# Patient Record
Sex: Female | Born: 1965 | Race: White | Hispanic: No | Smoking: Never smoker
Health system: Southern US, Community
[De-identification: ages and names within clinical notes are randomized; demographics above are authoritative.]

## PROBLEM LIST (undated history)

## (undated) DIAGNOSIS — K760 Fatty (change of) liver, not elsewhere classified: Secondary | ICD-10-CM

## (undated) DIAGNOSIS — R112 Nausea with vomiting, unspecified: Secondary | ICD-10-CM

## (undated) DIAGNOSIS — M545 Low back pain, unspecified: Secondary | ICD-10-CM

## (undated) DIAGNOSIS — R259 Unspecified abnormal involuntary movements: Secondary | ICD-10-CM

## (undated) DIAGNOSIS — G609 Hereditary and idiopathic neuropathy, unspecified: Secondary | ICD-10-CM

## (undated) DIAGNOSIS — Z9889 Other specified postprocedural states: Secondary | ICD-10-CM

## (undated) DIAGNOSIS — M62838 Other muscle spasm: Secondary | ICD-10-CM

## (undated) DIAGNOSIS — I959 Hypotension, unspecified: Secondary | ICD-10-CM

## (undated) DIAGNOSIS — IMO0001 Reserved for inherently not codable concepts without codable children: Secondary | ICD-10-CM

## (undated) DIAGNOSIS — M810 Age-related osteoporosis without current pathological fracture: Secondary | ICD-10-CM

## (undated) DIAGNOSIS — K589 Irritable bowel syndrome without diarrhea: Secondary | ICD-10-CM

## (undated) DIAGNOSIS — I209 Angina pectoris, unspecified: Secondary | ICD-10-CM

## (undated) DIAGNOSIS — J309 Allergic rhinitis, unspecified: Secondary | ICD-10-CM

## (undated) DIAGNOSIS — K219 Gastro-esophageal reflux disease without esophagitis: Secondary | ICD-10-CM

## (undated) DIAGNOSIS — Z8619 Personal history of other infectious and parasitic diseases: Secondary | ICD-10-CM

## (undated) DIAGNOSIS — G459 Transient cerebral ischemic attack, unspecified: Secondary | ICD-10-CM

## (undated) DIAGNOSIS — I739 Peripheral vascular disease, unspecified: Secondary | ICD-10-CM

## (undated) DIAGNOSIS — R209 Unspecified disturbances of skin sensation: Secondary | ICD-10-CM

## (undated) DIAGNOSIS — R0602 Shortness of breath: Secondary | ICD-10-CM

## (undated) DIAGNOSIS — M349 Systemic sclerosis, unspecified: Secondary | ICD-10-CM

## (undated) DIAGNOSIS — M47812 Spondylosis without myelopathy or radiculopathy, cervical region: Secondary | ICD-10-CM

## (undated) DIAGNOSIS — G8929 Other chronic pain: Secondary | ICD-10-CM

## (undated) DIAGNOSIS — I498 Other specified cardiac arrhythmias: Secondary | ICD-10-CM

## (undated) DIAGNOSIS — F450 Somatization disorder: Secondary | ICD-10-CM

## (undated) DIAGNOSIS — R519 Headache, unspecified: Secondary | ICD-10-CM

## (undated) DIAGNOSIS — I201 Angina pectoris with documented spasm: Secondary | ICD-10-CM

## (undated) DIAGNOSIS — R5381 Other malaise: Secondary | ICD-10-CM

## (undated) DIAGNOSIS — R5383 Other fatigue: Secondary | ICD-10-CM

## (undated) DIAGNOSIS — F411 Generalized anxiety disorder: Secondary | ICD-10-CM

## (undated) HISTORY — DX: Low back pain, unspecified: M54.50

## (undated) HISTORY — DX: Somatization disorder: F45.0

## (undated) HISTORY — PX: CHOLECYSTECTOMY: SHX55

## (undated) HISTORY — DX: Gastro-esophageal reflux disease without esophagitis: K21.9

## (undated) HISTORY — DX: Allergic rhinitis, unspecified: J30.9

## (undated) HISTORY — PX: CATARACT EXTRACTION: SUR2

## (undated) HISTORY — PX: CARPAL TUNNEL RELEASE: SHX101

## (undated) HISTORY — DX: Unspecified disturbances of skin sensation: R20.9

## (undated) HISTORY — PX: OOPHORECTOMY: SHX86

## (undated) HISTORY — DX: Unspecified abnormal involuntary movements: R25.9

## (undated) HISTORY — DX: Other muscle spasm: M62.838

## (undated) HISTORY — DX: Low back pain: M54.5

## (undated) HISTORY — DX: Reserved for inherently not codable concepts without codable children: IMO0001

## (undated) HISTORY — PX: TUBAL LIGATION: SHX77

## (undated) HISTORY — PX: COLONOSCOPY: SHX174

## (undated) HISTORY — DX: Angina pectoris with documented spasm: I20.1

## (undated) HISTORY — PX: BACK SURGERY: SHX140

## (undated) HISTORY — DX: Other fatigue: R53.83

## (undated) HISTORY — DX: Generalized anxiety disorder: F41.1

## (undated) HISTORY — DX: Age-related osteoporosis without current pathological fracture: M81.0

## (undated) HISTORY — DX: Irritable bowel syndrome, unspecified: K58.9

## (undated) HISTORY — DX: Other specified cardiac arrhythmias: I49.8

## (undated) HISTORY — DX: Other malaise: R53.81

## (undated) HISTORY — DX: Spondylosis without myelopathy or radiculopathy, cervical region: M47.812

## (undated) HISTORY — DX: Personal history of other infectious and parasitic diseases: Z86.19

## (undated) HISTORY — DX: Hereditary and idiopathic neuropathy, unspecified: G60.9

## (undated) HISTORY — PX: CARDIAC CATHETERIZATION: SHX172

## (undated) HISTORY — DX: Systemic sclerosis, unspecified: M34.9

## (undated) HISTORY — DX: Angina pectoris, unspecified: I20.9

---

## 1995-11-06 HISTORY — PX: ABDOMINAL HYSTERECTOMY: SHX81

## 1998-11-11 ENCOUNTER — Other Ambulatory Visit: Admission: RE | Admit: 1998-11-11 | Discharge: 1998-11-11 | Payer: Self-pay | Admitting: Obstetrics and Gynecology

## 1999-04-18 ENCOUNTER — Encounter: Payer: Self-pay | Admitting: Obstetrics and Gynecology

## 1999-04-18 ENCOUNTER — Ambulatory Visit (HOSPITAL_COMMUNITY): Admission: RE | Admit: 1999-04-18 | Discharge: 1999-04-18 | Payer: Self-pay | Admitting: Obstetrics and Gynecology

## 1999-10-13 ENCOUNTER — Inpatient Hospital Stay (HOSPITAL_COMMUNITY): Admission: RE | Admit: 1999-10-13 | Discharge: 1999-10-15 | Payer: Self-pay | Admitting: Obstetrics and Gynecology

## 1999-10-13 ENCOUNTER — Encounter (INDEPENDENT_AMBULATORY_CARE_PROVIDER_SITE_OTHER): Payer: Self-pay | Admitting: Specialist

## 1999-10-24 ENCOUNTER — Ambulatory Visit (HOSPITAL_COMMUNITY): Admission: RE | Admit: 1999-10-24 | Discharge: 1999-10-24 | Payer: Self-pay | Admitting: Obstetrics and Gynecology

## 1999-10-24 ENCOUNTER — Encounter: Payer: Self-pay | Admitting: Obstetrics and Gynecology

## 1999-10-25 ENCOUNTER — Encounter: Payer: Self-pay | Admitting: Family Medicine

## 1999-10-25 ENCOUNTER — Encounter: Admission: RE | Admit: 1999-10-25 | Discharge: 1999-10-25 | Payer: Self-pay | Admitting: Family Medicine

## 1999-11-28 ENCOUNTER — Ambulatory Visit (HOSPITAL_COMMUNITY): Admission: RE | Admit: 1999-11-28 | Discharge: 1999-11-28 | Payer: Self-pay | Admitting: Obstetrics and Gynecology

## 1999-11-28 ENCOUNTER — Encounter: Payer: Self-pay | Admitting: Obstetrics and Gynecology

## 1999-12-06 ENCOUNTER — Inpatient Hospital Stay (HOSPITAL_COMMUNITY): Admission: RE | Admit: 1999-12-06 | Discharge: 1999-12-08 | Payer: Self-pay | Admitting: Obstetrics and Gynecology

## 1999-12-06 ENCOUNTER — Encounter (INDEPENDENT_AMBULATORY_CARE_PROVIDER_SITE_OTHER): Payer: Self-pay | Admitting: Specialist

## 2000-04-16 ENCOUNTER — Encounter: Admission: RE | Admit: 2000-04-16 | Discharge: 2000-04-16 | Payer: Self-pay | Admitting: Family Medicine

## 2000-04-16 ENCOUNTER — Encounter: Payer: Self-pay | Admitting: Family Medicine

## 2000-05-20 ENCOUNTER — Encounter: Admission: RE | Admit: 2000-05-20 | Discharge: 2000-05-20 | Payer: Self-pay | Admitting: Family Medicine

## 2000-05-20 ENCOUNTER — Encounter: Payer: Self-pay | Admitting: Family Medicine

## 2000-06-07 ENCOUNTER — Emergency Department (HOSPITAL_COMMUNITY): Admission: EM | Admit: 2000-06-07 | Discharge: 2000-06-07 | Payer: Self-pay | Admitting: Emergency Medicine

## 2000-06-14 ENCOUNTER — Encounter: Payer: Self-pay | Admitting: *Deleted

## 2000-06-14 ENCOUNTER — Encounter: Admission: RE | Admit: 2000-06-14 | Discharge: 2000-06-14 | Payer: Self-pay | Admitting: *Deleted

## 2000-06-17 ENCOUNTER — Emergency Department (HOSPITAL_COMMUNITY): Admission: EM | Admit: 2000-06-17 | Discharge: 2000-06-17 | Payer: Self-pay | Admitting: Emergency Medicine

## 2000-07-10 ENCOUNTER — Ambulatory Visit (HOSPITAL_COMMUNITY): Admission: RE | Admit: 2000-07-10 | Discharge: 2000-07-11 | Payer: Self-pay | Admitting: Neurosurgery

## 2000-07-10 ENCOUNTER — Encounter: Payer: Self-pay | Admitting: Neurosurgery

## 2000-11-05 HISTORY — PX: RECTOCELE REPAIR: SHX761

## 2000-11-05 HISTORY — PX: COLONOSCOPY: SHX174

## 2000-11-05 HISTORY — PX: CYSTOCELE REPAIR: SHX163

## 2001-07-03 ENCOUNTER — Encounter (INDEPENDENT_AMBULATORY_CARE_PROVIDER_SITE_OTHER): Payer: Self-pay | Admitting: *Deleted

## 2001-07-03 ENCOUNTER — Ambulatory Visit (HOSPITAL_COMMUNITY): Admission: RE | Admit: 2001-07-03 | Discharge: 2001-07-03 | Payer: Self-pay | Admitting: *Deleted

## 2001-08-01 ENCOUNTER — Ambulatory Visit: Admission: RE | Admit: 2001-08-01 | Discharge: 2001-08-01 | Payer: Self-pay | Admitting: Obstetrics and Gynecology

## 2001-08-08 ENCOUNTER — Ambulatory Visit (HOSPITAL_COMMUNITY): Admission: RE | Admit: 2001-08-08 | Discharge: 2001-08-08 | Payer: Self-pay | Admitting: Obstetrics and Gynecology

## 2001-08-08 ENCOUNTER — Encounter: Payer: Self-pay | Admitting: Obstetrics and Gynecology

## 2001-09-09 ENCOUNTER — Other Ambulatory Visit: Admission: RE | Admit: 2001-09-09 | Discharge: 2001-09-09 | Payer: Self-pay | Admitting: Obstetrics and Gynecology

## 2001-09-25 ENCOUNTER — Ambulatory Visit (HOSPITAL_COMMUNITY): Admission: RE | Admit: 2001-09-25 | Discharge: 2001-09-25 | Payer: Self-pay | Admitting: Neurosurgery

## 2001-09-25 ENCOUNTER — Encounter: Payer: Self-pay | Admitting: Neurosurgery

## 2001-10-16 ENCOUNTER — Inpatient Hospital Stay (HOSPITAL_COMMUNITY): Admission: RE | Admit: 2001-10-16 | Discharge: 2001-10-18 | Payer: Self-pay | Admitting: Obstetrics and Gynecology

## 2001-10-19 ENCOUNTER — Inpatient Hospital Stay (HOSPITAL_COMMUNITY): Admission: AD | Admit: 2001-10-19 | Discharge: 2001-10-19 | Payer: Self-pay | Admitting: Obstetrics and Gynecology

## 2001-10-21 ENCOUNTER — Inpatient Hospital Stay (HOSPITAL_COMMUNITY): Admission: AD | Admit: 2001-10-21 | Discharge: 2001-10-21 | Payer: Self-pay | Admitting: Obstetrics and Gynecology

## 2001-10-25 ENCOUNTER — Inpatient Hospital Stay (HOSPITAL_COMMUNITY): Admission: AD | Admit: 2001-10-25 | Discharge: 2001-10-25 | Payer: Self-pay | Admitting: Obstetrics and Gynecology

## 2002-11-19 ENCOUNTER — Other Ambulatory Visit: Admission: RE | Admit: 2002-11-19 | Discharge: 2002-11-19 | Payer: Self-pay | Admitting: Obstetrics and Gynecology

## 2003-11-06 HISTORY — PX: OTHER SURGICAL HISTORY: SHX169

## 2003-12-16 ENCOUNTER — Other Ambulatory Visit: Admission: RE | Admit: 2003-12-16 | Discharge: 2003-12-16 | Payer: Self-pay | Admitting: Obstetrics and Gynecology

## 2004-12-26 ENCOUNTER — Other Ambulatory Visit: Admission: RE | Admit: 2004-12-26 | Discharge: 2004-12-26 | Payer: Self-pay | Admitting: Obstetrics and Gynecology

## 2005-07-27 ENCOUNTER — Ambulatory Visit (HOSPITAL_COMMUNITY): Admission: RE | Admit: 2005-07-27 | Discharge: 2005-07-27 | Payer: Self-pay | Admitting: Obstetrics and Gynecology

## 2007-05-26 ENCOUNTER — Emergency Department (HOSPITAL_COMMUNITY): Admission: EM | Admit: 2007-05-26 | Discharge: 2007-05-26 | Payer: Self-pay | Admitting: Emergency Medicine

## 2007-06-04 ENCOUNTER — Emergency Department (HOSPITAL_COMMUNITY): Admission: EM | Admit: 2007-06-04 | Discharge: 2007-06-05 | Payer: Self-pay | Admitting: Emergency Medicine

## 2007-09-03 ENCOUNTER — Encounter: Admission: RE | Admit: 2007-09-03 | Discharge: 2007-09-03 | Payer: Self-pay | Admitting: Obstetrics and Gynecology

## 2007-10-25 ENCOUNTER — Emergency Department (HOSPITAL_COMMUNITY): Admission: EM | Admit: 2007-10-25 | Discharge: 2007-10-25 | Payer: Self-pay | Admitting: Emergency Medicine

## 2007-11-06 HISTORY — PX: COLONOSCOPY: SHX174

## 2008-07-26 ENCOUNTER — Encounter: Admission: RE | Admit: 2008-07-26 | Discharge: 2008-07-26 | Payer: Self-pay | Admitting: Otolaryngology

## 2008-08-12 ENCOUNTER — Ambulatory Visit (HOSPITAL_COMMUNITY): Admission: RE | Admit: 2008-08-12 | Discharge: 2008-08-12 | Payer: Self-pay | Admitting: Obstetrics and Gynecology

## 2008-08-23 ENCOUNTER — Encounter: Payer: Self-pay | Admitting: Gastroenterology

## 2008-08-26 ENCOUNTER — Ambulatory Visit: Payer: Self-pay | Admitting: Gastroenterology

## 2008-08-26 DIAGNOSIS — K625 Hemorrhage of anus and rectum: Secondary | ICD-10-CM

## 2008-08-26 DIAGNOSIS — D352 Benign neoplasm of pituitary gland: Secondary | ICD-10-CM

## 2008-08-26 DIAGNOSIS — N390 Urinary tract infection, site not specified: Secondary | ICD-10-CM

## 2008-08-26 DIAGNOSIS — D353 Benign neoplasm of craniopharyngeal duct: Secondary | ICD-10-CM

## 2008-08-26 LAB — CONVERTED CEMR LAB
Albumin: 4.3 g/dL (ref 3.5–5.2)
Basophils Absolute: 0 10*3/uL (ref 0.0–0.1)
Basophils Relative: 0.3 % (ref 0.0–3.0)
Bilirubin, Direct: 0.1 mg/dL (ref 0.0–0.3)
Calcium: 9.5 mg/dL (ref 8.4–10.5)
Creatinine, Ser: 0.8 mg/dL (ref 0.4–1.2)
Eosinophils Relative: 4.9 % (ref 0.0–5.0)
Ferritin: 88.9 ng/mL (ref 10.0–291.0)
Folate: >20 ng/mL
HCT: 41.9 % (ref 36.0–46.0)
Iron: 99 ug/dL (ref 42–145)
MCHC: 34.3 g/dL (ref 30.0–36.0)
MCV: 90.5 fL (ref 78.0–100.0)
Monocytes Relative: 9.1 % (ref 3.0–12.0)
Potassium: 3.9 meq/L (ref 3.5–5.1)
RDW: 12 % (ref 11.5–14.6)
Saturation Ratios: 30.3 % (ref 20.0–50.0)
Sed Rate: 10 mm/hr (ref 0–22)
Sodium: 142 meq/L (ref 135–145)
Transferrin: 233.1 mg/dL (ref >=212.0)
Vitamin B-12: 344 pg/mL (ref 211–911)

## 2008-08-27 ENCOUNTER — Ambulatory Visit: Payer: Self-pay | Admitting: Gastroenterology

## 2008-09-01 ENCOUNTER — Telehealth: Payer: Self-pay | Admitting: Gastroenterology

## 2008-11-05 HISTORY — PX: PITUITARY EXCISION: SHX745

## 2008-11-12 ENCOUNTER — Inpatient Hospital Stay (HOSPITAL_COMMUNITY): Admission: RE | Admit: 2008-11-12 | Discharge: 2008-11-15 | Payer: Self-pay | Admitting: Neurosurgery

## 2009-11-05 HISTORY — PX: BLADDER SURGERY: SHX569

## 2009-11-23 ENCOUNTER — Ambulatory Visit (HOSPITAL_COMMUNITY): Admission: RE | Admit: 2009-11-23 | Discharge: 2009-11-24 | Payer: Self-pay | Admitting: Specialist

## 2010-01-18 ENCOUNTER — Encounter: Admission: RE | Admit: 2010-01-18 | Discharge: 2010-01-18 | Payer: Self-pay | Admitting: Endocrinology

## 2010-03-29 ENCOUNTER — Encounter: Admission: RE | Admit: 2010-03-29 | Discharge: 2010-03-29 | Payer: Self-pay | Admitting: Neurosurgery

## 2010-08-02 ENCOUNTER — Encounter: Payer: Self-pay | Admitting: Cardiology

## 2010-08-14 ENCOUNTER — Encounter: Payer: Self-pay | Admitting: Cardiology

## 2010-08-16 ENCOUNTER — Encounter: Payer: Self-pay | Admitting: Cardiology

## 2010-09-04 ENCOUNTER — Encounter: Payer: Self-pay | Admitting: Cardiology

## 2010-09-06 ENCOUNTER — Emergency Department (HOSPITAL_COMMUNITY): Admission: EM | Admit: 2010-09-06 | Discharge: 2010-09-06 | Payer: Self-pay | Admitting: Emergency Medicine

## 2010-09-10 ENCOUNTER — Encounter: Admission: RE | Admit: 2010-09-10 | Discharge: 2010-09-10 | Payer: Self-pay | Admitting: Endocrinology

## 2010-10-11 ENCOUNTER — Encounter
Admission: RE | Admit: 2010-10-11 | Discharge: 2010-10-11 | Payer: Self-pay | Source: Home / Self Care | Admitting: Physical Therapy

## 2010-11-05 HISTORY — PX: CRANIECTOMY SUBOCCIPITAL W/ CERVICAL LAMINECTOMY / CHIARI: SUR327

## 2010-11-05 LAB — HM PAP SMEAR

## 2010-11-26 ENCOUNTER — Encounter: Payer: Self-pay | Admitting: Obstetrics and Gynecology

## 2010-11-26 ENCOUNTER — Encounter: Payer: Self-pay | Admitting: Endocrinology

## 2011-01-16 LAB — POCT CARDIAC MARKERS
CKMB, poc: <1 ng/mL — ABNORMAL LOW (ref 1.0–8.0)
Troponin i, poc: <0.05 ng/mL (ref 0.00–0.09)

## 2011-01-22 LAB — COMPREHENSIVE METABOLIC PANEL
ALT: 29 U/L (ref 0–35)
AST: 32 U/L (ref 0–37)
Albumin: 4.2 g/dL (ref 3.5–5.2)
Calcium: 9.8 mg/dL (ref 8.4–10.5)
Creatinine, Ser: 0.82 mg/dL (ref 0.4–1.2)
GFR calc non Af Amer: >60 mL/min (ref >60)
Total Bilirubin: 0.4 mg/dL (ref 0.3–1.2)

## 2011-01-22 LAB — POCT I-STAT 4, (NA,K, GLUC, HGB,HCT)
Hemoglobin: 13.6 g/dL (ref 12.0–15.0)
Potassium: 4.6 mEq/L (ref 3.5–5.1)
Sodium: 141 mEq/L (ref 135–145)

## 2011-02-19 LAB — DIFFERENTIAL
Lymphocytes Relative: 32 % (ref 12–46)
Monocytes Relative: 8 % (ref 3–12)
Neutrophils Relative %: 55 % (ref 43–77)

## 2011-02-19 LAB — CBC
Hemoglobin: 15 g/dL (ref 12.0–15.0)
Platelets: 321 10*3/uL (ref 150–400)
RBC: 4.99 MIL/uL (ref 3.87–5.11)
RDW: 12.6 % (ref 11.5–15.5)

## 2011-02-19 LAB — TYPE AND SCREEN
ABO/RH(D): A POS
Antibody Screen: NEGATIVE

## 2011-03-05 ENCOUNTER — Encounter: Payer: Self-pay | Admitting: Cardiology

## 2011-03-20 NOTE — Op Note (Signed)
NAMERUHEE, ENCK               ACCOUNT NO.:  [id number]   MEDICAL RECORD NO.:  [id number]          PATIENT TYPE:  INP   LOCATION:  3112                         FACILITY:  [facility]   PHYSICIAN:  Kathaleen Maser. Pool, M.D.    DATE OF BIRTH:  [birth year]   DATE OF PROCEDURE:  11/12/2008  DATE OF DISCHARGE:                               OPERATIVE REPORT   SERVICE:  Neurosurgery.   PREOPERATIVE DIAGNOSIS:  Chiari type 1 malformation.   POSTOPERATIVE DIAGNOSIS:  Chiari type 1 malformation.   PROCEDURE NAME:  Chiari decompression with suboccipital craniectomy and  C1 laminectomy with dural patch grafting.  Microdissection.   SURGEON:  Kathaleen Maser. Pool, MD   ASSISTANT:  Tia Alert, MD   ANESTHESIA:  General endotracheal.   INDICATION:  Ms. Ducey is a 45 year old female with history of headaches  and facial pain exacerbated by coughing, sneezing, or straining.  The  patient has some intermittent cranial nerve symptoms.  Workup has  demonstrated evidence of a radiologically significant Chiari I  malformation.  There is no evidence of syringobulbia or syringomyelia,  however.  The patient has no evidence of hydrocephalus.  The patient's  symptoms have been longstanding and debilitating.  She wishes for  something to be done.  We had a long discussion.  She decided to proceed  with the Chiari decompression, hopes improving her symptoms.   OPERATIVE NOTE:  The patient was placed in an operative room table in a  supine position.  After adequate level of anesthesia achieved, the  patient was then prone onto bolsters with her head fixed in a flexed  position in a Mayfield pin headrest.  The patient's posterior scalp and  upper cervical regions were prepped and draped sterilely.  A #10-blade  used to make a linear skin incision in her suboccipital region and upper  cervical region in the midline.  This was carried down sharply to the  suboccipital bone and the C1 lamina was also dissected free.   Deep self-  retainer was placed.  Suboccipital craniectomy was then performed using  the high-speed drill, Kerrison rongeurs, and Leksell rongeurs to remove  a section of the suboccipital bone extending down into the foramen  magnum.  A C1 laminectomy was also performed.  The dura was then opened  first in arachnoid sparing manner.  The dura was opened in a Y-shape  over both cerebellar hemispheres.  The arachnoid was then opened.  The  dura was held apart with tenting sutures.  Microdissection was then  performed exploring the inner cerebellar hemispheric space.  The floor  of the fourth ventricle and obex were identified.  There is no evidence  of any compression or abnormality.  Attention was then placed to sewing  in a dural patch graft.  A piece of bovine pericardium was cut and  fashioned to appropriate size.  This was then sutured in place using 4-0  Nurolon sutures in a running fashion.  Watertight closure was achieved.  Tisseel fibrin glue seal was then placed over the suture repair.  Gelfoam was placed over the  craniectomy  and laminectomy defect.  Wound was then closed in layers  with Vicryl sutures.  Skin was reapproximated with 3-0 nylon suture in a  running interlocking vertical mattress fashion.  There were no apparent  complications, the patient is well and she returns to recovery room.           ______________________________  Kathaleen Maser Pool, M.D.     HAP/MEDQ  D:  11/12/2008  T:  11/13/2008  Job:  [id number]

## 2011-03-23 NOTE — Op Note (Signed)
[facility]  Patient:    Virginia Shaw, Virginia Shaw I Visit Number: [id number] MRN: [id number]          Service Type: GYN Location: 9300 [id number] Attending Physician:  Soledad Gerlach Dictated by:   Guy Sandifer Arleta Creek, M.D. Proc. Date: 10/16/01 Admit Date:  10/16/2001                             Operative Report  PREOPERATIVE DIAGNOSIS:       Cystocele and rectocele.  POSTOPERATIVE DIAGNOSIS:      Cystocele and rectocele.  OPERATION:                    Anterior and posterior vaginal repair.  SURGEON:                      Guy Sandifer. Arleta Creek, M.D.  ASSISTANT:                    Marcelle Overlie, M.D.  ANESTHESIA:                   Spinal.  ESTIMATED BLOOD LOSS:         200 cc.  INDICATIONS AND CONSENT:      The patient is a 45 year old married white female status post TAH and subsequent BSO with symptomatic cystocele and rectocele.  Details have been dictated in the History and Physical.  Anterior and posterior vaginal repair has been discussed with the patient.  Potential risks and complications have been discussed including, but not limited to infection, bowel, bladder, or ureteral damage, bleeding requiring transfusion of blood products, and possible transfusion reaction of HIV and hepatitis acquisition, DVT, PE, pneumonia, fistula formation, postoperative dyspareunia, vaginal narrowing.  Questions have been answered and consent signed on the chart.  DESCRIPTION OF PROCEDURE:     The patient was taken to the operating room where a spinal anesthetic was placed.  She was then placed in the dorsolithotomy position where she was prepped, the bladder was straight catheterized, and she was draped in a sterile fashion.  The vaginal cuff was then grasped at each corner with Allis clamps and an inverted T-shaped incision was made to begin the anterior repair.  This was then carried up in the midline to a point 1-2 cm below the urethral meatus, and  the vaginal mucosa is then dissected bilaterally, sharply, and bluntly.  The urethovesical angle was then supported with Kelly plication sutures using 0 Monocryl.  Then, using a 0 Monocryl, a pursestring suture was used to reduce the cystocele, and then interrupted sutures are used to plicate the vesicovaginal fascia.  Excess mucosa is trimmed.  Figure-of-eight Vicryls are then used to reapproximate the superior aspect of the mucosa.  Then, a 2-0 running locking suture is used to close the remainder of the mucosa.  Next, a diamond-shaped wedge of tissue was removed from the posterior peritoneal body and the superficial fascia.  The posterior vaginal mucosa was then dissected from the underlying rectum in the midline and then carried out bilaterally sharply and bluntly.  The rectovaginal fascia is then reapproximated in the midline with interrupted sutures.  Excess mucosa was trimmed and the mucosa is reapproximated with a running locking 2-0 suture down to the point of the introitus.  The peritoneal body was then dissected and reapproximated with interrupted 0 sutures.  The 2-0 running locking stitch  was then used to continue closure of the mucosa, and then the skin of the peritoneum.  The vagina admits two examining fingers at the end of the case. A Foley catheter was then placed in the bladder and approximately 400 cc of clear urine is obtained.  The bladder is then filled retrograde with approximately 350 cc of saline.  Then, a suprapubic Bonnano catheter was placed using a syringe and a positive withdraw of saline both with and without the stylet.  This was then sutured in place with a permanent suture while the bladder was drained.  The Foley catheter was then removed.  One inch Iodoform gauze was then placed as a vaginal pack.  All counts were correct.  The patient was awakened and taken to the recovery room in stable condition. Dictated by:   Guy Sandifer Arleta Creek, M.D. Attending  Physician:  Soledad Gerlach DD:  10/16/01 TD:  10/16/01 Job: [id number]

## 2011-03-23 NOTE — H&P (Signed)
[facility]  Patient:    Virginia Shaw, Virginia Shaw Visit Number: [id number] MRN: [id number]          Service Type: Attending:  Guy Sandifer. Arleta Creek, M.D. Dictated by:   Guy Sandifer Arleta Creek, M.D. Adm. Date:  08/07/01                           History and Physical  CHIEF COMPLAINT:              Symptomatic rectocele and cystocele.  HISTORY OF PRESENT ILLNESS:   This patient is a 45 year old, white married female, status post TAH and subsequent BSO who complains of increasing difficulty with bowel movements. She has significant vaginal pressure making it difficult to empty her bowels. On examination, she has a small cystocele and a second degree rectocele. After attempts at conservative management, the patient requests surgical correction. She is being admitted for an anterior-posterior vaginal repair. Potential risks of the surgery have been reviewed with the patient and her husband, and all questions have been answered.  PAST MEDICAL HISTORY:         1. 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 Apparently, this is a                                  nonsecreting tumor with a normal prolactin                                  level of 7.                               2. Headaches and complaints of blurry vision                                  with normal ophthalmologic examination. The                                  patient is also seeing Zola Button T. Lazarus Salines, M.D.                                  for ENT evaluation who believes she has                                  allergic rhinitis controlled with Allegra.                               3. Chronic sinusitis. Gloris Manchester. [facility], M.D. as                                  above.  4. Constipation prone, irritable bowel syndrome                                  with Roosvelt Harps, M.D. She has chronic  constipation problems and chronic complaints                                  of abdominal bloating. Apparently, a negative                                  workup to date.                               5. Microscopic hematuria with normal IVP and                                  cystoscopy with Bertram Millard. Dahlstedt, M.D.  PAST SURGICAL HISTORY:        1. Status post TAH in 1997.                               2. 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                               3. Subsequent laparotomy with LSO in January of                                  2000.                               4. Diskectomy at L5 with Hewitt Shorts,                                  M.D. last year.  OBSTETRIC HISTORY:            Vaginal delivery x 2.  FAMILY HISTORY:               Multiple gestations in maternal grandmother. Chronic hypertension in father. Pulmonary disease paternal grandfather. Migraine headaches in sister.  CURRENT MEDICATIONS:          Dulcolax tablets.  ALLERGIES:                    CODEINE leading to rash.  REVIEW OF SYSTEMS:            The patient complains of pain in her extremities. She particularly complains of pain in her left calf. Doppler flow studies are pending at the time of dictation.  PHYSICAL EXAMINATION:  VITAL SIGNS:  Height 5 feet 1 inch, weight 121 pounds, blood pressure 96/64.  HEENT/NECK:                   Without thyromegaly.  LUNGS:                        Clear to auscultation.  HEART:                        Regular rate and rhythm.  BACK:                         Without CVA tenderness.  BREASTS:                      Without mass, tracks, or discharge.  ABDOMEN:                      Soft, nontender, without masses.  PELVIC:                       Vulva and vagina without lesion. First degree cystocele, second degree rectocele noted. No rectal masses.  Good sphincter tone.  EXTREMITIES:                  Mildly tender over the left calf. Right lower extremity nontender. No cords bilaterally. Negative Homans sign bilaterally. Normal dorsalis pedis and posterior tibial pulse in the left foot.  NEUROLOGICAL:                 Grossly within normal limits.  ASSESSMENT:                   Symptomatic cystocele, rectocele.  PLAN:                         Anterior-posterior vaginal repair with Guy Sandifer. Arleta Creek, M.D. Dictated by:   Guy Sandifer Arleta Creek, M.D. Attending:  Guy Sandifer Arleta Creek, M.D. DD:  07/31/01 TD:  07/31/01 Job: [id number]

## 2011-03-23 NOTE — Discharge Summary (Signed)
[facility]  Patient:    Virginia Shaw, Virginia Shaw I Visit Number: [id number] MRN: [id number]          Service Type: GYN Location: 9300 [id number] Attending Physician:  Soledad Gerlach Dictated by:   Guy Sandifer Arleta Creek, M.D. Admit Date:  10/16/2001 Disc. Date: 10/18/01                             Discharge Summary  ADMISSION DIAGNOSES:          1. Cystocele.                               2. Rectocele.  DISCHARGE DIAGNOSES:          1. Cystocele.                               2. Rectocele.  PROCEDURE:                    On October 16, 2001, anterior and posterior vaginal repair.  REASON FOR ADMISSION:         This patient is a 45 year old married white female, status post TAH and subsequent BSO with a symptomatic cystocele and rectocele.  Details are dictated in history and physical.  She is admitted for surgical management.  HOSPITAL COURSE:              She is admitted to the hospital and undergoes the above procedure without complications.  Estimated blood loss is 200 cc. On the evening of surgery, she has good pain control.  She had had some nausea and vomiting three times during the day, but it was resolved that evening. She was hungry.  Vital signs were stable and she was afebrile.  Urine output was clear.  Her examination was within normal limits.  The following morning, she noted she had a bowel movement three times the previous day.  However, she complained of vaginal fullness and requested a laxative.  No further nausea and vomiting.  She continued to have good pain relief and was up in a chair. She had not yet had a regular diet at that point.  Vital signs remained stable and she was afebrile.  Hemoglobin is 12.6.  Her vaginal packing was removed and her examination was within normal limits.  Possible discharge was discussed and instructions were given.  By the evening of October 17, 2001, she was noted to be ambulating and tolerating  regular diet.  She had complained of a lot of rectal pressure and had requested a laxative.  She was given magnesium citrate which produced good results. That did decrease the rectal pressure somewhat.  She complained of a lot of vulvar burning with the diarrhea.  Vital signs remained stable and she was afebrile.  Examination revealed the vulva to be healing well.  On the day of discharge, she is feeling much better with Dermaplast spray.  She is voiding only small amounts. She is tolerating a regular diet and ambulating well.  She has only scant vaginal bleeding.  Vital signs remain stable and she is afebrile.  CONDITION ON DISCHARGE:       Good.  DIET:  Regular as tolerated.  ACTIVITY:                     No lifting, no operation of automobiles, no vaginal entry.  She is to call the office for problems including, but not limited to, temperature of 101 or greater, increasingly heavier vaginal bleeding, increasing pain, persistent nausea and vomiting.  DISCHARGE MEDICATIONS:        1. Macrobid #10 one refill one p.o. b.i.d. while                                  catheter is in place.                               2. Phenergan 25 mg tablets #20 one p.o. q.6h.                                  p.r.n. with no refills.                               3. Percocet 5/325 mg #30 one to two p.o. q.6h.                                  p.r.n.                               4. Ibuprofen 600 mg p.o. q.6h. p.r.n.  LABORATORY DATA:              On October 15, 2001, WBC 6.2, hemoglobin 14.4, platelets 306,000.  On October 17, 2001, white count 7.1, hemoglobin 12.6, platelets 281,000.  A urine culture was sent in the early a.m. of October 18, 2001, for some complaints of burning.  The patient will be on Macrobid and the urine culture will be checked.  FOLLOW-UP:                    In the office in one week.  Careful instructions have been given on care of the suprapubic  Bonanno catheter. Dictated by:   Guy Sandifer Arleta Creek, M.D. Attending Physician:  Soledad Gerlach DD:  10/18/01 TD:  10/18/01 Job: [id number]

## 2011-03-23 NOTE — H&P (Signed)
[facility]  Patient:    Virginia Shaw, Virginia Shaw Visit Number: [id number] MRN: [id number]          Service Type: Attending:  Guy Sandifer. Arleta Creek, M.D. Dictated by:   Guy Sandifer Arleta Creek, M.D. Adm. Date:  10/16/01                           History and Physical  CHIEF COMPLAINT:              Symptomatic rectocele and cystocele.  HISTORY OF PRESENT ILLNESS:   This patient is a 45 year old married white female, status post TAH and subsequent BSO, with symptomatic cystocele and rectocele.  She was originally scheduled for surgery on August 07, 2001.  At that time, she complained of a Fleets enema escaping through the vagina while in the preoperative holding area.  Therefore, her surgery was canceled to investigate the possibility of a rectovaginal fistula.  Evaluation has included a pelvic CT which was negative for evidence of fistula.  The patient also underwent colposcopy in the office, which was also unable to identify a rectovaginal fistula.  Finally, rectovaginal examination does not reveal evidence of a rectovaginal fistula.  However, the patient continues to complain of increasing difficulty with her bowel movements.  She has significant vaginal pressure when trying to empty her bowels.  She uses Dulcolax tablets on a daily basis.  After attempts at conservative management, the patient requests surgical correction.  She is being admitted for anterior posterior vaginal repair.  PAST MEDICAL HISTORY:         1.  Pituitary microadenoma, evaluated by                                   Dr. Shirlean Kelly.  Apparently, this is a                                   nonsecreting tumor with a normal prolactin                                   level of 7.                               2.  Headaches and complaints of blurry vision,                                   with normal ophthalmologic examination.  The                                   patient sees Dr. Zola Button  T. [facility] for ENT                                   evaluation, who is treating it as allergic  rhinitis.                               3.  Chronic sinusitis, treated by Dr. Lazarus Salines.                               4.  Constipation prone with irritable bowel                                   syndrome, treated by Dr. Roosvelt Harps.                                   She has numerous complaints of abdominal                                   bloating with an apparent negative GI workup                                   to date.                               5.  Microscopic hematuria with normal IVP and                                   cystoscopy with Dr. Marcine Matar.  PAST SURGICAL HISTORY:        1.  Total abdominal hysterectomy in 1997.                               2.  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                               3.  Subsequent laparotomy with LSO in                                   January 2000.                               4.  Diskectomy at L5 with Dr. Shirlean Kelly                                   last year.  OBSTETRIC HISTORY:            Vaginal delivery x 2.  FAMILY HISTORY:               Multiple gestations in maternal grandmother, chronic hypertension in father,  pulmonary disease in paternal grandfather, migraine headaches in sister.  MEDICATIONS:                  Dulcolax suppository daily.  ALLERGIES:                    1.  CODEINE leading to tachycardia and feeling                                   bad.                               2.  CIPRO leading to headache and tachycardia.  REVIEW OF SYSTEMS:            Prior to her last scheduled admission of August 07, 2001, the patient was complaining of pain in the left calf. Doppler flow study on August 01, 2001, of the left leg was within normal limits.  Patient subsequently has not  complained of this to me.  PHYSICAL EXAMINATION:  VITAL SIGNS:                  Height 5 feet 1 inch, weight 121 pounds.  Blood pressure 106/62.  HEENT:                        Without thyromegaly.  LUNGS:                        Clear to auscultation.  HEART:                        Regular rate and rhythm.  BACK:                         Without CVA tenderness.  BREASTS:                      Without mass, retractions, or discharge.  ABDOMEN:                      Soft, nontender, without masses.  PELVIC EXAM:                  Vulva and vagina without lesions.  There was a first degree cystocele and second degree rectocele.  RECTAL EXAM:                  No rectal masses.  Good sphincter tone of the rectum.  EXTREMITIES:                  Nontender, without cords and negative Homans sign bilaterally.  NEUROLOGICAL:                 Grossly within normal limits.  ASSESSMENT:                   Symptomatic cystocele and rectocele.  PLAN:                         Anterior-posterior vaginal repair. Dictated by:   Guy Sandifer Arleta Creek, M.D. Attending:  Guy Sandifer Arleta Creek, M.D. DD:  10/15/01 TD:  10/15/01 Job: [id number]

## 2011-03-23 NOTE — Op Note (Signed)
[place]  Patient:    Virginia Shaw, Virginia Shaw                      MRN: [id number] Proc. Date: 07/10/00 Adm. Date:  [id number] Attending:  Barton Fanny Dictator:   Hewitt Shorts, M.D.                           Operative Report  PREOPERATIVE DIAGNOSIS: Right alpha S1 lumbar disc herniation.  POSTOPERATIVE DIAGNOSIS: Same.  PROCEDURE:  Right alpha S1 lumbar laminotomy and right discectomy with microdissection.  SURGEON:  Hewitt Shorts, M.D.  ASSISTANT:  None.  ANESTHESIA: General endotracheal.  INDICATIONS:  Patient is a 45 year old woman who presented with an acute right lumbar radiculopathy and was found by MRI to have a large right L5-S1 lumbar disc herniation.  The decision was made to proceed with elective laminotomy and microdiskectomy.  PROCEDURE:  Patient was brought to the operating room, ______.  Patient was turned to the prone position and the lumbar region was prepped with ______ solution and draped in a sterile fashion.  Localization was taken and the L5-S1 level identified and midline ______ with epinephrine and midline incisions were made, carried down through the subcutaneous tissue.  Bipolar electrocautery were used to maintain hemostasis.  Dissection was carried down to the lumbar fascia, which was ______ of the midline ______ subperiosteal fashion.  The L5-S1 intralaminar space was identified and x-ray was taken to confirm the localization.  Then the laminotomy was performed using ______ and the ligamentum flavum was removed and we were able to identify the thecal sac and right S1 nerve roots.  These were gently retracted medially exposing a large disc herniation.  Overlying veins were coagulated and incised, then the free fragment was mobilized and removed and good decompression of the thecal sac and nerve root was achieved.  We then examined the disc, which was degenerated, but there was no further bulging  or compression.  There was no obvious rent in the annulus and therefore, it was felt that no further discectomy would benefit the patient.  The fragment had been removed and good decompression achieved.  We then established hemostasis with the use of bipolar cautery and then proceeded with closure.  Prior to closure, 2cc of Fentanyl and 80 mg of ______ were infused into the epidural space.  We then closed the deep fascia with interrupted ______ sutures.  The subcutaneous tissue were closed with interrupted inverted ______.  Patient tolerated the procedure well.  The estimated blood loss was less than 25cc.  ______ were correct.  Following surgery, the patient was to turned back to supine position ______ and transferred to the recovery room for further care. DD:  07/10/00 TD:  07/10/00 Job: [id number]

## 2011-03-23 NOTE — Discharge Summary (Signed)
Virginia Shaw, Virginia Shaw               ACCOUNT NO.:  [id number]   MEDICAL RECORD NO.:  [id number]          PATIENT TYPE:  INP   LOCATION:  3001                         FACILITY:  [facility]   PHYSICIAN:  Kathaleen Maser. Pool, M.D.    DATE OF BIRTH:  Mar 28, 1966   DATE OF ADMISSION:  11/12/2008  DATE OF DISCHARGE:  11/15/2008                               DISCHARGE SUMMARY   FINAL DIAGNOSIS:  Type 1 Chiari malformation.   HISTORY OF PRESENT ILLNESS:  [name] is a 45 year old female with a  symptomatic type 1 Chiari malformation.  She presents now for  decompression.   HOSPITAL COURSE:  The patient taken to operating room where an  uncomplicated Chiari decompression was performed.  Postoperatively, the  patient did well.  Headaches and facial symptoms had resolved.  Wound  was healing well.  The patient was rapidly mobilized.   CONDITION AT DISCHARGE:  Improved.   DISCHARGE FOLLOWUP:  The patient will follow up in my office in 1 week.           ______________________________  Kathaleen Maser. Pool, M.D.     HAP/MEDQ  D:  01/04/2009  T:  01/05/2009  Job:  [id number]

## 2011-03-23 NOTE — H&P (Signed)
[place]. [facility]  Patient:    Virginia Shaw, Virginia Shaw                      MRN: [id number] Adm. Date:  [id number] Attending:  Barton Fanny                         History and Physical  HISTORY OF PRESENT ILLNESS:  The patient is a 45 year old, right-handed white female who was evaluated for right lumbar radiculopathy secondary to a central right L5-S1 disk herniation.  The patient began to have difficulty with low back pain in October 2000.  She underwent a number of pelvic surgeries, first undergoing a right oophorectomy for a cyst and benign tumor, and then a left oophorectomy for a cyst.  It was suspected that her low back pain might be arising from these difficulties, but subsequent to her surgeries, she continued to have low back pain, and the pain worsened over the past couple of months, with pain beginning to radiate down to the right buttock, posterior thigh, and into the groin.  She does not describe specific numbness or tingling in a radicular pattern, but instead describes numbness and tingling in her arms, legs, and around her mouth, including her lips.  She also describes some chronic discomfort in her lower abdomen and pelvic region.  She describes discomfort and pressure, and is currently undergoing an evaluation with a couple of gynecologists.  She does not describe any history of weakness, although she finds it hard to use her right lower extremity due to pain and discomfort, such as even with standing.  An MRI scan was done which revealed a moderately-large central to right L5-S1 disk herniation.  PAST MEDICAL HISTORY:  She does not describe any history of hypertension, myocardial infarction, cancer, stroke, diabetes mellitus, peptic ulcer disease, or lung disease.  PAST SURGICAL HISTORY: 1. Hysterectomy in 1997. 2. Right oophorectomy in December 2000. 3. Left oophorectomy in January 2001.  ALLERGIES:  CODEINE, which causes her  to break out.  CURRENT MEDICATIONS:  Pain pills.  FAMILY HISTORY:  Parents are both in good health.  Her father is age 61, with hypertension.  Mother is age 33, without any significant medical difficulties.  SOCIAL HISTORY:  The patient is married.  She is a Futures trader.  She has children ages 42 and 90.  She does not smoke, drink alcoholic beverages, or have a history of substance abuse.  REVIEW OF SYSTEMS:  Notable for those difficulties as described in the history of present illness and past medical history.  She also notes that in addition to the chronic back pain, she has had some difficulties with depression.  She was once told that she has borderline abnormal thyroid function, but does not describe any abnormalities in the review of systems.  PHYSICAL EXAMINATION:  GENERAL:  The patient is a well-developed, well-nourished white female, in obvious discomfort.  Her mobility is limited when she is changing position.  VITAL SIGNS:  Temperature 97.1 degrees, pulse 82, blood pressure 119/71, respirations 18.  Height 5 feet 1 inches, weight 122 pounds.  LUNGS:  Clear to auscultation.  She has symmetrical respiratory excursion.  HEART:  Regular rate and rhythm.  Normal S1, S2.  No murmur.  ABDOMEN:  Nondistended.  Bowel sounds present.  EXTREMITIES:  No cyanosis, clubbing, or edema.  MUSCULOSKELETAL:  Examination shows mild tenderness to palpation in the lower lumbar region as well as  in the right paralumbar region.  None in the left paralumbar region.  Mobility is significantly limited on flexion and extension.  She is able to flex to at most 20 degrees and is limited to extension to 5-10 degrees.  She has a positive straight leg raising on the right at about 20 degrees.  NEUROLOGIC:  Shows 5/5 strength in the lower extremities, although she has difficulty with ______ for the left, with the right lower extremity due to pain.  Sensory examination has decreased sensation to  pinprick in the lateral aspect of the distal right leg and the lateral aspect of the right ankle. Reflexes are 2 at the quadriceps, 1 at the gastrocnemii, symmetrical bilaterally.  Toes are downgoing bilaterally.  DIAGNOSTIC STUDIES:  MRI of the lumbar spine show desiccation of the disks at both L4-5 and L5-S1, with minimal central disk bulging at L4-5, but at L5-S1 there is a significant central to right L5-S1 lumbar disk herniation, with nerve root and thecal sac compression.  IMPRESSION:  A patient with right lumbar radiculopathy, secondary to a central to right L5-S1 lumbar disk herniation, superimposed on degenerative disk disease, both at that level as well as at L4-5.  She also has chronic lower abdominal pelvic discomfort which is undergoing evaluation and management by her gynecologists.  PLAN:  The patient will be admitted for a right L5-S1 lumbar laminotomy and microdiskectomy.  We discussed the nature of the surgery, the alternatives to surgery, typical length of surgery, hospital stay, and overall recuperation. The limitations of surgery, the postoperative period, and the risks of surgery, including the risks of infection, bleeding, possible need for transfusion, risks of nerve root dysfunction with pain, weakness, numbness, or paresthesias.  The risks of recurrent disk herniation, possible need for further surgery, and the anesthetic risks of myocardial infarction, stroke, pneumonia, and death.  Understanding all of this, the patient does wish to proceed with surgery, and is admitted for such. DD:  07/10/00 TD:  07/10/00 Job: [id number]

## 2011-04-23 ENCOUNTER — Encounter: Payer: Self-pay | Admitting: Cardiology

## 2011-08-10 LAB — URINALYSIS, ROUTINE W REFLEX MICROSCOPIC
Bilirubin Urine: NEGATIVE
Ketones, ur: NEGATIVE
Specific Gravity, Urine: 1.013
pH: 6.5

## 2011-08-10 LAB — URINE MICROSCOPIC-ADD ON

## 2011-08-20 LAB — DIFFERENTIAL
Basophils Absolute: 0
Basophils Relative: 0
Eosinophils Absolute: 0.3
Eosinophils Relative: 3
Monocytes Absolute: 0.9 — ABNORMAL HIGH
Neutro Abs: 7

## 2011-08-20 LAB — I-STAT 8, (EC8 V) (CONVERTED LAB)
Acid-Base Excess: 4 — ABNORMAL HIGH
BUN: 15
Bicarbonate: 28.8 — ABNORMAL HIGH
Chloride: 103
HCT: 47 — ABNORMAL HIGH
Hemoglobin: 16 — ABNORMAL HIGH
Operator id: 146091
Sodium: 135

## 2011-08-20 LAB — CBC
HCT: 43.3
Hemoglobin: 15.1 — ABNORMAL HIGH
MCHC: 34.8
MCV: 87.4
RDW: 12.7

## 2011-08-20 LAB — POCT I-STAT CREATININE: Creatinine, Ser: 0.8

## 2011-11-06 HISTORY — PX: CERVICAL SPINE SURGERY: SHX589

## 2011-11-06 HISTORY — PX: OTHER SURGICAL HISTORY: SHX169

## 2011-12-05 ENCOUNTER — Encounter: Payer: Self-pay | Admitting: Cardiology

## 2012-03-25 ENCOUNTER — Encounter: Payer: Self-pay | Admitting: Cardiology

## 2012-03-25 ENCOUNTER — Ambulatory Visit (INDEPENDENT_AMBULATORY_CARE_PROVIDER_SITE_OTHER): Payer: Self-pay | Admitting: Cardiology

## 2012-03-25 DIAGNOSIS — R079 Chest pain, unspecified: Secondary | ICD-10-CM

## 2012-03-25 DIAGNOSIS — R0789 Other chest pain: Secondary | ICD-10-CM | POA: Insufficient documentation

## 2012-03-25 NOTE — Assessment & Plan Note (Addendum)
At this point I would like to receive the records from [facility] to confirm previous workup. She seems to be having a stable chest pain pattern and on appropriate therapy. I would not think further invasive workup was needed. We could adjust her medications further if she has increasing symptoms. I did review available records from her primary provider.  She has signed a release of information.

## 2012-03-25 NOTE — Progress Notes (Signed)
HPI The patient presents for evaluation of chest pain. She has been diagnosed apparently with coronary vasospasm. She reports having 2 catheterizations around early 2012 at [facility].  She was told she had coronary vasospasm. It was suggested that she have another procedure at [facility] but she did not want to do this rather wanted a second opinion.  She says she will have discomfort a couple of times per week. She will take nitroglycerin and his discomfort will go away. He typically responds to one nitroglycerin sometimes requiring 2. It is a discomfort under her left breast and epigastric area. She does not describe associated symptoms. He comes on with cold weather or drinking cold or eating cold foods. She cannot bring it on with activity. She's been somewhat limited in her activities by back pain and she had recent cervical surgery. She denies any shortness of breath, PND or orthopnea. She has no palpitations, presyncope or syncope.  Allergies  Allergen Reactions  . Ceftin (Cefuroxime Axetil)   . Cefuroxime Axetil   . Cephalexin   . Codeine   . Diltiazem   . Ibuprofen   . Maxalt (Rizatriptan)   . Nsaids   . Omeprazole   . Penicillins   . Phenazopyridine Hcl   . Protonix (Pantoprazole Sodium)     Current Outpatient Prescriptions  Medication Sig Dispense Refill  . amLODipine (NORVASC) 5 MG tablet Take 5 mg by mouth daily.      Marland Kitchen aspirin 81 MG chewable tablet Chew 81 mg by mouth daily.      . isosorbide mononitrate (IMDUR) 30 MG 24 hr tablet Take 30 mg by mouth daily. Take 1/2 tablet every morning.      . nitroGLYCERIN (NITROSTAT) 0.4 MG SL tablet Place 0.4 mg under the tongue every 5 (five) minutes as needed.      Marland Kitchen estradiol (ESTRACE) 0.1 MG/GM vaginal cream Place 2 g vaginally daily.      Marland Kitchen glycopyrrolate (ROBINUL) 2 MG tablet Take 2 mg by mouth 2 (two) times daily.      Marland Kitchen lidocaine-hydrocortisone (ANAMANTLE HC) 3-0.5 % CREA Place 1 Applicatorful rectally 2 (two) times daily.       . Meth-Hyo-M Bl-Na Phos-Ph Sal (UTIRA-C) 81.6 MG TABS Take by mouth daily.        Past Medical History  Diagnosis Date  . Abnormal involuntary movements   . Allergic rhinitis, cause unspecified   . Anxiety state, unspecified   . Cervical spondylosis without myelopathy   . Disturbance of skin sensation   . Irritable bowel syndrome   . Lumbago   . Other and unspecified angina pectoris   . Other malaise and fatigue   . Disturbance of skin sensation   . Somatization disorder   . Spasm of muscle   . Systemic sclerosis   . Unspecified hereditary and idiopathic peripheral neuropathy   . Myalgia and myositis, unspecified     Past Surgical History  Procedure Date  . Back surgery     Lumbar x 2  . Abdominal hysterectomy   . Cholecystectomy   . Cervical spine surgery   . Oophorectomy   . Bladder surgery   . Pituitary excision     Family History  Problem Relation Age of Onset  . Hypertension Father   . Hyperlipidemia Father     History   Social History  . Marital Status: Married    Spouse Name: N/A    Number of Children: 2  . Years of Education: N/A  Occupational History  .     Social History Main Topics  . Smoking status: Never Smoker   . Smokeless tobacco: Not on file  . Alcohol Use: No  . Drug Use: No  . Sexually Active:    Other Topics Concern  . Not on file   Social History Narrative   Lives at home with children.    ROS:  Positive for nausea, diarrhea, leg cramping, leg and hand swelling, back pain. Otherwise as stated in the HPI and negative for all other systems.   PHYSICAL EXAM BP 105/73  Pulse 77  Resp 18  Ht 5\' 1"  (1.549 m)  Wt 132 lb 12.8 oz (60.238 kg)  BMI 25.09 kg/m2 GENERAL:  Well appearing HEENT:  Pupils equal round and reactive, fundi not visualized, oral mucosa unremarkable NECK:  No jugular venous distention, waveform within normal limits, carotid upstroke brisk and symmetric, no bruits, no thyromegaly LYMPHATICS:  No  cervical, inguinal adenopathy LUNGS:  Clear to auscultation bilaterally BACK:  No CVA tenderness CHEST:  Unremarkable HEART:  PMI not displaced or sustained,S1 and S2 within normal limits, no S3, no S4, no clicks, no rubs, no murmurs ABD:  Flat, positive bowel sounds normal in frequency in pitch, no bruits, no rebound, no guarding, no midline pulsatile mass, no hepatomegaly, no splenomegaly EXT:  2 plus pulses throughout, no edema, no cyanosis no clubbing SKIN:  No rashes no nodules NEURO:  Cranial nerves II through XII grossly intact, motor grossly intact throughout PSYCH:  Cognitively intact, oriented to person place and time  EKG:  Sinus rhythm, rate 62, axis within normal limits, intervals within normal limits, no acute ST-T wave changes.  03/25/2012   ASSESSMENT AND PLAN

## 2012-03-25 NOTE — Patient Instructions (Signed)
The current medical regimen is effective;  continue present plan and medications.  

## 2012-04-04 ENCOUNTER — Institutional Professional Consult (permissible substitution): Payer: Self-pay | Admitting: Cardiology

## 2012-04-15 ENCOUNTER — Telehealth: Payer: Self-pay | Admitting: Cardiology

## 2012-04-15 NOTE — Telephone Encounter (Signed)
We need to try again to get her [facility] records.

## 2012-04-15 NOTE — Telephone Encounter (Signed)
Patient called stating that she started having pain in her vaginal area last week and went to see her GYN and since vaginal exam her spasms in her chest have been worse.  Per patient her GYN MD felt that the problem was related to her cardiac spasms and she needed to follow up with her cardiologist.  She is also having discomfort in her abdominal area, left arm pain, and chest tightness. She continues to use an ASA and NTG.  She mentioned also having the feeling that something is crawling up her left leg.  Patient did see Dr Antoine Poche as a second opinion and is willing to go back to her cardiologist in [facility] if that's what Dr Antoine Poche would like for her to do.  Also she was unsure if Dr Antoine Poche had received records from [facility].  Will forward to Orlie Dakin RN with Dr Antoine Poche since they are both in the office

## 2012-04-15 NOTE — Telephone Encounter (Signed)
New Problem:    Patient called in because her muscle spasms have gotten worse, has pain in her upper right rib cage, left arm and abdomin.  Her left leg is really bothering her, has the sensation that something is crawling up her leg followed by a sharp pain.  Patient had to take a lot of nitros last Thursday to get the pain to subside.  Please call back.  Patient is not experiencing chest pain at the moment, just a little tightness.

## 2012-04-16 ENCOUNTER — Telehealth: Payer: Self-pay | Admitting: Cardiology

## 2012-04-16 ENCOUNTER — Encounter: Payer: Self-pay | Admitting: Cardiology

## 2012-04-16 NOTE — Telephone Encounter (Signed)
Spoke With Dora/MR @ [facility] she Will Refax Records to [place] to the  Scheduling Dept Fax #  04/16/12/KM

## 2012-04-16 NOTE — Telephone Encounter (Signed)
Records are available and will be given to Dr Antoine Poche on his return

## 2012-04-16 NOTE — Telephone Encounter (Signed)
Records Rec From [facility], Gave to [facility] 04/16/12/KM

## 2012-04-16 NOTE — Telephone Encounter (Signed)
Release re-faxed to [facility] to Obtain Records 04/16/12/KM

## 2012-04-16 NOTE — Telephone Encounter (Signed)
According to MR - the records were received on 5/23.  Will look for them here in the office - they have not been scanned into her chart.  Pt is aware to follow up with her Cardiologist in [facility] for now.

## 2012-05-07 ENCOUNTER — Telehealth: Payer: Self-pay | Admitting: Cardiology

## 2012-05-07 NOTE — Telephone Encounter (Signed)
At the time the pt was evaluated it was to have been a 2nd opinion.  She was instructed to a this point no further workup was deemed necessary by Dr Antoine Poche because pts s/s were stable.  She is now complaining of recurrent cp.  Instructed pt to follow up with [facility] or [facility] as needed.

## 2012-05-07 NOTE — Telephone Encounter (Signed)
Please return call to patient at [phone] regarding advice on which doctor she should f/u with [facility]  or [facility]

## 2012-06-19 ENCOUNTER — Other Ambulatory Visit: Payer: Self-pay | Admitting: Internal Medicine

## 2012-07-28 ENCOUNTER — Other Ambulatory Visit (HOSPITAL_COMMUNITY): Payer: Self-pay | Admitting: Neurosurgery

## 2012-07-28 DIAGNOSIS — M5412 Radiculopathy, cervical region: Secondary | ICD-10-CM

## 2012-07-31 ENCOUNTER — Ambulatory Visit (HOSPITAL_COMMUNITY)
Admission: RE | Admit: 2012-07-31 | Discharge: 2012-07-31 | Disposition: A | Discharge status: Home / Self Care | Payer: BC Managed Care – PPO | Source: Ambulatory Visit | Attending: Neurosurgery | Admitting: Neurosurgery

## 2012-07-31 DIAGNOSIS — R51 Headache: Secondary | ICD-10-CM | POA: Insufficient documentation

## 2012-07-31 DIAGNOSIS — M5090 Cervical disc disorder, unspecified, unspecified cervical region: Secondary | ICD-10-CM | POA: Insufficient documentation

## 2012-07-31 DIAGNOSIS — R209 Unspecified disturbances of skin sensation: Secondary | ICD-10-CM | POA: Insufficient documentation

## 2012-07-31 DIAGNOSIS — M5412 Radiculopathy, cervical region: Secondary | ICD-10-CM

## 2012-08-05 LAB — HM MAMMOGRAPHY

## 2012-08-21 ENCOUNTER — Other Ambulatory Visit: Payer: Self-pay | Admitting: Neurosurgery

## 2012-08-21 DIAGNOSIS — M48061 Spinal stenosis, lumbar region without neurogenic claudication: Secondary | ICD-10-CM

## 2012-08-25 ENCOUNTER — Inpatient Hospital Stay
Admission: RE | Admit: 2012-08-25 | Discharge: 2012-08-25 | Payer: BC Managed Care – PPO | Source: Ambulatory Visit | Attending: Neurosurgery | Admitting: Neurosurgery

## 2012-08-25 ENCOUNTER — Inpatient Hospital Stay: Admission: RE | Admit: 2012-08-25 | Payer: BC Managed Care – PPO | Source: Ambulatory Visit

## 2012-09-17 ENCOUNTER — Other Ambulatory Visit (INDEPENDENT_AMBULATORY_CARE_PROVIDER_SITE_OTHER): Payer: BC Managed Care – PPO

## 2012-09-17 ENCOUNTER — Encounter: Payer: Self-pay | Admitting: Internal Medicine

## 2012-09-17 ENCOUNTER — Ambulatory Visit (INDEPENDENT_AMBULATORY_CARE_PROVIDER_SITE_OTHER): Payer: BC Managed Care – PPO | Admitting: Internal Medicine

## 2012-09-17 VITALS — BP 126/62 | HR 77 | Temp 98.8°F | Ht 61.0 in | Wt 132.0 lb

## 2012-09-17 DIAGNOSIS — R300 Dysuria: Secondary | ICD-10-CM

## 2012-09-17 DIAGNOSIS — Z1329 Encounter for screening for other suspected endocrine disorder: Secondary | ICD-10-CM

## 2012-09-17 DIAGNOSIS — Z130 Encounter for screening for diseases of the blood and blood-forming organs and certain disorders involving the immune mechanism: Secondary | ICD-10-CM

## 2012-09-17 DIAGNOSIS — Z13220 Encounter for screening for lipoid disorders: Secondary | ICD-10-CM

## 2012-09-17 DIAGNOSIS — K760 Fatty (change of) liver, not elsewhere classified: Secondary | ICD-10-CM

## 2012-09-17 DIAGNOSIS — N390 Urinary tract infection, site not specified: Secondary | ICD-10-CM

## 2012-09-17 DIAGNOSIS — Z0000 Encounter for general adult medical examination without abnormal findings: Secondary | ICD-10-CM

## 2012-09-17 DIAGNOSIS — I201 Angina pectoris with documented spasm: Secondary | ICD-10-CM

## 2012-09-17 DIAGNOSIS — Z131 Encounter for screening for diabetes mellitus: Secondary | ICD-10-CM

## 2012-09-17 DIAGNOSIS — Z1321 Encounter for screening for nutritional disorder: Secondary | ICD-10-CM

## 2012-09-17 DIAGNOSIS — K7689 Other specified diseases of liver: Secondary | ICD-10-CM

## 2012-09-17 LAB — BASIC METABOLIC PANEL
BUN: 17 mg/dL (ref 6–23)
CO2: 30 mEq/L (ref 19–32)
Chloride: 100 mEq/L (ref 96–112)
Creatinine, Ser: 1 mg/dL (ref 0.4–1.2)

## 2012-09-17 LAB — POCT URINALYSIS DIPSTICK
Bilirubin, UA: NEGATIVE
Nitrite, UA: NEGATIVE
Spec Grav, UA: 1.02
pH, UA: 7

## 2012-09-17 LAB — LIPID PANEL
LDL Cholesterol: 94 mg/dL (ref 0–99)
Total CHOL/HDL Ratio: 3
Triglycerides: 114 mg/dL (ref 0.0–149.0)

## 2012-09-17 LAB — CBC
Hemoglobin: 14.3 g/dL (ref 12.0–15.0)
Platelets: 351 10*3/uL (ref 150.0–400.0)
RBC: 4.77 Mil/uL (ref 3.87–5.11)
RDW: 13.1 % (ref 11.5–14.6)
WBC: 6.9 10*3/uL (ref 4.5–10.5)

## 2012-09-17 LAB — HEPATIC FUNCTION PANEL
ALT: 19 U/L (ref 0–35)
Total Bilirubin: 0.7 mg/dL (ref 0.3–1.2)

## 2012-09-17 MED ORDER — NITROFURANTOIN MONOHYD MACRO 100 MG PO CAPS: 100.0000 mg | ORAL_CAPSULE | Freq: Two times a day (BID) | ORAL | Status: DC

## 2012-09-17 NOTE — Patient Instructions (Signed)
Health Maintenance, Females A healthy lifestyle and preventative care can promote health and wellness.  Maintain regular health, dental, and eye exams.  Eat a healthy diet. Foods like vegetables, fruits, whole grains, low-fat dairy products, and lean protein foods contain the nutrients you need without too many calories. Decrease your intake of foods high in solid fats, added sugars, and salt. Get information about a proper diet from your caregiver, if necessary.  Regular physical exercise is one of the most important things you can do for your health. Most adults should get at least 150 minutes of moderate-intensity exercise (any activity that increases your heart rate and causes you to sweat) each week. In addition, most adults need muscle-strengthening exercises on 2 or more days a week.   Maintain a healthy weight. The body mass index (BMI) is a screening tool to identify possible weight problems. It provides an estimate of body fat based on height and weight. Your caregiver can help determine your BMI, and can help you achieve or maintain a healthy weight. For adults 20 years and older:  A BMI below 18.5 is considered underweight.  A BMI of 18.5 to 24.9 is normal.  A BMI of 25 to 29.9 is considered overweight.  A BMI of 30 and above is considered obese.  Maintain normal blood lipids and cholesterol by exercising and minimizing your intake of saturated fat. Eat a balanced diet with plenty of fruits and vegetables. Blood tests for lipids and cholesterol should begin at age 20 and be repeated every 5 years. If your lipid or cholesterol levels are high, you are over 50, or you are a high risk for heart disease, you may need your cholesterol levels checked more frequently.Ongoing high lipid and cholesterol levels should be treated with medicines if diet and exercise are not effective.  If you smoke, find out from your caregiver how to quit. If you do not use tobacco, do not start.  If you  are pregnant, do not drink alcohol. If you are breastfeeding, be very cautious about drinking alcohol. If you are not pregnant and choose to drink alcohol, do not exceed 1 drink per day. One drink is considered to be 12 ounces (355 mL) of beer, 5 ounces (148 mL) of wine, or 1.5 ounces (44 mL) of liquor.  Avoid use of street drugs. Do not share needles with anyone. Ask for help if you need support or instructions about stopping the use of drugs.  High blood pressure causes heart disease and increases the risk of stroke. Blood pressure should be checked at least every 1 to 2 years. Ongoing high blood pressure should be treated with medicines, if weight loss and exercise are not effective.  If you are 55 to 45 years old, ask your caregiver if you should take aspirin to prevent strokes.  Diabetes screening involves taking a blood sample to check your fasting blood sugar level. This should be done once every 3 years, after age 45, if you are within normal weight and without risk factors for diabetes. Testing should be considered at a younger age or be carried out more frequently if you are overweight and have at least 1 risk factor for diabetes.  Breast cancer screening is essential preventative care for women. You should practice "breast self-awareness." This means understanding the normal appearance and feel of your breasts and may include breast self-examination. Any changes detected, no matter how small, should be reported to a caregiver. Women in their 20s and 30s should have   a clinical breast exam (CBE) by a caregiver as part of a regular health exam every 1 to 3 years. After age 40, women should have a CBE every year. Starting at age 40, women should consider having a mammogram (breast X-ray) every year. Women who have a family history of breast cancer should talk to their caregiver about genetic screening. Women at a high risk of breast cancer should talk to their caregiver about having an MRI and a  mammogram every year.  The Pap test is a screening test for cervical cancer. Women should have a Pap test starting at age 21. Between ages 21 and 29, Pap tests should be repeated every 2 years. Beginning at age 30, you should have a Pap test every 3 years as long as the past 3 Pap tests have been normal. If you had a hysterectomy for a problem that was not cancer or a condition that could lead to cancer, then you no longer need Pap tests. If you are between ages 65 and 70, and you have had normal Pap tests going back 10 years, you no longer need Pap tests. If you have had past treatment for cervical cancer or a condition that could lead to cancer, you need Pap tests and screening for cancer for at least 20 years after your treatment. If Pap tests have been discontinued, risk factors (such as a new sexual partner) need to be reassessed to determine if screening should be resumed. Some women have medical problems that increase the chance of getting cervical cancer. In these cases, your caregiver may recommend more frequent screening and Pap tests.  The human papillomavirus (HPV) test is an additional test that may be used for cervical cancer screening. The HPV test looks for the virus that can cause the cell changes on the cervix. The cells collected during the Pap test can be tested for HPV. The HPV test could be used to screen women aged 30 years and older, and should be used in women of any age who have unclear Pap test results. After the age of 30, women should have HPV testing at the same frequency as a Pap test.  Colorectal cancer can be detected and often prevented. Most routine colorectal cancer screening begins at the age of 50 and continues through age 75. However, your caregiver may recommend screening at an earlier age if you have risk factors for colon cancer. On a yearly basis, your caregiver may provide home test kits to check for hidden blood in the stool. Use of a small camera at the end of a  tube, to directly examine the colon (sigmoidoscopy or colonoscopy), can detect the earliest forms of colorectal cancer. Talk to your caregiver about this at age 50, when routine screening begins. Direct examination of the colon should be repeated every 5 to 10 years through age 75, unless early forms of pre-cancerous polyps or small growths are found.  Hepatitis C blood testing is recommended for all people born from 1945 through 1965 and any individual with known risks for hepatitis C.  Practice safe sex. Use condoms and avoid high-risk sexual practices to reduce the spread of sexually transmitted infections (STIs). Sexually active women aged 25 and younger should be checked for Chlamydia, which is a common sexually transmitted infection. Older women with new or multiple partners should also be tested for Chlamydia. Testing for other STIs is recommended if you are sexually active and at increased risk.  Osteoporosis is a disease in which the   bones lose minerals and strength with aging. This can result in serious bone fractures. The risk of osteoporosis can be identified using a bone density scan. Women ages 65 and over and women at risk for fractures or osteoporosis should discuss screening with their caregivers. Ask your caregiver whether you should be taking a calcium supplement or vitamin D to reduce the rate of osteoporosis.  Menopause can be associated with physical symptoms and risks. Hormone replacement therapy is available to decrease symptoms and risks. You should talk to your caregiver about whether hormone replacement therapy is right for you.  Use sunscreen with a sun protection factor (SPF) of 30 or greater. Apply sunscreen liberally and repeatedly throughout the day. You should seek shade when your shadow is shorter than you. Protect yourself by wearing long sleeves, pants, a wide-brimmed hat, and sunglasses year round, whenever you are outdoors.  Notify your caregiver of new moles or  changes in moles, especially if there is a change in shape or color. Also notify your caregiver if a mole is larger than the size of a pencil eraser.  Stay current with your immunizations. Document Released: 05/07/2011 Document Revised: 01/14/2012 Document Reviewed: 05/07/2011 [employer] Patient Information 2013 [employer]. Breast Self-Exam A self breast exam may help you find changes or problems while they are still small. Do a breast self-exam:  Every month.  One week after your period (menstrual period).  On the first day of each month if you do not have periods anymore. Look for any:  Change in breast color, size, or shape.  Dimples in your breast.  Changes in your nipples or skin.  Dry skin on your breasts or nipples.  Watery or bloody discharge from your nipples.  Feel for:  Lumps.  Thick, hard places.  Any other changes. HOME CARE There are 3 ways to do the breast self-exam: In front of a mirror.  Lift your arms over your head and turn side to side.  Put your hands on your hips and lean down, then turn from side to side.  Bend forward and turn from side to side. In the shower.  With soapy hands, check both breasts. Then check above and below your collarbone and your armpits.  Feel above and below your collarbone down to under your breast, and from the center of your chest to the outer edge of the armpit. Check for any lumps or hard spots.  Using the tips of your middle three fingers check your whole breast by pressing your hand over your breast in a circle or in an up and down motion. Lying down.  Lie flat on your bed.  Put a small pillow under the breast you are going to check. On that same side, put your hand behind your head.  With your other hand, use the 3 middle fingers to feel the breast.  Move your fingers in a circle around the breast. Press firmly over all parts of the breast to feel for any lumps. GET HELP RIGHT AWAY IF: You find any  changes in your breasts so they can be checked. Document Released: 04/09/2008 Document Revised: 01/14/2012 Document Reviewed: 02/09/2009 [employer] Patient Information 2013 [employer].  

## 2012-09-17 NOTE — Progress Notes (Signed)
Subjective:    Patient ID: Virginia Shaw, female    DOB: 1966-04-21, 46 y.o.   MRN: [id number]  HPI  Pt presents to the clinic to establish care. She has c/o of abd pain that has been going on for more than 2 years. She has had multiple abdominal surgeries, fatty liver as well as renal cysts evident on ct scan She has been evaluated by GI who provided her with an extensive workup including a colonoscopy, all of which were normal. Her pain is a constant 5/10. She is unable to take pain medicine due to the side effects. Certain positions make it worse, but lying on her side makes the pain better. She also complains of "spasms in her chest". 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 She feels like they are doing nothing about it. She has these spasms every day requiring the use of aspirin and nirtoglycerin SL. She states that "cold air and BM'S" percipitate these spasms. Nothing really makes them better, they just eventually go away with time. She would like referral for a second opinion. Additionally patient reports burning with urination that started 3 days ago. She does have a history of recurrent UTI's. She has not taken anything to relieve the pain, she is just trying to increase her fluid intake. The pain is intermittent and only when she uses the restroom. She denies nausea, vomiting, fever, chills or back pain.  Flu shot: declines Tdap: 2007 Pap Smear: hysterectomy Mammogram: 08/2012 LMP: hysterectomy  Review of Systems     Past Medical History  Diagnosis Date  . Abnormal involuntary movements   . Allergic rhinitis, cause unspecified   . Anxiety state, unspecified   . Cervical spondylosis without myelopathy   . Disturbance of skin sensation   . Irritable bowel syndrome   . Lumbago   . Other and unspecified angina pectoris   . Other malaise and fatigue   . Disturbance of skin sensation   . Somatization disorder   . Spasm of muscle   . Systemic sclerosis   .  Unspecified hereditary and idiopathic peripheral neuropathy   . Myalgia and myositis, unspecified   . History of chicken pox   . GERD (gastroesophageal reflux disease)     Current Outpatient Prescriptions  Medication Sig Dispense Refill  . amLODipine (NORVASC) 5 MG tablet Take 5 mg by mouth daily.      Marland Kitchen aspirin 81 MG chewable tablet Chew 81 mg by mouth daily.      . isosorbide mononitrate (IMDUR) 30 MG 24 hr tablet Take 1/2 tablet every morning.      . nitroGLYCERIN (NITROSTAT) 0.4 MG SL tablet Place 0.4 mg under the tongue every 5 (five) minutes as needed.        Allergies  Allergen Reactions  . Ceftin (Cefuroxime Axetil)   . Cefuroxime Axetil   . Cephalexin   . Codeine   . Diltiazem   . Ibuprofen   . Maxalt (Rizatriptan)   . Nsaids   . Omeprazole   . Penicillins   . Phenazopyridine Hcl   . Protonix (Pantoprazole Sodium)     Family History  Problem Relation Age of Onset  . Hypertension Father   . Hyperlipidemia Father   . Diabetes Father   . Stroke Father   . Heart attack Paternal Grandmother   . Heart attack Paternal Grandfather   . Hypertension Paternal Grandfather     History   Social History  . Marital Status: Married  Spouse Name: N/A    Number of Children: 2  . Years of Education: GED   Occupational History  .     Social History Main Topics  . Smoking status: Never Smoker   . Smokeless tobacco: Never Used  . Alcohol Use: No  . Drug Use: No  . Sexually Active: No   Other Topics Concern  . Not on file   Social History Narrative   Lives at home with children.Regular exercise-yesCaffeine Use-no     Constitutional: Denies fever, malaise, fatigue, headache or abrupt weight changes.  HEENT: Denies eye pain, eye redness, ear pain, ringing in the ears, wax buildup, runny nose, nasal congestion, bloody nose, or sore throat. Respiratory: Denies difficulty breathing, shortness of breath, cough or sputum production.   Cardiovascular: Pt reports spasm  in her left arm that go into her chest. Denies chest pain, chest tightness, palpitations or swelling in the hands or feet.  Gastrointestinal: Pt reports abdominal pain. Denies bloating, constipation, diarrhea or blood in the stool.  GU: Pt reports a burning sensation during urination. Denies urgency, frequency, pain with urination, blood in urine, odor or discharge. Musculoskeletal: Denies decrease in range of motion, difficulty with gait, muscle pain or joint pain and swelling.  Skin: Denies redness, rashes, lesions or ulcercations.  Neurological: Denies dizziness, difficulty with memory, difficulty with speech or problems with balance and coordination.   No other specific complaints in a complete review of systems (except as listed in HPI above).  Objective:   Physical Exam  BP 126/62  Pulse 77  Temp 98.8 F (37.1 C) (Oral)  Ht 5\' 1"  (1.549 m)  Wt 132 lb (59.875 kg)  BMI 24.94 kg/m2  SpO2 96% Wt Readings from Last 3 Encounters:  09/17/12 132 lb (59.875 kg)  03/25/12 132 lb 12.8 oz (60.238 kg)  08/26/08 128 lb 8 oz (58.287 kg)    General: Appears her stated age, well developed, well nourished in NAD. Skin: Warm, dry and intact. No rashes, lesions or ulcerations noted. HEENT: Head: normal shape and size; Eyes: sclera white, no icterus, conjunctiva pink, PERRLA and EOMs intact; Ears: Tm's gray and intact, normal light reflex; Nose: mucosa pink and moist, septum midline; Throat/Mouth: Teeth present, mucosa pink and moist, no exudate, lesions or ulcerations noted.  Neck: Normal range of motion. Neck supple, trachea midline. No massses, lumps or thyromegaly present.  Cardiovascular: Normal rate and rhythm. S1,S2 noted.  No murmur, rubs or gallops noted. No JVD or BLE edema. No carotid bruits noted. Pulmonary/Chest: Normal effort and positive vesicular breath sounds. No respiratory distress. No wheezes, rales or ronchi noted.  Abdomen: Soft and tender bilaterally. Normal bowel sounds, no  bruits noted. No distention or masses noted. Liver, spleen and kidneys non palpable. Musculoskeletal: Normal range of motion. No signs of joint swelling. No difficulty with gait.  Neurological: Alert and oriented. Cranial nerves II-XII intact. Coordination normal. +DTRs bilaterally. Psychiatric: Mood and affect normal. Behavior is normal. Judgment and thought content normal.     BMET    Component Value Date/Time   NA 141 11/23/2009 0836   K 4.6 11/23/2009 0836   CL 106 11/23/2009 0715   CO2 30 11/23/2009 0715   GLUCOSE 95 11/23/2009 0836   BUN 12 11/23/2009 0715   CREATININE 0.82 11/23/2009 0715   CALCIUM 9.8 11/23/2009 0715   GFRNONAA >60 11/23/2009 0715   GFRAA  Value: >60        The eGFR has been calculated using the MDRD equation. This  calculation has not been validated in all clinical situations. eGFR's persistently <60 mL/min signify possible Chronic Kidney Disease. 11/23/2009 0715    Lipid Panel  No results found for this basename: chol, trig, hdl, cholhdl, vldl, ldlcalc    CBC    Component Value Date/Time   WBC 5.4 11/10/2008 1142   RBC 4.99 11/10/2008 1142   HGB 13.6 11/23/2009 0836   HCT 40.0 11/23/2009 0836   PLT 321 11/10/2008 1142   MCV 88.9 11/10/2008 1142   MCHC 33.8 11/10/2008 1142   RDW 12.6 11/10/2008 1142   LYMPHSABS 1.8 11/10/2008 1142   MONOABS 0.4 11/10/2008 1142   EOSABS 0.2 11/10/2008 1142   BASOSABS 0.1 11/10/2008 1142    Hgb A1C No results found for this basename: HGBA1C         Assessment & Plan:  Establish Care Preventative Health Maintenance visit  Will obtain basi labs: CBC, BMET, TSH, lipid profile, vit D  Prinzmetal's Angina  Referral to cardiology for second opinion Continue taking ASA and Nitro SL as needed until further evaluation  Fatty Liver  Will check LFT's today  Dysuria UTI, new problem with additional workup required:  Urinalysis Macrobid 100 mg BID x 5 days  RTC in 1 year for annual wellness visit

## 2012-09-18 ENCOUNTER — Encounter: Payer: Self-pay | Admitting: Internal Medicine

## 2012-09-22 ENCOUNTER — Other Ambulatory Visit: Payer: Self-pay | Admitting: Internal Medicine

## 2012-09-22 ENCOUNTER — Telehealth: Payer: Self-pay | Admitting: *Deleted

## 2012-09-22 DIAGNOSIS — N390 Urinary tract infection, site not specified: Secondary | ICD-10-CM

## 2012-09-22 MED ORDER — SULFAMETHOXAZOLE-TMP DS 800-160 MG PO TABS: 1.0000 | ORAL_TABLET | Freq: Two times a day (BID) | ORAL | Status: DC

## 2012-09-22 NOTE — Telephone Encounter (Signed)
Ash, I switched her abx to septra. I have already placed the order. Please call her and tell her to stop taking the macrobid and start taking septra 1 tab BID x 3 days. Thx! Rene Kocher

## 2012-09-22 NOTE — Telephone Encounter (Signed)
Pt states that ABX given last week (Macrobid) is causing her mouth to "go numb" and that she cannot take it.

## 2012-09-22 NOTE — Progress Notes (Signed)
Pt with UTI, unable to take Macrobid because it is causing her mouth to go numb. ABX switched to Septra.

## 2012-09-22 NOTE — Telephone Encounter (Signed)
Pt informed of new ABX and of RB's advisement.

## 2012-09-24 ENCOUNTER — Encounter: Payer: Self-pay | Admitting: Internal Medicine

## 2012-09-24 ENCOUNTER — Ambulatory Visit (INDEPENDENT_AMBULATORY_CARE_PROVIDER_SITE_OTHER)
Admission: RE | Admit: 2012-09-24 | Discharge: 2012-09-24 | Disposition: A | Discharge status: Home / Self Care | Payer: BC Managed Care – PPO | Source: Ambulatory Visit | Attending: Internal Medicine | Admitting: Internal Medicine

## 2012-09-24 ENCOUNTER — Telehealth: Payer: Self-pay | Admitting: Internal Medicine

## 2012-09-24 ENCOUNTER — Ambulatory Visit (INDEPENDENT_AMBULATORY_CARE_PROVIDER_SITE_OTHER): Payer: BC Managed Care – PPO | Admitting: Internal Medicine

## 2012-09-24 VITALS — BP 126/82 | HR 82 | Temp 97.8°F | Ht 61.0 in | Wt 131.0 lb

## 2012-09-24 DIAGNOSIS — R079 Chest pain, unspecified: Secondary | ICD-10-CM

## 2012-09-24 DIAGNOSIS — R0781 Pleurodynia: Secondary | ICD-10-CM

## 2012-09-24 DIAGNOSIS — M5430 Sciatica, unspecified side: Secondary | ICD-10-CM

## 2012-09-24 MED ORDER — PREDNISONE 10 MG PO TABS: 10.0000 mg | ORAL_TABLET | Freq: Every day | ORAL | Status: DC

## 2012-09-24 NOTE — Progress Notes (Signed)
Subjective:    Patient ID: Virginia Shaw, female    DOB: January 07, 1966, 46 y.o.   MRN: [id number]  HPI  Pt presents to the clinic today with c/o ongoing right sided chest pain. This pain has been persistent for more than 2 years. She was recently seen and evaluated for a UTI. She was placed on Macrobid. After a few days of taking it, she called back to the office saying she felt like her mouth was going numb. I discontinued the Macrobid and started her on Septra. She now feels like her right sided chest pain is worse and she is attributing this to the antibiotic. The pain is located on her right rib, also along both sides of her breastbone. The pain is 6/10. Due to her allergies, she is not able to take anything for the pain. She also c/o intermittent abdominal spasms. She is unsure what this is related to. The pain is really sharp, last for just a few seconds, then eases up. She has had some nausea with these abdominal spasms but no vomiting. Additionally, the patient c/o of lefy lower back pain. This is also a problem for her but she is now experiencing sharp, shooting pains into her left buttocks with numbness and tingling down into her left thigh. This started 3 days ago, but she feels like it is worse today. The sharp shooting pains are worse when she bends over or tries to sit down. Standing up relieves the pain. She has not taking any medication to help relieve the pain.  Review of Systems  Past Medical History  Diagnosis Date  . Abnormal involuntary movements   . Allergic rhinitis, cause unspecified   . Anxiety state, unspecified   . Cervical spondylosis without myelopathy   . Disturbance of skin sensation   . Irritable bowel syndrome   . Lumbago   . Other and unspecified angina pectoris   . Other malaise and fatigue   . Disturbance of skin sensation   . Somatization disorder   . Spasm of muscle   . Systemic sclerosis   . Unspecified hereditary and idiopathic peripheral neuropathy     . Myalgia and myositis, unspecified   . History of chicken pox   . GERD (gastroesophageal reflux disease)     Current Outpatient Prescriptions  Medication Sig Dispense Refill  . amLODipine (NORVASC) 5 MG tablet Take 5 mg by mouth daily.      Marland Kitchen aspirin 81 MG chewable tablet Chew 81 mg by mouth daily.      . isosorbide mononitrate (IMDUR) 30 MG 24 hr tablet Take 1/2 tablet every morning.      . nitroGLYCERIN (NITROSTAT) 0.4 MG SL tablet Place 0.4 mg under the tongue every 5 (five) minutes as needed.      . sulfamethoxazole-trimethoprim (BACTRIM DS) 800-160 MG per tablet Take 1 tablet by mouth 2 (two) times daily.  6 tablet  0    Allergies  Allergen Reactions  . Ceftin (Cefuroxime Axetil)   . Cefuroxime Axetil   . Cephalexin   . Codeine   . Diltiazem   . Ibuprofen   . Macrobid (Nitrofurantoin Monohyd Macro) Other (See Comments)    Numbness in mouth  . Maxalt (Rizatriptan)   . Nsaids   . Omeprazole   . Penicillins   . Phenazopyridine Hcl   . Protonix (Pantoprazole Sodium)     Family History  Problem Relation Age of Onset  . Hypertension Father   . Hyperlipidemia Father   .  Diabetes Father   . Stroke Father   . Heart attack Paternal Grandmother   . Heart attack Paternal Grandfather   . Hypertension Paternal Grandfather     History   Social History  . Marital Status: Married    Spouse Name: N/A    Number of Children: 2  . Years of Education: GED   Occupational History  .     Social History Main Topics  . Smoking status: Never Smoker   . Smokeless tobacco: Never Used  . Alcohol Use: No  . Drug Use: No  . Sexually Active: No   Other Topics Concern  . Not on file   Social History Narrative   Lives at home with children.Regular exercise-yesCaffeine Use-no     Constitutional: Denies fever, malaise, fatigue, headache or abrupt weight changes. Marland Kitchen Respiratory: Denies difficulty breathing, shortness of breath, cough or sputum production.   Cardiovascular:  Denies chest pain, chest tightness, palpitations or swelling in the hands or feet.  Gastrointestinal: Pt reports abdominal spasms and nausea. Denies abdominal pain, bloating, constipation, vomiting, diarrhea or blood in the stool.  Musculoskeletal: Pt reports right sided rib pain and left lower back pain with pain in the left buttocks and thigh.. Denies decrease in range of motion, difficulty with gait, muscle pain or joint pain and swelling.  Neurological: Denies dizziness, difficulty with memory, difficulty with speech or problems with balance and coordination.   No other specific complaints in a complete review of systems (except as listed in HPI above).     Objective:   Physical Exam  BP 126/82  Pulse 82  Temp 97.8 F (36.6 C) (Oral)  Ht 5\' 1"  (1.549 m)  Wt 131 lb (59.421 kg)  BMI 24.75 kg/m2  SpO2 96% Wt Readings from Last 3 Encounters:  09/24/12 131 lb (59.421 kg)  09/17/12 132 lb (59.875 kg)  03/25/12 132 lb 12.8 oz (60.238 kg)    General: Appears her stated age, well developed, well nourished in NAD. Cardiovascular: Normal rate and rhythm. S1,S2 noted.  No murmur, rubs or gallops noted. No JVD or BLE edema. No carotid bruits noted. Pulmonary/Chest: Pinpoint tenderness along the sternal border at the junction of the ribs bilaterally. Pinpoint tenderness along the bottom right rib. Normal effort and positive vesicular breath sounds. No respiratory distress. No wheezes, rales or ronchi noted.  Abdomen: Soft and nontender. Normal bowel sounds, no bruits noted. No distention or masses noted. Liver, spleen and kidneys non palpable. Musculoskeletal: Normal range of motion. No signs of joint swelling. No difficulty with gait.  Neurological: Alert and oriented. Cranial nerves II-XII intact. Coordination normal. +DTRs bilaterally. Psychiatric: Mood and affect normal. Behavior is normal. Judgment and thought content normal.   EKG: NSR      Assessment & Plan:   Rib pain, most  likely related to inflammation vs intercostal nerve irritation, new onset with additional workup required:  EKG to rule out cardiac etiology. CXRAY to rule out bony abnormality of the ribs. Can apply a heating pad to the affected area.  Sciatica, new onset with additional workup required:  Stretching exercises given in educational handout. Prednisone 10 mg x 7  Days.  Keep your appointment with Cardiology for the 1st week in December 2013.  If chest symptoms worse, get evaluated immediately wether in the clinic or ER

## 2012-09-24 NOTE — Patient Instructions (Addendum)
Chest Pain (Nonspecific)  It is often hard to give a specific diagnosis for the cause of chest pain. There is always a chance that your pain could be related to something serious, such as a heart attack or a blood clot in the lungs. You need to follow up with your caregiver for further evaluation.  CAUSES    Heartburn.   Pneumonia or bronchitis.   Anxiety or stress.   Inflammation around your heart (pericarditis) or lung (pleuritis or pleurisy).   A blood clot in the lung.   A collapsed lung (pneumothorax). It can develop suddenly on its own (spontaneous pneumothorax) or from injury (trauma) to the chest.   Shingles infection (herpes zoster virus).  The chest wall is composed of bones, muscles, and cartilage. Any of these can be the source of the pain.   The bones can be bruised by injury.   The muscles or cartilage can be strained by coughing or overwork.   The cartilage can be affected by inflammation and become sore (costochondritis).  DIAGNOSIS   Lab tests or other studies, such as X-rays, electrocardiography, stress testing, or cardiac imaging, may be needed to find the cause of your pain.   TREATMENT    Treatment depends on what may be causing your chest pain. Treatment may include:   Acid blockers for heartburn.   Anti-inflammatory medicine.   Pain medicine for inflammatory conditions.   Antibiotics if an infection is present.   You may be advised to change lifestyle habits. This includes stopping smoking and avoiding alcohol, caffeine, and chocolate.   You may be advised to keep your head raised (elevated) when sleeping. This reduces the chance of acid going backward from your stomach into your esophagus.   Most of the time, nonspecific chest pain will improve within 2 to 3 days with rest and mild pain medicine.  HOME CARE INSTRUCTIONS    If antibiotics were prescribed, take your antibiotics as directed. Finish them even if you start to feel better.   For the next few days, avoid physical  activities that bring on chest pain. Continue physical activities as directed.   Do not smoke.   Avoid drinking alcohol.   Only take over-the-counter or prescription medicine for pain, discomfort, or fever as directed by your caregiver.   Follow your caregiver's suggestions for further testing if your chest pain does not go away.   Keep any follow-up appointments you made. If you do not go to an appointment, you could develop lasting (chronic) problems with pain. If there is any problem keeping an appointment, you must call to reschedule.  SEEK MEDICAL CARE IF:    You think you are having problems from the medicine you are taking. Read your medicine instructions carefully.   Your chest pain does not go away, even after treatment.   You develop a rash with blisters on your chest.  SEEK IMMEDIATE MEDICAL CARE IF:    You have increased chest pain or pain that spreads to your arm, neck, jaw, back, or abdomen.   You develop shortness of breath, an increasing cough, or you are coughing up blood.   You have severe back or abdominal pain, feel nauseous, or vomit.   You develop severe weakness, fainting, or chills.   You have a fever.  THIS IS AN EMERGENCY. Do not wait to see if the pain will go away. Get medical help at once. Call your local emergency services (911 in [place] Do not drive yourself to the   YOU:    Understand these instructions.   Will watch your condition.   Will get help right away if you are not doing well or get worse.  Document Released: 08/01/2005 Document Revised: 01/14/2012 Document Reviewed: 05/27/2008 [facility] Patient Information 2013 [place], [place].   Sciatica with Rehab The sciatic nerve runs from the back down the leg and is responsible for sensation and control of the muscles in the back (posterior) side of the thigh, lower leg, and foot. Sciatica is a condition that is characterized by inflammation of this  nerve.   SYMPTOMS    Signs of nerve damage, including numbness and/or weakness along the posterior side of the lower extremity.   Pain in the back of the thigh that may also travel down the leg.   Pain that worsens when sitting for long periods of time.   Occasionally, pain in the back or buttock.  CAUSES   Inflammation of the sciatic nerve is the cause of sciatica. The inflammation is due to something irritating the nerve. Common sources of irritation include:  Sitting for long periods of time.   Direct trauma to the nerve.   Arthritis of the spine.   Herniated or ruptured disk.   Slipping of the vertebrae (spondylolithesis)   Pressure from soft tissues, such as muscles or ligament-like tissue (fascia).  RISK INCREASES WITH:  Sports that place pressure or stress on the spine (football or weightlifting).   Poor strength and flexibility.   Failure to warm-up properly before activity.   Family history of low back pain or disk disorders.   Previous back injury or surgery.   Poor body mechanics, especially when lifting, or poor posture.  PREVENTION    Warm up and stretch properly before activity.   Maintain physical fitness:   Strength, flexibility, and endurance.   Cardiovascular fitness.   Learn and use proper technique, especially with posture and lifting. When possible, have coach correct improper technique.   Avoid activities that place stress on the spine.  PROGNOSIS If treated properly, then sciatica usually resolves within 6 weeks. However, occasionally surgery is necessary.   RELATED COMPLICATIONS    Permanent nerve damage, including pain, numbness, tingle, or weakness.   Chronic back pain.   Risks of surgery: infection, bleeding, nerve damage, or damage to surrounding tissues.  TREATMENT Treatment initially involves resting from any activities that aggravate your symptoms. The use of ice and medication may help reduce pain and inflammation. The use of  strengthening and stretching exercises may help reduce pain with activity. These exercises may be performed at home or with referral to a therapist. A therapist may recommend further treatments, such as transcutaneous electronic nerve stimulation (TENS) or ultrasound. Your caregiver may recommend corticosteroid injections to help reduce inflammation of the sciatic nerve. If symptoms persist despite non-surgical (conservative) treatment, then surgery may be recommended. MEDICATION  If pain medication is necessary, then nonsteroidal anti-inflammatory medications, such as aspirin and ibuprofen, or other minor pain relievers, such as acetaminophen, are often recommended.   Do not take pain medication for 7 days before surgery.   Prescription pain relievers may be given if deemed necessary by your caregiver. Use only as directed and only as much as you need.   Ointments applied to the skin may be helpful.   Corticosteroid injections may be given by your caregiver. These injections should be reserved for the most serious cases, because they may only be given a certain number of times.  HEAT AND COLD  Cold  treatment (icing) relieves pain and reduces inflammation. Cold treatment should be applied for 10 to 15 minutes every 2 to 3 hours for inflammation and pain and immediately after any activity that aggravates your symptoms. Use ice packs or massage the area with a piece of ice (ice massage).   Heat treatment may be used prior to performing the stretching and strengthening activities prescribed by your caregiver, physical therapist, or athletic trainer. Use a heat pack or soak the injury in warm water.  SEEK MEDICAL CARE IF:  Treatment seems to offer no benefit, or the condition worsens.   Any medications produce adverse side effects.  EXERCISES   RANGE OF MOTION (ROM) AND STRETCHING EXERCISES - Sciatica Most people with sciatic will find that their symptoms worsen with either excessive bending  forward (flexion) or arching at the low back (extension). The exercises which will help resolve your symptoms will focus on the opposite motion. Your physician, physical therapist or athletic trainer will help you determine which exercises will be most helpful to resolve your low back pain. Do not complete any exercises without first consulting with your clinician. Discontinue any exercises which worsen your symptoms until you speak to your clinician. If you have pain, numbness or tingling which travels down into your buttocks, leg or foot, the goal of the therapy is for these symptoms to move closer to your back and eventually resolve. Occasionally, these leg symptoms will get better, but your low back pain may worsen; this is typically an indication of progress in your rehabilitation. Be certain to be very alert to any changes in your symptoms and the activities in which you participated in the 24 hours prior to the change. Sharing this information with your clinician will allow him/her to most efficiently treat your condition. These exercises may help you when beginning to rehabilitate your injury. Your symptoms may resolve with or without further involvement from your physician, physical therapist or athletic trainer. While completing these exercises, remember:    Restoring tissue flexibility helps normal motion to return to the joints. This allows healthier, less painful movement and activity.   An effective stretch should be held for at least 30 seconds.   A stretch should never be painful. You should only feel a gentle lengthening or release in the stretched tissue.  FLEXION RANGE OF MOTION AND STRETCHING EXERCISES: STRETCH  Flexion, Single Knee to Chest   Lie on a firm bed or floor with both legs extended in front of you.   Keeping one leg in contact with the floor, bring your opposite knee to your chest. Hold your leg in place by either grabbing behind your thigh or at your knee.   Pull until  you feel a gentle stretch in your low back. Hold __________ seconds.   Slowly release your grasp and repeat the exercise with the opposite side.  Repeat __________ times. Complete this exercise __________ times per day.   STRETCH  Flexion, Double Knee to Chest  Lie on a firm bed or floor with both legs extended in front of you.   Keeping one leg in contact with the floor, bring your opposite knee to your chest.   Tense your stomach muscles to support your back and then lift your other knee to your chest. Hold your legs in place by either grabbing behind your thighs or at your knees.   Pull both knees toward your chest until you feel a gentle stretch in your low back. Hold __________ seconds.  Tense your stomach muscles and slowly return one leg at a time to the floor.  Repeat __________ times. Complete this exercise __________ times per day.   STRETCH  Low Trunk Rotation   Lie on a firm bed or floor. Keeping your legs in front of you, bend your knees so they are both pointed toward the ceiling and your feet are flat on the floor.   Extend your arms out to the side. This will stabilize your upper body by keeping your shoulders in contact with the floor.   Gently and slowly drop both knees together to one side until you feel a gentle stretch in your low back. Hold for __________ seconds.   Tense your stomach muscles to support your low back as you bring your knees back to the starting position. Repeat the exercise to the other side.  Repeat __________ times. Complete this exercise __________ times per day   EXTENSION RANGE OF MOTION AND FLEXIBILITY EXERCISES: STRETCH  Extension, Prone on Elbows  Lie on your stomach on the floor, a bed will be too soft. Place your palms about shoulder width apart and at the height of your head.   Place your elbows under your shoulders. If this is too painful, stack pillows under your chest.   Allow your body to relax so that your hips drop lower and  make contact more completely with the floor.   Hold this position for __________ seconds.   Slowly return to lying flat on the floor.  Repeat __________ times. Complete this exercise __________ times per day.   RANGE OF MOTION  Extension, Prone Press Ups  Lie on your stomach on the floor, a bed will be too soft. Place your palms about shoulder width apart and at the height of your head.   Keeping your back as relaxed as possible, slowly straighten your elbows while keeping your hips on the floor. You may adjust the placement of your hands to maximize your comfort. As you gain motion, your hands will come more underneath your shoulders.   Hold this position __________ seconds.   Slowly return to lying flat on the floor.  Repeat __________ times. Complete this exercise __________ times per day.   STRENGTHENING EXERCISES - Sciatica  These exercises may help you when beginning to rehabilitate your injury. These exercises should be done near your "sweet spot." This is the neutral, low-back arch, somewhere between fully rounded and fully arched, that is your least painful position. When performed in this safe range of motion, these exercises can be used for people who have either a flexion or extension based injury. These exercises may resolve your symptoms with or without further involvement from your physician, physical therapist or athletic trainer. While completing these exercises, remember:    Muscles can gain both the endurance and the strength needed for everyday activities through controlled exercises.   Complete these exercises as instructed by your physician, physical therapist or athletic trainer. Progress with the resistance and repetition exercises only as your caregiver advises.   You may experience muscle soreness or fatigue, but the pain or discomfort you are trying to eliminate should never worsen during these exercises. If this pain does worsen, stop and make certain you are  following the directions exactly. If the pain is still present after adjustments, discontinue the exercise until you can discuss the trouble with your clinician.  STRENGTHENING Deep Abdominals, Pelvic Tilt   Lie on a firm bed or floor. Keeping your legs in front  of you, bend your knees so they are both pointed toward the ceiling and your feet are flat on the floor.   Tense your lower abdominal muscles to press your low back into the floor. This motion will rotate your pelvis so that your tail bone is scooping upwards rather than pointing at your feet or into the floor.   With a gentle tension and even breathing, hold this position for __________ seconds.  Repeat __________ times. Complete this exercise __________ times per day.   STRENGTHENING  Abdominals, Crunches   Lie on a firm bed or floor. Keeping your legs in front of you, bend your knees so they are both pointed toward the ceiling and your feet are flat on the floor. Cross your arms over your chest.   Slightly tip your chin down without bending your neck.   Tense your abdominals and slowly lift your trunk high enough to just clear your shoulder blades. Lifting higher can put excessive stress on the low back and does not further strengthen your abdominal muscles.   Control your return to the starting position.  Repeat __________ times. Complete this exercise __________ times per day.   STRENGTHENING  Quadruped, Opposite UE/LE Lift  Assume a hands and knees position on a firm surface. Keep your hands under your shoulders and your knees under your hips. You may place padding under your knees for comfort.   Find your neutral spine and gently tense your abdominal muscles so that you can maintain this position. Your shoulders and hips should form a rectangle that is parallel with the floor and is not twisted.   Keeping your trunk steady, lift your right hand no higher than your shoulder and then your left leg no higher than your hip. Make  sure you are not holding your breath. Hold this position __________ seconds.   Continuing to keep your abdominal muscles tense and your back steady, slowly return to your starting position. Repeat with the opposite arm and leg.  Repeat __________ times. Complete this exercise __________ times per day.   STRENGTHENING  Abdominals and Quadriceps, Straight Leg Raise   Lie on a firm bed or floor with both legs extended in front of you.   Keeping one leg in contact with the floor, bend the other knee so that your foot can rest flat on the floor.   Find your neutral spine, and tense your abdominal muscles to maintain your spinal position throughout the exercise.   Slowly lift your straight leg off the floor about 6 inches for a count of 15, making sure to not hold your breath.   Still keeping your neutral spine, slowly lower your leg all the way to the floor.  Repeat this exercise with each leg __________ times. Complete this exercise __________ times per day. POSTURE AND BODY MECHANICS CONSIDERATIONS - Sciatica Keeping correct posture when sitting, standing or completing your activities will reduce the stress put on different body tissues, allowing injured tissues a chance to heal and limiting painful experiences. The following are general guidelines for improved posture. Your physician or physical therapist will provide you with any instructions specific to your needs. While reading these guidelines, remember:  The exercises prescribed by your provider will help you have the flexibility and strength to maintain correct postures.   The correct posture provides the optimal environment for your joints to work. All of your joints have less wear and tear when properly supported by a spine with good posture. This means you  will experience a healthier, less painful body.   Correct posture must be practiced with all of your activities, especially prolonged sitting and standing. Correct posture is as  important when doing repetitive low-stress activities (typing) as it is when doing a single heavy-load activity (lifting).  RESTING POSITIONS Consider which positions are most painful for you when choosing a resting position. If you have pain with flexion-based activities (sitting, bending, stooping, squatting), choose a position that allows you to rest in a less flexed posture. You would want to avoid curling into a fetal position on your side. If your pain worsens with extension-based activities (prolonged standing, working overhead), avoid resting in an extended position such as sleeping on your stomach. Most people will find more comfort when they rest with their spine in a more neutral position, neither too rounded nor too arched. Lying on a non-sagging bed on your side with a pillow between your knees, or on your back with a pillow under your knees will often provide some relief. Keep in mind, being in any one position for a prolonged period of time, no matter how correct your posture, can still lead to stiffness. PROPER SITTING POSTURE In order to minimize stress and discomfort on your spine, you must sit with correct posture Sitting with good posture should be effortless for a healthy body. Returning to good posture is a gradual process. Many people can work toward this most comfortably by using various supports until they have the flexibility and strength to maintain this posture on their own. When sitting with proper posture, your ears will fall over your shoulders and your shoulders will fall over your hips. You should use the back of the chair to support your upper back. Your low back will be in a neutral position, just slightly arched. You may place a small pillow or folded towel at the base of your low back for support.   When working at a desk, create an environment that supports good, upright posture. Without extra support, muscles fatigue and lead to excessive strain on joints and other  tissues. Keep these recommendations in mind: CHAIR:   A chair should be able to slide under your desk when your back makes contact with the back of the chair. This allows you to work closely.   The chair's height should allow your eyes to be level with the upper part of your monitor and your hands to be slightly lower than your elbows.  BODY POSITION  Your feet should make contact with the floor. If this is not possible, use a foot rest.   Keep your ears over your shoulders. This will reduce stress on your neck and low back.  INCORRECT SITTING POSTURES   If you are feeling tired and unable to assume a healthy sitting posture, do not slouch or slump. This puts excessive strain on your back tissues, causing more damage and pain. Healthier options include:   Using more support, like a lumbar pillow.   Switching tasks to something that requires you to be upright or walking.   Talking a brief walk.   Lying down to rest in a neutral-spine position.  PROLONGED STANDING WHILE SLIGHTLY LEANING FORWARD  When completing a task that requires you to lean forward while standing in one place for a long time, place either foot up on a stationary 2-4 inch high object to help maintain the best posture. When both feet are on the ground, the low back tends to lose its slight inward curve.  If this curve flattens (or becomes too large), then the back and your other joints will experience too much stress, fatigue more quickly and can cause pain.   CORRECT STANDING POSTURES Proper standing posture should be assumed with all daily activities, even if they only take a few moments, like when brushing your teeth. As in sitting, your ears should fall over your shoulders and your shoulders should fall over your hips. You should keep a slight tension in your abdominal muscles to brace your spine. Your tailbone should point down to the ground, not behind your body, resulting in an over-extended swayback posture.     INCORRECT STANDING POSTURES  Common incorrect standing postures include a forward head, locked knees and/or an excessive swayback. WALKING Walk with an upright posture. Your ears, shoulders and hips should all line-up. PROLONGED ACTIVITY IN A FLEXED POSITION When completing a task that requires you to bend forward at your waist or lean over a low surface, try to find a way to stabilize 3 of 4 of your limbs. You can place a hand or elbow on your thigh or rest a knee on the surface you are reaching across. This will provide you more stability so that your muscles do not fatigue as quickly. By keeping your knees relaxed, or slightly bent, you will also reduce stress across your low back. CORRECT LIFTING TECHNIQUES DO :   Assume a wide stance. This will provide you more stability and the opportunity to get as close as possible to the object which you are lifting.   Tense your abdominals to brace your spine; then bend at the knees and hips. Keeping your back locked in a neutral-spine position, lift using your leg muscles. Lift with your legs, keeping your back straight.   Test the weight of unknown objects before attempting to lift them.   Try to keep your elbows locked down at your sides in order get the best strength from your shoulders when carrying an object.   Always ask for help when lifting heavy or awkward objects.  INCORRECT LIFTING TECHNIQUES DO NOT:   Lock your knees when lifting, even if it is a small object.   Bend and twist. Pivot at your feet or move your feet when needing to change directions.   Assume that you cannot safely pick up a paperclip without proper posture.  Document Released: 10/22/2005 Document Revised: 01/14/2012 Document Reviewed: 02/03/2009 [facility] Patient Information 2013 [place], [place].

## 2012-09-24 NOTE — Telephone Encounter (Signed)
Ash, This medication can cause abdominal pain but not chest pain. I do not think her chest pain is related to the medication. I will evaluate her at the office this afternoon. Rene Kocher

## 2012-09-24 NOTE — Telephone Encounter (Signed)
Patient Information:  Caller Name: Virginia Shaw  Phone: [phone]  Patient: Virginia, Shaw  Gender: Female  DOB: 01-13-1966  Age: 46 Years  PCP: Oliver Barre (Adults only)  Pregnant: No   Symptoms  Reason For Call & Symptoms: on Septra DS for UTI, having same issues with pain in right chest area  Reviewed Health History In EMR: Yes  Reviewed Medications In EMR: Yes  Reviewed Allergies In EMR: Yes  Date of Onset of Symptoms: 09/23/2012 OB:  LMP: Unknown  Guideline(s) Used:  Chest Pain  Disposition Per Guideline:   See Today in Office  Reason For Disposition Reached:   All other patients with chest pain  Advice Given:  N/A  Office Follow Up:  Does the office need to follow up with this patient?: No  Instructions For The Office: N/A  Appointment Scheduled:  09/24/2012 14:00:57  RN Note:  Has had constant " chest pain for years now", it is just worse then before and she is asking if it is related to the medication?  Has bleeding from her gums at times.

## 2012-10-08 ENCOUNTER — Encounter: Payer: Self-pay | Admitting: Cardiovascular Disease

## 2012-10-08 ENCOUNTER — Ambulatory Visit (INDEPENDENT_AMBULATORY_CARE_PROVIDER_SITE_OTHER): Payer: BC Managed Care – PPO | Admitting: Cardiovascular Disease

## 2012-10-08 VITALS — BP 102/76 | HR 83 | Ht 61.0 in | Wt 132.8 lb

## 2012-10-08 DIAGNOSIS — R0789 Other chest pain: Secondary | ICD-10-CM

## 2012-10-08 NOTE — Patient Instructions (Addendum)
Your physician recommends that you schedule a follow-up appointment in: per second opinion- return to primary cardiologist.   Your physician recommends that you continue on your current medications as directed. Please refer to the Current Medication list given to you today.

## 2012-10-08 NOTE — Progress Notes (Signed)
Virginia Shaw Date of Birth  [birth year]       [facility]    [facility] [address]  [address] [place], [place]  [address]   [place], [place]  [address] [phone]     [phone]   Fax  [phone]    Fax [phone]  Problem List: 1. Chest pain -normal cath by Dr. Karleen Hampshire (08/16/2010).  Negative Isopril challenge- Dr. Sandy Salaam  (04/23/11).   She has seen Dr. Antoine Poche in our group.  His last notes indicate that she was adequately treated from a medication standpoint. His last note also indicates that she should go to [facility] for another opinion if she has further pain.  2. chronic pain syndrome  History of Present Illness:  Jurnee is a 46 yo with hx of CP.  She has had an extensive work up including cath and Isopril challenge which were negative.   She had a negative stress test in the past.    She has continued to have left arm spasms which radiate down to her chest.  These chest pains occur when she is urinating or having a bowel movement.  They can also occur when she is walking. These pains occur daily.  These pains have not changed in character or intensity over the past several years. They occur daily. They typically are relieved with rest and/or nitroglycerin. She has seen a total of 3 different cardiologist previously so far for this identical problem.  She was told in the past that we did not have anything further to offer but we suggested that she seek further opinions at [facility] or the [facility]. She did not make an  Appointment at [facility]  but instead came to see me today.  Current Outpatient Prescriptions on File Prior to Visit  Medication Sig Dispense Refill  . amLODipine (NORVASC) 5 MG tablet Take 5 mg by mouth daily.      Marland Kitchen aspirin 81 MG chewable tablet Chew 81 mg by mouth daily.      . isosorbide mononitrate (IMDUR) 30 MG 24 hr tablet Take 1/2 tablet every  morning.      . nitroGLYCERIN (NITROSTAT) 0.4 MG SL tablet Place 0.4 mg under the tongue every 5 (five) minutes as needed.        Allergies  Allergen Reactions  . Ceftin (Cefuroxime Axetil)   . Cefuroxime Axetil   . Cephalexin   . Codeine   . Diltiazem   . Ibuprofen   . Macrobid (Nitrofurantoin Monohyd Macro) Other (See Comments)    Numbness in mouth  . Maxalt (Rizatriptan)   . Nsaids   . Omeprazole   . Penicillins   . Phenazopyridine Hcl   . Protonix (Pantoprazole Sodium)     Past Medical History  Diagnosis Date  . Abnormal involuntary movements   . Allergic rhinitis, cause unspecified   . Anxiety state, unspecified   . Cervical spondylosis without myelopathy   . Disturbance of skin sensation   . Irritable bowel syndrome   . Lumbago   . Other and unspecified angina pectoris   . Other malaise and fatigue   . Disturbance of skin sensation   . Somatization disorder   . Spasm of muscle   . Systemic sclerosis   . Unspecified hereditary and idiopathic peripheral neuropathy   . Myalgia and myositis, unspecified   . History of chicken pox   . GERD (  gastroesophageal reflux disease)     Past Surgical History  Procedure Date  . Back surgery 2001, 2011    Lumbar x 2  . Abdominal hysterectomy 1997  . Cholecystectomy   . Cervical spine surgery 2013  . Oophorectomy 1999, 2000  . Bladder surgery 2011  . Pituitary excision 2010    pituitary cyst removed    History  Smoking status  . Never Smoker   Smokeless tobacco  . Never Used    History  Alcohol Use No    Family History  Problem Relation Age of Onset  . Hypertension Father   . Hyperlipidemia Father   . Diabetes Father   . Stroke Father   . Heart attack Paternal Grandmother   . Heart attack Paternal Grandfather   . Hypertension Paternal Grandfather     Reviw of Systems:  Reviewed in the HPI.  All other systems are negative.  Physical Exam: Blood pressure 102/76, pulse 83, height 5\' 1"  (1.549 m),  weight 132 lb 12.8 oz (60.238 kg), SpO2 99.00%. General: Well developed, well nourished, in no acute distress.  Head: Normocephalic, atraumatic, sclera non-icteric, mucus membranes are moist,   Neck: Supple. Carotids are 2 + without bruits. No JVD   Lungs: Clear   Heart: RR, S1, S2  Abdomen: Soft, non-tender, non-distended with normal bowel sounds.  Msk:  Strength and tone are normal   Extremities: No clubbing or cyanosis. No edema.  Distal pedal pulses are 2+ and equal    Neuro: CN II - XII intact.  Alert and oriented X 3.   Psych:  Normal   ECG: 09/24/2012-normal sinus rhythm at 82 beats a minute. EKG is normal. She has had normal EKGs on 03/25/2012, 03/05/2011, and 08/14/2010  I reviewed the report from a cardiac catheterization Dr. Karleen Hampshire in 2011. There is a mention of coronary spasm when he placed the coronary catheters in the ostia of the right coronary artery and left main coronary artery. These resolved very quickly with IC nitroglycerin.  Have also reviewed the report of the Isopril challenge by Dr. Rudolpho Sevin which was normal.  Assessment / Plan:

## 2012-10-08 NOTE — Assessment & Plan Note (Signed)
Virginia Shaw presents with persistent episodes of chest tightness. She has seen 4 cardiologist for the same issue. She's had a heart catheterization which was normal and an Isopril challenge that was normal.  Her blood pressure and heart rate are normal and she is on adequate medical therapy-amlodipine and isosorbide.  Her blood pressure is on the low end of normal and I do not think it we could increase the isosorbide.  I strongly urged her to return to follow up with her cardiologist in [facility].  She has already been advised to make an appointment at [facility] or [facility] for further opinions but she decided not to go.   I strongly suggested that she take their advice and go to [facility] or perhaps the [facility] for further opinions if she continues to have episodes of chest pain. At this point I think that her medical therapy is on target.  I do not think that any additional testing is warranted.  I have not made her a return appointment.

## 2013-04-06 ENCOUNTER — Ambulatory Visit
Admission: RE | Admit: 2013-04-06 | Discharge: 2013-04-06 | Disposition: A | Discharge status: Home / Self Care | Payer: BC Managed Care – PPO | Source: Ambulatory Visit | Attending: *Deleted | Admitting: *Deleted

## 2013-04-06 ENCOUNTER — Other Ambulatory Visit: Payer: Self-pay | Admitting: *Deleted

## 2013-04-06 DIAGNOSIS — G510 Bell's palsy: Secondary | ICD-10-CM

## 2013-04-06 DIAGNOSIS — G501 Atypical facial pain: Secondary | ICD-10-CM

## 2013-04-06 MED ORDER — IOHEXOL 300 MG/ML  SOLN
75.0000 mL | Freq: Once | INTRAMUSCULAR | Status: AC | PRN
  Administered 2013-04-06: 75 mL via INTRAVENOUS

## 2013-05-01 ENCOUNTER — Ambulatory Visit: Payer: BC Managed Care – PPO | Admitting: Neurology

## 2013-07-13 ENCOUNTER — Other Ambulatory Visit: Payer: Self-pay | Admitting: Family

## 2013-07-13 ENCOUNTER — Ambulatory Visit
Admission: RE | Admit: 2013-07-13 | Discharge: 2013-07-13 | Disposition: A | Discharge status: Home / Self Care | Payer: BC Managed Care – PPO | Source: Ambulatory Visit | Attending: Family | Admitting: Family

## 2013-07-13 DIAGNOSIS — R079 Chest pain, unspecified: Secondary | ICD-10-CM

## 2013-07-21 ENCOUNTER — Other Ambulatory Visit (HOSPITAL_COMMUNITY): Payer: Self-pay | Admitting: Endocrinology

## 2013-07-21 DIAGNOSIS — E0590 Thyrotoxicosis, unspecified without thyrotoxic crisis or storm: Secondary | ICD-10-CM

## 2013-08-10 ENCOUNTER — Ambulatory Visit (HOSPITAL_COMMUNITY)
Admission: RE | Admit: 2013-08-10 | Discharge: 2013-08-10 | Disposition: A | Discharge status: Home / Self Care | Payer: BC Managed Care – PPO | Source: Ambulatory Visit | Attending: Endocrinology | Admitting: Endocrinology

## 2013-08-10 DIAGNOSIS — E0590 Thyrotoxicosis, unspecified without thyrotoxic crisis or storm: Secondary | ICD-10-CM

## 2013-08-10 DIAGNOSIS — E0500 Thyrotoxicosis with diffuse goiter without thyrotoxic crisis or storm: Secondary | ICD-10-CM | POA: Insufficient documentation

## 2013-08-11 ENCOUNTER — Ambulatory Visit (HOSPITAL_COMMUNITY)
Admission: RE | Admit: 2013-08-11 | Discharge: 2013-08-11 | Disposition: A | Discharge status: Home / Self Care | Payer: BC Managed Care – PPO | Source: Ambulatory Visit | Attending: Endocrinology | Admitting: Endocrinology

## 2013-08-11 MED ORDER — SODIUM PERTECHNETATE TC 99M INJECTION
9.9000 | Freq: Once | INTRAVENOUS | Status: AC | PRN
  Administered 2013-08-11: 10 via INTRAVENOUS

## 2013-08-11 MED ORDER — SODIUM IODIDE I 131 CAPSULE
9.5000 | Freq: Once | INTRAVENOUS | Status: AC | PRN
  Administered 2013-08-11: 9.5 via ORAL

## 2013-09-23 ENCOUNTER — Other Ambulatory Visit: Payer: Self-pay | Admitting: Vascular Surgery

## 2013-09-23 DIAGNOSIS — I83893 Varicose veins of bilateral lower extremities with other complications: Secondary | ICD-10-CM

## 2013-09-28 ENCOUNTER — Encounter: Payer: Self-pay | Admitting: Vascular Surgery

## 2013-10-05 ENCOUNTER — Ambulatory Visit (INDEPENDENT_AMBULATORY_CARE_PROVIDER_SITE_OTHER): Payer: BC Managed Care – PPO | Admitting: Cardiology

## 2013-10-05 ENCOUNTER — Encounter: Payer: Self-pay | Admitting: Cardiology

## 2013-10-05 VITALS — BP 94/60 | HR 69 | Ht 60.0 in | Wt 131.0 lb

## 2013-10-05 DIAGNOSIS — G8929 Other chronic pain: Secondary | ICD-10-CM

## 2013-10-05 DIAGNOSIS — R0789 Other chest pain: Secondary | ICD-10-CM

## 2013-10-05 DIAGNOSIS — I201 Angina pectoris with documented spasm: Secondary | ICD-10-CM

## 2013-10-05 NOTE — Progress Notes (Signed)
[address] [place], [place]  [address] Phone: [phone] Fax:  [phone]  Date:  10/05/2013   ID:  Virginia Shaw, DOB 1966/07/06, MRN [id number]  PCP:  Allean Found, MD   History of Present Illness: Virginia Shaw is a 47 y.o. 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 Rudolpho Sevin at [facility] and Dr. Okey Dupre at [facility]. It seems like on 09/15/13 she established care with Dr. Merri Brunette because of ongoing health issues.  She had heart catheterization in 2011. She was diagnosed with coronary vasospasm. She was placed on daily isosorbide as well as amlodipine. She was told that she also had venous spasms in her legs as well. Had sharp pain in upper right rib for a long time, low right rib, very sharp at times. Gall bladder removed, worked to help with pain for a few months. If stretch when hurts it takes a good 10-64min before goes away.   In winter, symptoms are worse. Different meds have effected her bladder. Tries to walk but will get leg pains. ASA helps somewhat. When sit down. BP drops when standing at long time. Leg pain when sitting.   Left neck pain. With BM will have chest spasm. More so in the middle of chest.    Wt Readings from Last 3 Encounters:  10/05/13 131 lb (59.421 kg)  10/08/12 132 lb 12.8 oz (60.238 kg)  09/24/12 131 lb (59.421 kg)     Past Medical History  Diagnosis Date  . Abnormal involuntary movements(781.0)   . Allergic rhinitis, cause unspecified   . Anxiety state, unspecified   . Cervical spondylosis without myelopathy   . Disturbance of skin sensation   . Irritable bowel syndrome   . Lumbago   . Other and unspecified angina pectoris   . Other malaise and fatigue   . Disturbance of skin sensation   . Somatization disorder   . Spasm of muscle   . Systemic sclerosis   . Unspecified hereditary and idiopathic  peripheral neuropathy   . Myalgia and myositis, unspecified   . History of chicken pox   . GERD (gastroesophageal reflux disease)     Past Surgical History  Procedure Laterality Date  . Back surgery  2001, 2011    Lumbar x 2  . Abdominal hysterectomy  1997  . Cholecystectomy    . Cervical spine surgery  2013  . Oophorectomy  1999, 2000  . Bladder surgery  2011  . Pituitary excision  2010    pituitary cyst removed    Current Outpatient Prescriptions  Medication Sig Dispense Refill  . amLODipine (NORVASC) 5 MG tablet Take 5 mg by mouth 2 (two) times daily.       Marland Kitchen aspirin 81 MG chewable tablet Chew 81 mg by mouth as needed.       . isosorbide mononitrate (IMDUR) 30 MG 24 hr tablet Take 1/2 tablet every morning.      . nitroGLYCERIN (NITROSTAT) 0.4 MG SL tablet Place 0.4 mg under the tongue every 5 (five) minutes as needed.       No current facility-administered medications for this visit.    Allergies:    Allergies  Allergen Reactions  . Ceftin [Cefuroxime Axetil]   . Cefuroxime Axetil   . Cephalexin   . Codeine   . Diltiazem   . Ibuprofen   . Macrobid [employer] Macro] Other (  See Comments)    Numbness in mouth  . Maxalt [Rizatriptan]   . Nsaids   . Omeprazole   . Penicillins   . Phenazopyridine Hcl   . Protonix [Pantoprazole Sodium]     Social History:  The patient  reports that she has never smoked. She has never used smokeless tobacco. She reports that she does not drink alcohol or use illicit drugs.   Family History  Problem Relation Age of Onset  . Hypertension Father   . Hyperlipidemia Father   . Diabetes Father   . Stroke Father   . Heart attack Paternal Grandmother   . Heart attack Paternal Grandfather   . Hypertension Paternal Grandfather     ROS:  Please see the history of present illness.   Positive for chronic abdominal pain, left lower leg pain, history of vasospasm, lightheadedness, denies any bleeding, syncope, orthopnea.   All  other systems reviewed and negative.   PHYSICAL EXAM: VS:  BP 94/60  Pulse 69  Ht 5' (1.524 m)  Wt 131 lb (59.421 kg)  BMI 25.58 kg/m2  SpO2 97% Well nourished, well developed, in no acute distress HEENT: normal, [place]/AT, EOMI Neck: no JVD, normal carotid upstroke, no bruit Cardiac:  normal S1, S2; RRR; no murmur Lungs:  clear to auscultation bilaterally, no wheezing, rhonchi or rales Abd: soft, nontender, no hepatomegaly, no bruits Ext: no edema, 2+ distal pulses Skin: warm and dry GU: deferred Neuro: no focal abnormalities noted, AAO x 3  EKG:  Sinus rhythm heart rate 69 with no other abnormalities.  Prior medical records reviewed.   ASSESSMENT AND PLAN:  1. Previously diagnosed coronary vasospasm-prior cardiac catheterization reassuring. Her right-sided chest discomfort could certainly be musculoskeletal in character. Possibly neuropathic. We have also discussed the possibility of esophageal spasm versus coronary vasospasm. Nevertheless, she is being treated for the possibility of both with amlodipine as well as isosorbide. There is no room to increase the medication because of relative hypotension. Her association with bowel movement may be related to straining/muscular spasm. Could there be a vagal precipitant? Left neck pain could also be radiation of chest wall/musculoskeletal discomfort. Given her recent cardiac catheterization, I do not feel that there is any further testing warranted at this time. I discussed that sometimes verapamil is used in this setting as an alternative to amlodipine. I also suggested 10-15 minutes a day of exercise/walking. Her EKG today is unremarkable. Excellent. I would not advocate any further cardiac testing at this time. She will continue to follow with her primary cardiologist Dr. Rudolpho Sevin.   Signed, Donato Schultz, MD [facility]  10/05/2013 2:23 PM

## 2013-10-05 NOTE — Patient Instructions (Signed)
Your physician recommends that you schedule a follow-up appointment in: AS NEEDED  Your physician recommends that you continue on your current medications as directed. Please refer to the Current Medication list given to you today.  

## 2013-10-27 ENCOUNTER — Telehealth: Payer: Self-pay | Admitting: Vascular Surgery

## 2013-10-27 NOTE — Telephone Encounter (Addendum)
Message copied by Fredrich Birks on Tue Oct 27, 2013 10:57 AM ------      Message from: Melene Plan      Created: Thu Oct 22, 2013 10:25 AM                   ----- Message -----         From: Fransisco Hertz, MD         Sent: 10/22/2013   7:52 AM           To: Melene Plan, RN            Virginia Shaw      [id number]      1966-02-01            Pt is on my scheduled for my 10/30/13 office but is listed as TFE only.  Reschedule to Dr. Bosie Helper schedule ------  10/27/13: Pt was originally scheduled to see TFE, but Dr Merri Brunette requested that she be worked in earlier. We scheduled with next available MD, ok per Dr Katrinka Blazing. I have left an additional message with Dr Lonn Georgia office to confirm that Ms Rane will be seeing Dr Imogene Burn. dpm

## 2013-10-28 ENCOUNTER — Encounter: Payer: Self-pay | Admitting: Vascular Surgery

## 2013-10-30 ENCOUNTER — Encounter: Payer: Self-pay | Admitting: Vascular Surgery

## 2013-10-30 ENCOUNTER — Ambulatory Visit (HOSPITAL_COMMUNITY)
Admission: RE | Admit: 2013-10-30 | Discharge: 2013-10-30 | Disposition: A | Discharge status: Home / Self Care | Payer: BC Managed Care – PPO | Source: Ambulatory Visit | Attending: Vascular Surgery | Admitting: Vascular Surgery

## 2013-10-30 ENCOUNTER — Ambulatory Visit (INDEPENDENT_AMBULATORY_CARE_PROVIDER_SITE_OTHER): Payer: BC Managed Care – PPO | Admitting: Vascular Surgery

## 2013-10-30 VITALS — BP 101/66 | HR 78 | Resp 14 | Ht 60.0 in | Wt 128.0 lb

## 2013-10-30 DIAGNOSIS — I8390 Asymptomatic varicose veins of unspecified lower extremity: Secondary | ICD-10-CM | POA: Insufficient documentation

## 2013-10-30 DIAGNOSIS — I83893 Varicose veins of bilateral lower extremities with other complications: Secondary | ICD-10-CM

## 2013-10-30 DIAGNOSIS — I872 Venous insufficiency (chronic) (peripheral): Secondary | ICD-10-CM | POA: Insufficient documentation

## 2013-10-30 DIAGNOSIS — M79609 Pain in unspecified limb: Secondary | ICD-10-CM

## 2013-10-30 NOTE — Progress Notes (Signed)
Referred by:  Allean Found, MD [address] [place], [place] [address]  Reason for referral: Painful L leg veins  History of Present Illness  Virginia Shaw is a 47 y.o. ([birth year]) female s/p multiple spinal procedures who presents with chief complaint: Painful left leg veins.  Patient notes, onset of months ago, associated with no obvious trigger.  The patient's symptoms include: radicular sx in L thigh with numbness and tingling and spasms in "veins".  The patient has had no history of DVT, known history of pregnancy, known history of varicose vein, no history of venous stasis ulcers, no history of  Lymphedema and no history of skin changes in lower legs.  There is known family history of venous disorders.  The patient has used compression stockings in the past: by report it increased her coronary spasms  Past Medical History  Diagnosis Date  . Abnormal involuntary movements(781.0)   . Allergic rhinitis, cause unspecified   . Anxiety state, unspecified   . Cervical spondylosis without myelopathy   . Disturbance of skin sensation   . Irritable bowel syndrome   . Lumbago   . Other and unspecified angina pectoris   . Other malaise and fatigue   . Disturbance of skin sensation   . Somatization disorder   . Spasm of muscle   . Systemic sclerosis   . Unspecified hereditary and idiopathic peripheral neuropathy   . Myalgia and myositis, unspecified   . History of chicken pox   . GERD (gastroesophageal reflux disease)     Past Surgical History  Procedure Laterality Date  . Back surgery  2001, 2011    Lumbar x 2  . Abdominal hysterectomy  1997  . Cholecystectomy    . Cervical spine surgery  2013  . Oophorectomy  1999, 2000  . Bladder surgery  2011  . Pituitary excision  2010    pituitary cyst removed  . Craniectomy suboccipital w/ cervical laminectomy / chiari  2012    By Dr. Dutch Quint    History   Social History  . Marital Status: Married    Spouse  Name: N/A    Number of Children: 2  . Years of Education: GED   Occupational History  .     Social History Main Topics  . Smoking status: Never Smoker   . Smokeless tobacco: Never Used  . Alcohol Use: No  . Drug Use: No  . Sexual Activity: No   Other Topics Concern  . Not on file   Social History Narrative   Lives at home with children.   Regular exercise-yes   Caffeine Use-no    Family History  Problem Relation Age of Onset  . Hypertension Father   . Hyperlipidemia Father   . Diabetes Father   . Stroke Father   . Heart attack Paternal Grandmother   . Heart attack Paternal Grandfather   . Hypertension Paternal Grandfather      Current Outpatient Prescriptions on File Prior to Visit  Medication Sig Dispense Refill  . amLODipine (NORVASC) 5 MG tablet Take 5 mg by mouth 2 (two) times daily.       Marland Kitchen aspirin 81 MG chewable tablet Chew 81 mg by mouth as needed.       . isosorbide mononitrate (IMDUR) 30 MG 24 hr tablet Take 1/2 tablet every morning.      . nitroGLYCERIN (NITROSTAT) 0.4 MG SL tablet Place 0.4 mg under the tongue every 5 (five) minutes as needed.  No current facility-administered medications on file prior to visit.    Allergies  Allergen Reactions  . Ceftin [Cefuroxime Axetil]   . Cefuroxime Axetil   . Cephalexin   . Codeine   . Diltiazem   . Ibuprofen   . Macrobid [employer] Macro] Other (See Comments)    Numbness in mouth  . Maxalt [Rizatriptan]   . Nsaids   . Omeprazole   . Penicillins   . Phenazopyridine Hcl   . Protonix [Pantoprazole Sodium]     REVIEW OF SYSTEMS:  (Positives checked otherwise negative)  CARDIOVASCULAR:  [x]  chest pain, [x]  chest pressure, []  palpitations, []  shortness of breath when laying flat, [x]  shortness of breath with exertion,  [x]  pain in feet when walking, [x]  pain in feet when laying flat, []  history of blood clot in veins (DVT), []  history of phlebitis, [x]  swelling in legs, []  varicose  veins  PULMONARY:  []  productive cough, []  asthma, []  wheezing  NEUROLOGIC:  [x]  weakness in arms or legs, [x]  numbness in arms or legs, []  difficulty speaking or slurred speech, [x]  temporary loss of vision in one eye, []  dizziness  HEMATOLOGIC:  []  bleeding problems, []  problems with blood clotting too easily  MUSCULOSKEL:  []  joint pain, []  joint swelling  GASTROINTEST:  []  vomiting blood, []  blood in stool     GENITOURINARY:  [x]  burning with urination, [x]  blood in urine  PSYCHIATRIC:  []  history of major depression  INTEGUMENTARY:  []  rashes, []  ulcers  CONSTITUTIONAL:  []  fever, []  chills  Physical Examination Filed Vitals:   10/30/13 1232  BP: 101/66  Pulse: 78  Resp: 14  Height: 5' (1.524 m)  Weight: 128 lb (58.06 kg)  SpO2: 100%   Body mass index is 25 kg/(m^2).  General: A&O x 3, WDWN  Head: [place]/AT  Ear/Nose/Throat: Hearing grossly intact, nares w/o erythema or drainage, oropharynx w/o Erythema/Exudate  Eyes: PERRLA, EOMI  Neck: Supple, no nuchal rigidity, no palpable LAD  Pulmonary: Sym exp, good air movt, CTAB, no rales, rhonchi, & wheezing  Cardiac: RRR, Nl S1, S2, no Murmurs, rubs or gallops  Vascular: Vessel Right Left  Radial Palpable Palpable  Brachial Palpable Palpable  Carotid Palpable, without bruit Palpable, without bruit  Aorta Not palpable N/A  Femoral Palpable Palpable  Popliteal Not palpable Not palpable  PT Palpable Palpable  DP Palpable Palpable   Gastrointestinal: soft, diffuse TTP, ND, -G/R, - HSM, - masses, - CVAT B  Musculoskeletal: M/S 5/5 throughout , Extremities without ischemic changes , B faint varicosities and spider veins  Neurologic: CN 2-12 intact , Pain and light touch intact in extremities , Motor exam as listed above  Psychiatric: Judgment intact, Mood & affect appropriate for pt's clinical situation  Dermatologic: See M/S exam for extremity exam, no rashes otherwise noted  Lymph : No Cervical, Axillary, or  Inguinal lymphadenopathy   Non-Invasive Vascular Imaging  BLE Venous Insufficiency Duplex (Date: 10/30/2013):   RLE: no DVT and SVT, no GSV reflux, + deep venous reflux: CFV  LLE:  no DVT and SVT, no GSV reflux, + deep venous reflux: CFV  Outside Studies/Documentation 7 pages of outside documents were reviewed including: outpatient PCP.  Medical Decision Making  Virginia Shaw is a 47 y.o. female who presents with: BLE chronic venous insufficiency (C2), s/p multiple spinal procedures   The patient only has limited CFV reflux in both legs, I doubt there is significant benefit to compression in this patient, as her swelling appears limited  anyway.  She refuses to wear the stockings anyway due to cardiac "vasospasm" associated with wearing them.  There is NO advantage to EVLA GSV in this patient as both GSV are competent.  I doubt this patient's sx are consistent with her CVI, rather they seem to be more consistent with her prior spinal patchology.  Thank you for allowing [place] to participate in this patient's care.  Leonides Sake, MD [facility] Office: [phone] Pager: [phone]  10/30/2013, 1:17 PM

## 2013-11-09 ENCOUNTER — Encounter: Payer: Self-pay | Admitting: Family Medicine

## 2013-11-17 ENCOUNTER — Encounter (HOSPITAL_COMMUNITY): Payer: BC Managed Care – PPO

## 2013-11-17 ENCOUNTER — Encounter: Payer: BC Managed Care – PPO | Admitting: Vascular Surgery

## 2013-11-19 ENCOUNTER — Ambulatory Visit: Payer: BC Managed Care – PPO | Admitting: Cardiovascular Disease

## 2013-12-28 ENCOUNTER — Ambulatory Visit (INDEPENDENT_AMBULATORY_CARE_PROVIDER_SITE_OTHER): Payer: BC Managed Care – PPO | Admitting: Surgery

## 2013-12-31 ENCOUNTER — Ambulatory Visit (INDEPENDENT_AMBULATORY_CARE_PROVIDER_SITE_OTHER): Payer: BC Managed Care – PPO | Admitting: Surgery

## 2014-01-04 ENCOUNTER — Other Ambulatory Visit: Payer: Self-pay | Admitting: Neurosurgery

## 2014-01-04 DIAGNOSIS — M4712 Other spondylosis with myelopathy, cervical region: Secondary | ICD-10-CM

## 2014-01-06 ENCOUNTER — Ambulatory Visit
Admission: RE | Admit: 2014-01-06 | Discharge: 2014-01-06 | Disposition: A | Discharge status: Home / Self Care | Payer: BC Managed Care – PPO | Source: Ambulatory Visit | Attending: Neurosurgery | Admitting: Neurosurgery

## 2014-01-06 DIAGNOSIS — M4712 Other spondylosis with myelopathy, cervical region: Secondary | ICD-10-CM

## 2014-01-11 ENCOUNTER — Encounter (INDEPENDENT_AMBULATORY_CARE_PROVIDER_SITE_OTHER): Payer: Self-pay | Admitting: Surgery

## 2014-01-11 ENCOUNTER — Ambulatory Visit (INDEPENDENT_AMBULATORY_CARE_PROVIDER_SITE_OTHER): Payer: BC Managed Care – PPO | Admitting: Surgery

## 2014-01-11 VITALS — BP 94/70 | HR 78 | Temp 98.8°F | Resp 12 | Ht 60.0 in | Wt 131.2 lb

## 2014-01-11 DIAGNOSIS — G8929 Other chronic pain: Secondary | ICD-10-CM

## 2014-01-11 DIAGNOSIS — I201 Angina pectoris with documented spasm: Secondary | ICD-10-CM

## 2014-01-11 DIAGNOSIS — K589 Irritable bowel syndrome without diarrhea: Secondary | ICD-10-CM

## 2014-01-11 DIAGNOSIS — R109 Unspecified abdominal pain: Secondary | ICD-10-CM

## 2014-01-11 DIAGNOSIS — R1013 Epigastric pain: Secondary | ICD-10-CM

## 2014-01-11 HISTORY — DX: Angina pectoris with documented spasm: I20.1

## 2014-01-11 NOTE — Progress Notes (Signed)
Subjective:     Patient ID: Virginia Shaw, female   DOB: 04/13/1966, 48 y.o.   MRN: [id number]  HPI  Note: This dictation was prepared with Dragon/digital dictation along with [employer]. Any transcriptional errors that result from this process are unintentional.       [name]  07/08/1966 [id number]  Patient Care Team: Reginia Naas, MD as PCP - General (Family Medicine) Allena Katz, MD as Attending Physician (Obstetrics and Gynecology) Mahala Menghini as Referring Physician (Cardiology) Jacelyn Pi, MD as Consulting Physician (Endocrinology) Marlou Starks as Referring Physician (Urology) Coral Spikes as Consulting Physician (Gastroenterology) Jackquline Denmark, MD as Consulting Physician (Gastroenterology) Lear Ng, MD as Consulting Physician (Gastroenterology) Sable Feil, MD as Consulting Physician (Gastroenterology) Charlie Pitter, MD as Consulting Physician (Neurosurgery) Juanda Bond. Lininger as Set designer (Surgery) Candee Furbish, MD as Consulting Physician (Cardiology) Carlisle Cater as Consulting Physician (Cardiology)  This patient is a 48 y.o.female who presents today for surgical evaluation at the request of Dr. Tamala Julian.   Reason for visit: Abdominal pain with irregular bowel habits and prior surgeries.  Concern for bowel obstruction.  Patient with numerous prior back and pelvic surgeries to struggled with intermittent chest abdominal pain for a long time.  She has also had intermittent episodes of sharp chest pain.  He describes right chest wall pain.  She also describes substernal type chest pain.  She has had aggressive cardiac workup and seen for cardiologists and pounded one at [facility].  No evidence of any obstruction or coronary disease.  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  On chronic nitrates.  That has helped a little bit but she still struggles.  She had a workup for possible biliary dyskinesia for the  RUQ/rib pains.  Normal ultrasound.  Normal HIDA.  Underwent cholecystectomy by Dr. Noberto Retort.  She felt like that helped her for the first 3 months but then the pains all came back.  Some days she feels like it is worse.  Sometimes the same.  She describes episodes of severe chest pain that radiate down to her abdomen.  Some episodes of diarrhea as well.  Sometimes a bowel movement triggers her chest pain.  She prefers to be constipated.  She noted she has chronic abdominal pain.  It is diffuse.  No region is spared.  The abdomen feels tender to touch and achey.  She can get low back pain with bowel movements as well.  No major rectal bleeding.  She has seen every GI group in [place] ([place], [place], [place], [facility], etc) as noted above.  Lately she seems to be using Dr. Virgel Bouquet in [facility].  She has had numerous upper and lower endoscopies.  At one point she was diagnosed with Helicobacter pyloric gastritis.  She has had treatments in the past.  Most recent evaluation 2010 showed biopsies were negative for H pylori.  She has had normal colonoscopies in the past 13 years.  There was an attempted repeat.  She claims she had severe chest pain with the bowel prep and happen.  It is hard to pin down the stairs specific foods such as dairy, gluten, spicy, or other foods that trigger were exacerbated vents.  In general does not seem to be triggered by eating.  She struggled with bloating and irritation with fiber supplements in the past.  She claims she used to have moderate heartburn and reflux.  EGD done a few years ago.  Has no problems since.  She does not know if she had dilations or anything else done.  I see 3 CAT scans in the past 15 years through [facility].  All normal.  She has tried intermittent narcotics in the past without much help.  She sternum and muscle relaxants with some mixed help.  Patient Active Problem List   Diagnosis Date Noted  . Irritable bowel syndrome (IBS)  01/12/2014  . 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 Chronic abdominal pain 01/11/2014  . Chronic venous insufficiency 10/30/2013  . Varicose veins 10/30/2013  . Chest tightness 03/25/2012  . 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 RECTAL BLEEDING 08/26/2008  . UTI'S, RECURRENT 08/26/2008    Past Medical History  Diagnosis Date  . Abnormal involuntary movements(781.0)   . Allergic rhinitis, cause unspecified   . Anxiety state, unspecified   . Cervical spondylosis without myelopathy   . Disturbance of skin sensation   . Irritable bowel syndrome   . Lumbago   . Other and unspecified angina pectoris   . Other malaise and fatigue   . Disturbance of skin sensation   . Somatization disorder   . Spasm of muscle   . Systemic sclerosis   . Unspecified hereditary and idiopathic peripheral neuropathy   . Myalgia and myositis, unspecified   . History of chicken pox   . GERD (gastroesophageal reflux disease)   . Osteoporosis   . 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 Back surgery  2001, 2011    Lumbar x 2  . Abdominal hysterectomy  1997  . Cholecystectomy    . Cervical spine surgery  2013  . Oophorectomy  1999, 2000  . Bladder surgery  2011  . Pituitary excision  2010    pituitary cyst removed  . Craniectomy suboccipital w/ cervical laminectomy / chiari  2012    Dr Annette Stable, Chiari decompression with suboccipital craniectomy and  . Tubal ligation    . Cystocele repair  2002    Dr Gaetano Net  . Rectocele repair  2002    Dr. Gaetano Net  . Colonoscopy  2009    "Normal" Dr Sharlett Iles  . Colonoscopy  2002    "Normal" Dr Freddrick March    History   Social History  . Marital Status: Married    Spouse Name: N/A    Number of Children: 2  . Years of Education: GED   Occupational History  .     Social History Main Topics  . Smoking status: Never Smoker   . Smokeless tobacco: Never Used  . Alcohol Use: No  . Drug Use: No  . Sexual Activity: No    Other Topics Concern  . Not on file   Social History Narrative   Lives at home with children.   Regular exercise-yes   Caffeine Use-no    Family History  Problem Relation Age of Onset  . Hypertension Father   . Hyperlipidemia Father   . Diabetes Father   . Stroke Father   . Heart attack Paternal Grandmother   . Heart attack Paternal Grandfather   . Hypertension Paternal Grandfather   . Cancer Maternal Uncle     lung  . Cancer Paternal Aunt     lung    Current Outpatient Prescriptions  Medication Sig Dispense Refill  . amLODipine (NORVASC) 5 MG tablet Take 5 mg by mouth 2 (two) times daily.       Marland Kitchen aspirin 81 MG chewable tablet Chew 81 mg  by mouth as needed.       . nitroGLYCERIN (NITROSTAT) 0.4 MG SL tablet Place 0.4 mg under the tongue every 5 (five) minutes as needed.       No current facility-administered medications for this visit.     Allergies  Allergen Reactions  . Ceftin [Cefuroxime Axetil]   . Cefuroxime Axetil   . Cephalexin   . Codeine   . Diltiazem   . Ibuprofen   . Macrobid [employer] Macro] Other (See Comments)    Numbness in mouth  . Maxalt [Rizatriptan]   . Nsaids   . Omeprazole   . Penicillins   . Phenazopyridine Hcl   . Protonix [Pantoprazole Sodium]     BP 94/70  Pulse 78  Temp(Src) 98.8 F (37.1 C)  Resp 12  Ht 5' (1.524 m)  Wt 131 lb 3.2 oz (59.512 kg)  BMI 25.62 kg/m2  Mr Cervical Spine Wo Contrast  01/06/2014   CLINICAL DATA:  Increasing neck pain. Right hand weakness and tingling.  EXAM: MRI CERVICAL SPINE WITHOUT CONTRAST  TECHNIQUE: Multiplanar, multisequence MR imaging was performed. No intravenous contrast was administered.  COMPARISON:  CT HEAD WO/W CM dated 04/06/2013; MR C SPINE W/O CM dated 07/31/2012; DG CERVICAL SPINE 1V dated 02/25/2012; MR HEAD WO/W CM dated 02/25/2012; MR CERVICAL SPINE W/O CM dated 12/31/2011  FINDINGS: Suboccipital craniectomy appears uncomplicated without tonsillar herniation. Previous  transsphenoidal approach to the pituitary with sphenoidal mucosal thickening and fat graft placement. Anatomic alignment cervical spine. Previous C5-7 fusion. No neck masses. Flow voids are maintained in the bilateral vertebral arteries.  Amputation posterior arch of C1.  At C2-3 and C3-4, no abnormalities are detected.  At C4-5, there is mild annular bulging and mild facet arthropathy without impingement.  C5-6 and C6-7 levels appear unremarkable.  At C7-T1, there is no disc disease or significant facet arthropathy. Normal-appearing upper thoracic levels.  Compared with priors, a similar appearance is noted.  IMPRESSION: No right-sided compressive lesion is evident. Stable appearance status post C5-C6-C7 fusion.   Electronically Signed   By: Rolla Flatten M.D.   On: 01/06/2014 13:05     Review of Systems  Constitutional: Negative for fever, chills, diaphoresis, appetite change and fatigue.  HENT: Positive for congestion and hearing loss. Negative for ear discharge, ear pain, sore throat and trouble swallowing.   Eyes: Positive for visual disturbance. Negative for photophobia and discharge.  Respiratory: Positive for chest tightness. Negative for cough, choking, shortness of breath, wheezing and stridor.   Cardiovascular: Positive for chest pain and leg swelling. Negative for palpitations.  Gastrointestinal: Positive for abdominal pain, constipation, abdominal distention and rectal pain. Negative for nausea, vomiting, diarrhea and anal bleeding.  Endocrine: Negative for cold intolerance and heat intolerance.  Genitourinary: Positive for flank pain and difficulty urinating. Negative for dysuria and frequency.  Musculoskeletal: Positive for arthralgias, back pain, myalgias and neck pain. Negative for gait problem.       Right hand pain/numbness/weakness/tingling  Skin: Negative for color change, pallor and rash.  Allergic/Immunologic: Negative for environmental allergies, food allergies and  immunocompromised state.  Neurological: Positive for dizziness, weakness and headaches. Negative for speech difficulty and numbness.  Hematological: Negative for adenopathy.  Psychiatric/Behavioral: Negative for suicidal ideas, hallucinations, confusion, dysphoric mood and agitation. The patient is not nervous/anxious.        Objective:   Physical Exam  Constitutional: She is oriented to person, place, and time. She appears well-developed and well-nourished. No distress.  HENT:  Head: Normocephalic.  Mouth/Throat: Oropharynx is clear and moist. No oropharyngeal exudate.  Eyes: Conjunctivae and EOM are normal. Pupils are equal, round, and reactive to light. No scleral icterus.  Neck: Normal range of motion. Neck supple. No tracheal deviation present.  Cardiovascular: Normal rate, regular rhythm and intact distal pulses.   Pulmonary/Chest: Effort normal and breath sounds normal. No stridor. No respiratory distress. She exhibits no tenderness.  Abdominal: Soft. Bowel sounds are normal. She exhibits no distension, no abdominal bruit and no mass. There is tenderness in the right upper quadrant, right lower quadrant, epigastric area, periumbilical area, left upper quadrant and left lower quadrant. There is no rigidity, no rebound, no guarding, no CVA tenderness, no tenderness at McBurney's point and negative Murphy's sign. No hernia. Hernia confirmed negative in the ventral area, confirmed negative in the right inguinal area and confirmed negative in the left inguinal area.    Genitourinary: Pelvic exam was performed with patient supine. No vaginal discharge found.  Musculoskeletal: Normal range of motion. She exhibits no tenderness.       Right elbow: She exhibits normal range of motion.       Left elbow: She exhibits normal range of motion.       Right wrist: She exhibits normal range of motion.       Left wrist: She exhibits normal range of motion.       Right hand: Normal strength noted.        Left hand: Normal strength noted.  Lymphadenopathy:       Head (right side): No posterior auricular adenopathy present.       Head (left side): No posterior auricular adenopathy present.    She has no cervical adenopathy.    She has no axillary adenopathy.       Right: No inguinal adenopathy present.       Left: No inguinal adenopathy present.  Neurological: She is alert and oriented to person, place, and time. No cranial nerve deficit. She exhibits normal muscle tone. Coordination normal.  Skin: Skin is warm and dry. No rash noted. She is not diaphoretic. No erythema.  Psychiatric: She has a normal mood and affect. Judgment normal. Her mood appears not anxious. Her affect is not angry. Her speech is not slurred. She is not slowed, not withdrawn, not actively hallucinating and not combative. Thought content is not paranoid and not delusional. Cognition and memory are normal. She expresses no homicidal and no suicidal ideation. She expresses no suicidal plans and no homicidal plans. She is communicative.  Very pleasant.  Often interrupts to add more history  She is attentive.       Assessment:     Diffuse chronic abdominal and chest pain of uncertain etiology.  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  I spent ~90 minutes reviewing the old charts, EMR, history, discussing with the patient, examination, etc.  She tends to shop around on doctors getting 4-5 opinions.  This makes it challenge to know what has been done and what has been recommended.   In a patient with prior abdominal surgeries and back surgeries, digestive tract dysmotility as a common issue.  I further recommend that she obtain records from all the doctor she is seen and keep them with her.  We will try and get records of what has been done in the past 10 years.  Gastric emptying study to make sure she is  not have gastric emptying  issues.  I recommended bowel regimen.  Consider fiber alternatives such as flaxseed or MiraLAX.  Probiotics.  The goal is more regular bowel movements.  Be strictly adherent to that for at least 2 months to allow her bowels to adapt & improve.  She tells me that her bowels normal now (BMs Q2D-BID) then they have been in the past & still struggles.  Consider IBS bland diet.  I strongly recommend she pick a medical system to help take care of her & [place] for a few years to help sort her out.  It is a challenge to know what has been done, worked/not worked, Social research officer, government.  Technical brewer with a GI doc.  She tells me she prefers Dr. Ferdinand Lango at this time.  See if GI has any recommendations for anti-spasmodics, IBS like therapies.  She could benefit from pain [facility] gastrology evaluation at [facility] due per week for.  Discussed her case with Dr. Gae Gallop with [facility].  He is not aware of any Prinzmetal type spasmodic analog in the mesenteric vessels in the abdomen.  Intestinal migraines are rare & mainly seen in children.  Discussed with other surgerons in the group (Dr Dalbert Batman, Excell Seltzer, [place], etc) as well.  I am skeptical that anything like ergotamine would work.  I would want her to discuss with a majoracedemic center [facility], [facility], [facility]) first.  She is already on nitrates and calcium channel blockers which seemed to be supportive for any arterial spasm issues.  If her bowels are more regular, she is compliant on an IBS diet, and she has no improvement with GI antispasmodics; then, consider CT enterrhography to rule out any mechanical source of obstruction.  Could do this if she has an episode of bloating and nausea and vomiting.  I see no documented evidence of SBO in the past, therefore am skeptical that another operation is in her best interest.  She has had plenty already.  I think that there is a psychosocial stress factor to this.  I had a frank discussion with her about this.  She seems  occasionally close to tearful & depressed to me.  She seemed surprised when I brought up the concerns of anxiety and depression and stress.  She tells me she "tries laugh it off."  There is some evidence that low-dose neuropathic pain medications with an antidepressant such as amitriptyline or an SSRI can be of use in IBS/chronic abdominal pain.  I stressed to her that I was not saying that she was crazy.  With all the issues & VERY ++++ROS she has, she deserves to explore better psychological and social support.  I did not push the issue.

## 2014-01-11 NOTE — Patient Instructions (Signed)
Please consider the recommendations that we have given you today:  Consider changing bowel regimen to flaxseed which produces less gas.  Container and a active yeast culture acidophilus probiotic.  Discussed with her gastroenterologist about a better bowel regimen.  We will order a gastric emptying study to make sure your stomach empties normally.  Probably will order a CT enterography to evaluate any evidence of blockage in your intestines  Please obtain records from her gastroenterologist and surgeons, especially Dr. Ferdinand Lango and Dr. Noberto Retort.  See the Handout(s) we have given you.  Please call our office at [phone] if you wish to schedule surgery or if you have further questions / concerns.   GETTING TO GOOD BOWEL HEALTH. Irregular bowel habits such as constipation and diarrhea can lead to many problems over time.  Having one soft bowel movement a day is the most important way to prevent further problems.  The anorectal canal is designed to handle stretching and feces to safely manage our ability to get rid of solid waste (feces, poop, stool) out of our body.  BUT, hard constipated stools can act like ripping concrete bricks and diarrhea can be a burning fire to this very sensitive area of our body, causing inflamed hemorrhoids, anal fissures, increasing risk is perirectal abscesses, abdominal pain/bloating, an making irritable bowel worse.     The goal: ONE SOFT BOWEL MOVEMENT A DAY!  To have soft, regular bowel movements:    Drink at least 8 tall glasses of water a day.     Take plenty of fiber.  Fiber is the undigested part of plant food that passes into the colon, acting s "natures broom" to encourage bowel motility and movement.  Fiber can absorb and hold large amounts of water. This results in a larger, bulkier stool, which is soft and easier to pass. Work gradually over several weeks up to 6 servings a day of fiber (25g a day even more if needed) in the form of: o Vegetables -- Root  (potatoes, carrots, turnips), leafy green (lettuce, salad greens, celery, spinach), or cooked high residue (cabbage, broccoli, etc) o Fruit -- Fresh (unpeeled skin & pulp), Dried (prunes, apricots, cherries, etc ),  or stewed ( applesauce)  o Whole grain breads, pasta, etc (whole wheat)  o Bran cereals    Bulking Agents -- This type of water-retaining fiber generally is easily obtained each day by one of the following:  o Psyllium bran -- The psyllium plant is remarkable because its ground seeds can retain so much water. This product is available as Metamucil, Konsyl, Effersyllium, Per Diem Fiber, or the less expensive generic preparation in drug and health food stores. Although labeled a laxative, it really is not a laxative.  o Methylcellulose -- This is another fiber derived from wood which also retains water. It is available as Citrucel. o Polyethylene Glycol - and "artificial" fiber commonly called Miralax or Glycolax.  It is helpful for people with gassy or bloated feelings with regular fiber o Flax Seed - a less gassy fiber than psyllium   No reading or other relaxing activity while on the toilet. If bowel movements take longer than 5 minutes, you are too constipated   AVOID CONSTIPATION.  High fiber and water intake usually takes care of this.  Sometimes a laxative is needed to stimulate more frequent bowel movements, but    Laxatives are not a good long-term solution as it can wear the colon out. o Osmotics (Milk of Magnesia, Fleets phosphosoda, Magnesium  citrate, MiraLax, GoLytely) are safer than  o Stimulants (Senokot, Castor Oil, Dulcolax, Ex Lax)    o Do not take laxatives for more than 7days in a row.    IF SEVERELY CONSTIPATED, try a Bowel Retraining Program: o Do not use laxatives.  o Eat a diet high in roughage, such as bran cereals and leafy vegetables.  o Drink six (6) ounces of prune or apricot juice each morning.  o Eat two (2) large servings of stewed fruit each day.  o Take  one (1) heaping tablespoon of a psyllium-based bulking agent twice a day. Use sugar-free sweetener when possible to avoid excessive calories.  o Eat a normal breakfast.  o Set aside 15 minutes after breakfast to sit on the toilet, but do not strain to have a bowel movement.  o If you do not have a bowel movement by the third day, use an enema and repeat the above steps.    Controlling diarrhea o Switch to liquids and simpler foods for a few days to avoid stressing your intestines further. o Avoid dairy products (especially milk & ice cream) for a short time.  The intestines often can lose the ability to digest lactose when stressed. o Avoid foods that cause gassiness or bloating.  Typical foods include beans and other legumes, cabbage, broccoli, and dairy foods.  Every person has some sensitivity to other foods, so listen to our body and avoid those foods that trigger problems for you. o Adding fiber (Citrucel, Metamucil, psyllium, Miralax) gradually can help thicken stools by absorbing excess fluid and retrain the intestines to act more normally.  Slowly increase the dose over a few weeks.  Too much fiber too soon can backfire and cause cramping & bloating. o Probiotics (such as active yogurt, Align, etc) may help repopulate the intestines and colon with normal bacteria and calm down a sensitive digestive tract.  Most studies show it to be of mild help, though, and such products can be costly. o Medicines:   Bismuth subsalicylate (ex. Kayopectate, Pepto Bismol) every 30 minutes for up to 6 doses can help control diarrhea.  Avoid if pregnant.   Loperamide (Immodium) can slow down diarrhea.  Start with two tablets (4mg  total) first and then try one tablet every 6 hours.  Avoid if you are having fevers or severe pain.  If you are not better or start feeling worse, stop all medicines and call your doctor for advice o Call your doctor if you are getting worse or not better.  Sometimes further testing  (cultures, endoscopy, X-ray studies, bloodwork, etc) may be needed to help diagnose and treat the cause of the diarrhea.  Irritable Bowel Syndrome Irritable Bowel Syndrome (IBS) is caused by a disturbance of normal bowel function. Other terms used are spastic colon, mucous colitis, and irritable colon. It does not require surgery, nor does it lead to cancer. There is no cure for IBS. But with proper diet, stress reduction, and medication, you will find that your problems (symptoms) will gradually disappear or improve. IBS is a common digestive disorder. It usually appears in late adolescence or early adulthood. Women develop it twice as often as men. CAUSES  After food has been digested and absorbed in the small intestine, waste material is moved into the colon (large intestine). In the colon, water and salts are absorbed from the undigested products coming from the small intestine. The remaining residue, or fecal material, is held for elimination. Under normal circumstances, gentle, rhythmic contractions on  the bowel walls push the fecal material along the colon towards the rectum. In IBS, however, these contractions are irregular and poorly coordinated. The fecal material is either retained too long, resulting in constipation, or expelled too soon, producing diarrhea. SYMPTOMS  The most common symptom of IBS is pain. It is typically in the lower left side of the belly (abdomen). But it may occur anywhere in the abdomen. It can be felt as heartburn, backache, or even as a dull pain in the arms or shoulders. The pain comes from excessive bowel-muscle spasms and from the buildup of gas and fecal material in the colon. This pain:  Can range from sharp belly (abdominal) cramps to a dull, continuous ache.  Usually worsens soon after eating.  Is typically relieved by having a bowel movement or passing gas. Abdominal pain is usually accompanied by constipation. But it may also produce diarrhea. The diarrhea  typically occurs right after a meal or upon arising in the morning. The stools are typically soft and watery. They are often flecked with secretions (mucus). Other symptoms of IBS include:  Bloating.  Loss of appetite.  Heartburn.  Feeling sick to your stomach (nausea).  Belching  Vomiting  Gas. IBS may also cause a number of symptoms that are unrelated to the digestive system:  Fatigue.  Headaches.  Anxiety  Shortness of breath  Difficulty in concentrating.  Dizziness. These symptoms tend to come and go. DIAGNOSIS  The symptoms of IBS closely mimic the symptoms of other, more serious digestive disorders. So your caregiver may wish to perform a variety of additional tests to exclude these disorders. He/she wants to be certain of learning what is wrong (diagnosis). The nature and purpose of each test will be explained to you. TREATMENT A number of medications are available to help correct bowel function and/or relieve bowel spasms and abdominal pain. Among the drugs available are:  Mild, non-irritating laxatives for severe constipation and to help restore normal bowel habits.  Specific anti-diarrheal medications to treat severe or prolonged diarrhea.  Anti-spasmodic agents to relieve intestinal cramps.  Your caregiver may also decide to treat you with a mild tranquilizer or sedative during unusually stressful periods in your life. The important thing to remember is that if any drug is prescribed for you, make sure that you take it exactly as directed. Make sure that your caregiver knows how well it worked for you. HOME CARE INSTRUCTIONS   Avoid foods that are high in fat or oils. Some examples [id number] cream, butter, frankfurters, sausage, and other fatty meats.  Avoid foods that have a laxative effect, such as fruit, fruit juice, and dairy products.  Cut out carbonated drinks, chewing gum, and "gassy" foods, such as beans and cabbage. This may help relieve bloating  and belching.  Bran taken with plenty of liquids may help relieve constipation.  Keep track of what foods seem to trigger your symptoms.  Avoid emotionally charged situations or circumstances that produce anxiety.  Start or continue exercising.  Get plenty of rest and sleep. MAKE SURE YOU:   Understand these instructions.  Will watch your condition.  Will get help right away if you are not doing well or get worse. Document Released: 10/22/2005 Document Revised: 01/14/2012 Document Reviewed: 06/11/2008 [facility] Patient Information 2014 [place], [place].   Angina Pectoris Angina pectoris, often just called angina, is extreme discomfort in your chest, neck, or arm caused by a lack of blood in the middle and thickest layer of your heart wall (myocardium).  It may feel like tightness or heavy pressure. It may feel like a crushing or squeezing pain. Some people say it feels like gas or indigestion. It may go down your shoulders, back, and arms. Some people may have symptoms other than pain. These symptoms include fatigue, shortness of breath, cold sweats, or nausea. There are four different types of angina:  Stable angina Stable angina usually occurs in episodes of predictable frequency and duration. It usually is brought on by physical activity, emotional stress, or excitement. These are all times when the myocardium needs more oxygen. Stable angina usually lasts a few minutes and often is relieved by taking a medicine that can be taken under your tongue (sublingually). The medicine is called nitroglycerin. Stable angina is caused by a buildup of plaque inside the arteries, which restricts blood flow to the heart muscle (atherosclerosis).  Unstable angina Unstable angina can occur even when your body experiences little or no physical exertion. It can occur during sleep. It can also occur at rest. It can suddenly increase in severity or frequency. It might not be relieved by sublingual  nitroglycerin. It can last up to 30 minutes. The most common cause of unstable angina is a blood clot that has developed on the top of plaque buildup inside a coronary artery. It can lead to a heart attack if the blood clot completely blocks the artery.  Microvascular angina This type of angina is caused by a disorder of tiny blood vessels called arterioles. Microvascular angina is more common in women. The pain may be more severe and last longer than other types of angina pectoris.  Prinzmetal or variant angina This type of angina pectoris usually occurs when your body experiences little or no physical exertion. It especially occurs in the early morning hours. It is caused by a spasm of your coronary artery. HOME CARE INSTRUCTIONS   Only take over-the-counter and prescription medicines as directed by your caregiver.  Stay active or increase your exercise as directed by your caregiver.  Limit strenuous activity as directed by your caregiver.  Limit heavy lifting as directed by your caregiver.  Maintain a healthy weight.  Learn about and eat heart-healthy foods.  Do not smoke. SEEK IMMEDIATE MEDICAL CARE IF:  You experience the following symptoms:  Chest, neck, deep shoulder, or arm pain or discomfort that lasts more than a few minutes.  Chest, neck, deep shoulder, or arm pain or discomfort that goes away and comes back, repeatedly.  Heavy sweating with discomfort, without a noticeable cause.  Shortness of breath or difficulty breathing.  Angina that does not get better after a few minutes of rest or after taking sublingual nitroglycerin. These can all be symptoms of a heart attack, which is a medical emergency! Get medical help at once. Call your local emergency service (911 in [place] immediately. Do not  drive yourself to the hospital and do not  wait to for your symptoms to go away. MAKE SURE YOU:  Understand these instructions.  Will watch your condition.  Will get help right  away if you are not doing well or get worse. Document Released: 10/22/2005 Document Revised: 10/08/2012 Document Reviewed: 07/31/2012 [facility] Patient Information 2014 [place], [place].

## 2014-01-12 ENCOUNTER — Telehealth (INDEPENDENT_AMBULATORY_CARE_PROVIDER_SITE_OTHER): Payer: Self-pay | Admitting: *Deleted

## 2014-01-12 ENCOUNTER — Encounter (INDEPENDENT_AMBULATORY_CARE_PROVIDER_SITE_OTHER): Payer: Self-pay | Admitting: Surgery

## 2014-01-12 DIAGNOSIS — K589 Irritable bowel syndrome without diarrhea: Secondary | ICD-10-CM | POA: Insufficient documentation

## 2014-01-12 NOTE — Telephone Encounter (Signed)
I spoke with pt to inform her of the appt for her gastric emptying study scheduled at [facility] on 01/22/14 with an arrival time of 7:15am.  I instructed her to be NPO after midnight.  She is agreeable with appt information given.

## 2014-01-21 ENCOUNTER — Encounter (INDEPENDENT_AMBULATORY_CARE_PROVIDER_SITE_OTHER): Payer: Self-pay

## 2014-01-22 ENCOUNTER — Telehealth (INDEPENDENT_AMBULATORY_CARE_PROVIDER_SITE_OTHER): Payer: Self-pay | Admitting: *Deleted

## 2014-01-22 ENCOUNTER — Encounter (HOSPITAL_COMMUNITY)
Admission: RE | Admit: 2014-01-22 | Discharge: 2014-01-22 | Disposition: A | Discharge status: Home / Self Care | Payer: BC Managed Care – PPO | Source: Ambulatory Visit | Attending: Surgery | Admitting: Surgery

## 2014-01-22 DIAGNOSIS — R1013 Epigastric pain: Secondary | ICD-10-CM

## 2014-01-22 DIAGNOSIS — K3189 Other diseases of stomach and duodenum: Secondary | ICD-10-CM | POA: Insufficient documentation

## 2014-01-22 DIAGNOSIS — E119 Type 2 diabetes mellitus without complications: Secondary | ICD-10-CM | POA: Insufficient documentation

## 2014-01-22 DIAGNOSIS — I201 Angina pectoris with documented spasm: Secondary | ICD-10-CM

## 2014-01-22 DIAGNOSIS — K3184 Gastroparesis: Secondary | ICD-10-CM

## 2014-01-22 MED ORDER — TECHNETIUM TC 99M SULFUR COLLOID
2.0000 | Freq: Once | INTRAVENOUS | Status: AC | PRN
  Administered 2014-01-22: 2 via ORAL

## 2014-01-22 NOTE — Telephone Encounter (Signed)
Called pt to notify her that Dr Johney Maine did review pt's gastric emptying study which shows decreased gastric emptying consistent with gastroparesis. Dr Johney Maine advises pt to see GI for them to consider promotility medications. I asked the pt if she had a GI physician already which she does Dr Coral Spikes with [facility]. I advised pt that we would make an appt for her to get back into see them. I also advised pt that the nuclear medicine study should have no effect upon the kidneys or nerves per Dr Johney Maine. The pt understands.

## 2014-01-22 NOTE — Telephone Encounter (Signed)
Patient called in and stated that since her gastric emptying test this morning her symptoms have worsened and she is having an increase in abdominal cramping, burning and tingling in her throat.  He left leg is also tingling and she is having pain radiating down from her hip to her calf.  She normally has pain in her left leg but it radiates up instead of down.  She thinks the radioactive material used may be affecting her kidney causing her leg to hurt.  I informed her that these symptoms do not sound as if they are coming from the gastric emptying study.  I informed her that I would make Dr. Johney Maine aware and get his opinion on this.  She is agreeable at this time.

## 2014-01-22 NOTE — Addendum Note (Signed)
Addended by: Illene Regulus on: 01/22/2014 02:30 PM   Modules accepted: Orders

## 2014-01-22 NOTE — Telephone Encounter (Signed)
A gastric emptying study shows decreased gastric emptying.  Consistent with gastroparesis.  She would benefit from seeing a gastroenterologist to consider promotility medications.  This nuclear medicine study should have no effect upon kidneys or nerves.    She has a long history of having numerous complaints and atypical symptoms.  Some evidence of some somatoform disorder as well.  I definitely think that is a major contributor to some of her issues.  I would offer reassurance.  I have discussed with her concern discussing the psychiatry [facility] Psychology for support.  She was not receptive to that at this time.  I will defer to her primary care physician on that.

## 2014-01-28 ENCOUNTER — Encounter (INDEPENDENT_AMBULATORY_CARE_PROVIDER_SITE_OTHER): Payer: Self-pay

## 2014-01-29 ENCOUNTER — Encounter (INDEPENDENT_AMBULATORY_CARE_PROVIDER_SITE_OTHER): Payer: Self-pay

## 2014-02-02 ENCOUNTER — Encounter (INDEPENDENT_AMBULATORY_CARE_PROVIDER_SITE_OTHER): Payer: Self-pay

## 2014-02-16 ENCOUNTER — Encounter (INDEPENDENT_AMBULATORY_CARE_PROVIDER_SITE_OTHER): Payer: Self-pay

## 2014-02-23 NOTE — Telephone Encounter (Signed)
Appt was made with Dr Coral Spikes with [facility] for 02/12/14 @10 :30 and pt made aware of the appt. By Gaylyn Lambert.

## 2014-02-24 ENCOUNTER — Encounter (INDEPENDENT_AMBULATORY_CARE_PROVIDER_SITE_OTHER): Payer: Self-pay

## 2014-03-03 ENCOUNTER — Other Ambulatory Visit: Payer: Self-pay | Admitting: Unknown Physician Specialty

## 2014-03-03 DIAGNOSIS — R102 Pelvic and perineal pain: Secondary | ICD-10-CM

## 2014-03-03 DIAGNOSIS — R109 Unspecified abdominal pain: Secondary | ICD-10-CM

## 2014-03-04 ENCOUNTER — Other Ambulatory Visit: Payer: Self-pay | Admitting: Family Medicine

## 2014-03-04 DIAGNOSIS — R42 Dizziness and giddiness: Secondary | ICD-10-CM

## 2014-03-04 DIAGNOSIS — R29898 Other symptoms and signs involving the musculoskeletal system: Secondary | ICD-10-CM

## 2014-03-05 ENCOUNTER — Ambulatory Visit
Admission: RE | Admit: 2014-03-05 | Discharge: 2014-03-05 | Disposition: A | Discharge status: Home / Self Care | Payer: BC Managed Care – PPO | Source: Ambulatory Visit | Attending: Family Medicine | Admitting: Family Medicine

## 2014-03-05 DIAGNOSIS — R42 Dizziness and giddiness: Secondary | ICD-10-CM

## 2014-03-05 DIAGNOSIS — R29898 Other symptoms and signs involving the musculoskeletal system: Secondary | ICD-10-CM

## 2014-03-08 ENCOUNTER — Other Ambulatory Visit: Payer: BC Managed Care – PPO

## 2014-03-08 ENCOUNTER — Ambulatory Visit
Admission: RE | Admit: 2014-03-08 | Discharge: 2014-03-08 | Disposition: A | Discharge status: Home / Self Care | Payer: BC Managed Care – PPO | Source: Ambulatory Visit | Attending: Unknown Physician Specialty | Admitting: Unknown Physician Specialty

## 2014-03-08 DIAGNOSIS — R109 Unspecified abdominal pain: Secondary | ICD-10-CM

## 2014-03-08 DIAGNOSIS — R102 Pelvic and perineal pain: Secondary | ICD-10-CM

## 2014-03-08 MED ORDER — IOHEXOL 300 MG/ML  SOLN
100.0000 mL | Freq: Once | INTRAMUSCULAR | Status: AC | PRN
  Administered 2014-03-08: 100 mL via INTRAVENOUS

## 2014-08-25 ENCOUNTER — Encounter (INDEPENDENT_AMBULATORY_CARE_PROVIDER_SITE_OTHER): Payer: Self-pay

## 2014-08-25 ENCOUNTER — Encounter: Payer: Self-pay | Admitting: Neurology

## 2014-08-25 ENCOUNTER — Ambulatory Visit (INDEPENDENT_AMBULATORY_CARE_PROVIDER_SITE_OTHER): Payer: BC Managed Care – PPO | Admitting: Neurology

## 2014-08-25 VITALS — BP 110/66 | HR 74 | Temp 98.5°F | Resp 12 | Ht 61.5 in | Wt 135.0 lb

## 2014-08-25 DIAGNOSIS — G894 Chronic pain syndrome: Secondary | ICD-10-CM

## 2014-08-25 LAB — COMPREHENSIVE METABOLIC PANEL
ALBUMIN: 4.8 g/dL (ref 3.5–5.5)
ALT: 25 IU/L (ref 0–32)
AST: 28 IU/L (ref 0–40)
Albumin/Globulin Ratio: 1.7 (ref 1.1–2.5)
Alkaline Phosphatase: 77 IU/L (ref 39–117)
BUN/Creatinine Ratio: 18 (ref 9–23)
BUN: 13 mg/dL (ref 6–24)
CO2: 29 mmol/L (ref 18–29)
Calcium: 10.3 mg/dL — ABNORMAL HIGH (ref 8.7–10.2)
Chloride: 99 mmol/L (ref 96–108)
Creatinine, Ser: 0.71 mg/dL (ref 0.57–1.00)
GFR calc Af Amer: 118 mL/min/{1.73_m2} (ref >59)
GFR calc non Af Amer: 102 mL/min/{1.73_m2} (ref >59)
GLOBULIN, TOTAL: 2.8 g/dL (ref 1.5–4.5)
Glucose: 96 mg/dL (ref 65–99)
Potassium: 4.5 mmol/L (ref 3.5–5.2)
SODIUM: 137 mmol/L (ref 134–144)
Total Bilirubin: 0.5 mg/dL (ref 0.0–1.2)
Total Protein: 7.6 g/dL (ref 6.0–8.5)

## 2014-08-25 NOTE — Progress Notes (Signed)
SLEEP MEDICINE CLINIC   Provider:  Larey Seat, M D  Referring Provider: Reginia Naas, * Primary Care Physician:  Reginia Naas, MD  Chief Complaint  Patient presents with  . NP Left sided pain, numbness, tingling weakness    Rm 10, Alone    HPI:  Virginia Shaw is a 48 y.o. female , who is seen in my sleep clinic here as a referral  from Dr. Tamala Julian for an evaluation of  Severe, multifocal, multimodal Pain.  I have seen Mrs. Arista  in the past that have nor recent records available during today's visit.   Her chief complaint today of pain and numbness in the left side of her body and neurologic pain in the face and left scalp, vision changes left eye he came pain in the right hand and right-sided rib cage pain. She also has pelvic pain and abdominal pain especially with defecation. The ROS is too long to list.  Mr. Verret is an extensive surgical history and has seen multiple specialists including gastrointestinal workups and cardiology workups. 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 Cholecystectomy 2011,  lower back pain and surgeries in  2001, 2010, 2013, cataract surgery 2005 Dr. Katy Fitch,  Pituitary "cyst" removal 2009 ,Chiari malformation 2009 and neck surgery . She speaks with a low volume voice , she has "no energy", takes breaks in between small tasks.    She reports a pain and pressure behind the left eye involving the cheek bone and radiating behind the ear. Closing the Left eye helps the eye pain, she described a sharp  Pain as if stabbed through the eye.  The eye will become puffy. She does not experience this while sleeping. It does not wake her up.  She believes the pain is related to eye strain, after reading .    Review of Systems: Out of a complete 14 system review, the patient complains of only the following symptoms, and all other reviewed systems are negative.  Too  extensive to document, please a see EMR scanned document.    Epworth score 4 , Fatigue severity score 60  ,   I used a geriatric depression score in this young patient: 1 point.    History   Social History  . Marital Status: Married    Spouse Name: N/A    Number of Children: 2  . Years of Education: GED   Occupational History  .     Social History Main Topics  . Smoking status: Never Smoker   . Smokeless tobacco: Never Used  . Alcohol Use: No  . Drug Use: No  . Sexual Activity: No   Other Topics Concern  . Not on file   Social History Narrative   Lives at home with children. ( 2 daughters)   Regular exercise-yes   Caffeine Use-no   GED   Not Working outside home    Family History  Problem Relation Age of Onset  . Hypertension Father   . Hyperlipidemia Father   . Diabetes Father   . Stroke Father   . Heart attack Paternal Grandmother   . Heart attack Paternal Grandfather   . Hypertension Paternal Grandfather   . Cancer Maternal Uncle     lung  . Cancer Paternal Aunt     lung  . Migraines Mother   . Migraines Sister     Past Medical History  Diagnosis Date  . Abnormal involuntary movements(781.0)   . Allergic rhinitis, cause unspecified   . Anxiety state, unspecified   . Cervical spondylosis without myelopathy   . Disturbance of skin sensation   . Irritable bowel syndrome   . Lumbago   . Other and unspecified angina pectoris   . Other malaise and fatigue   . Disturbance of skin sensation   . Somatization disorder   . Spasm of muscle   . Systemic sclerosis   . Unspecified hereditary and idiopathic peripheral neuropathy   . Myalgia and myositis, unspecified   . History of chicken pox   . GERD (gastroesophageal reflux disease)   . Osteoporosis   . 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 Back surgery  2001, 2011,2013    Lumbar x 2  . Abdominal hysterectomy  1997  . Cholecystectomy    . Cervical  spine surgery  2013  . Oophorectomy  1999, 2000  . Bladder surgery  2011  . Pituitary excision  2010    pituitary cyst removed  . Craniectomy suboccipital w/ cervical laminectomy / chiari  2012    Dr Annette Stable, Chiari decompression with suboccipital craniectomy and  . Tubal ligation    . Cystocele repair  2002    Dr Gaetano Net  . Rectocele repair  2002    Dr. Gaetano Net  . Colonoscopy  2009    "Normal" Dr Sharlett Iles  . Colonoscopy  2002    "Normal" Dr Freddrick March  . Colonoscopy  ~2013    Dr Ferdinand Lango.  "Chest pain with the prep" = aborted  . Arnold chiari malformation  2013  . Tear duct tubes  2005    Current Outpatient Prescriptions  Medication Sig Dispense Refill  . amLODipine (NORVASC) 5 MG tablet Take 5 mg by mouth 2 (two) times daily.       Marland Kitchen aspirin 81 MG chewable tablet Chew 81 mg by mouth as needed.       . nitroGLYCERIN (NITROSTAT) 0.4 MG SL tablet Place 0.4 mg under the tongue every 5 (five) minutes as needed.      . promethazine (PHENERGAN) 12.5 MG tablet Take by mouth.       No current facility-administered medications for this visit.    Allergies as of 08/25/2014 - Review Complete 08/25/2014  Allergen Reaction Noted  . Acetaminophen  08/25/2014  . Benzonatate  08/25/2014  . Budesonide Other (See Comments) 08/25/2014  . Ceftin [cefuroxime axetil]  03/25/2012  . Cefuroxime axetil    . Cephalexin  03/25/2012  . Citric acid Hives and Swelling 08/25/2014  . Codeine    . Diazepam  08/25/2014  . Diltiazem  03/25/2012  . Esomeprazole  08/25/2014  . Hydrocodone  08/25/2014  . Hydrocodone-acetaminophen  08/25/2014  . Ibuprofen    . Macrobid [nitrofurantoin monohyd macro] Other (See Comments) 09/22/2012  . Maxalt [rizatriptan]  03/25/2012  . Morphine and related Other (See Comments) 08/25/2014  . Naproxen  08/25/2014  . Nsaids  03/25/2012  . Omeprazole  03/25/2012  . Oxycodone  08/25/2014  . Oxycodone-acetaminophen  08/25/2014  . Penicillins    . Phenazopyridine hcl   03/25/2012  . Prednisone Other (See Comments) 08/25/2014  . Protonix [pantoprazole sodium]  03/25/2012    Vitals: BP 110/66  Pulse 74  Temp(Src) 98.5 F (36.9 C) (Oral)  Resp 12  Ht 5' 1.5" (1.562 m)  Wt 135 lb (61.236 kg)  BMI 25.10 kg/m2 Last Weight:  Wt Readings from  Last 1 Encounters:  08/25/14 135 lb (61.236 kg)       Last Height:   Ht Readings from Last 1 Encounters:  08/25/14 5' 1.5" (1.562 m)    Physical exam:  General: The patient is awake, alert and appears not in acute distress, but depressed and meek. The patient is well groomed. Head: Normocephalic, atraumatic. Neck is supple. Mallampati 1   neck circumference: 14 . Nasal airflow unrestricted , TMJ is not evident . Retrognathia is not seen.  Cardiovascular:  Regular rate and rhythm , without  murmurs or carotid bruit, and without distended neck veins. Respiratory: Lungs are clear to auscultation. Skin:  Without evidence of edema, or rash Trunk: BMI is normal , as is posture.  Neurologic exam : The patient is awake and alert, oriented to place and time.   Memory subjective described as intact. There is a subjectively reduced loss of focus, due to fatigue , this may restrict attention span & concentration ability.  Speech is fluent without dysarthria, dysphonia or aphasia. Very low volume, regressive.  Mood and affect : Meek.   Cranial nerves: Pupils are equal and briskly reactive to light. Funduscopic exam without  evidence of pallor or edema. Dry eye . Bilateral ptosis.   Extraocular movements  in vertical and horizontal planes intact and without nystagmus. Patient complains of pain. Visual fields by finger perimetry are intact. Hearing to finger rub intact.  Facial sensation intact to fine touch. Facial motor strength is asymmetric , the left mouth is lower,  tongue and uvula move midline.  Motor exam:  Normal tone ,muscle bulk and symmetric, strength in all extremities. She has subjective stiffness and joint  pain restricting her range of motion.  Her left patella is attenuated, AND SHE HAS A SLIGHTY WEAKER FOOT LIFT.   Sensory:  Fine touch, pinprick and vibration were tested in all extremities.  Proprioception is tested in the upper extremities only. This was  normal.  Coordination: Rapid alternating movements in the fingers/hands is normal.  Finger-to-nose maneuver  normal without evidence of ataxia, dysmetria or tremor.  Gait and station: Patient walks without assistive device and is able unassisted to climb up to the exam table.  Strength within normal limits. Stance is stable and normal.  Tandem gait is unfragmented. Romberg testing is negative.  Deep tendon reflexes: in the  upper and lower extremities are symmetric and intact. Babinski maneuver response is   downgoing.   Assessment:  After physical and neurologic examination, review of laboratory studies, imaging, neurophysiology testing and pre-existing records, assessment is :  Multifocal pains, some sharp some throbbing, left leg radiculopathy followed by Dr Trenton Gammon-  I try to address the non orthopedic and non spinal issues.  Her facial pain, sharp and stabbing is reminiscent of a cluster headache.  Not SUNCT,  Nose may run but she has severe dry eye.  No hemiv crania continua.  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   MRI brain will be ordered, and cervical spine. Former arnold chiari patient. Check for myelomalacia, demyelination,  Dear Dr Tamala Julian : I have seen Mrs Magallanes many years ago and at the time explained that I found nothing treatment wise to offer.  I hope we can rule out a demyelination, myelopathy . If that is done, I will not be able to address this multifocal pain disorder further. A pain management [facility]  [place], referred there by Dr Gertie Fey )  and psychological support should be established.    Rheumatology referral suggested, but patient has a specialist in [facility].  I will defer her care if MRI is negative. Follow up with my NP in 4-6  weeks.      The patient was advised of the work up,  For a non specified  condition.  This patient has seen multiple specialist, many only once or twice and has to establish a medical home. I have concerns about a non organic nature of the symptoms.  Visit duration was 45 minutes.        Asencion Partridge Ahriyah Vannest MD  08/25/2014

## 2014-08-25 NOTE — Patient Instructions (Signed)
Pain of Unknown Etiology (Pain Without a Known Cause) °You have come to your caregiver because of pain. Pain can occur in any part of the body. Often there is not a definite cause. If your laboratory (blood or urine) work was normal and X-rays or other studies were normal, your caregiver may treat you without knowing the cause of the pain. An example of this is the headache. Most headaches are diagnosed by taking a history. This means your caregiver asks you questions about your headaches. Your caregiver determines a treatment based on your answers. Usually testing done for headaches is normal. Often testing is not done unless there is no response to medications. Regardless of where your pain is located today, you can be given medications to make you comfortable. If no physical cause of pain can be found, most cases of pain will gradually leave as suddenly as they came.  °If you have a painful condition and no reason can be found for the pain, it is important that you follow up with your caregiver. If the pain becomes worse or does not go away, it may be necessary to repeat tests and look further for a possible cause. °· Only take over-the-counter or prescription medicines for pain, discomfort, or fever as directed by your caregiver. °· For the protection of your privacy, test results cannot be given over the phone. Make sure you receive the results of your test. Ask how these results are to be obtained if you have not been informed. It is your responsibility to obtain your test results. °· You may continue all activities unless the activities cause more pain. When the pain lessens, it is important to gradually resume normal activities. Resume activities by beginning slowly and gradually increasing the intensity and duration of the activities or exercise. During periods of severe pain, bed rest may be helpful. Lie or sit in any position that is comfortable. °· Ice used for acute (sudden) conditions may be effective.  Use a large plastic bag filled with ice and wrapped in a towel. This may provide pain relief. °· See your caregiver for continued problems. Your caregiver can help or refer you for exercises or physical therapy if necessary. °If you were given medications for your condition, do not drive, operate machinery or power tools, or sign legal documents for 24 hours. Do not drink alcohol, take sleeping pills, or take other medications that may interfere with treatment. °See your caregiver immediately if you have pain that is becoming worse and not relieved by medications. °Document Released: 07/17/2001 Document Revised: 08/12/2013 Document Reviewed: 10/22/2005 °ExitCare® Patient Information ©2015 [employer]. This information is not intended to replace advice given to you by your health care provider. Make sure you discuss any questions you have with your health care provider. ° °

## 2014-09-05 ENCOUNTER — Ambulatory Visit
Admission: RE | Admit: 2014-09-05 | Discharge: 2014-09-05 | Disposition: A | Discharge status: Home / Self Care | Payer: BC Managed Care – PPO | Source: Ambulatory Visit | Attending: Neurology | Admitting: Neurology

## 2014-09-05 DIAGNOSIS — G894 Chronic pain syndrome: Secondary | ICD-10-CM

## 2014-09-05 MED ORDER — GADOBENATE DIMEGLUMINE 529 MG/ML IV SOLN
12.0000 mL | Freq: Once | INTRAVENOUS | Status: AC | PRN
  Administered 2014-09-05: 12 mL via INTRAVENOUS

## 2014-09-09 ENCOUNTER — Telehealth: Payer: Self-pay | Admitting: Neurology

## 2014-09-09 NOTE — Telephone Encounter (Signed)
LMVM for pt that I returned call re: MIR results and lower back pain.

## 2014-09-09 NOTE — Telephone Encounter (Signed)
Patient is calling for MRI results and also to let us know that her lower back is hurting really bad. Please call.

## 2014-09-10 NOTE — Progress Notes (Signed)
Quick Note:  I spoke to pt and relayed the results of C spine and Brain. (stable), no changes. She is to follow up with Dr. [name] early next week to see where to go from there. Pt verbalized understanding. ______

## 2014-09-10 NOTE — Telephone Encounter (Signed)
Patient returning call to [facility], please return call and advise. She can be reached at [phone].

## 2014-09-10 NOTE — Progress Notes (Signed)
Quick Note:  Spoke to pt and relayed results as per other result note on Brain MRI. ______

## 2014-09-10 NOTE — Telephone Encounter (Signed)
I called pt and relayed the results of MRI c spine and brain.  She asked a lot of questions about her lower back, thoracic spine and then chronic sinusitis.   I told her to touch base with her pcp. I will forward to Dr/ Dohmeier.  I will fax note and MRI results to Dr. Tamala Julian (pcp).  I relayed that it is noted that CT myelogram and L spine per Dr. Annette Stable ordered in [facility].  Pt will f/u on this.

## 2014-09-23 ENCOUNTER — Ambulatory Visit: Payer: BC Managed Care – PPO | Admitting: Adult Health

## 2014-12-06 ENCOUNTER — Encounter: Payer: Self-pay | Admitting: Neurology

## 2015-08-24 ENCOUNTER — Encounter: Payer: Self-pay | Admitting: *Deleted

## 2015-08-24 ENCOUNTER — Emergency Department
Admission: EM | Admit: 2015-08-24 | Discharge: 2015-08-24 | Disposition: A | Discharge status: Home / Self Care | Payer: Self-pay | Attending: Emergency Medicine | Admitting: Emergency Medicine

## 2015-08-24 ENCOUNTER — Emergency Department: Payer: Self-pay

## 2015-08-24 DIAGNOSIS — R1310 Dysphagia, unspecified: Secondary | ICD-10-CM | POA: Insufficient documentation

## 2015-08-24 DIAGNOSIS — Z880 Allergy status to penicillin: Secondary | ICD-10-CM | POA: Insufficient documentation

## 2015-08-24 DIAGNOSIS — G8929 Other chronic pain: Secondary | ICD-10-CM | POA: Insufficient documentation

## 2015-08-24 DIAGNOSIS — R0789 Other chest pain: Secondary | ICD-10-CM | POA: Insufficient documentation

## 2015-08-24 DIAGNOSIS — R51 Headache: Secondary | ICD-10-CM | POA: Insufficient documentation

## 2015-08-24 DIAGNOSIS — R05 Cough: Secondary | ICD-10-CM | POA: Insufficient documentation

## 2015-08-24 DIAGNOSIS — Z79899 Other long term (current) drug therapy: Secondary | ICD-10-CM | POA: Insufficient documentation

## 2015-08-24 DIAGNOSIS — R058 Other specified cough: Secondary | ICD-10-CM

## 2015-08-24 DIAGNOSIS — R0602 Shortness of breath: Secondary | ICD-10-CM | POA: Insufficient documentation

## 2015-08-24 DIAGNOSIS — R5381 Other malaise: Secondary | ICD-10-CM | POA: Insufficient documentation

## 2015-08-24 LAB — BASIC METABOLIC PANEL
Anion gap: 10 (ref 5–15)
BUN: 8 mg/dL (ref 6–20)
CALCIUM: 9.3 mg/dL (ref 8.9–10.3)
CO2: 28 mmol/L (ref 22–32)
CREATININE: 0.77 mg/dL (ref 0.44–1.00)
Chloride: 102 mmol/L (ref 101–111)
GFR calc Af Amer: >60 mL/min (ref >60)
GFR calc non Af Amer: >60 mL/min (ref >60)
Glucose, Bld: 104 mg/dL — ABNORMAL HIGH (ref 65–99)
Potassium: 4.2 mmol/L (ref 3.5–5.1)
SODIUM: 140 mmol/L (ref 135–145)

## 2015-08-24 LAB — CBC
HEMATOCRIT: 40.6 % (ref 35.0–47.0)
Hemoglobin: 13.6 g/dL (ref 12.0–16.0)
MCH: 29.8 pg (ref 26.0–34.0)
MCHC: 33.5 g/dL (ref 32.0–36.0)
MCV: 88.9 fL (ref 80.0–100.0)
Platelets: 379 10*3/uL (ref 150–440)
RBC: 4.57 MIL/uL (ref 3.80–5.20)
RDW: 12.8 % (ref 11.5–14.5)
WBC: 11.3 10*3/uL — ABNORMAL HIGH (ref 3.6–11.0)

## 2015-08-24 LAB — TROPONIN I: Troponin I: <0.03 ng/mL (ref <0.031)

## 2015-08-24 NOTE — ED Provider Notes (Signed)
[facility] Emergency Department Provider Note  ____________________________________________  Time seen: Approximately 5:57 PM  I have reviewed the triage vital signs and the nursing notes.   HISTORY  Chief Complaint Chest Pain    HPI Virginia Shaw is a 49 y.o. female with a complicated history of multiple chronic conditions including "abnormal involuntary movements", irritable bowel syndrome, somatization disorder, Prinzmetal's angina, unspecified idiopathic peripheral neuropathy, etc. (see below) who presents with multiple days (nearly a week) of general malaise, mild to moderate intermittent chest pain and tightness similar to what she has had in the past, and difficulty swallowing.  She has multiple doctors at [facility] which she has seen regarding this problem and has had recent imaging including a recent MRI but no specific diagnosis has been provided.  She went to the [facility] emergency department several days ago and was discharged without any specific intervention.  Today she said her blood pressure got "all the way up to 130 and she was told over the phone line by the nurse in clinic that she should go reevaluated.  She went to the urgent care which then sent her over to the emergency department after telling them she was having chest pain.  The symptoms appear chronic and stablebased on her own description and she is currently under the care of doctors in the [facility] system.  Nothing makes the symptoms worse and nothing makes them better.  Been gradual in onset and persistent.  She denies nausea/vomiting, abdominal pain, dysuria.  She has not intermittent dry cough and intermittent headache.   Past Medical History  Diagnosis Date  . Abnormal involuntary movements(781.0)   . Allergic rhinitis, cause unspecified   . Anxiety state, unspecified   . Cervical spondylosis without myelopathy   . Disturbance of skin sensation   . Irritable bowel syndrome   . Lumbago    . Other and unspecified angina pectoris   . Other malaise and fatigue   . Disturbance of skin sensation   . Somatization disorder   . Spasm of muscle   . Systemic sclerosis ([place])   . Unspecified hereditary and idiopathic peripheral neuropathy   . Myalgia and myositis, unspecified   . History of chicken pox   . GERD (gastroesophageal reflux disease)   . Osteoporosis   . 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[place]) 01/11/2014    Patient Active Problem List   Diagnosis Date Noted  . Chronic pain syndrome 08/25/2014  . Irritable bowel syndrome (IBS) 01/12/2014  . 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[place]) 01/11/2014  . Chronic abdominal pain 01/11/2014  . Chronic venous insufficiency 10/30/2013  . Varicose veins 10/30/2013  . Chest tightness 03/25/2012  . 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 RECTAL BLEEDING 08/26/2008  . UTI'S, RECURRENT 08/26/2008    Past Surgical History  Procedure Laterality Date  . Back surgery  2001, 2011,2013    Lumbar x 2  . Abdominal hysterectomy  1997  . Cholecystectomy    . Cervical spine surgery  2013  . Oophorectomy  1999, 2000  . Bladder surgery  2011  . Pituitary excision  2010    pituitary cyst removed  . Craniectomy suboccipital w/ cervical laminectomy / chiari  2012    Dr Annette Stable, Chiari decompression with suboccipital craniectomy and  . Tubal ligation    . Cystocele repair  2002    Dr Gaetano Net  . Rectocele repair  2002    Dr. Gaetano Net  . Colonoscopy  2009    "Normal" Dr Sharlett Iles  . Colonoscopy  2002    "  Normal" Dr Freddrick March  . Colonoscopy  ~2013    Dr Ferdinand Lango.  "Chest pain with the prep" = aborted  . Arnold chiari malformation  2013  . Tear duct tubes  2005    Current Outpatient Rx  Name  Route  Sig  Dispense  Refill  . amLODipine (NORVASC) 5 MG tablet   Oral   Take 5 mg by mouth 2 (two) times daily.          Marland Kitchen aspirin 81 MG chewable tablet   Oral   Chew 81 mg by mouth as needed.          . nitroGLYCERIN (NITROSTAT) 0.4 MG SL tablet   Sublingual    Place 0.4 mg under the tongue every 5 (five) minutes as needed.         . promethazine (PHENERGAN) 12.5 MG tablet   Oral   Take by mouth.           Allergies Acetaminophen; Benzonatate; Budesonide; Ceftin; Cefuroxime axetil; Cephalexin; Citric acid; Codeine; Diazepam; Diltiazem; Esomeprazole; Hydrocodone; Hydrocodone-acetaminophen; Ibuprofen; Macrobid; Maxalt; Morphine and related; Naproxen; Nsaids; Omeprazole; Oxycodone; Oxycodone-acetaminophen; Penicillins; Phenazopyridine hcl; Prednisone; and Protonix  Family History  Problem Relation Age of Onset  . Hypertension Father   . Hyperlipidemia Father   . Diabetes Father   . Stroke Father   . Heart attack Paternal Grandmother   . Heart attack Paternal Grandfather   . Hypertension Paternal Grandfather   . Cancer Maternal Uncle     lung  . Cancer Paternal Aunt     lung  . Migraines Mother   . Migraines Sister     Social History Social History  Substance Use Topics  . Smoking status: Never Smoker   . Smokeless tobacco: Never Used  . Alcohol Use: No    Review of Systems Constitutional: No fever/chills.  General malaise. Eyes: No visual changes. ENT: No sore throat. Cardiovascular: Chronic central chest pain and tightness Respiratory: Occasional shortness of breath and dry cough Gastrointestinal: No abdominal pain.  No nausea, no vomiting.  No diarrhea.  No constipation. Genitourinary: Negative for dysuria. Musculoskeletal: Negative for back pain. Skin: Negative for rash. Neurological: Intermittent headache.  10-point ROS otherwise negative.  ____________________________________________   PHYSICAL EXAM:  VITAL SIGNS: ED Triage Vitals  Enc Vitals Group     BP 08/24/15 1553 105/75 mmHg     Pulse Rate 08/24/15 1553 97     Resp 08/24/15 1553 16     Temp 08/24/15 1553 98.3 F (36.8 C)     Temp Source 08/24/15 1553 Oral     SpO2 08/24/15 1553 99 %     Weight 08/24/15 1553 130 lb (58.968 kg)     Height 08/24/15  1553 5\' 1"  (1.549 m)     Head Cir --      Peak Flow --      Pain Score 08/24/15 1548 7     Pain Loc --      Pain Edu? --      Excl. in [place]? --     Constitutional: Alert and oriented. Well appearing and in no acute distress. Eyes: Conjunctivae are normal. PERRL. EOMI. Head: Atraumatic. Nose: No congestion/rhinnorhea.  Voice is hoarse Mouth/Throat: Mucous membranes are moist.  Oropharynx non-erythematous. Neck: No stridor.   Cardiovascular: Normal rate, regular rhythm. Grossly normal heart sounds.  Good peripheral circulation. Respiratory: Normal respiratory effort.  No retractions. Lungs CTAB. Gastrointestinal: Soft and nontender. No distention. No abdominal bruits. No CVA tenderness. Musculoskeletal: No lower  extremity tenderness nor edema.  No joint effusions. Neurologic:  Normal speech and language. No gross focal neurologic deficits are appreciated.  Skin:  Skin is warm, dry and intact. No rash noted. Psychiatric: Mood and affect are depressed but appropriate  ____________________________________________   LABS (all labs ordered are listed, but only abnormal results are displayed)  Labs Reviewed  BASIC METABOLIC PANEL - Abnormal; Notable for the following:    Glucose, Bld 104 (*)    All other components within normal limits  CBC - Abnormal; Notable for the following:    WBC 11.3 (*)    All other components within normal limits  TROPONIN I   ____________________________________________  EKG  ED ECG REPORT I, Woodrow Dulski, the attending physician, personally viewed and interpreted this ECG.  Date: 08/24/2015 EKG Time: 15:49 Rate: 93 Rhythm: normal sinus rhythm QRS Axis: normal Intervals: normal ST/T Wave abnormalities: normal Conduction Disutrbances: none Narrative Interpretation: unremarkable  ____________________________________________  RADIOLOGY   Dg Chest 2 View  08/24/2015  CLINICAL DATA:  Chest pain. EXAM: CHEST  2 VIEW COMPARISON:  None. FINDINGS:  The heart size and mediastinal contours are within normal limits. Both lungs are clear. No pneumothorax or pleural effusion is noted. The visualized skeletal structures are unremarkable. IMPRESSION: No active cardiopulmonary disease. Electronically Signed   By: Marijo Conception, M.D.   On: 08/24/2015 16:36    ____________________________________________   PROCEDURES  Procedure(s) performed: None  Critical Care performed: No ____________________________________________   INITIAL IMPRESSION / ASSESSMENT AND PLAN / ED COURSE  Pertinent labs & imaging results that were available during my care of the patient were reviewed by me and considered in my medical decision making (see chart for details).  Vital signs are stable, patient is afebrile and normotensive.  Workup is unremarkable.  She has been seen multiple times by multiple providers recently and is under the care of both a primary care doctor and a specialist for her issues.  I believe that her medical screening exam is reassuring and she has no emergent medical condition at this time.  I had an extensive discussion with her and her daughter about this and encouraged her to follow up as an outpatient.  She understands and agrees with the plan.  I gave her my usual and customary return precautions.  ____________________________________________  FINAL CLINICAL IMPRESSION(S) / ED DIAGNOSES  Final diagnoses:  Atypical chest pain  Dry cough      NEW MEDICATIONS STARTED DURING THIS VISIT:  New Prescriptions   No medications on file     Hinda Kehr, MD 08/24/15 1816

## 2015-08-24 NOTE — Discharge Instructions (Signed)
As we discussed, your workup today was reassuring.  Though we do not know exactly what is causing your symptoms, it appears that you have no emergent medical condition at this time are safe to go home and follow up as recommended in this paperwork.  Please return immediately to the Emergency Department if you develop any new or worsening symptoms that concern you.   Nonspecific Chest Pain  Chest pain can be caused by many different conditions. There is always a chance that your pain could be related to something serious, such as a heart attack or a blood clot in your lungs. Chest pain can also be caused by conditions that are not life-threatening. If you have chest pain, it is very important to follow up with your health care provider. CAUSES  Chest pain can be caused by:  Heartburn.  Pneumonia or bronchitis.  Anxiety or stress.  Inflammation around your heart (pericarditis) or lung (pleuritis or pleurisy).  A blood clot in your lung.  A collapsed lung (pneumothorax). It can develop suddenly on its own (spontaneous pneumothorax) or from trauma to the chest.  Shingles infection (varicella-zoster virus).  Heart attack.  Damage to the bones, muscles, and cartilage that make up your chest wall. This can include:  Bruised bones due to injury.  Strained muscles or cartilage due to frequent or repeated coughing or overwork.  Fracture to one or more ribs.  Sore cartilage due to inflammation (costochondritis). RISK FACTORS  Risk factors for chest pain may include:  Activities that increase your risk for trauma or injury to your chest.  Respiratory infections or conditions that cause frequent coughing.  Medical conditions or overeating that can cause heartburn.  Heart disease or family history of heart disease.  Conditions or health behaviors that increase your risk of developing a blood clot.  Having had chicken pox (varicella zoster). SIGNS AND SYMPTOMS Chest pain can feel  like:  Burning or tingling on the surface of your chest or deep in your chest.  Crushing, pressure, aching, or squeezing pain.  Dull or sharp pain that is worse when you move, cough, or take a deep breath.  Pain that is also felt in your back, neck, shoulder, or arm, or pain that spreads to any of these areas. Your chest pain may come and go, or it may stay constant. DIAGNOSIS Lab tests or other studies may be needed to find the cause of your pain. Your health care provider may have you take a test called an ambulatory ECG (electrocardiogram). An ECG records your heartbeat patterns at the time the test is performed. You may also have other tests, such as:  Transthoracic echocardiogram (TTE). During echocardiography, sound waves are used to create a picture of all of the heart structures and to look at how blood flows through your heart.  Transesophageal echocardiogram (TEE).This is a more advanced imaging test that obtains images from inside your body. It allows your health care provider to see your heart in finer detail.  Cardiac monitoring. This allows your health care provider to monitor your heart rate and rhythm in real time.  Holter monitor. This is a portable device that records your heartbeat and can help to diagnose abnormal heartbeats. It allows your health care provider to track your heart activity for several days, if needed.  Stress tests. These can be done through exercise or by taking medicine that makes your heart beat more quickly.  Blood tests.  Imaging tests. TREATMENT  Your treatment depends on what  is causing your chest pain. Treatment may include:  Medicines. These may include:  Acid blockers for heartburn.  Anti-inflammatory medicine.  Pain medicine for inflammatory conditions.  Antibiotic medicine, if an infection is present.  Medicines to dissolve blood clots.  Medicines to treat coronary artery disease.  Supportive care for conditions that do not  require medicines. This may include:  Resting.  Applying heat or cold packs to injured areas.  Limiting activities until pain decreases. HOME CARE INSTRUCTIONS  If you were prescribed an antibiotic medicine, finish it all even if you start to feel better.  Avoid any activities that bring on chest pain.  Do not use any tobacco products, including cigarettes, chewing tobacco, or electronic cigarettes. If you need help quitting, ask your health care provider.  Do not drink alcohol.  Take medicines only as directed by your health care provider.  Keep all follow-up visits as directed by your health care provider. This is important. This includes any further testing if your chest pain does not go away.  If heartburn is the cause for your chest pain, you may be told to keep your head raised (elevated) while sleeping. This reduces the chance that acid will go from your stomach into your esophagus.  Make lifestyle changes as directed by your health care provider. These may include:  Getting regular exercise. Ask your health care provider to suggest some activities that are safe for you.  Eating a heart-healthy diet. A registered dietitian can help you to learn healthy eating options.  Maintaining a healthy weight.  Managing diabetes, if necessary.  Reducing stress. SEEK MEDICAL CARE IF:  Your chest pain does not go away after treatment.  You have a rash with blisters on your chest.  You have a fever. SEEK IMMEDIATE MEDICAL CARE IF:   Your chest pain is worse.  You have an increasing cough, or you cough up blood.  You have severe abdominal pain.  You have severe weakness.  You faint.  You have chills.  You have sudden, unexplained chest discomfort.  You have sudden, unexplained discomfort in your arms, back, neck, or jaw.  You have shortness of breath at any time.  You suddenly start to sweat, or your skin gets clammy.  You feel nauseous or you vomit.  You  suddenly feel light-headed or dizzy.  Your heart begins to beat quickly, or it feels like it is skipping beats. These symptoms may represent a serious problem that is an emergency. Do not wait to see if the symptoms will go away. Get medical help right away. Call your local emergency services (911 in the [place] Do not drive yourself to the hospital.   This information is not intended to replace advice given to you by your health care provider. Make sure you discuss any questions you have with your health care provider.   Document Released: 08/01/2005 Document Revised: 11/12/2014 Document Reviewed: 05/28/2014 Elsevier Interactive Patient Education [employer].

## 2015-08-24 NOTE — ED Notes (Signed)
Pt reports chest pain since last night. BP elevated last night. Nausea, lightheaded, headache. Sent from [facility].

## 2016-02-09 ENCOUNTER — Other Ambulatory Visit: Payer: Self-pay | Admitting: Neurosurgery

## 2016-02-09 DIAGNOSIS — M47812 Spondylosis without myelopathy or radiculopathy, cervical region: Secondary | ICD-10-CM

## 2016-02-09 DIAGNOSIS — H547 Unspecified visual loss: Secondary | ICD-10-CM

## 2016-02-16 ENCOUNTER — Other Ambulatory Visit: Payer: Self-pay

## 2016-09-20 ENCOUNTER — Other Ambulatory Visit: Payer: Self-pay | Admitting: Neurosurgery

## 2016-09-20 DIAGNOSIS — M47812 Spondylosis without myelopathy or radiculopathy, cervical region: Secondary | ICD-10-CM

## 2016-09-20 DIAGNOSIS — H547 Unspecified visual loss: Secondary | ICD-10-CM

## 2016-10-02 ENCOUNTER — Ambulatory Visit
Admission: RE | Admit: 2016-10-02 | Discharge: 2016-10-02 | Disposition: A | Discharge status: Home / Self Care | Payer: No Typology Code available for payment source | Source: Ambulatory Visit | Attending: Neurosurgery | Admitting: Neurosurgery

## 2016-10-02 DIAGNOSIS — M47812 Spondylosis without myelopathy or radiculopathy, cervical region: Secondary | ICD-10-CM

## 2016-10-02 DIAGNOSIS — H547 Unspecified visual loss: Secondary | ICD-10-CM

## 2016-11-30 ENCOUNTER — Other Ambulatory Visit: Payer: Self-pay | Admitting: Family Medicine

## 2016-11-30 ENCOUNTER — Ambulatory Visit
Admission: RE | Admit: 2016-11-30 | Discharge: 2016-11-30 | Disposition: A | Discharge status: Home / Self Care | Payer: Medicaid Other | Source: Ambulatory Visit | Attending: Family Medicine | Admitting: Family Medicine

## 2016-11-30 DIAGNOSIS — R1032 Left lower quadrant pain: Secondary | ICD-10-CM

## 2016-12-06 ENCOUNTER — Other Ambulatory Visit: Payer: Self-pay | Admitting: Family Medicine

## 2016-12-06 DIAGNOSIS — R1032 Left lower quadrant pain: Secondary | ICD-10-CM

## 2016-12-13 ENCOUNTER — Ambulatory Visit
Admission: RE | Admit: 2016-12-13 | Discharge: 2016-12-13 | Disposition: A | Discharge status: Home / Self Care | Payer: Medicaid Other | Source: Ambulatory Visit | Attending: Family Medicine | Admitting: Family Medicine

## 2016-12-13 DIAGNOSIS — R1032 Left lower quadrant pain: Secondary | ICD-10-CM

## 2016-12-14 ENCOUNTER — Inpatient Hospital Stay
Admission: RE | Admit: 2016-12-14 | Discharge: 2016-12-14 | Disposition: A | Discharge status: Home / Self Care | Payer: Medicaid Other | Source: Ambulatory Visit | Attending: Family Medicine | Admitting: Family Medicine

## 2017-03-14 ENCOUNTER — Ambulatory Visit (INDEPENDENT_AMBULATORY_CARE_PROVIDER_SITE_OTHER): Payer: Medicaid Other | Admitting: Neurology

## 2017-03-14 ENCOUNTER — Encounter: Payer: Self-pay | Admitting: Neurology

## 2017-03-14 ENCOUNTER — Encounter (INDEPENDENT_AMBULATORY_CARE_PROVIDER_SITE_OTHER): Payer: Self-pay

## 2017-03-14 VITALS — BP 104/69 | HR 86 | Ht 64.0 in | Wt 145.0 lb

## 2017-03-14 DIAGNOSIS — R6889 Other general symptoms and signs: Secondary | ICD-10-CM | POA: Insufficient documentation

## 2017-03-14 MED ORDER — CYCLOBENZAPRINE HCL 5 MG PO TABS: 5.0000 mg | ORAL_TABLET | Freq: Every day | ORAL | 0 refills | Status: DC

## 2017-03-14 NOTE — Progress Notes (Signed)
SLEEP MEDICINE CLINIC   Provider:  Larey Seat, M D  Referring Provider: [name], MD Primary Care Physician:  [name], MD    HPI:  Virginia Shaw is a 51 y.o. female , who is seen for the third time by me, and for the second time  in my sleep clinic as a referral  from Virginia. Tamala Shaw for an evaluation of  Severe, multifocal, multimodal Pain, this time declared to be neuralgia. I see established patients but new patient referrals are SLEEP MEDICINE related.   I have seen Virginia Shaw  in the past that have  recent records available during today's visit. These are Virginia. Tamala Shaw notes's summarizing many other specialists work up and recommendations.   Virginia Shaw medical history is unusually complicated. 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 She finally was prepped for a myelogram August 2016 she "felt the shot in the back of her left eye and head and neck"- ([place]. Levora Angel, [facility] pain management ) - the study was supposingly finished and she "never heard any results of it "; she now reports to have had almost 2 years of discomfort when sitting.  Cardiology work up - she insists she was diagnosed with CAD - but none of the tests documented this diagnosis. She is on mononitrate po for angina.  She saw orthopedist on 12/26/2016 where she reported the symptoms have been close to 10 years in duration, she had seen neurosurgeon Virginia. Annette Stable is status post multilevel cervical fusion, and "Virginia Shaw -Chiari malformation" , now has lower back and pelvic girdle pain particularly in her anterior hip and groin she opted not to proceed with further imaging at the time - but  Apparently finally decided to follow-up with MRI at [facility] orthopedic specialists. [place]. Oneita Kras imaged the hip  and lower back. The recommendations were to perform pelvic floor exercises and  core training with pilates or yoga help strengthening the core muscles.  [facility] provided Virginia Shaw care after she lost insurance, evaluated her by cystoscopy after which the patient developed severe pain and chest pain and "could not continue the evaluation". She ended up in the hospital per EMS that night. She will now see [place] [name] at [facility].  She saw her gynecologist after that ( [place] Gertie Fey, [facility] ) ,  who supposingly told her " that the kidney stone had blocked the urethra". She develop left leg and groin pain. 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 Virginia Shaw does not longer travel on vacations with her family because of the pain in legs and chest, she wakes her daughter when she is in pain, and let's her drive her to the ED.  She speaks in a very regressive childlike voice and demeanor.   Gabapentin makes her depressed, Lyrica too fatigued and dizzy, Cymbalta the same, prednisone "cause my chest to spasm and bladder spasms", only Flexaril 5 mg tolerated.   Last Visit 2015, note by CD .  Her chief complaint today of pain and numbness in the left side of her body and neurologic pain in the face and left scalp, vision changes left eye he came pain in the right hand and right-sided rib cage pain. She also has pelvic pain and abdominal pain especially with defecation. The ROS is  too long to list.  Virginia Shaw is an extensive surgical history and has seen multiple specialists including gastrointestinal workups and cardiology workups. 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 Cholecystectomy 2011,  lower back pain and surgeries in  2001, 2010, 2013, cataract surgery 2005 Virginia. Katy Fitch,  Pituitary "cyst" removal 2009 ,Chiari malformation 2009 and neck surgery . She speaks with a low volume voice , she has  "no energy", takes breaks in between small tasks.  She reports a pain and pressure behind the left eye involving the cheek bone and radiating behind the ear. Closing the Left eye helps the eye pain, she described a sharp  Pain as if stabbed through the eye.  The eye will become puffy. She does not experience this while sleeping. It does not wake her up. She believes the pain is related to eye strain, after reading.    Review of Systems: Out of a complete 14 system review, the patient complains of only the following symptoms, and all other reviewed systems are negative.  Again - Too extensive to document, please a see EMR scanned document.  Dolor saltans et migrans, left leg burning , left abdominal burning, cramping.       Social History   Social History  . Marital status: Married    Spouse name: N/A  . Number of children: 2  . Years of education: GED   Occupational History  .  Unemployed   Social History Main Topics  . Smoking status: Never Smoker  . Smokeless tobacco: Never Used  . Alcohol use No  . Drug use: No  . Sexual activity: No   Other Topics Concern  . Not on file   Social History Narrative   Lives at home with children. ( 2 daughters)   Regular exercise-yes   Caffeine Use-no   GED   Not Working outside home    Family History  Problem Relation Age of Onset  . Hypertension Father   . Hyperlipidemia Father   . Diabetes Father   . Stroke Father   . Heart attack Paternal Grandmother   . Heart attack Paternal Grandfather   . Hypertension Paternal Grandfather   . Cancer Maternal Uncle        lung  . Cancer Paternal Aunt        lung  . Migraines Mother   . Migraines Sister     Past Medical History:  Diagnosis Date  . Abnormal involuntary movements(781.0)   . Allergic rhinitis, cause unspecified   . Anxiety state, unspecified   . Cervical spondylosis without myelopathy   . Disturbance of skin sensation   . Disturbance of skin sensation   . GERD  (gastroesophageal reflux disease)   . History of chicken pox   . Irritable bowel syndrome   . Lumbago   . Myalgia and myositis, unspecified   . Osteoporosis   . Other and unspecified angina pectoris ([place])   . Other malaise and fatigue   . 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[place]) 01/11/2014  . Somatization disorder   . Spasm of muscle   . Systemic sclerosis ([place])   . Unspecified hereditary and idiopathic peripheral neuropathy     Past Surgical History:  Procedure Laterality Date  . ABDOMINAL HYSTERECTOMY  1997  . arnold chiari malformation  2013  . BACK SURGERY  2001, 2011,2013   Lumbar x 2  . BLADDER SURGERY  2011  .  CERVICAL SPINE SURGERY  2013  . CHOLECYSTECTOMY    . COLONOSCOPY  2009   "Normal" Virginia Sharlett Iles  . COLONOSCOPY  2002   "Normal" [place] Freddrick March  . COLONOSCOPY  ~2013   [place]ia Ferdinand Lango.  "Chest pain with the prep" = aborted  . CRANIECTOMY SUBOCCIPITAL W/ CERVICAL LAMINECTOMY / CHIARI  2012   [place]ia Annette Stable, Chiari decompression with suboccipital craniectomy and  . CYSTOCELE REPAIR  2002   Virginia Gaetano Net  . OOPHORECTOMY  1999, 2000  . PITUITARY EXCISION  2010   pituitary cyst removed  . RECTOCELE REPAIR  2002   [place]ia. Gaetano Net  . tear duct tubes  2005  . TUBAL LIGATION      Current Outpatient Prescriptions  Medication Sig Dispense Refill  . amLODipine (NORVASC) 5 MG tablet Take 5 mg by mouth 2 (two) times daily.     Marland Kitchen aspirin 81 MG chewable tablet Chew 81 mg by mouth as needed.     . nitroGLYCERIN (NITROSTAT) 0.4 MG SL tablet Place 0.4 mg under the tongue every 5 (five) minutes as needed.    . promethazine (PHENERGAN) 12.5 MG tablet Take by mouth.     No current facility-administered medications for this visit.     Allergies as of 03/14/2017 - Review Complete 08/24/2015  Allergen Reaction Noted  . Acetaminophen  08/25/2014  . Benzonatate  08/25/2014  . Budesonide Other (See Comments) 08/25/2014  . Ceftin [cefuroxime axetil]  03/25/2012  . Cefuroxime axetil    . Cephalexin  03/25/2012    . Citric acid Hives and Swelling 08/25/2014  . Codeine    . Diazepam  08/25/2014  . Diltiazem  03/25/2012  . Esomeprazole  08/25/2014  . Hydrocodone  08/25/2014  . Hydrocodone-acetaminophen  08/25/2014  . Ibuprofen    . Macrobid [nitrofurantoin monohyd macro] Other (See Comments) 09/22/2012  . Maxalt [rizatriptan]  03/25/2012  . Morphine and related Other (See Comments) 08/25/2014  . Naproxen  08/25/2014  . Nsaids  03/25/2012  . Omeprazole  03/25/2012  . Oxycodone  08/25/2014  . Oxycodone-acetaminophen  08/25/2014  . Penicillins    . Phenazopyridine hcl  03/25/2012  . Prednisone Other (See Comments) 08/25/2014  . Protonix [pantoprazole sodium]  03/25/2012    Vitals: There were no vitals taken for this visit. Last Weight:  Wt Readings from Last 1 Encounters:  08/24/15 130 lb (59 kg)       Last Height:   Ht Readings from Last 1 Encounters:  08/24/15 5\' 1"  (1.549 m)    Physical exam:  General: The patient is awake, alert and appears not in acute distress, but depressed and meek. The patient is well groomed. Head: Normocephalic, atraumatic. Neck is supple. Mallampati 1   neck circumference: 14 . Nasal airflow unrestricted , TMJ is not evident . Retrognathia is not seen.  Cardiovascular:  Regular rate and rhythm , without  murmurs or carotid bruit, and without distended neck veins. She has normal palpable pulses in all extremities, wrist and ankles, retro-patella Respiratory: Lungs are clear to auscultation. Skin:  Without evidence of edema, or rash. She is not going outside, is pale.  Trunk: BMI is normal , as is posture.  Neurologic exam : The patient is awake and alert, oriented to place and time.    Very low volume, regressive.  Mood and affect : Meek.   Cranial nerves: Pupils are equal and briskly reactive to light.  Dry eye . Cataract left (beginning). Bilateral functional ptosis that resolves with upward gaze.  Extraocular  movements in vertical and horizontal  planes intact and without nystagmus. Patient complains of pain behind the left eye with eye movements . Visual fields by finger perimetry are intact. Hearing to finger rub intact.  Facial sensation intact to fine touch. Facial motor strength is asymmetric , the left mouth is lower,  tongue and uvula move midline.  Motor exam:  Normal tone ,muscle bulk and symmetric, strength in all extremities. She reports aching pain in her shoulders when shrugging.  She has subjective stiffness and joint pain restricting her range of motion.  Her left patella is attenuated, but there is no weakness.  Sensory:  Fine touch, pinprick and vibration were normal in all extremities. Proprioception is tested in the upper extremities only. This was normal. Coordination: Rapid alternating movements in the fingers/hands is normal. Finger-to-nose maneuver normal without evidence of ataxia, dysmetria or tremor. Gait and station: Patient walks without assistive device and is able unassisted to climb up to the exam table. Strength within normal limits. Stance is stable and normal. Tandem gait is unfragmented. Romberg testing is negative. She reports ongoing abdominal pain in the left groin and flank-  Deep tendon reflexes: in the  upper and lower extremities are symmetric and intact. Babinski maneuver response is downgoing.   An MRI of the cervical spine was ordered by Virginia. Deri Fuelling performed on 10/02/2016 and showed postsurgical changes related to a transsphenoidal pituitary excision. Pituitary gland was morphologically normal 5.9 mm local related cystic region represents postsurgical change. Bilateral maxillary sinus mucus retention cysts with patches of opacification.  An MRI of the cervical spine from C4 and C5 mild retrolisthesis, which remained stable. Small disc bulge small osteophytes no significant canal stenosis status post discectomy C6-7 and C7-T1 with good postoperative healing. There are  stable postsurgical  changes related to an Arnold-Chiari craniectomy with a widely patent foramen magnum.  Assessment:  After physical and neurologic examination, review of laboratory studies, imaging, neurophysiology testing and pre-existing records, assessment is :  Multifocal pains, some sharp some throbbing, left leg radiculopathy followed by Virginia Trenton Gammon- I don't see any changes over the last 3 years - only a desperate patient in search for a diagnosis.  Her X rays and MRIs are normal,  Side effect report related to non opioid pain medication is as expected positive.  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   Dear Virginia Shaw : I have seen Virginia Shaw many years ago and  Again 3 years ago, at each time explained that I found nothing treatment- wise to offer. The same is true in her third visit with me .   She had seen Virginia Trenton Gammon , who ruled out demyelination, myelopathy - [place]. Levora Angel supposingly did a myelogram. If that is done, I will not be able to address this multifocal pain disorder further.  A pain management [facility], referred there by Virginia Gertie Fey )  and psychological support should be established.  Psychiatric evaluation - consider personality disorder . Rpatient has a specialist in [facility], is seen by pain specialist.    I have concerns about a non organic nature of the symptoms.   Visit duration was 45 minutes.    Asencion Partridge Paije Goodhart MD  03/14/2017

## 2017-03-20 ENCOUNTER — Ambulatory Visit: Payer: Medicaid Other | Attending: Adult Health | Admitting: Physical Therapy

## 2017-03-20 ENCOUNTER — Encounter: Payer: Self-pay | Admitting: Physical Therapy

## 2017-03-20 DIAGNOSIS — R293 Abnormal posture: Secondary | ICD-10-CM | POA: Diagnosis present

## 2017-03-20 DIAGNOSIS — R252 Cramp and spasm: Secondary | ICD-10-CM

## 2017-03-20 DIAGNOSIS — M6281 Muscle weakness (generalized): Secondary | ICD-10-CM | POA: Diagnosis present

## 2017-03-20 NOTE — Patient Instructions (Addendum)
[facility] [address] [place], [place] [address] [phone] [email or web address]   Scar Massage  Scar massage is done to improve the mobility of scar, decrease scar tissue from building up, reduce adhesions, and prevent Keloids from forming. Start scar massage after scabs have fallen off by themselves and no open areas. The first few weeks after surgery, it is normal for a scar to appear pink or red and slightly raised. Scars can itch or have areas of numbness. Some scars may be sensitive.   Direct Scar massage: after scar is healed, no opening, no scab 1.  Place pads of two fingers together directly on the scar starting at one end of the scar. Move the fingers up and down across the scar holding 5 seconds one direction.  Then go opposite direction hold 5 seconds.  2. Move over to the next section of the scar and repeat.  Work your way along the entire length of the scar.   3. Next make diagonal movements along the scar holding 5 seconds at one direction. 4. Next movement is side to side. 5. Do not rub fingers over the scar.  Instead keep firm pressure and move scar over the tissue it is on top   Scar Lift and Roll 12 weeks after surgery. 1. Pinch a small amount of the scar between your first two fingers and thumb.  2. Roll the scar between your fingers for 5 to 15 seconds. 3. Move along the scar and repeat until you have massaged the entire length of scar.   Stop the massage and call your doctor if you notice: 1. Increased redness 2. Bleeding from scar 3. Seepage coming from the scar 4. Scar is warmer and has increased pain   About Abdominal Massage  Abdominal massage, also called external colon massage, is a self-treatment circular massage technique that can reduce and eliminate gas and ease constipation. The colon naturally contracts in waves in a clockwise direction starting from inside the right hip, moving up toward the ribs, across the belly, and down inside the  left hip.  When you perform circular abdominal massage, you help stimulate your colon's normal wave pattern of movement called peristalsis.  It is most beneficial when done after eating.  Positioning You can practice abdominal massage with oil while lying down, or in the shower with soap.  Some people find that it is just as effective to do the massage through clothing while sitting or standing.  How to Massage Start by placing your finger tips or knuckles on your right side, just inside your hip bone.  . Make small circular movements while you move upward toward your rib cage.   . Once you reach the bottom right side of your rib cage, take your circular movements across to the left side of the bottom of your rib cage.  . Next, move downward until you reach the inside of your left hip bone.  This is the path your feces travel in your colon. . Continue to perform your abdominal massage in this pattern for 10 minutes each day.     You can apply as much pressure as is comfortable in your massage.  Start gently and build pressure as you continue to practice.  Notice any areas of pain as you massage; areas of slight pain may be relieved as you massage, but if you have areas of significant or intense pain, consult with your healthcare provider.  Other Considerations . General physical activity including bending and  stretching can have a beneficial massage-like effect on the colon.  Deep breathing can also stimulate the colon because breathing deeply activates the same nervous system that supplies the colon.   . Abdominal massage should always be used in combination with a bowel-conscious diet that is high in the proper type of fiber for you, fluids (primarily water), and a regular exercise program.  Hook-Lying    Lie with hips and knees bent. Allow body's muscles to relax. Place hands on belly. Inhale slowly and deeply for _3__ seconds, so hands move up. Then take _3__ seconds to exhale. Repeat 2  minutes 3__ times a day.   Copyright  VHI. All rights reserved.  Supine Knee-to-Chest, Unilateral    Lie on back, hands clasped behind one knee. Pull knee in toward chest until a comfortable stretch is felt in lower back and buttocks. Hold _14__ seconds.  Repeat _1__ times per session. Do _1__ sessions per day. Keep face relaxed  Copyright  VHI. All rights reserved.   [facility] [address] [place], [place] [address] Phone # [phone] Fax [phone]
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
## 2017-03-21 NOTE — Addendum Note (Signed)
Addended by: Earlie Counts F on: 03/21/2017 04:37 PM   Modules accepted: Orders

## 2017-07-09 ENCOUNTER — Other Ambulatory Visit: Payer: Self-pay | Admitting: Obstetrics and Gynecology

## 2017-07-09 DIAGNOSIS — N644 Mastodynia: Secondary | ICD-10-CM

## 2017-07-09 DIAGNOSIS — N63 Unspecified lump in unspecified breast: Secondary | ICD-10-CM

## 2017-07-11 ENCOUNTER — Ambulatory Visit: Payer: Self-pay

## 2017-07-11 ENCOUNTER — Ambulatory Visit
Admission: RE | Admit: 2017-07-11 | Discharge: 2017-07-11 | Disposition: A | Discharge status: Home / Self Care | Payer: Medicaid Other | Source: Ambulatory Visit | Attending: Obstetrics and Gynecology | Admitting: Obstetrics and Gynecology

## 2017-07-11 DIAGNOSIS — N644 Mastodynia: Secondary | ICD-10-CM

## 2017-07-11 DIAGNOSIS — N63 Unspecified lump in unspecified breast: Secondary | ICD-10-CM

## 2017-08-22 ENCOUNTER — Ambulatory Visit: Payer: Self-pay | Admitting: General Surgery

## 2017-08-22 NOTE — H&P (Signed)
History of Present Illness Ralene Ok MD; 08/22/2017 11:07 AM) The patient is a 50 year old female who presents with an inguinal hernia. Referred by: Dr. Gertie Fey Chief Complaint: Left inguinal hernia  Patient is a 51 year old female with a history of TIAs, angina, and a left inguinal hernia. Patient states she's had pain to her left inguinal area for several months. She states that the pain radiates the lower medial thigh. She states this sometimes bilaterally. Patient underwent ultrasound Dr. Marcello Moores office which revealed a possible hernia. Patient states that it With Valsalva, coughing, sneezing, bowel movements caused the pain to be provoked. She states avoiding the sometimes causes the pain to be released. There are no other modifying factors.    Diagnostic Studies History (Tanisha A. Owens Shark, [place]; 08/22/2017 10:20 AM) Mammogram  within last year Pap Smear  1-5 years ago  Allergies (Tanisha A. Owens Shark, RMA; 08/22/2017 10:29 AM) Iodinated Diagnostic Agents  Protonix *ULCER DRUGS*  Acetaminophen *ANALGESICS - NonNarcotic*  Benzonatate *COUGH/COLD/ALLERGY*  Budesonide *ANTIASTHMATIC AND BRONCHODILATOR AGENTS*  Buprenorphine *ANALGESICS - OPIOID*  Ceftin *CEPHALOSPORINS*  Cefuroxime Axetil *CEPHALOSPORINS*  Cephalexin *CEPHALOSPORINS*  Citric Acid *CHEMICALS*  Clindamycin HCl *ANTI-INFECTIVE AGENTS - MISC.*  Codeine Phosphate *ANALGESICS - OPIOID*  Cortisone *CORTICOSTEROIDS*  DiazePAM *ANTIANXIETY AGENTS*  Diclofenac *ANALGESICS - ANTI-INFLAMMATORY*  DilTIAZem CD *CALCIUM CHANNEL BLOCKERS*  Doxycycline *DERMATOLOGICALS*  Esomeprazole Magnesium *ULCER DRUGS*  Hydrocodone-Acetaminophen *ANALGESICS - OPIOID*  Ibuprofen *ANALGESICS - ANTI-INFLAMMATORY*  Macrobid *URINARY ANTI-INFECTIVES*  Maxalt *MIGRAINE PRODUCTS*  Morphine Sulfate (Concentrate) *ANALGESICS - OPIOID*  Naproxen *ANALGESICS - ANTI-INFLAMMATORY*  NSAIDs  Omeprazole *CHEMICALS*   OxyCODONE HCl *ANALGESICS - OPIOID*  Penicillins  Phenazopyridine HCl *GENITOURINARY AGENTS - MISCELLANEOUS*  PredniSONE *CORTICOSTEROIDS*  Tomato (Diagnostic) *DIAGNOSTIC PRODUCTS*  Allergies Reconciled   Medication History (Tanisha A. Owens Shark, RMA; 08/22/2017 10:30 AM) Amlodipine-Atorvastatin (5-10MG  Tablet, Oral) Active. Aspirin (81MG  Tablet Chewable, Oral) Active. Cyclobenzaprine HCl (5MG  Tablet, Oral) Active. Isosorbide Mononitrate (20MG  Tablet, Oral) Active. Nitroglycerin (0.4MG  Tab Sublingual, Sublingual) Active. Promethazine HCl (12.5MG  Tablet, Oral) Active. Medications Reconciled  Social History (Tanisha A. Owens Shark, [place]; 08/22/2017 10:20 AM) No alcohol use  No caffeine use  No drug use  Tobacco use  Never smoker.  Family History (Tanisha A. Owens Shark, [place]; 08/22/2017 10:20 AM) Bleeding disorder  Mother. Cerebrovascular Accident  Father. Diabetes Mellitus  Father. Heart Disease  Father, Mother. Hypertension  Father. Migraine Headache  Mother, Sister.  Pregnancy / Birth History (Tanisha A. Owens Shark, RMA; 08/22/2017 10:20 AM) Durenda Age  2 Maternal age  3-25 Para  2  Other Problems (Tanisha A. Owens Shark, [place]; 08/22/2017 10:20 AM) Arthritis  Back Pain  Bladder Problems  Cerebrovascular Accident  Chest pain  General anesthesia - complications  Kidney Stone  Lump In Breast  Thyroid Disease  Vascular Disease     Review of Systems Ralene Ok MD; 08/22/2017 11:06 AM) General Present- Weight Gain. Not Present- Appetite Loss, Chills, Fatigue, Fever, Night Sweats and Weight Loss. Skin Present- Non-Healing Wounds. Not Present- Change in Wart/Mole, Dryness, Hives, Jaundice, New Lesions, Rash and Ulcer. HEENT Present- Earache, Hoarseness, Seasonal Allergies, Sinus Pain, Sore Throat, Visual Disturbances and Wears glasses/contact lenses. Not Present- Hearing Loss, Nose Bleed, Oral Ulcers, Ringing in the Ears and Yellow Eyes. Respiratory  Present- Difficulty Breathing. Not Present- Bloody sputum, Chronic Cough, Snoring and Wheezing. Breast Present- Breast Mass, Breast Pain and Nipple Discharge. Not Present- Skin Changes. Cardiovascular Present- Chest Pain, Difficulty Breathing Lying Down, Leg Cramps, Palpitations, Shortness of Breath and Swelling of Extremities. Not Present- Rapid Heart Rate. Gastrointestinal Present- Abdominal Pain,  Bloating, Change in Bowel Habits, Nausea and Rectal Pain. Not Present- Bloody Stool, Chronic diarrhea, Constipation, Difficulty Swallowing, Excessive gas, Gets full quickly at meals, Hemorrhoids, Indigestion and Vomiting. Female Genitourinary Present- Frequency, Nocturia, Painful Urination, Pelvic Pain and Urgency. Musculoskeletal Present- Back Pain, Joint Pain, Joint Stiffness, Muscle Pain, Muscle Weakness and Swelling of Extremities. Neurological Present- Headaches, Numbness, Tingling, Trouble walking and Weakness. Not Present- Decreased Memory, Fainting, Seizures and Tremor. Psychiatric Present- Change in Sleep Pattern. Not Present- Anxiety, Bipolar, Depression, Fearful and Frequent crying. Endocrine Present- Cold Intolerance. Not Present- Excessive Hunger, Hair Changes, Heat Intolerance, Hot flashes and New Diabetes. Hematology Present- Blood Thinners, Easy Bruising and Gland problems. Not Present- Excessive bleeding, HIV and Persistent Infections. All other systems negative  Vitals (Tanisha A. Brown RMA; 08/22/2017 10:21 AM) 08/22/2017 10:21 AM Weight: 147.5 lb Height: 60in Body Surface Area: 1.64 m Body Mass Index: 28.81 kg/m  Temp.: 98.56F  Pulse: 87 (Regular)  BP: 98/62 (Sitting, Left Arm, Standard)       Physical Exam Ralene Ok MD; 08/22/2017 11:07 AM) The physical exam findings are as follows: Note:Constitutional: No acute distress, conversant, appears stated age  Eyes: Anicteric sclerae, moist conjunctiva, no lid lag  Neck: No thyromegaly, trachea midline,  no cervical lymphadenopathy  Lungs: Clear to auscultation biilaterally, normal respiratory effot  Cardiovascular: regular rate & rhythm, no murmurs, no peripheal edema, pedal pulses 2+  GI: Soft, no masses or hepatosplenomegaly, non-tender to palpation  MSK: Normal gait, no clubbing cyanosis, edema  Skin: No rashes, palpation reveals normal skin turgor  Psychiatric: Appropriate judgment and insight, oriented to person, place, and time  Abdomen Inspection Hernias - Inguinal hernia - Bilateral - Reducible(, Small with some weakness).    Assessment & Plan Ralene Ok MD; 08/22/2017 11:08 AM) BILATERAL INGUINAL HERNIA WITHOUT OBSTRUCTION OR GANGRENE, RECURRENCE NOT SPECIFIED (K40.20) Impression: 51 year old female with symptomatic left and likely right inguinal hernia. Patient has a history of TIAs as well as angina. We will get clearance by Dr. Durene Romans and Dr. Leroy Sea. These are her cardiologist and neurologist respectively.  1. The patient will like to proceed to the operating room for open bilateral inguinal hernia repair with mesh.  2. I discussed with the patient the signs and symptoms of incarceration and strangulation and the need to proceed to the ER should they occur.  3. I discussed with the patient the risks and benefits of the procedure to include but not limited to: Infection, bleeding, damage to surrounding structures, possible need for further surgery, possible nerve pain, and possible recurrence. The patient was understanding and wishes to proceed.

## 2017-10-30 ENCOUNTER — Ambulatory Visit: Admit: 2017-10-30 | Payer: Medicaid Other | Admitting: General Surgery

## 2017-10-30 SURGERY — REPAIR, HERNIA, INGUINAL, BILATERAL, ADULT
Anesthesia: General | Laterality: Bilateral

## 2017-11-08 ENCOUNTER — Ambulatory Visit: Payer: Medicaid Other | Admitting: Family Medicine

## 2018-05-20 ENCOUNTER — Other Ambulatory Visit: Payer: Self-pay | Admitting: Physician Assistant

## 2018-05-20 ENCOUNTER — Ambulatory Visit
Admission: RE | Admit: 2018-05-20 | Discharge: 2018-05-20 | Disposition: A | Discharge status: Home / Self Care | Payer: Medicaid Other | Source: Ambulatory Visit | Attending: Physician Assistant | Admitting: Physician Assistant

## 2018-05-20 DIAGNOSIS — M545 Low back pain: Secondary | ICD-10-CM

## 2018-05-20 DIAGNOSIS — M542 Cervicalgia: Secondary | ICD-10-CM

## 2018-06-16 ENCOUNTER — Encounter: Payer: Self-pay | Admitting: Physical Therapy

## 2018-06-16 ENCOUNTER — Other Ambulatory Visit: Payer: Self-pay

## 2018-06-16 ENCOUNTER — Ambulatory Visit: Payer: Medicaid Other | Attending: Family Medicine | Admitting: Physical Therapy

## 2018-06-16 DIAGNOSIS — M545 Low back pain: Secondary | ICD-10-CM | POA: Insufficient documentation

## 2018-06-16 DIAGNOSIS — G8929 Other chronic pain: Secondary | ICD-10-CM | POA: Diagnosis present

## 2018-06-16 DIAGNOSIS — R252 Cramp and spasm: Secondary | ICD-10-CM | POA: Diagnosis present

## 2018-06-16 DIAGNOSIS — M6281 Muscle weakness (generalized): Secondary | ICD-10-CM | POA: Diagnosis present

## 2018-06-16 DIAGNOSIS — R293 Abnormal posture: Secondary | ICD-10-CM | POA: Insufficient documentation

## 2018-06-16 DIAGNOSIS — M542 Cervicalgia: Secondary | ICD-10-CM | POA: Insufficient documentation

## 2018-06-16 NOTE — Therapy (Signed)
[place], [place], [address] Phone: [phone]   Fax:  [phone]  Physical Therapy Evaluation  Patient Details  Name: Virginia Shaw MRN: [id number] Date of Birth: [birth year] Referring Provider: [name], MD   Encounter Date: 06/16/2018  PT End of Session - 06/16/18 1549    Visit Number  1    Number of Visits  4    Date for PT Re-Evaluation  08/04/18    Authorization Type  MCD    PT Start Time  1545    PT Stop Time  1634    PT Time Calculation (min)  49 min    Activity Tolerance  Patient tolerated treatment well    Behavior During Therapy  [facility] for tasks assessed/performed       Past Medical History:  Diagnosis Date  . Abnormal involuntary movements(781.0)   . Allergic rhinitis, cause unspecified   . Anxiety state, unspecified   . Cervical spondylosis without myelopathy   . Disturbance of skin sensation   . Disturbance of skin sensation   . GERD (gastroesophageal reflux disease)   . History of chicken pox   . Irritable bowel syndrome   . Lumbago   . Myalgia and myositis, unspecified   . Osteoporosis   . Other and unspecified angina pectoris   . Other malaise and fatigue   . 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 Somatization disorder   . Spasm of muscle   . Systemic sclerosis ([place])   . Unspecified hereditary and idiopathic peripheral neuropathy     Past Surgical History:  Procedure Laterality Date  . ABDOMINAL HYSTERECTOMY  1997  . arnold chiari malformation  2013  . BACK SURGERY  2001, 2011,2013   Lumbar x 2  . BLADDER SURGERY  2011  . CERVICAL SPINE SURGERY  2013  . CHOLECYSTECTOMY    . COLONOSCOPY  2009   "Normal" Dr Sharlett Iles  . COLONOSCOPY  2002   "Normal" Dr Freddrick March  . COLONOSCOPY  ~2013   Dr Ferdinand Lango.  "Chest pain with the prep" = aborted  . CRANIECTOMY SUBOCCIPITAL W/ CERVICAL LAMINECTOMY / CHIARI  2012   Dr Annette Stable, Chiari decompression with suboccipital  craniectomy and  . CYSTOCELE REPAIR  2002   Dr Gaetano Net  . OOPHORECTOMY  1999, 2000  . PITUITARY EXCISION  2010   pituitary cyst removed  . RECTOCELE REPAIR  2002   Dr. Gaetano Net  . tear duct tubes  2005  . TUBAL LIGATION      There were no vitals filed for this visit.   Subjective Assessment - 06/16/18 1550    Subjective  I have had 3 back sugeries, problems with sciatic nerve. Middle back and Lt ribs and lower Lt back is the worst. Lt cervical area is more painful. Numbness in Lt side of face that becomes tense- like somebody gave me too much novacane at least 1 ro2 /day. Numbness in Lt UE & LE with dull pain but occasionally becomes severe- this can increase at night which wakes me up. Sharp/stabbing neck pain on Lt side. HA that shoots from upper shoulder to behind eye. Lightheaded and foggy feeling. Hands tend to lock up that comes with use and sleep- more on Rt than Lt but they do not usually happen together, it depends which one I am using more. When I sit on the floor (5-10 min) or in a chair (20-30 min) my saddle area goes numb, esp on the Right. I do loose  control of my bladder when this happens.     Patient Stated Goals  decrease pain, improve function    Currently in Pain?  Yes    Pain Score  --   high levels, no number provided   Pain Location  Neck   head/neck/shoulder (Lt)   Pain Orientation  Mid;Left    Pain Descriptors / Indicators  Aching;Sharp;Sore;Spasm;Tingling;Numbness    Pain Type  Chronic pain    Aggravating Factors   upright posture, reaching overhead    Pain Relieving Factors  lay down         [facility] PT Assessment - 06/16/18 0001      Assessment   Medical Diagnosis  Neck & low back pain    Referring Provider  [name], MD    Onset Date/Surgical Date  --   Feb, 1997 cervical- approx; Sept 2001 back   Hand Dominance  Right    Next MD Visit  --   neurologist Oct, 2019   Prior Therapy  not this year      Balance Screen   Has the patient fallen in  the past 6 months  Yes    How many times?  1   trying to get onto lawn mower   Has the patient had a decrease in activity level because of a fear of falling?   No    Is the patient reluctant to leave their home because of a fear of falling?   No      Home Social worker  Private residence    Living Arrangements  Children    Additional Comments  3 steps at front door      Prior Function   Level of Independence  Independent      Cognition   Overall Cognitive Status  Within Functional Limits for tasks assessed      Sensation   Additional Comments  N/T bil hands (first 3 digits); Lt LE- hip and calf      Posture/Postural Control   Posture Comments  rounded shoulders, forward head, increased kyphosis, decreasedlumbar lordosis      ROM / Strength   AROM / PROM / Strength  AROM;Strength      AROM   Overall AROM Comments  gross GHJ ROM WFL but is painful and creates Lt-sided HA pain      Strength   Overall Strength Comments  Gross UE strength 4+/5, GHJ ER bil 3+/5, pain in Lt UE with all tests                Objective measurements completed on examination: See above findings.      [place] Adult PT Treatment/Exercise - 06/16/18 0001      Exercises   Exercises  Other Exercises    Other Exercises   cervical & scap retraction, upper trap stretch, alignment in mirror             PT Education - 06/16/18 1730    Education Details  anatomy of condition, POC, HEP, exercise form/rational, MCD authorizations    Person(s) Educated  Patient    Methods  Explanation;Demonstration;Tactile cues;Verbal cues;Handout    Comprehension  Verbalized understanding;Returned demonstration;Verbal cues required;Tactile cues required;Need further instruction       PT Short Term Goals - 06/16/18 1741      PT SHORT TERM GOAL #1   Title  Pt will demo proper postural alignment with minimal cues required    Baseline  heavy cuing along biomechanical chain  at eval    Time   3   time to accommodate for MCD authorization time   Period  Weeks    Status  New    Target Date  08/04/18      PT SHORT TERM GOAL #2   Title  Pt will verbalize independence with HEP as it has been established in the short term    Baseline  began creating at eval    Time  3    Period  Weeks    Status  New    Target Date  08/04/18      PT SHORT TERM GOAL #3   Title  Pt will be able to sit comfortably for at least 10 min    Baseline  5 min at eval    Time  3    Period  Weeks    Status  New    Target Date  08/04/18        PT Long Term Goals - 06/16/18 1743      PT LONG TERM GOAL #1   Title  Pt will be able to complete household chores pain <=4/10    Baseline  severe and limiting at eval    Time  9    Period  Weeks    Status  New    Target Date  08/22/18      PT LONG TERM GOAL #2   Title  Pt will be able to sit and be supine without numbness in UE or LE    Baseline  N/t begins after 5 min at eval    Time  9    Period  Weeks    Status  New    Target Date  08/22/18      PT LONG TERM GOAL #3   Title  Pt will be able to sleep without limitation by pain    Baseline  woken multiple times at night at eval    Time  9    Period  Weeks    Status  New    Target Date  08/22/18      PT LONG TERM GOAL #4   Title  Resolution of HA/jaw pain referred from Lt shoulder    Baseline  feels the pain shooting from shoulder to head/eye    Time  9    Period  Weeks    Status  New    Target Date  08/22/18             Plan - 06/16/18 1731    Clinical Impression Statement  Pt presents to PT with complaints of cervical pain that extends into bil shoulders (Lt>Rt) with frequent HA and N/t in bil hands. Lt-sided TMJ pain noted and instructed in resting posture and self traction. Also with complaints of chronic LBP with numbness in Lt hip and calf. Evaluation focused on correction of resting posture and knowledge of body alignment to prepare for addition of exercises at next visit.  Gross strength deficits with pain as well as ROM that is near [facility]- limited by kyphotic posture- but painful due to postural alignment along entire biomechanical chain. LE strength/ROM not formally tested today but pt unable to stand upright fully through her hips. Pt will benefit from skilled PT in order to improve biomechanical chain flexibility, strength, endurance and alignment to meet long term functional goals.     History and Personal Factors relevant to plan of care:  anxiety, spondylosis, OP, systemic sclerosis  Clinical Presentation  Unstable    Clinical Presentation due to:  unstable N/T and saddle anesthesia with loss of bladder control    Clinical Decision Making  High    Rehab Potential  Good    PT Frequency  --   3 visits in first auth followed by 2/week for 6 weeks   PT Treatment/Interventions  ADLs/Self Care Home Management;Cryotherapy;Electrical Stimulation;Iontophoresis 4mg /ml Dexamethasone;Moist Heat;Therapeutic exercise;Therapeutic activities;Functional mobility training;Stair training;Gait training;Ultrasound;Neuromuscular re-education;Patient/family education;Manual techniques;Taping;Dry needling;Passive range of motion    PT Next Visit Plan  gross periscap & lumbopelvic strengthening to HEP    PT Home Exercise Plan  scapular retraction, cervical retraction, upper trap stretch, neutral alignment in mirror    Consulted and Agree with Plan of Care  Patient       Patient will benefit from skilled therapeutic intervention in order to improve the following deficits and impairments:  Decreased endurance, Impaired sensation, Decreased activity tolerance, Decreased strength, Impaired UE functional use, Pain, Increased muscle spasms, Difficulty walking, Decreased mobility, Improper body mechanics, Decreased range of motion, Impaired flexibility, Postural dysfunction  Visit Diagnosis: Cervicalgia - Plan: PT plan of care cert/re-cert  Chronic left-sided low back pain, with sciatica  presence unspecified - Plan: PT plan of care cert/re-cert  Muscle weakness (generalized) - Plan: PT plan of care cert/re-cert  Cramp and spasm - Plan: PT plan of care cert/re-cert  Abnormal posture - Plan: PT plan of care cert/re-cert     Problem List Patient Active Problem List   Diagnosis Date Noted  . Somatic complaints, multiple 03/14/2017  . Chronic pain syndrome 08/25/2014  . Irritable bowel syndrome (IBS) 01/12/2014  . 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 Chronic abdominal pain 01/11/2014  . Chronic venous insufficiency 10/30/2013  . Varicose veins 10/30/2013  . Chest tightness 03/25/2012  . 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 RECTAL BLEEDING 08/26/2008  . UTI'S, RECURRENT 08/26/2008   Arvid Marengo C. Toiya Morrish PT, DPT 06/16/18 5:51 PM   [place] [facility] [address] [place], [place], [address] Phone: [phone]   Fax:  [phone]  Name: Virginia Shaw MRN: [id number] Date of Birth: [birth year]

## 2018-06-23 ENCOUNTER — Ambulatory Visit: Payer: Medicaid Other | Admitting: Physical Therapy

## 2018-06-23 ENCOUNTER — Encounter: Payer: Self-pay | Admitting: Physical Therapy

## 2018-06-23 DIAGNOSIS — M542 Cervicalgia: Secondary | ICD-10-CM

## 2018-06-23 DIAGNOSIS — R252 Cramp and spasm: Secondary | ICD-10-CM

## 2018-06-23 DIAGNOSIS — M6281 Muscle weakness (generalized): Secondary | ICD-10-CM

## 2018-06-23 DIAGNOSIS — M545 Low back pain: Secondary | ICD-10-CM

## 2018-06-23 DIAGNOSIS — R293 Abnormal posture: Secondary | ICD-10-CM

## 2018-06-23 DIAGNOSIS — G8929 Other chronic pain: Secondary | ICD-10-CM

## 2018-06-23 NOTE — Therapy (Signed)
[place], [place], [address] Phone: [phone]   Fax:  [phone]  Physical Therapy Treatment  Patient Details  Name: Virginia Shaw MRN: [id number] Date of Birth: 07-Jun-1966 Referring Provider: [name], MD   Encounter Date: 06/23/2018  PT End of Session - 06/23/18 1525    Visit Number  2    Number of Visits  4    Date for PT Re-Evaluation  08/04/18    PT Start Time  2831    PT Stop Time  1513    PT Time Calculation (min)  56 min    Activity Tolerance  Patient tolerated treatment well    Behavior During Therapy  [facility] for tasks assessed/performed       Past Medical History:  Diagnosis Date  . Abnormal involuntary movements(781.0)   . Allergic rhinitis, cause unspecified   . Anxiety state, unspecified   . Cervical spondylosis without myelopathy   . Disturbance of skin sensation   . Disturbance of skin sensation   . GERD (gastroesophageal reflux disease)   . History of chicken pox   . Irritable bowel syndrome   . Lumbago   . Myalgia and myositis, unspecified   . Osteoporosis   . Other and unspecified angina pectoris   . Other malaise and fatigue   . 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[place]) 01/11/2014  . Somatization disorder   . Spasm of muscle   . Systemic sclerosis ([place])   . Unspecified hereditary and idiopathic peripheral neuropathy     Past Surgical History:  Procedure Laterality Date  . ABDOMINAL HYSTERECTOMY  1997  . arnold chiari malformation  2013  . BACK SURGERY  2001, 2011,2013   Lumbar x 2  . BLADDER SURGERY  2011  . CERVICAL SPINE SURGERY  2013  . CHOLECYSTECTOMY    . COLONOSCOPY  2009   "Normal" Dr Sharlett Iles  . COLONOSCOPY  2002   "Normal" Dr Freddrick March  . COLONOSCOPY  ~2013   Dr Ferdinand Lango.  "Chest pain with the prep" = aborted  . CRANIECTOMY SUBOCCIPITAL W/ CERVICAL LAMINECTOMY / CHIARI  2012   Dr Annette Stable, Chiari decompression with suboccipital craniectomy and  . CYSTOCELE REPAIR   2002   Dr Gaetano Net  . OOPHORECTOMY  1999, 2000  . PITUITARY EXCISION  2010   pituitary cyst removed  . RECTOCELE REPAIR  2002   Dr. Gaetano Net  . tear duct tubes  2005  . TUBAL LIGATION      There were no vitals filed for this visit.  Subjective Assessment - 06/23/18 1422    Subjective  Patient has been doing the exercises however she is not sure they are done correctly.     Currently in Pain?  Yes    Pain Score  6     Pain Location  Neck    Pain Orientation  Left    Pain Descriptors / Indicators  Aching;Numbness;Tingling;Sore;Spasm    Pain Radiating Towards  behind left eye    Aggravating Factors   upright posture,  reaching overhead,  sitting on hard surface    Pain Relieving Factors  laying on side  right.    Effect of Pain on Daily Activities  arms get weak,, trouble sleeping,  arms get numb with washing hair.      Multiple Pain Sites  --   low back,  left side of female area worse with sitting  15 soft surface,  10 hard surface.   Low back pain 4/10,  radiated  into anterior hip left.   into left calf ( achey pain all the time,  nerve and muscle pain.  tight,  anterior shin.                      [place] Adult PT Treatment/Exercise - 06/23/18 0001      Self-Care   Self-Care  Posture    Posture  education on neutral spine,   mirror used for self correction keeps head laterally shifted      Exercises   Other Exercises   supine decompression .  shoulder press, head press 5 X 5 seconds,  pillows under head and arms,  big bolster under legs for leg press 5 seconds each 5 X, and leg lengthener 5 X 5 seconds.     felt stretching and pain     Neck Exercises: Seated   Neck Retraction  10 reps   sits with head lateral flexed,  able to do with cues   Other Seated Exercise  scapular retraction.   mirror used to assist head posture keeps right head lateral     Shoulder Exercises: Supine   Other Supine Exercises  head press, shoulder press leg press 5 x each,  5 seconds  hold,  extra pillows needed,  leg lengthener 5 x 5 seconds each.  painful with moderate cues        Shoulder Exercises: Seated   Retraction  5 reps      Shoulder Exercises: Standing   Retraction  AROM;Both;5 reps   also sitting 5X moderate cues     Shoulder Exercises: Stretch   Other Shoulder Stretches  1st rib stretch with sheet,  moderate cued,  pain and pulling noted.     Other Shoulder Stretches  door way stretch, 3 X 20 seconds   moderate cues,  needed moderate cues     Modalities   Modalities  Cryotherapy      Cryotherapy   Number Minutes Cryotherapy  5 Minutes    Cryotherapy Location  Cervical    Type of Cryotherapy  --   cold pack,  supine with legs up on high bolster            PT Education - 06/23/18 1525    Education Details  posture,  exercise form    Person(s) Educated  Patient    Methods  Explanation;Demonstration;Tactile cues;Verbal cues    Comprehension  Verbalized understanding;Returned demonstration;Need further instruction       PT Short Term Goals - 06/16/18 1741      PT SHORT TERM GOAL #1   Title  Pt will demo proper postural alignment with minimal cues required    Baseline  heavy cuing along biomechanical chain at eval    Time  3   time to accommodate for MCD authorization time   Period  Weeks    Status  New    Target Date  08/04/18      PT SHORT TERM GOAL #2   Title  Pt will verbalize independence with HEP as it has been established in the short term    Baseline  began creating at eval    Time  3    Period  Weeks    Status  New    Target Date  08/04/18      PT SHORT TERM GOAL #3   Title  Pt will be able to sit comfortably for at least 10 min    Baseline  5 min at  eval    Time  3    Period  Weeks    Status  New    Target Date  08/04/18        PT Long Term Goals - 06/16/18 1743      PT LONG TERM GOAL #1   Title  Pt will be able to complete household chores pain <=4/10    Baseline  severe and limiting at eval    Time  9     Period  Weeks    Status  New    Target Date  08/22/18      PT LONG TERM GOAL #2   Title  Pt will be able to sit and be supine without numbness in UE or LE    Baseline  N/t begins after 5 min at eval    Time  9    Period  Weeks    Status  New    Target Date  08/22/18      PT LONG TERM GOAL #3   Title  Pt will be able to sleep without limitation by pain    Baseline  woken multiple times at night at eval    Time  9    Period  Weeks    Status  New    Target Date  08/22/18      PT LONG TERM GOAL #4   Title  Resolution of HA/jaw pain referred from Lt shoulder    Baseline  feels the pain shooting from shoulder to head/eye    Time  9    Period  Weeks    Status  New    Target Date  08/22/18            Plan - 06/23/18 1526    Clinical Impression Statement  Extra time required to assess patient's pain today.  Patient had pain with every exercise and every position.  Pain prior to cold pack was 7/10 in neck.  She was encouraged to keep exercises as comfortable as possible.  She is motivated to make changes even if they  are painful.  She requires moderate cues for current home exercise program. I did not advance current HEP due to not finding something she could tolerate.     PT Next Visit Plan  gross periscap & lumbopelvic strengthening to HEP      PT Home Exercise Plan  scapular retraction, cervical retraction, upper trap stretch, neutral alignment in mirror    Consulted and Agree with Plan of Care  Patient       Patient will benefit from skilled therapeutic intervention in order to improve the following deficits and impairments:     Visit Diagnosis: Cervicalgia  Chronic left-sided low back pain, with sciatica presence unspecified  Muscle weakness (generalized)  Cramp and spasm  Abnormal posture     Problem List Patient Active Problem List   Diagnosis Date Noted  . Somatic complaints, multiple 03/14/2017  . Chronic pain syndrome 08/25/2014  . Irritable bowel  syndrome (IBS) 01/12/2014  . 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[place]) 01/11/2014  . Chronic abdominal pain 01/11/2014  . Chronic venous insufficiency 10/30/2013  . Varicose veins 10/30/2013  . Chest tightness 03/25/2012  . 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 RECTAL BLEEDING 08/26/2008  . UTI'S, RECURRENT 08/26/2008    Shahab Polhamus PTA 06/23/2018, 3:33 PM  [facility] [address] [place], [place], [address] Phone: [phone]   Fax:  [phone]  Name: Virginia Shaw MRN: [id number] Date  of Birth: Feb 08, 1966

## 2018-06-24 ENCOUNTER — Ambulatory Visit: Payer: Medicaid Other | Admitting: Physical Therapy

## 2018-06-25 ENCOUNTER — Encounter

## 2018-06-26 ENCOUNTER — Ambulatory Visit: Payer: Medicaid Other | Admitting: Physical Therapy

## 2018-06-26 ENCOUNTER — Encounter: Payer: Self-pay | Admitting: Physical Therapy

## 2018-06-26 DIAGNOSIS — M545 Low back pain: Secondary | ICD-10-CM

## 2018-06-26 DIAGNOSIS — R293 Abnormal posture: Secondary | ICD-10-CM

## 2018-06-26 DIAGNOSIS — M542 Cervicalgia: Secondary | ICD-10-CM | POA: Diagnosis not present

## 2018-06-26 DIAGNOSIS — G8929 Other chronic pain: Secondary | ICD-10-CM

## 2018-06-26 DIAGNOSIS — M6281 Muscle weakness (generalized): Secondary | ICD-10-CM

## 2018-06-26 DIAGNOSIS — R252 Cramp and spasm: Secondary | ICD-10-CM

## 2018-06-26 NOTE — Therapy (Signed)
[place], [place], [address] Phone: [phone]   Fax:  [phone]  Physical Therapy Treatment  Patient Details  Name: Virginia Shaw MRN: [id number] Date of Birth: 08/04/1966 Referring Provider: [name], MD   Encounter Date: 06/26/2018  PT End of Session - 06/26/18 1405    Visit Number  3    Number of Visits  4    Authorization Type  approved 3 visits tx through 07/05/18    PT Start Time  1406    PT Stop Time  1510    PT Time Calculation (min)  64 min    Activity Tolerance  Patient limited by pain       Past Medical History:  Diagnosis Date  . Abnormal involuntary movements(781.0)   . Allergic rhinitis, cause unspecified   . Anxiety state, unspecified   . Cervical spondylosis without myelopathy   . Disturbance of skin sensation   . Disturbance of skin sensation   . GERD (gastroesophageal reflux disease)   . History of chicken pox   . Irritable bowel syndrome   . Lumbago   . Myalgia and myositis, unspecified   . Osteoporosis   . Other and unspecified angina pectoris   . Other malaise and fatigue   . 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 Somatization disorder   . Spasm of muscle   . Systemic sclerosis ([place])   . Unspecified hereditary and idiopathic peripheral neuropathy     Past Surgical History:  Procedure Laterality Date  . ABDOMINAL HYSTERECTOMY  1997  . arnold chiari malformation  2013  . BACK SURGERY  2001, 2011,2013   Lumbar x 2  . BLADDER SURGERY  2011  . CERVICAL SPINE SURGERY  2013  . CHOLECYSTECTOMY    . COLONOSCOPY  2009   "Normal" Dr Sharlett Iles  . COLONOSCOPY  2002   "Normal" Dr Freddrick March  . COLONOSCOPY  ~2013   Dr Ferdinand Lango.  "Chest pain with the prep" = aborted  . CRANIECTOMY SUBOCCIPITAL W/ CERVICAL LAMINECTOMY / CHIARI  2012   Dr Annette Stable, Chiari decompression with suboccipital craniectomy and  . CYSTOCELE REPAIR  2002   Dr Gaetano Net  . OOPHORECTOMY  1999, 2000   . PITUITARY EXCISION  2010   pituitary cyst removed  . RECTOCELE REPAIR  2002   Dr. Gaetano Net  . tear duct tubes  2005  . TUBAL LIGATION      There were no vitals filed for this visit.  Subjective Assessment - 06/26/18 1407    Subjective  Pt reports she is very sore after her last tx, and still lasting till today. The pain is in the Lt low back into the groin and leg and the right side started today. Her neck and Lt eye pain is really bad.     Patient Stated Goals  decrease pain, improve function    Currently in Pain?  No/denies                       [facility] Adult PT Treatment/Exercise - 06/26/18 0001      Exercises   Exercises  Lumbar;Neck      Neck Exercises: Seated   Neck Retraction  5 reps   stopped due to increased pain   Neck Retraction Limitations  performed head nods VC for form    Shoulder Rolls  Backwards;10 reps   VC to stay relaxed   Other Seated Exercise  scapular retraction then with shoulder ER  respecting pain      Lumbar Exercises: Seated   Other Seated Lumbar Exercises  attempted seated pelvic rocks - significant pain in whole body.     Other Seated Lumbar Exercises  performed seated pelvic floor contractions with breathing, VC to relax upper body when performing.       Lumbar Exercises: Supine   Other Supine Lumbar Exercises  decompession position with legs elevated, pt in a lot of discomfort through her whole body with this.       Modalities   Modalities  Electrical Stimulation   pt declined ice and heat     Electrical Stimulation   Electrical Stimulation Location  cervical and lumbar    Electrical Stimulation Action  premod    Electrical Stimulation Parameters  seated to tolerance   stopped lumbar after 4 min d/t increased Lt LE/groin pain   Electrical Stimulation Goals  Pain;Tone      Manual Therapy   Manual Therapy  Soft tissue mobilization;Manual Traction    Soft tissue mobilization  gentle STM to bilat SCM, Rt very tight  proximally and Lt distally, also very tight around her occiput.  Low tolerance to manual work d/t reports of numbness and pain into her face.     Manual Traction  very gentle cervical traction               PT Short Term Goals - 06/16/18 1741      PT SHORT TERM GOAL #1   Title  Pt will demo proper postural alignment with minimal cues required    Baseline  heavy cuing along biomechanical chain at eval    Time  3   time to accommodate for MCD authorization time   Period  Weeks    Status  New    Target Date  08/04/18      PT SHORT TERM GOAL #2   Title  Pt will verbalize independence with HEP as it has been established in the short term    Baseline  began creating at eval    Time  3    Period  Weeks    Status  New    Target Date  08/04/18      PT SHORT TERM GOAL #3   Title  Pt will be able to sit comfortably for at least 10 min    Baseline  5 min at eval    Time  3    Period  Weeks    Status  New    Target Date  08/04/18        PT Long Term Goals - 06/16/18 1743      PT LONG TERM GOAL #1   Title  Pt will be able to complete household chores pain <=4/10    Baseline  severe and limiting at eval    Time  9    Period  Weeks    Status  New    Target Date  08/22/18      PT LONG TERM GOAL #2   Title  Pt will be able to sit and be supine without numbness in UE or LE    Baseline  N/t begins after 5 min at eval    Time  9    Period  Weeks    Status  New    Target Date  08/22/18      PT LONG TERM GOAL #3   Title  Pt will be able to sleep without limitation by pain  Baseline  woken multiple times at night at eval    Time  9    Period  Weeks    Status  New    Target Date  08/22/18      PT LONG TERM GOAL #4   Title  Resolution of HA/jaw pain referred from Lt shoulder    Baseline  feels the pain shooting from shoulder to head/eye    Time  9    Period  Weeks    Status  New    Target Date  08/22/18            Plan - 06/26/18 1406    Clinical  Impression Statement  Pt has very limited tolerance to all positions, movement, exercise.  HEP was modified to try and find something was more comfortable for her to do, this was very difficult.  She only tolerated stim to the lumbar region for 4' before reporting Lt LE and groin pain increase and tolerated it on her neck for about 28' before reporting that the Lt side of her neck was going numb.  She called one hour after treatment reporting a lot of pain, recommended she use ice on her hip/low back and heat on her neck.  Pt really wants to try cerivcal traction as she said it helped when she saw a chiropracter.  She wasn't able to tolerate gentle manual traction and STM to her neck so reported she most likely would not be able to tolerate mechanical traction.  She is very tight through bilat SCM, and periocciptal muscles.     Rehab Potential  Good    PT Treatment/Interventions  ADLs/Self Care Home Management;Cryotherapy;Electrical Stimulation;Iontophoresis 4mg /ml Dexamethasone;Moist Heat;Therapeutic exercise;Therapeutic activities;Functional mobility training;Stair training;Gait training;Ultrasound;Neuromuscular re-education;Patient/family education;Manual techniques;Taping;Dry needling;Passive range of motion    PT Next Visit Plan  will need to ask for more visits from MCD if beneficial    Consulted and Agree with Plan of Care  Patient       Patient will benefit from skilled therapeutic intervention in order to improve the following deficits and impairments:  Decreased endurance, Impaired sensation, Decreased activity tolerance, Decreased strength, Impaired UE functional use, Pain, Increased muscle spasms, Difficulty walking, Decreased mobility, Improper body mechanics, Decreased range of motion, Impaired flexibility, Postural dysfunction  Visit Diagnosis: Cervicalgia  Chronic left-sided low back pain, with sciatica presence unspecified  Muscle weakness (generalized)  Cramp and spasm  Abnormal  posture     Problem List Patient Active Problem List   Diagnosis Date Noted  . Somatic complaints, multiple 03/14/2017  . Chronic pain syndrome 08/25/2014  . Irritable bowel syndrome (IBS) 01/12/2014  . 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) 01/11/2014  . Chronic abdominal pain 01/11/2014  . Chronic venous insufficiency 10/30/2013  . Varicose veins 10/30/2013  . Chest tightness 03/25/2012  . 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 RECTAL BLEEDING 08/26/2008  . UTI'S, RECURRENT 08/26/2008    Jeral Pinch PT  06/26/2018, 4:13 PM  [facility] [address] [place], [place], [address] Phone: [phone]   Fax:  [phone]  Name: Virginia Shaw MRN: [id number] Date of Birth: [birth year]

## 2018-06-26 NOTE — Patient Instructions (Addendum)
TENS UNIT: This is helpful for muscle pain and spasm.   Search and Purchase a TENS 7000 2nd edition at www.tenspros.com. Amazon prime also It should be less than $30.     TENS unit instructions: Do not shower or bathe with the unit on Turn the unit off before removing electrodes or batteries If the electrodes lose stickiness add a drop of water to the electrodes after they are disconnected from the unit and place on plastic sheet. If you continued to have difficulty, call the TENS unit company to purchase more electrodes. Do not apply lotion on the skin area prior to use. Make sure the skin is clean and dry as this will help prolong the life of the electrodes. After use, always check skin for unusual red areas, rash or other skin difficulties. If there are any skin problems, does not apply electrodes to the same area. Never remove the electrodes from the unit by pulling the wires. Do not use the TENS unit or electrodes other than as directed. Do not change electrode placement without consultating your therapist or physician. Keep 2 fingers with between each electrode. Wear time ratio is 2:1, on to off times.    For example on for 30 minutes off for 15 minutes and then on for 30 minutes off for 15 minutes

## 2018-07-01 ENCOUNTER — Ambulatory Visit: Payer: Medicaid Other | Admitting: Physical Therapy

## 2018-07-02 ENCOUNTER — Ambulatory Visit: Payer: Medicaid Other | Admitting: Physical Therapy

## 2018-07-02 ENCOUNTER — Encounter: Payer: Self-pay | Admitting: Physical Therapy

## 2018-07-02 DIAGNOSIS — M542 Cervicalgia: Secondary | ICD-10-CM | POA: Diagnosis not present

## 2018-07-02 DIAGNOSIS — M6281 Muscle weakness (generalized): Secondary | ICD-10-CM

## 2018-07-02 DIAGNOSIS — R252 Cramp and spasm: Secondary | ICD-10-CM

## 2018-07-02 DIAGNOSIS — R293 Abnormal posture: Secondary | ICD-10-CM

## 2018-07-02 DIAGNOSIS — M545 Low back pain: Secondary | ICD-10-CM

## 2018-07-02 DIAGNOSIS — G8929 Other chronic pain: Secondary | ICD-10-CM

## 2018-07-02 NOTE — Therapy (Signed)
Virginia Shaw, [place], [address] Phone: [phone]   Fax:  [phone]  Physical Therapy Treatment  Patient Details  Name: Virginia Shaw MRN: [id number] Date of Birth: 1966-05-20 Referring Provider: [name] MD   Encounter Date: 07/02/2018  PT End of Session - 07/02/18 0810    Visit Number  4    Number of Visits  10    Date for PT Re-Evaluation  08/04/18    Authorization Type  requesting 6 more visits, cont once a week to allow her body to recover between tx.  07/02/18   PT Start Time  0810    PT Stop Time  0852    PT Time Calculation (min)  42 min    Activity Tolerance  Patient limited by pain    Behavior During Therapy  Anxious       Past Medical History:  Diagnosis Date  . Abnormal involuntary movements(781.0)   . Allergic rhinitis, cause unspecified   . Anxiety state, unspecified   . Cervical spondylosis without myelopathy   . Disturbance of skin sensation   . Disturbance of skin sensation   . GERD (gastroesophageal reflux disease)   . History of chicken pox   . Irritable bowel syndrome   . Lumbago   . Myalgia and myositis, unspecified   . Osteoporosis   . Other and unspecified angina pectoris   . Other malaise and fatigue   . 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[place]) 01/11/2014  . Somatization disorder   . Spasm of muscle   . Systemic sclerosis ([place])   . Unspecified hereditary and idiopathic peripheral neuropathy     Past Surgical History:  Procedure Laterality Date  . ABDOMINAL HYSTERECTOMY  1997  . arnold chiari malformation  2013  . BACK SURGERY  2001, 2011,2013   Lumbar x 2  . BLADDER SURGERY  2011  . CERVICAL SPINE SURGERY  2013  . CHOLECYSTECTOMY    . COLONOSCOPY  2009   "Normal" Dr Sharlett Iles  . COLONOSCOPY  2002   "Normal" Dr Freddrick March  . COLONOSCOPY  ~2013   Dr Ferdinand Lango.  "Chest pain with the prep" = aborted  . CRANIECTOMY SUBOCCIPITAL W/ CERVICAL LAMINECTOMY / CHIARI  2012   Dr  Annette Stable, Chiari decompression with suboccipital craniectomy and  . CYSTOCELE REPAIR  2002   Dr Gaetano Net  . OOPHORECTOMY  1999, 2000  . PITUITARY EXCISION  2010   pituitary cyst removed  . RECTOCELE REPAIR  2002   Dr. Gaetano Net  . tear duct tubes  2005  . TUBAL LIGATION      There were no vitals filed for this visit.  Subjective Assessment - 07/02/18 0813    Subjective  Pt reports that the muscles in her neck feel some better, the has nerve pain in the Lt neck into the eye and Lt hip and leg into the calf.  It woke her up early this morning. The estim did not help her at all    Patient Stated Goals  decrease pain, improve function    Currently in Pain?  Yes    Pain Score  9     Pain Location  Back   and neck, eye and leg   Pain Orientation  Left    Pain Descriptors / Indicators  Aching;Shooting;Sharp;Numbness;Tingling    Pain Type  Chronic pain    Pain Onset  More than a month ago    Pain Frequency  Constant    Aggravating Factors  not sure, everything    Pain Relieving Factors  nothing in particular, baby aspirin helps some.          [facility] PT Assessment - 07/02/18 0001      Assessment   Medical Diagnosis  Neck & low back pain    Referring Provider  [name] MD                   [facility] Adult PT Treatment/Exercise - 07/02/18 0001      Self-Care   Posture  self trigger point release to muscles in buttocks and Lt hip flexors      Shoulder Exercises: Supine   Other Supine Exercises  gentle scapular retraction around half bolster.       Manual Therapy   Manual Therapy  Joint mobilization;Soft tissue mobilization;Myofascial release    Joint Mobilization  grade II mobs sacrum and lower lumbar     Soft tissue mobilization  STM with TPR Lt gluts, piriformis, wowrked on Bilat SCM, Rt side tigher tan Lt, base of occiput, Lt side tigher than Rt     Myofascial Release  fascial releases to sacroiliac ligament bilat                PT Short Term Goals -  07/02/18 0820      PT SHORT TERM GOAL #1   Title  Pt will demo proper postural alignment with minimal cues required    Baseline  requires min cues, it helps the neck however not low back    Period  Weeks    Status  Achieved      PT SHORT TERM GOAL #2   Title  Pt will verbalize independence with HEP as it has been established in the short term    Baseline  has started some, minimal tolerance so the program has been continually modified    Time  4    Period  Weeks    Status  Partially Met    Target Date  08/04/18      PT SHORT TERM GOAL #3   Title  Pt will be able to sit comfortably for at least 10 min    Baseline  between 5-10 min, usually on the shorter side, on a hard surface, 20-30 on a soft surface    Time  4    Period  Weeks    Status  Partially Met    Target Date  08/04/18        PT Long Term Goals - 07/02/18 0824      PT LONG TERM GOAL #1   Title  Pt will be able to complete household chores pain <=4/10    Baseline  severe and limiting at this time    Time  6    Status  On-going    Target Date  08/13/18      PT LONG TERM GOAL #2   Title  Pt will be able to sit and be supine without numbness in UE or LE    Baseline  N/t begins after 5 min at eval    Time  6    Period  Weeks    Status  On-going    Target Date  08/13/18      PT LONG TERM GOAL #3   Title  Pt will be able to sleep without limitation by pain    Baseline  woken multiple times at night at eval    Time  6    Period  Weeks    Status  On-going    Target Date  08/13/18      PT LONG TERM GOAL #4   Title  Resolution of HA/jaw pain referred from Lt shoulder    Baseline  feels the pain shooting from shoulder to head/eye, now with numbness and some dizziness    Time  6    Period  Weeks    Status  On-going    Target Date  08/13/18            Plan - 07/02/18 0855    Clinical Impression Statement  [place] has made very small improvements.  She continues with significant pain, mainly Lt side of  her body from head to calf, also in Rt forearm and wrist.  Progress is anticipated to be very slow as she had multiple medical issues and significant pain limitations.  She has a lot of muscular tightness, guarding and fear of moving.  She does not tolerate modalities for pain well. Over the last two visits she has had increased pain after the treatments and then a couple days later some relief.  Her HEP has been modified to slowly ease her into exercise while gainng her confidence that she can do it. She would beneift from continued treatment to slowly work on decreasing her pain and restoring function.      Rehab Potential  Good    PT Frequency  1x / week    PT Duration  6 weeks    PT Treatment/Interventions  ADLs/Self Care Home Management;Cryotherapy;Electrical Stimulation;Iontophoresis 26m/ml Dexamethasone;Moist Heat;Therapeutic exercise;Therapeutic activities;Functional mobility training;Stair training;Gait training;Ultrasound;Neuromuscular re-education;Patient/family education;Manual techniques;Taping;Dry needling;Passive range of motion    PT Next Visit Plan  cont with manual work, it is very painful for pt however it is helping, slowly increase exercise.  More visits have been requested from MCD    Consulted and Agree with Plan of Care  Patient       Patient will benefit from skilled therapeutic intervention in order to improve the following deficits and impairments:  Decreased endurance, Impaired sensation, Decreased activity tolerance, Decreased strength, Impaired UE functional use, Pain, Increased muscle spasms, Difficulty walking, Decreased mobility, Improper body mechanics, Decreased range of motion, Impaired flexibility, Postural dysfunction  Visit Diagnosis: Cervicalgia  Chronic left-sided low back pain, with sciatica presence unspecified  Muscle weakness (generalized)  Cramp and spasm  Abnormal posture     Problem List Patient Active Problem List   Diagnosis Date Noted  .  Somatic complaints, multiple 03/14/2017  . Chronic pain syndrome 08/25/2014  . Irritable bowel syndrome (IBS) 01/12/2014  . 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 01[place]  . Chronic abdominal pain 01/11/2014  . Chronic venous insufficiency 10/30/2013  . Varicose veins 10/30/2013  . Chest tightness 03/25/2012  . 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 RECTAL BLEEDING 08/26/2008  . UTI'S, RECURRENT 08/26/2008    Righteous [place],SUE 07/02/2018, 12:41 PM  [facility] [address]Phone: [phone]  Fax:  [phone] Name: DMIKALYN HERMIDAMRN: [id number] of Birth: 1Nov 10, 1967

## 2018-07-08 ENCOUNTER — Encounter: Payer: Self-pay | Admitting: Physical Therapy

## 2018-07-08 ENCOUNTER — Ambulatory Visit: Payer: Medicaid Other | Attending: Family Medicine | Admitting: Physical Therapy

## 2018-07-08 DIAGNOSIS — M542 Cervicalgia: Secondary | ICD-10-CM | POA: Insufficient documentation

## 2018-07-08 DIAGNOSIS — R252 Cramp and spasm: Secondary | ICD-10-CM | POA: Diagnosis present

## 2018-07-08 DIAGNOSIS — G8929 Other chronic pain: Secondary | ICD-10-CM | POA: Insufficient documentation

## 2018-07-08 DIAGNOSIS — R293 Abnormal posture: Secondary | ICD-10-CM | POA: Diagnosis present

## 2018-07-08 DIAGNOSIS — M545 Low back pain: Secondary | ICD-10-CM | POA: Insufficient documentation

## 2018-07-08 DIAGNOSIS — M6281 Muscle weakness (generalized): Secondary | ICD-10-CM | POA: Insufficient documentation

## 2018-07-08 NOTE — Therapy (Addendum)
[place], [place], [address] Phone: [phone]   Fax:  [phone]  Physical Therapy Treatment/Discharge Summary  Patient Details  Name: Virginia Shaw MRN: [id number] Date of Birth: Jan 02, 1966 Referring Provider: [name] MD   Encounter Date: 07/08/2018  PT End of Session - 07/08/18 1628    Visit Number  5    Number of Visits  10    Date for PT Re-Evaluation  08/04/18    Authorization Type  requesting 6 more visits, cont once a week to allow her body to recover between tx.     PT Start Time  1627    PT Stop Time  1710    PT Time Calculation (min)  43 min    Activity Tolerance  Patient limited by pain    Behavior During Therapy  Anxious       Past Medical History:  Diagnosis Date  . Abnormal involuntary movements(781.0)   . Allergic rhinitis, cause unspecified   . Anxiety state, unspecified   . Cervical spondylosis without myelopathy   . Disturbance of skin sensation   . Disturbance of skin sensation   . GERD (gastroesophageal reflux disease)   . History of chicken pox   . Irritable bowel syndrome   . Lumbago   . Myalgia and myositis, unspecified   . Osteoporosis   . Other and unspecified angina pectoris   . Other malaise and fatigue   . 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[place]) 01/11/2014  . Somatization disorder   . Spasm of muscle   . Systemic sclerosis ([place])   . Unspecified hereditary and idiopathic peripheral neuropathy     Past Surgical History:  Procedure Laterality Date  . ABDOMINAL HYSTERECTOMY  1997  . arnold chiari malformation  2013  . BACK SURGERY  2001, 2011,2013   Lumbar x 2  . BLADDER SURGERY  2011  . CERVICAL SPINE SURGERY  2013  . CHOLECYSTECTOMY    . COLONOSCOPY  2009   "Normal" Dr Sharlett Iles  . COLONOSCOPY  2002   "Normal" Dr Freddrick March  . COLONOSCOPY  ~2013   Dr Ferdinand Lango.  "Chest pain with the prep" = aborted  . CRANIECTOMY SUBOCCIPITAL W/ CERVICAL LAMINECTOMY / CHIARI   2012   Dr Annette Stable, Chiari decompression with suboccipital craniectomy and  . CYSTOCELE REPAIR  2002   Dr Gaetano Net  . OOPHORECTOMY  1999, 2000  . PITUITARY EXCISION  2010   pituitary cyst removed  . RECTOCELE REPAIR  2002   Dr. Gaetano Net  . tear duct tubes  2005  . TUBAL LIGATION      There were no vitals filed for this visit.  Subjective Assessment - 07/08/18 1629    Subjective  My neck doesn't feel as tight, still a lot of numbness and pain in the side of my face. I do not want to do any more on my lower back. I want to try it one more time because I am in so much pain today. I had to sit a lot today.     Patient Stated Goals  decrease pain, improve function    Currently in Pain?  Yes    Pain Score  8     Pain Location  --   back & neck                      [facility] Adult PT Treatment/Exercise - 07/08/18 0001      Exercises   Exercises  Neck  Pilates   Pilates Reformer  3      Neck Exercises: Supine   Neck Retraction Limitations  gentle in hooklying with arms by sides    Other Supine Exercise  hooklying breathing    Other Supine Exercise  hooklying scapular retraction      Manual Therapy   Soft tissue mobilization  STM bil upper traps             PT Education - 07/08/18 1713    Education Details  pain science, posture, link of cervical & lumbar spine, POC, HEP, exercise form/rationale    Person(s) Educated  Patient    Methods  Explanation;Handout    Comprehension  Verbalized understanding;Need further instruction       PT Short Term Goals - 07/02/18 0820      PT SHORT TERM GOAL #1   Title  Pt will demo proper postural alignment with minimal cues required    Baseline  requires min cues, it helps the neck however not low back    Period  Weeks    Status  Achieved      PT SHORT TERM GOAL #2   Title  Pt will verbalize independence with HEP as it has been established in the short term    Baseline  has started some, minimal tolerance so the  program has been continually modified    Time  4    Period  Weeks    Status  Partially Met    Target Date  08/04/18      PT SHORT TERM GOAL #3   Title  Pt will be able to sit comfortably for at least 10 min    Baseline  between 5-10 min, usually on the shorter side, on a hard surface, 20-30 on a soft surface    Time  4    Period  Weeks    Status  Partially Met    Target Date  08/04/18        PT Long Term Goals - 07/02/18 0824      PT LONG TERM GOAL #1   Title  Pt will be able to complete household chores pain <=4/10    Baseline  severe and limiting at this time    Time  6    Status  On-going    Target Date  08/13/18      PT LONG TERM GOAL #2   Title  Pt will be able to sit and be supine without numbness in UE or LE    Baseline  N/t begins after 5 min at eval    Time  6    Period  Weeks    Status  On-going    Target Date  08/13/18      PT LONG TERM GOAL #3   Title  Pt will be able to sleep without limitation by pain    Baseline  woken multiple times at night at eval    Time  6    Period  Weeks    Status  On-going    Target Date  08/13/18      PT LONG TERM GOAL #4   Title  Resolution of HA/jaw pain referred from Lt shoulder    Baseline  feels the pain shooting from shoulder to head/eye, now with numbness and some dizziness    Time  6    Period  Weeks    Status  On-going    Target Date  08/13/18  Plan - 07/08/18 1710    Clinical Impression Statement  Heavy focus on education today due to reprots of multiple pains. Trigger points noted in upper traps recreated tingling behind her eye. She asked if it could do with the scar tissue from surgery which I recommended she discuss this with her surgeon. Pt asked to change appts to 1.week to fit POC and will call after this appointment to cancel if she does not feel that PT is helpful.     PT Treatment/Interventions  ADLs/Self Care Home Management;Cryotherapy;Electrical Stimulation;Iontophoresis '4mg'$ /ml  Dexamethasone;Moist Heat;Therapeutic exercise;Therapeutic activities;Functional mobility training;Stair training;Gait training;Ultrasound;Neuromuscular re-education;Patient/family education;Manual techniques;Taping;Dry needling;Passive range of motion    PT Next Visit Plan  manual work as tolerated, chest stretch with periscap strength. LE post chain stretching    PT Home Exercise Plan  scapular retraction, cervical retraction, upper trap stretch, neutral alignment in mirror; decompression series 1-3    Consulted and Agree with Plan of Care  Patient       Patient will benefit from skilled therapeutic intervention in order to improve the following deficits and impairments:  Decreased endurance, Impaired sensation, Decreased activity tolerance, Decreased strength, Impaired UE functional use, Pain, Increased muscle spasms, Difficulty walking, Decreased mobility, Improper body mechanics, Decreased range of motion, Impaired flexibility, Postural dysfunction  Visit Diagnosis: Cervicalgia  Chronic left-sided low back pain, with sciatica presence unspecified  Muscle weakness (generalized)  Cramp and spasm  Abnormal posture     Problem List Patient Active Problem List   Diagnosis Date Noted  . Somatic complaints, multiple 03/14/2017  . Chronic pain syndrome 08/25/2014  . Irritable bowel syndrome (IBS) 01/12/2014  . 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[place]) 01/11/2014  . Chronic abdominal pain 01/11/2014  . Chronic venous insufficiency 10/30/2013  . Varicose veins 10/30/2013  . Chest tightness 03/25/2012  . 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 RECTAL BLEEDING 08/26/2008  . UTI'S, RECURRENT 08/26/2008    Lacorey Brusca C. Link Burgeson PT, DPT 07/08/18 5:14 PM   [facility] [address] [place], [place], [address] Phone: [phone]   Fax:  [phone]  Name: Virginia Shaw MRN: [id number] Date of Birth: Dec 29, 1965  PHYSICAL THERAPY DISCHARGE  SUMMARY  Visits from Start of Care: 5  Current functional level related to goals / functional outcomes: See above   Remaining deficits: See above   Education / Equipment: Anatomy of condition, POC, HEP, exercise form/rationale  Plan: Patient agrees to discharge.  Patient goals were not met. Patient is being discharged due to the patient's request.  ?????    Does not feel that PT is appropriate for her, requested cancel of appointments.   Nemesis Rainwater C. Jule Whitsel PT, DPT 07/11/18 9:31 AM

## 2018-07-10 ENCOUNTER — Ambulatory Visit: Payer: Medicaid Other | Admitting: Physical Therapy

## 2018-07-14 ENCOUNTER — Ambulatory Visit: Payer: Medicaid Other | Admitting: Physical Therapy

## 2018-07-21 ENCOUNTER — Ambulatory Visit: Payer: Medicaid Other | Admitting: Physical Therapy

## 2018-07-24 ENCOUNTER — Encounter: Payer: Medicaid Other | Admitting: Physical Therapy

## 2018-07-24 ENCOUNTER — Ambulatory Visit
Admission: RE | Admit: 2018-07-24 | Discharge: 2018-07-24 | Disposition: A | Discharge status: Home / Self Care | Payer: Medicaid Other | Source: Ambulatory Visit | Attending: Family Medicine | Admitting: Family Medicine

## 2018-07-24 ENCOUNTER — Encounter: Payer: Self-pay | Admitting: Rheumatology

## 2018-07-24 ENCOUNTER — Other Ambulatory Visit: Payer: Self-pay | Admitting: Family Medicine

## 2018-07-24 DIAGNOSIS — R042 Hemoptysis: Secondary | ICD-10-CM

## 2018-07-28 ENCOUNTER — Ambulatory Visit: Payer: Medicaid Other | Admitting: Physical Therapy

## 2018-07-31 NOTE — Progress Notes (Signed)
Office Visit Note  Patient: Virginia Shaw             Date of Birth: [birth year]           MRN: [id number]             PCP: [name], MD Referring: [name], MD Visit Date: 08/11/2018 Occupation: Unemployed  Subjective: Pain in multiple joints and muscles   History of Present Illness: Virginia Shaw is a 52 y.o. female seen in consultation per request of her PCP.  According to patient her symptoms are started in early 2000 with pain all over her body.  She states the pain was more prominent on the left side than her right side.  She started having pain and discomfort in her bilateral hands and wrist joints to the point that she was having difficulty gripping objects.  She underwent C-spine surgery and fusion.  She also has lower back pain and lower extremity pain.  She has had lumbar spine discectomy 3 times.  She has another appointment coming up with neurosurgeon.  She states she has been through physical therapy which is helpful to some extent.  She also experiences pulling in her rib cage.  She is unable to sit stand or walk for long time due to discomfort.  She has appointment coming up with the pain management as well.  He notices joint swelling in her hands and feet.  Patient gives positive family history of systemic lupus and dermatomyositis in her uncles and aunts.  Activities of Daily Living:  Patient reports morning stiffness for 0 none.   Patient Reports nocturnal pain.  Difficulty dressing/grooming: Reports Difficulty climbing stairs: Reports Difficulty getting out of chair: Reports Difficulty using hands for taps, buttons, cutlery, and/or writing: Reports  Review of Systems  Constitutional: Positive for fatigue. Negative for night sweats, weight gain and weight loss.  HENT: Positive for mouth dryness and nose dryness. Negative for mouth sores, trouble swallowing and trouble swallowing.   Eyes: Positive for dryness. Negative for pain, redness and visual  disturbance.  Respiratory: Negative for cough and difficulty breathing.   Cardiovascular: Positive for swelling in legs/feet. Negative for chest pain, palpitations, hypertension and irregular heartbeat.  Gastrointestinal: Positive for constipation. Negative for blood in stool and diarrhea.  Endocrine: Positive for cold intolerance and increased urination. Negative for heat intolerance.  Genitourinary: Positive for difficulty urinating. Negative for involuntary urination and vaginal dryness.  Musculoskeletal: Positive for arthralgias, gait problem, joint pain, joint swelling, muscle weakness and muscle tenderness. Negative for myalgias, morning stiffness and myalgias.  Skin: Negative for color change, rash, hair loss, skin tightness, ulcers and sensitivity to sunlight.  Allergic/Immunologic: Negative for susceptible to infections.  Neurological: Positive for numbness. Negative for dizziness, memory loss, night sweats and weakness.  Hematological: Positive for bruising/bleeding tendency. Negative for swollen glands.  Psychiatric/Behavioral: Positive for sleep disturbance. Negative for depressed mood. The patient is not nervous/anxious.     PMFS History:  Patient Active Problem List   Diagnosis Date Noted  . DDD (degenerative disc disease), cervical 08/11/2018  . DDD (degenerative disc disease), lumbar 08/11/2018  . Somatic complaints, multiple 03/14/2017  . Chronic pain syndrome 08/25/2014  . Irritable bowel syndrome (IBS) 01/12/2014  . 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[place]) 01/11/2014  . Chronic abdominal pain 01/11/2014  . Chronic venous insufficiency 10/30/2013  . Varicose veins 10/30/2013  . Chest tightness 03/25/2012  . 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 RECTAL BLEEDING 08/26/2008  . UTI'S, RECURRENT 08/26/2008  Past Medical History:  Diagnosis Date  . Abnormal involuntary movements(781.0)   . Allergic rhinitis, cause unspecified   . Anxiety state, unspecified   . Cervical  spondylosis without myelopathy   . Disturbance of skin sensation   . Disturbance of skin sensation   . GERD (gastroesophageal reflux disease)   . History of chicken pox   . Irritable bowel syndrome   . Lumbago   . Myalgia and myositis, unspecified   . Osteoporosis   . Other and unspecified angina pectoris   . Other malaise and fatigue   . 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[place]) 01/11/2014  . Somatization disorder   . Spasm of muscle   . Systemic sclerosis ([place])   . Unspecified hereditary and idiopathic peripheral neuropathy     Family History  Problem Relation Age of Onset  . Hypertension Father   . Hyperlipidemia Father   . Diabetes Father   . Stroke Father   . Cancer Maternal Uncle        lung  . Cancer Paternal Aunt        lung  . Migraines Mother   . Heart attack Paternal Grandmother   . Heart attack Paternal Grandfather   . Hypertension Paternal Grandfather   . Migraines Sister    Past Surgical History:  Procedure Laterality Date  . ABDOMINAL HYSTERECTOMY  1997  . arnold chiari malformation  2013  . BACK SURGERY  2001, 2011,2013   Lumbar x 2  . BLADDER SURGERY  2011  . CARDIAC CATHETERIZATION    . CATARACT EXTRACTION    . CERVICAL SPINE SURGERY  2013  . CHOLECYSTECTOMY    . COLONOSCOPY  2009   "Normal" Dr Sharlett Iles  . COLONOSCOPY  2002   "Normal" Dr Freddrick March  . COLONOSCOPY  ~2013   Dr Ferdinand Lango.  "Chest pain with the prep" = aborted  . CRANIECTOMY SUBOCCIPITAL W/ CERVICAL LAMINECTOMY / CHIARI  2012   Dr Annette Stable, Chiari decompression with suboccipital craniectomy and  . CYSTOCELE REPAIR  2002   Dr Gaetano Net  . OOPHORECTOMY  1999, 2000  . PITUITARY EXCISION  2010   pituitary cyst removed  . RECTOCELE REPAIR  2002   Dr. Gaetano Net  . tear duct tubes  2005  . TUBAL LIGATION     Social History   Social History Narrative   Lives at home with children. ( 2 daughters)   Regular exercise-yes   Caffeine Use-no   GED   Not Working outside home    Objective: Vital Signs: BP  (!) 90/55 (BP Location: Right Arm, Patient Position: Sitting, Cuff Size: Normal)   Pulse 73   Resp 14   Ht 5' 1.75" (1.568 m)   Wt 124 lb 9.6 oz (56.5 kg)   BMI 22.97 kg/m    Physical Exam  Constitutional: She is oriented to person, place, and time. She appears well-developed and well-nourished.  HENT:  Head: Normocephalic and atraumatic.  Eyes: Conjunctivae and EOM are normal.  Neck: Normal range of motion.  Cardiovascular: Normal rate, regular rhythm, normal heart sounds and intact distal pulses.  Pulmonary/Chest: Effort normal and breath sounds normal.  Abdominal: Soft. Bowel sounds are normal.  Lymphadenopathy:    She has no cervical adenopathy.  Neurological: She is alert and oriented to person, place, and time.  Skin: Skin is warm and dry. Capillary refill takes less than 2 seconds.  Psychiatric: She has a normal mood and affect. Her behavior is normal.  Nursing note and vitals reviewed.    Musculoskeletal  Exam: Patient has limited range of motion of her cervical spine due to's fusion.  Shoulder joints elbow joints are good range of motion.  Wrist joints MCPs and PIPs were in good range of motion.  She has DIP thickening with no synovitis.  She had painful range of motion of her left hip joint.  Knee joints ankle joints MTPs PIPs were in good range of motion with no synovitis.  She has positive tender points and hyperalgesia.  CDAI Exam: CDAI Score: Not documented Patient Global Assessment: Not documented; Provider Global Assessment: Not documented Swollen: Not documented; Tender: Not documented Joint Exam   Not documented   There is currently no information documented on the homunculus. Go to the Rheumatology activity and complete the homunculus joint exam.  Investigation: Findings:  July 24, 2018 RF negative, CRP normal, ESR 7, CMP normal, UA negative   Imaging: Dg Chest 2 View  Result Date: 07/25/2018 CLINICAL DATA:  Hemoptysis. EXAM: CHEST - 2 VIEW  COMPARISON:  06/11/2018. FINDINGS: Mediastinum hilar structures normal. Lungs are clear. No pleural effusion or pneumothorax. Heart size normal. No acute bony abnormality. Surgical clips right upper quadrant. Cervicothoracic fusion. IMPRESSION: No acute cardiopulmonary disease.  Chest is stable from prior exam. Electronically Signed   By: Marcello Moores  Register   On: 07/25/2018 06:09   Xr Hip Unilat W Or W/o Pelvis 2-3 Views Left  Result Date: 08/11/2018 No hip joint narrowing was noted.  No chondrocalcinosis was noted.  No SI joint changes were noted. Impression: Unremarkable x-ray of the hip joint.  Xr Hand 2 View Left  Result Date: 08/11/2018 Mild DIP and PIP narrowing was noted.  CMC narrowing was noted.  No MCP, intercarpal, radiocarpal joint space narrowing noted. 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  Xr Hand 2 View Right  Result Date: 08/11/2018 Mild DIP and PIP narrowing was noted.  CMC narrowing was noted.  No MCP, intercarpal, radiocarpal joint space narrowing noted. 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   Recent Labs: Lab Results  Component Value Date   WBC 11.3 (H) 08/24/2015   HGB 13.6 08/24/2015   PLT 379 08/24/2015   NA 140 08/24/2015   K 4.2 08/24/2015   CL 102 08/24/2015   CO2 28 08/24/2015   GLUCOSE 104 (H) 08/24/2015   BUN 8 08/24/2015   CREATININE 0.77 08/24/2015   BILITOT 0.5 08/25/2014   ALKPHOS 77 08/25/2014   AST 28 08/25/2014   ALT 25 08/25/2014   PROT 7.6 08/25/2014   ALBUMIN 4.8 08/25/2014   CALCIUM 9.3 08/24/2015   GFRAA >60 08/24/2015    Speciality Comments: No specialty comments available.  Procedures:  No procedures performed Allergies: Iodinated diagnostic agents; Nsaids; Prednisone; Propoxyphene; Morphine and related; Pantoprazole sodium; Acetaminophen; Benzonatate; Budesonide; Buprenorphine hcl; Ceftin [cefuroxime axetil]; Cefuroxime axetil; Cephalexin; Citric  acid; Clindamycin; Codeine; Cortisone; Diazepam; Diclofenac; Diltiazem; Doxycycline; Esomeprazole; Hydrocodone; Hydrocodone-acetaminophen; Ibuprofen; Lidocaine; Loracarbef; Macrobid [nitrofurantoin monohyd macro]; Maxalt [rizatriptan]; Methylprednisolone acetate; Naproxen; Omeprazole; Other; Oxycodone; Oxycodone-acetaminophen; Oxycodone-acetaminophen; Penicillins; Phenazopyridine hcl; and Tomato   Assessment / Plan:     Visit Diagnoses: Multiple joint pain-complains of pain in multiple joints.  She states the pain is mostly in her hands and her left hip.  Bilateral hand pain -she has DIP thickening with no synovitis.  Plan: XR Hand 2 View Right, XR Hand 2 View Left.  The x-rays were consistent with mild osteoarthritis of the hands.  I will obtain following labs as listed below.  Pain in left hip -she had painful range of motion of her left hip.  Plan: XR HIP UNILAT W OR W/O PELVIS 2-3 VIEWS LEFT.Marland Kitchen  X-ray of the hip joint was unremarkable.  Muscle pain-she complains of pain in all of her muscle groups.  She had several tender points and hyperalgesia.  Which raises concern of myofascial pain syndrome or fibromyalgia.  Chronic pain syndrome-patient complains of chronic pain syndrome and has an appointment coming up at pain clinic.  DDD (degenerative disc disease), cervical - s/p fusion.  She continues to have some chronic discomfort in her C-spine and complains of decreased grip strength.  She has appointment coming up with neurosurgeon.  DDD (degenerative disc disease), lumbar - s/p discectomy x3.  She continues to have lower back pain.  History of IBS-she states her IBS is still active and she is sensitive to a lot of foods and medications.  Other medical problems are listed as follows:  History of constipation  History of varicose veins  Chronic venous insufficiency  Coronary artery spasm ([facility])  Pituitary adenoma ([facility])  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[facility])  History of ovarian cyst  History of  gastroesophageal reflux (GERD)   Orders: Orders Placed This Encounter  Procedures  . XR Hand 2 View Right  . XR Hand 2 View Left  . XR HIP UNILAT W OR W/O PELVIS 2-3 VIEWS LEFT  . CBC with Differential/Platelet  . CK  . TSH  . VITAMIN D 25 Hydroxy (Vit-D Deficiency, Fractures)  . ANA   No orders of the defined types were placed in this encounter.   Face-to-face time spent with patient was 50 minutes. Greater than 50% of time was spent in counseling and coordination of care.  Follow-Up Instructions: Return for Polyarthralgia, osteoarthritis.   Bo Merino, MD  Note - This record has been created using Editor, commissioning.  Chart creation errors have been sought, but may not always  have been located. Such creation errors do not reflect on  the standard of medical care.

## 2018-08-04 ENCOUNTER — Encounter: Payer: Medicaid Other | Admitting: Physical Therapy

## 2018-08-11 ENCOUNTER — Ambulatory Visit (INDEPENDENT_AMBULATORY_CARE_PROVIDER_SITE_OTHER): Payer: Self-pay

## 2018-08-11 ENCOUNTER — Ambulatory Visit (INDEPENDENT_AMBULATORY_CARE_PROVIDER_SITE_OTHER): Payer: Medicaid Other

## 2018-08-11 ENCOUNTER — Encounter: Payer: Medicaid Other | Admitting: Physical Therapy

## 2018-08-11 ENCOUNTER — Ambulatory Visit: Payer: Medicaid Other | Admitting: Rheumatology

## 2018-08-11 ENCOUNTER — Encounter: Payer: Self-pay | Admitting: Rheumatology

## 2018-08-11 VITALS — BP 90/55 | HR 73 | Resp 14 | Ht 61.75 in | Wt 124.6 lb

## 2018-08-11 DIAGNOSIS — M79642 Pain in left hand: Secondary | ICD-10-CM

## 2018-08-11 DIAGNOSIS — M79641 Pain in right hand: Secondary | ICD-10-CM

## 2018-08-11 DIAGNOSIS — M5030 Other cervical disc degeneration, unspecified cervical region: Secondary | ICD-10-CM | POA: Insufficient documentation

## 2018-08-11 DIAGNOSIS — M2550 Pain in unspecified joint: Secondary | ICD-10-CM

## 2018-08-11 DIAGNOSIS — M5136 Other intervertebral disc degeneration, lumbar region: Secondary | ICD-10-CM

## 2018-08-11 DIAGNOSIS — M791 Myalgia, unspecified site: Secondary | ICD-10-CM | POA: Diagnosis not present

## 2018-08-11 DIAGNOSIS — I201 Angina pectoris with documented spasm: Secondary | ICD-10-CM

## 2018-08-11 DIAGNOSIS — Z8679 Personal history of other diseases of the circulatory system: Secondary | ICD-10-CM

## 2018-08-11 DIAGNOSIS — Z8719 Personal history of other diseases of the digestive system: Secondary | ICD-10-CM

## 2018-08-11 DIAGNOSIS — Z8742 Personal history of other diseases of the female genital tract: Secondary | ICD-10-CM

## 2018-08-11 DIAGNOSIS — G894 Chronic pain syndrome: Secondary | ICD-10-CM

## 2018-08-11 DIAGNOSIS — M25552 Pain in left hip: Secondary | ICD-10-CM | POA: Diagnosis not present

## 2018-08-11 DIAGNOSIS — D352 Benign neoplasm of pituitary gland: Secondary | ICD-10-CM

## 2018-08-11 DIAGNOSIS — I872 Venous insufficiency (chronic) (peripheral): Secondary | ICD-10-CM

## 2018-08-12 LAB — CBC WITH DIFFERENTIAL/PLATELET
BASOS PCT: 1 %
Basophils Absolute: 98 cells/uL (ref 0–200)
Eosinophils Absolute: 833 cells/uL — ABNORMAL HIGH (ref 15–500)
Eosinophils Relative: 8.5 %
HCT: 43.1 % (ref 35.0–45.0)
HEMOGLOBIN: 14.3 g/dL (ref 11.7–15.5)
Lymphs Abs: 1588 cells/uL (ref 850–3900)
MCH: 29.9 pg (ref 27.0–33.0)
MCHC: 33.2 g/dL (ref 32.0–36.0)
MCV: 90.2 fL (ref 80.0–100.0)
MONOS PCT: 7.8 %
MPV: 10 fL (ref 7.5–12.5)
NEUTROS ABS: 6517 {cells}/uL (ref 1500–7800)
Neutrophils Relative %: 66.5 %
Platelets: 344 10*3/uL (ref 140–400)
RBC: 4.78 10*6/uL (ref 3.80–5.10)
RDW: 11.9 % (ref 11.0–15.0)
Total Lymphocyte: 16.2 %
WBC mixed population: 764 cells/uL (ref 200–950)
WBC: 9.8 10*3/uL (ref 3.8–10.8)

## 2018-08-12 LAB — ANA: Anti Nuclear Antibody(ANA): NEGATIVE

## 2018-08-12 LAB — VITAMIN D 25 HYDROXY (VIT D DEFICIENCY, FRACTURES): Vit D, 25-Hydroxy: 33 ng/mL (ref 30–100)

## 2018-08-12 LAB — CK: CK TOTAL: 97 U/L (ref 29–143)

## 2018-08-12 LAB — TSH: TSH: 0.99 mIU/L

## 2018-08-13 NOTE — Progress Notes (Signed)
WNLs , will discuss at the fu visit

## 2018-09-03 NOTE — Progress Notes (Signed)
Office Visit Note  Patient: Virginia Shaw             Date of Birth: [birth year]           MRN: [id number]             PCP: [name], MD Referring: [name], MD Visit Date: 09/11/2018 Occupation: @GUAROCC @  Subjective:  Pain in multiple joints.   History of Present Illness: Virginia Shaw is a 52 y.o. female with osteoarthritis and DDD.  According to her she continues to have pain and discomfort in her bilateral hands.  She has been experiencing numbness and shooting pain in her hands especially at night.  She continues to have discomfort in her cervical thoracic and lumbar spine.  She continues to have generalized pain.  She also complains of discomfort in her left rib cage.  Activities of Daily Living:  Patient reports morning stiffness for 2  hours.   Patient Reports nocturnal pain.  Difficulty dressing/grooming: Reports Difficulty climbing stairs: Reports Difficulty getting out of chair: Denies Difficulty using hands for taps, buttons, cutlery, and/or writing: Reports  Review of Systems  Constitutional: Positive for fatigue.  HENT: Positive for mouth dryness.   Eyes: Positive for dryness.  Respiratory: Negative for cough and shortness of breath.   Cardiovascular: Negative for chest pain, palpitations, hypertension and swelling in legs/feet.  Gastrointestinal: Positive for constipation. Negative for blood in stool and diarrhea.  Genitourinary: Positive for difficulty urinating.  Musculoskeletal: Positive for arthralgias, joint pain, myalgias, morning stiffness and myalgias. Negative for joint swelling, muscle weakness and muscle tenderness.  Skin: Positive for sensitivity to sunlight. Negative for rash and hair loss.  Neurological: Positive for numbness and headaches. Negative for dizziness and weakness.  Psychiatric/Behavioral: Positive for sleep disturbance. Negative for depressed mood.    PMFS History:  Patient Active Problem List   Diagnosis Date Noted    . DDD (degenerative disc disease), cervical 08/11/2018  . DDD (degenerative disc disease), lumbar 08/11/2018  . Somatic complaints, multiple 03/14/2017  . Chronic pain syndrome 08/25/2014  . Irritable bowel syndrome (IBS) 01/12/2014  . 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 Chronic abdominal pain 01/11/2014  . Chronic venous insufficiency 10/30/2013  . Varicose veins 10/30/2013  . Chest tightness 03/25/2012  . 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 RECTAL BLEEDING 08/26/2008  . UTI'S, RECURRENT 08/26/2008    Past Medical History:  Diagnosis Date  . Abnormal involuntary movements(781.0)   . Allergic rhinitis, cause unspecified   . Anxiety state, unspecified   . Cervical spondylosis without myelopathy   . Disturbance of skin sensation   . Disturbance of skin sensation   . GERD (gastroesophageal reflux disease)   . History of chicken pox   . Irritable bowel syndrome   . Lumbago   . Myalgia and myositis, unspecified   . Osteoporosis   . Other and unspecified angina pectoris   . Other malaise and fatigue   . 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 Somatization disorder   . Spasm of muscle   . Systemic sclerosis ([place])   . Unspecified hereditary and idiopathic peripheral neuropathy     Family History  Problem Relation Age of Onset  . Hypertension Father   . Hyperlipidemia Father   . Diabetes Father   . Stroke Father   . Cancer Maternal Uncle        lung  . Cancer Paternal Aunt        lung  . Migraines Mother   .  Heart attack Paternal Grandmother   . Heart attack Paternal Grandfather   . Hypertension Paternal Grandfather   . Migraines Sister    Past Surgical History:  Procedure Laterality Date  . ABDOMINAL HYSTERECTOMY  1997  . arnold chiari malformation  2013  . BACK SURGERY  2001, 2011,2013   Lumbar x 2  . BLADDER SURGERY  2011  . CARDIAC CATHETERIZATION    . CATARACT EXTRACTION    . CERVICAL SPINE SURGERY  2013  . CHOLECYSTECTOMY    . COLONOSCOPY  2009    "Normal" Dr Sharlett Iles  . COLONOSCOPY  2002   "Normal" Dr Freddrick March  . COLONOSCOPY  ~2013   Dr Ferdinand Lango.  "Chest pain with the prep" = aborted  . CRANIECTOMY SUBOCCIPITAL W/ CERVICAL LAMINECTOMY / CHIARI  2012   Dr Annette Stable, Chiari decompression with suboccipital craniectomy and  . CYSTOCELE REPAIR  2002   Dr Gaetano Net  . OOPHORECTOMY  1999, 2000  . PITUITARY EXCISION  2010   pituitary cyst removed  . RECTOCELE REPAIR  2002   Dr. Gaetano Net  . tear duct tubes  2005  . TUBAL LIGATION     Social History   Social History Narrative   Lives at home with children. ( 2 daughters)   Regular exercise-yes   Caffeine Use-no   GED   Not Working outside home    Objective: Vital Signs: BP 94/61 (BP Location: Left Arm, Patient Position: Sitting, Cuff Size: Normal)   Pulse 62   Ht 5\' 1"  (1.549 m)   Wt 127 lb 6.4 oz (57.8 kg)   BMI 24.07 kg/m    Physical Exam  Constitutional: She is oriented to person, place, and time. She appears well-developed and well-nourished.  HENT:  Head: Normocephalic and atraumatic.  Eyes: Conjunctivae and EOM are normal.  Neck: Normal range of motion.  Cardiovascular: Normal rate, regular rhythm, normal heart sounds and intact distal pulses.  Pulmonary/Chest: Effort normal and breath sounds normal.  Abdominal: Soft. Bowel sounds are normal.  Lymphadenopathy:    She has no cervical adenopathy.  Neurological: She is alert and oriented to person, place, and time.  Skin: Skin is warm and dry. Capillary refill takes less than 2 seconds.  Psychiatric: She has a normal mood and affect. Her behavior is normal.  Nursing note and vitals reviewed.    Musculoskeletal Exam: C-spine limited range of motion with discomfort.  Lumbar spine limited range of motion with discomfort.  Shoulder joints elbow joints wrist joints were in good range of motion.  She has DIP and PIP thickening in bilateral hands.  She also has tenderness over MCPs.  Hip joints knee joints ankles MTPs PIPs were  in good range of motion.  She has tenderness over bilateral trochanteric bursa.  She has positive tender points and generalized hyperalgesia.  CDAI Exam: CDAI Score: Not documented Patient Global Assessment: Not documented; Provider Global Assessment: Not documented Swollen: Not documented; Tender: Not documented Joint Exam   Not documented   There is currently no information documented on the homunculus. Go to the Rheumatology activity and complete the homunculus joint exam.  Investigation: No additional findings.  Imaging: [place] Extrem Up Bilat Comp  Result Date: 09/11/2018 Ultrasound examination of bilateral hands was performed per EULAR recommendations. Using 12 MHz transducer, grayscale and power Doppler bilateral second, third, and fifth MCP joints and bilateral wrist joints both dorsal and volar aspects were evaluated to look for synovitis or tenosynovitis. The findings were there was no synovitis or tenosynovitis on  ultrasound examination. Right median nerve was 0.09 cm squares which was within normal limits and left median nerve was 0.08 cm squares which was in normal limits. Impression: Ultrasound examination of bilateral hands did not show any synovitis.  Bilateral median nerves within normal limits.   Recent Labs: Lab Results  Component Value Date   WBC 9.8 08/11/2018   HGB 14.3 08/11/2018   PLT 344 08/11/2018   NA 140 08/24/2015   K 4.2 08/24/2015   CL 102 08/24/2015   CO2 28 08/24/2015   GLUCOSE 104 (H) 08/24/2015   BUN 8 08/24/2015   CREATININE 0.77 08/24/2015   BILITOT 0.5 08/25/2014   ALKPHOS 77 08/25/2014   AST 28 08/25/2014   ALT 25 08/25/2014   PROT 7.6 08/25/2014   ALBUMIN 4.8 08/25/2014   CALCIUM 9.3 08/24/2015   GFRAA >60 08/24/2015  Per 05/2018 CK 97, TSH 0.99, ANA negative, vitamin D 33  Speciality Comments: No specialty comments available.  Procedures:  No procedures performed Allergies: Iodinated diagnostic agents; Nsaids; Prednisone;  Propoxyphene; Morphine and related; Pantoprazole sodium; Acetaminophen; Benzonatate; Budesonide; Buprenorphine hcl; Ceftin [cefuroxime axetil]; Cefuroxime axetil; Cephalexin; Citric acid; Clindamycin; Codeine; Cortisone; Diazepam; Diclofenac; Diltiazem; Doxycycline; Esomeprazole; Hydrocodone; Hydrocodone-acetaminophen; Ibuprofen; Lidocaine; Loracarbef; Macrobid [nitrofurantoin monohyd macro]; Maxalt [rizatriptan]; Methylprednisolone acetate; Naproxen; Omeprazole; Other; Oxycodone; Oxycodone-acetaminophen; Oxycodone-acetaminophen; Penicillins; Phenazopyridine hcl; and Tomato   Assessment / Plan:     Visit Diagnoses:  Pain in both hands-she has pain and discomfort in her bilateral hands.  She gives history of intermittent swelling.  I do not see any synovitis on examination.  We will schedule ultrasound of bilateral hands today.  All autoimmune labs were discussed which were negative.  Ultrasound obtained today of bilateral hands did not show any synovitis.  A handout on natural anti-inflammatories was given.  Primary osteoarthritis of both hands-clinical findings and radiographic findings are consistent with osteoarthritis.  Joint protection muscle strengthening was discussed.  A handout on hand exercises was given.  Prescription for Voltaren gel was given which can be used topically.  Side effects were reviewed.  DDD (degenerative disc disease), cervical - Status post fusion.  She has limited range of motion of her cervical spine with discomfort.  DDD (degenerative disc disease), lumbar - Status post discectomy x3 she continues to have some lower back pain.  Myofascial pain-she has positive tender points and generalized pain which causes discomfort.  I will refer her to physical therapy.  Chronic pain syndrome   Other medical problems are listed as follows:  History of IBS-she states her IBS is still active and she is sensitive to a lot of foods and medications.  History of  constipation  History of varicose veins  Chronic venous insufficiency  Coronary artery spasm (HCC)  Pituitary adenoma (HCC)  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HCC)  History of ovarian cyst  History of gastroesophageal reflux (GERD)   Orders: Orders Placed This Encounter  Procedures  . [place] Extrem Up Bilat Comp  . Ambulatory referral to Physical Therapy   Meds ordered this encounter  Medications  . diclofenac sodium (VOLTAREN) 1 % GEL    Sig: 3 grams to 3 large joints up to 3 times daily    Dispense:  3 Tube    Refill:  3    Face-to-face time spent with patient was 30 minutes. Greater than 50% of time was spent in counseling and coordination of care.  Follow-Up Instructions: Return in about 6 months (around 03/12/2019).   Bo Merino, MD  Note - This record has  been created using [employer].  Chart creation errors have been sought, but may not always  have been located. Such creation errors do not reflect on  the standard of medical care.

## 2018-09-11 ENCOUNTER — Ambulatory Visit (INDEPENDENT_AMBULATORY_CARE_PROVIDER_SITE_OTHER): Payer: Self-pay

## 2018-09-11 ENCOUNTER — Ambulatory Visit: Payer: Medicaid Other | Admitting: Rheumatology

## 2018-09-11 ENCOUNTER — Encounter: Payer: Self-pay | Admitting: Rheumatology

## 2018-09-11 VITALS — BP 94/61 | HR 62 | Ht 61.0 in | Wt 127.4 lb

## 2018-09-11 DIAGNOSIS — M19041 Primary osteoarthritis, right hand: Secondary | ICD-10-CM | POA: Diagnosis not present

## 2018-09-11 DIAGNOSIS — G894 Chronic pain syndrome: Secondary | ICD-10-CM

## 2018-09-11 DIAGNOSIS — M797 Fibromyalgia: Secondary | ICD-10-CM

## 2018-09-11 DIAGNOSIS — M7918 Myalgia, other site: Secondary | ICD-10-CM | POA: Diagnosis not present

## 2018-09-11 DIAGNOSIS — M19042 Primary osteoarthritis, left hand: Secondary | ICD-10-CM

## 2018-09-11 DIAGNOSIS — M79641 Pain in right hand: Secondary | ICD-10-CM

## 2018-09-11 DIAGNOSIS — M5136 Other intervertebral disc degeneration, lumbar region: Secondary | ICD-10-CM | POA: Diagnosis not present

## 2018-09-11 DIAGNOSIS — M79642 Pain in left hand: Secondary | ICD-10-CM

## 2018-09-11 DIAGNOSIS — M5030 Other cervical disc degeneration, unspecified cervical region: Secondary | ICD-10-CM | POA: Diagnosis not present

## 2018-09-11 DIAGNOSIS — M79605 Pain in left leg: Secondary | ICD-10-CM

## 2018-09-11 MED ORDER — DICLOFENAC SODIUM 1 % TD GEL: TRANSDERMAL | 3 refills | Status: DC

## 2018-09-11 NOTE — Patient Instructions (Signed)

## 2018-10-08 ENCOUNTER — Encounter: Payer: Self-pay | Admitting: Physical Therapy

## 2018-10-08 ENCOUNTER — Ambulatory Visit: Payer: Medicaid Other | Attending: Rheumatology | Admitting: Physical Therapy

## 2018-10-08 DIAGNOSIS — M79641 Pain in right hand: Secondary | ICD-10-CM | POA: Insufficient documentation

## 2018-10-08 DIAGNOSIS — M79642 Pain in left hand: Secondary | ICD-10-CM

## 2018-10-08 DIAGNOSIS — M542 Cervicalgia: Secondary | ICD-10-CM | POA: Diagnosis present

## 2018-10-08 DIAGNOSIS — M5442 Lumbago with sciatica, left side: Secondary | ICD-10-CM | POA: Diagnosis not present

## 2018-10-08 DIAGNOSIS — G8929 Other chronic pain: Secondary | ICD-10-CM | POA: Diagnosis present

## 2018-10-08 NOTE — Patient Instructions (Addendum)
Access Code: [email or web address]  URL: [email or web address]  Date: 10/08/2018  Prepared by: Elsie Ra   Exercises  Seated Cervical Sidebending Stretch - 3 sets - 30 hold - 2x daily - 6x weekly  Seated Cervical Retraction - 10 reps - 3 sets - 2x daily - 6x weekly  Standing Cervical Rotation AROM with self Overpressure - 10 reps - 1-2 sets - 2x daily - 6x weekly  Seated Scapular Retraction - 10 reps - 2-3 sets - 5 sec hold - 2x daily - 6x weekly  Seated Gripping Towel - 20 reps - 2 sets - 2x daily - 6x weekly  Seated Sciatic Tensioner - 10 reps - 1 sets - 3 hold - 2x daily - 6x weekly   Contrast bath instructions for hand 2 min warm water, 2 min cold water total of 16-20 min

## 2018-10-08 NOTE — Therapy (Signed)
[facility] [address]  [address] [place], [place], [address] Phone: [phone]   Fax:  [phone]  Physical Therapy Evaluation  Patient Details  Name: Virginia Shaw MRN: [id number] Date of Birth: 1966/08/12 Referring Provider (PT): Barnet Glasgow, MD   Encounter Date: 10/08/2018  PT End of Session - 10/08/18 2115    Visit Number  1    Number of Visits  8    Date for PT Re-Evaluation  11/04/18    Authorization Type  MCD, need to resubmit at end of year    PT Start Time  1615    PT Stop Time  1705    PT Time Calculation (min)  50 min    Activity Tolerance  Patient tolerated treatment well    Behavior During Therapy  [facility] for tasks assessed/performed       Past Medical History:  Diagnosis Date  . Abnormal involuntary movements(781.0)   . Allergic rhinitis, cause unspecified   . Anxiety state, unspecified   . Cervical spondylosis without myelopathy   . Disturbance of skin sensation   . Disturbance of skin sensation   . GERD (gastroesophageal reflux disease)   . History of chicken pox   . Irritable bowel syndrome   . Lumbago   . Myalgia and myositis, unspecified   . Osteoporosis   . Other and unspecified angina pectoris   . Other malaise and fatigue   . 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 Somatization disorder   . Spasm of muscle   . Systemic sclerosis ([place])   . Unspecified hereditary and idiopathic peripheral neuropathy     Past Surgical History:  Procedure Laterality Date  . ABDOMINAL HYSTERECTOMY  1997  . arnold chiari malformation  2013  . BACK SURGERY  2001, 2011,2013   Lumbar x 2  . BLADDER SURGERY  2011  . CARDIAC CATHETERIZATION    . CATARACT EXTRACTION    . CERVICAL SPINE SURGERY  2013  . CHOLECYSTECTOMY    . COLONOSCOPY  2009   "Normal" Dr Sharlett Iles  . COLONOSCOPY  2002   "Normal" Dr Freddrick March  . COLONOSCOPY  ~2013   Dr Ferdinand Lango.  "Chest pain with the prep" = aborted  . CRANIECTOMY  SUBOCCIPITAL W/ CERVICAL LAMINECTOMY / CHIARI  2012   Dr Annette Stable, Chiari decompression with suboccipital craniectomy and  . CYSTOCELE REPAIR  2002   Dr Gaetano Net  . OOPHORECTOMY  1999, 2000  . PITUITARY EXCISION  2010   pituitary cyst removed  . RECTOCELE REPAIR  2002   Dr. Gaetano Net  . tear duct tubes  2005  . TUBAL LIGATION      There were no vitals filed for this visit.   Subjective Assessment - 10/08/18 2033    Subjective  Pt relays complex medical history of having 3 back surgeries, neck fusion surgery, bilat hand pain due to OA and CTS, and Left leg pain all for which she was referred to PT for. She relays she had NCV test last week for CTS but she has not recieved results yet. She relays chronic pain for many years. She had 5 Pt visits at our [address] location but relays she had to stop due to it was aggravating her.     Pertinent History  complex medical history    Limitations  Sitting;Reading;Lifting;Standing;Walking;House hold activities    How long can you sit comfortably?  15 min    How long can you stand comfortably?  10 min  How long can you walk comfortably?  10 min    Diagnostic tests  see chart    Patient Stated Goals  decrease pain and be able to do more    Currently in Pain?  Yes    Pain Score  --   varries 6-9   Pain Location  Back    Pain Orientation  Right;Left;Lower    Pain Descriptors / Indicators  Sharp    Pain Type  Chronic pain    Pain Radiating Towards  Lt leg    Pain Onset  More than a month ago    Pain Frequency  Constant    Aggravating Factors   any activity    Pain Relieving Factors  nothing other than leaning off her Lt side to her Rt    Multiple Pain Sites  Yes    Pain Score  4    Pain Location  Neck    Pain Orientation  Right;Left    Pain Descriptors / Indicators  Aching;Tightness    Pain Type  Chronic pain    Pain Radiating Towards  up to head, has headaches    Pain Onset  More than a month ago    Pain Frequency  Constant    Aggravating  Factors   looking in any direction, yawning, laying down flat    Pain Relieving Factors  nothing    Pain Score  5    Pain Location  Hand    Pain Orientation  Right;Left    Pain Descriptors / Indicators  Numbness;Aching;Tingling    Pain Type  Chronic pain    Pain Onset  More than a month ago    Pain Frequency  Constant    Aggravating Factors   gripping, carrying    Pain Relieving Factors  nothing         [facility] PT Assessment - 10/08/18 0001      Assessment   Medical Diagnosis  chronic LBP with Lt radiculopathy, cervicalgia,bilat hand pain    Referring Provider (PT)  Barnet Glasgow, MD    Onset Date/Surgical Date  --   chronic   Hand Dominance  Right    Next MD Visit  5 months    Prior Therapy  5 PT visits earlier this year      Precautions   Precaution Comments  Cerv Fusion so no spinal mobs or traction      Balance Screen   Has the patient fallen in the past 6 months  Yes    How many times?  1    Has the patient had a decrease in activity level because of a fear of falling?   No    Is the patient reluctant to leave their home because of a fear of falling?   No      Home Social worker  Private residence    Living Arrangements  Children    Additional Comments  3 steps at front door      Prior Function   Level of Independence  Independent with basic ADLs      Cognition   Overall Cognitive Status  Within Functional Limits for tasks assessed      Observation/Other Assessments   Focus on Therapeutic Outcomes (FOTO)   not done, MCD and multiple body parts      Sensation   Light Touch  Appears Intact      Posture/Postural Control   Posture Comments  foward flexed posture  ROM / Strength   AROM / PROM / Strength  AROM;Strength      AROM   AROM Assessment Site  Cervical;Lumbar    Cervical Flexion  25%    Cervical Extension  25%    Cervical - Right Side Bend  25%    Cervical - Left Side Bend  25%    Cervical - Right Rotation  25%     Cervical - Left Rotation  25%    Lumbar Flexion  50%    Lumbar Extension  25%    Lumbar - Right Side Bend  75%    Lumbar - Left Side Bend  75%    Lumbar - Right Rotation  50%    Lumbar - Left Rotation  50%      Strength   Overall Strength Comments  4/5 MMT bilat UE and LE grossly, Rt grip strength 25 lbs avg, Lt grip strength 15 lbs avg      Flexibility   Soft Tissue Assessment /Muscle Length  --   tight lumbar and cervical P.S, traps, H.S. bilat     Palpation   Palpation comment  very TTP widspead through cervical-thoraic-lumbar      Special Tests   Other special tests  +SLR, +slump on Lt      Transfers   Transfers  Independent with all Transfers   extra time needed     Ambulation/Gait   Gait Comments  slow speed, decreased step length, flexed foward posture                Objective measurements completed on examination: See above findings.      [place] Adult PT Treatment/Exercise - 10/08/18 0001      Modalities   Modalities  Moist Heat      Moist Heat Therapy   Number Minutes Moist Heat  10 Minutes    Moist Heat Location  Hand;Lumbar Spine;Cervical             PT Education - 10/08/18 2115    Education Details  HEP,POC, contrast bath for hand pain    Person(s) Educated  Patient    Methods  Explanation;Demonstration;Verbal cues;Handout    Comprehension  Verbalized understanding;Need further instruction       PT Short Term Goals - 10/08/18 2122      PT SHORT TERM GOAL #1   Title  Pt will be able to sit comfortable at least 10 min. 10/07/18    Baseline  5 min    Time  4    Period  Weeks    Status  New      PT SHORT TERM GOAL #2   Title  Pt will verbalize independence with HEP as it has been established in the short term. 11/04/18    Baseline  no HEP until today    Time  4    Period  Weeks    Status  New        PT Long Term Goals - 10/08/18 2136      PT LONG TERM GOAL #1   Title  Pt will be able to complete household chores pain  <=4/10. 8 weeks 12/03/17    Baseline  severe and limiting at this time    Time  8    Period  Weeks    Status  New      PT LONG TERM GOAL #3   Title  Pt will improve neck and back ROM to [facility]. 8 weeks 12/03/17  Baseline  neck ROM limited 75%, back flexion limited 50%, extension limited 75%    Time  8    Period  Weeks    Status  New      PT LONG TERM GOAL #4   Title  Pt will increase grip strength by 5lbs on Lt, and increase gross UE/LE strength to 4+/5 MMT.     Baseline  15 lbs Lt grip strength, UE/LE strength grossly 4/5     Time  8    Period  Weeks    Status  New             Plan - 10/08/18 2117    Clinical Impression Statement  Pt presents with chronic pain syndrome for lumbar and cervical pain as well as hand pain, OA,DDD and possible carpal tunnel syndrome although she has not yet had the results of her NVC test. She has complex medical history with multiple co morbidites, neck fusion, and 3 back surgeries. She has decreased ROM, decreased strength, decreased activity tolerance, and increased pain limiting her funciton. She will benefit from skilled PT to addres her deficits.     Clinical Presentation  Evolving    Clinical Presentation due to:  widespread evolving pain with changing characterists    Clinical Decision Making  High    Rehab Potential  Fair    PT Frequency  1x / week    PT Duration  8 weeks    PT Treatment/Interventions  Cryotherapy;Electrical Stimulation;Moist Heat;Ultrasound;Therapeutic activities;Therapeutic exercise;Neuromuscular re-education;Passive range of motion;Manual techniques;Taping    PT Next Visit Plan  gentle ROM, pain edu, consider TUG or 5TSTS or any other functional test    Consulted and Agree with Plan of Care  Patient       Patient will benefit from skilled therapeutic intervention in order to improve the following deficits and impairments:  Decreased activity tolerance, Abnormal gait, Decreased endurance, Decreased range of motion,  Decreased strength, Difficulty walking, Increased fascial restricitons, Increased muscle spasms, Impaired flexibility, Improper body mechanics, Postural dysfunction, Pain  Visit Diagnosis: Chronic bilateral low back pain with left-sided sciatica  Cervicalgia  Pain in right hand  Pain in left hand     Problem List Patient Active Problem List   Diagnosis Date Noted  . DDD (degenerative disc disease), cervical 08/11/2018  . DDD (degenerative disc disease), lumbar 08/11/2018  . Somatic complaints, multiple 03/14/2017  . Chronic pain syndrome 08/25/2014  . Irritable bowel syndrome (IBS) 01/12/2014  . 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 Chronic abdominal pain 01/11/2014  . Chronic venous insufficiency 10/30/2013  . Varicose veins 10/30/2013  . Chest tightness 03/25/2012  . 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 RECTAL BLEEDING 08/26/2008  . UTI'S, RECURRENT 08/26/2008    Debbe Odea, PT,DPT 10/08/2018, 9:45 PM  [facility] [address]  [place], [place], [address] Phone: [phone]   Fax:  [phone]  Name: Virginia Shaw MRN: [id number] Date of Birth: 12/21/1965

## 2018-10-14 ENCOUNTER — Ambulatory Visit: Payer: Medicaid Other | Admitting: Physical Therapy

## 2018-10-14 ENCOUNTER — Encounter: Payer: Self-pay | Admitting: Physical Therapy

## 2018-10-14 DIAGNOSIS — G8929 Other chronic pain: Secondary | ICD-10-CM

## 2018-10-14 DIAGNOSIS — M79642 Pain in left hand: Secondary | ICD-10-CM

## 2018-10-14 DIAGNOSIS — M5442 Lumbago with sciatica, left side: Principal | ICD-10-CM

## 2018-10-14 DIAGNOSIS — M79641 Pain in right hand: Secondary | ICD-10-CM

## 2018-10-14 DIAGNOSIS — M542 Cervicalgia: Secondary | ICD-10-CM

## 2018-10-14 NOTE — Therapy (Addendum)
[facility] [address]  [place], [place], [address] Phone: [phone]   Fax:  [phone]  Physical Therapy Treatment/Discharge addendum  Patient Details  Name: Virginia Shaw MRN: [id number] Date of Birth: [birth year] Referring Provider (PT): Barnet Glasgow, MD   Encounter Date: 10/14/2018  PT End of Session - 10/14/18 3734    Visit Number  2    Number of Visits  8    Date for PT Re-Evaluation  11/04/18    Authorization Type  MCD, need to resubmit at end of year    Authorization Time Period  10/13/18 - 11/02/18    Authorization - Visit Number  1    Authorization - Number of Visits  3    PT Start Time  1700    PT Stop Time  2876    PT Time Calculation (min)  43 min    Activity Tolerance  Patient tolerated treatment well;Patient limited by pain;No increased pain    Behavior During Therapy  WFL for tasks assessed/performed       Past Medical History:  Diagnosis Date  . Abnormal involuntary movements(781.0)   . Allergic rhinitis, cause unspecified   . Anxiety state, unspecified   . Cervical spondylosis without myelopathy   . Disturbance of skin sensation   . Disturbance of skin sensation   . GERD (gastroesophageal reflux disease)   . History of chicken pox   . Irritable bowel syndrome   . Lumbago   . Myalgia and myositis, unspecified   . Osteoporosis   . Other and unspecified angina pectoris   . Other malaise and fatigue   . 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 Somatization disorder   . Spasm of muscle   . Systemic sclerosis ([place])   . Unspecified hereditary and idiopathic peripheral neuropathy     Past Surgical History:  Procedure Laterality Date  . ABDOMINAL HYSTERECTOMY  1997  . arnold chiari malformation  2013  . BACK SURGERY  2001, 2011,2013   Lumbar x 2  . BLADDER SURGERY  2011  . CARDIAC CATHETERIZATION    . CATARACT EXTRACTION    . CERVICAL SPINE SURGERY  2013  . CHOLECYSTECTOMY     . COLONOSCOPY  2009   "Normal" Dr Sharlett Iles  . COLONOSCOPY  2002   "Normal" Dr Freddrick March  . COLONOSCOPY  ~2013   Dr Ferdinand Lango.  "Chest pain with the prep" = aborted  . CRANIECTOMY SUBOCCIPITAL W/ CERVICAL LAMINECTOMY / CHIARI  2012   Dr Annette Stable, Chiari decompression with suboccipital craniectomy and  . CYSTOCELE REPAIR  2002   Dr Gaetano Net  . OOPHORECTOMY  1999, 2000  . PITUITARY EXCISION  2010   pituitary cyst removed  . RECTOCELE REPAIR  2002   Dr. Gaetano Net  . tear duct tubes  2005  . TUBAL LIGATION      There were no vitals filed for this visit.  Subjective Assessment - 10/14/18 1703    Subjective  Reports that L anterior hip and abdomen and L LB has been bothering her more since last session. Has tried HEP- has not been able to do some of the hand exercises. Reports that she was told by an MD that she needs hernia repair on B sides.     Pertinent History  complex medical history    Diagnostic tests  see chart    Patient Stated Goals  decrease pain and be able to do more    Currently in Pain?  Yes    Pain Score  7     Pain Location  Back    Pain Orientation  Left;Lower    Pain Descriptors / Indicators  Sharp    Pain Type  Chronic pain    Pain Radiating Towards  L anterior hip and abdomen                       [facility] Adult PT Treatment/Exercise - 10/14/18 0001      Exercises   Exercises  Lumbar;Knee/Hip;Neck      Neck Exercises: Seated   Neck Retraction  10 reps    Neck Retraction Limitations  very limited range    Shoulder Rolls  Backwards;Forwards;10 reps    Shoulder Rolls Limitations  good tolerance of backwards, L anterior shoulder pain with forward    Other Seated Exercise  B scapular retraction 10x3"      Lumbar Exercises: Aerobic   Nustep  L1 x 1 min   could not tolerate     Lumbar Exercises: Supine   Other Supine Lumbar Exercises  LTR 20x    limited range- to tolerance     Knee/Hip Exercises: Stretches   ITB Stretch  Left;1 rep;20 seconds     ITB Stretch Limitations  limited range- could not tolerate    Piriformis Stretch  Left;1 rep;20 seconds    Piriformis Stretch Limitations  limited range- could not tolerate      Knee/Hip Exercises: Sidelying   Clams  L LE 10x   required 2 rest breaks d/t pain in L HS and L quad     Manual Therapy   Manual Therapy  Soft tissue mobilization    Soft tissue mobilization  gentle STM to L lumbar paraspinals, superior glute, TFL- severely TTP in these areas      Neck Exercises: Stretches   Upper Trapezius Stretch  Right;Left;1 rep;20 seconds    Upper Trapezius Stretch Limitations  very limited range    Levator Stretch  Right;Left;1 rep;20 seconds    Levator Stretch Limitations  very limited range; unable to tolerate d/t HA pain             PT Education - 10/14/18 1746    Education Details  advised patient to perform HEP to tolerance and ease off if exercises are bothering her; advised patient to contact MD about possible need for hernia repair    Person(s) Educated  Patient    Methods  Explanation;Demonstration;Tactile cues;Verbal cues    Comprehension  Verbalized understanding;Returned demonstration       PT Short Term Goals - 10/14/18 1756      PT SHORT TERM GOAL #1   Title  Pt will be able to sit comfortable at least 10 min. 10/07/18    Baseline  5 min    Time  4    Period  Weeks    Status  On-going      PT SHORT TERM GOAL #2   Title  Pt will verbalize independence with HEP as it has been established in the short term. 11/04/18    Baseline  no HEP until today    Time  4    Period  Weeks    Status  On-going        PT Long Term Goals - 10/14/18 1757      PT LONG TERM GOAL #1   Title  Pt will be able to complete household chores pain <=4/10. 8 weeks 12/03/17  Baseline  severe and limiting at this time    Time  8    Period  Weeks    Status  On-going      PT LONG TERM GOAL #3   Title  Pt will improve neck and back ROM to [facility]. 8 weeks 12/03/17    Baseline  neck  ROM limited 75%, back flexion limited 50%, extension limited 75%    Time  8    Period  Weeks    Status  On-going      PT LONG TERM GOAL #4   Title  Pt will increase grip strength by 5lbs on Lt, and increase gross UE/LE strength to 4+/5 MMT.     Baseline  15 lbs Lt grip strength, UE/LE strength grossly 4/5     Time  8    Period  Weeks    Status  On-going            Plan - 10/14/18 1748    Clinical Impression Statement  Patient arrived to session with report that her L LB and anterior hip pain was flared up from last session. Did mention that she has been told by an MD that she requires hernia surgery on B sides. Reports she has an appointment with this MD- advised patient to follow up with MD about this as hip pain could be caused by hernias. Patient reported understanding. Provided STM to L lumbar paraspinals, superior glute, and along TFL with TTP in these areas. Attempted glute and TFL stretching which patient could not tolerate. Patient limited by LB, hip/abdomen, and neck pain with most ther-ex attempted in supine and sidelying today. Better tolerance of cervical stretching and strengthening in sitting. Advised patient to attempt HEP at home to tolerance and to ease off if pain worsens. Patient reported understanding. Reported no increase in pain at end of session.    PT Treatment/Interventions  Cryotherapy;Electrical Stimulation;Moist Heat;Ultrasound;Therapeutic activities;Therapeutic exercise;Neuromuscular re-education;Passive range of motion;Manual techniques;Taping    PT Next Visit Plan  gentle ROM, pain edu, consider TUG or 5TSTS or any other functional test    Consulted and Agree with Plan of Care  Patient       Patient will benefit from skilled therapeutic intervention in order to improve the following deficits and impairments:  Decreased activity tolerance, Abnormal gait, Decreased endurance, Decreased range of motion, Decreased strength, Difficulty walking, Increased fascial  restricitons, Increased muscle spasms, Impaired flexibility, Improper body mechanics, Postural dysfunction, Pain  Visit Diagnosis: Chronic bilateral low back pain with left-sided sciatica  Cervicalgia  Pain in right hand  Pain in left hand     Problem List Patient Active Problem List   Diagnosis Date Noted  . DDD (degenerative disc disease), cervical 08/11/2018  . DDD (degenerative disc disease), lumbar 08/11/2018  . Somatic complaints, multiple 03/14/2017  . Chronic pain syndrome 08/25/2014  . Irritable bowel syndrome (IBS) 01/12/2014  . 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 Chronic abdominal pain 01/11/2014  . Chronic venous insufficiency 10/30/2013  . Varicose veins 10/30/2013  . Chest tightness 03/25/2012  . 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 RECTAL BLEEDING 08/26/2008  . UTI'S, RECURRENT 08/26/2008    Janene Harvey, PT, DPT 10/14/18 5:59 PM  PHYSICAL THERAPY DISCHARGE SUMMARY  Visits from Start of Care: 2  Current functional level related to goals / functional outcomes: See above  Remaining deficits: See above   Education / Equipment: HEP, instructions to return back to MD as PT is not helping her and she says pain  is worse. Plan: Patient agrees to discharge.  Patient goals were not met. Patient is being discharged due to lack of progress.  ?????    Elsie Ra, PT, DPT 11/19/18 2:51 PM   [facility] [address]  [place], [place], [address] Phone: [phone]   Fax:  [phone]  Name: Virginia Shaw MRN: [id number] Date of Birth: 12-25-1965

## 2018-10-15 ENCOUNTER — Other Ambulatory Visit: Payer: Self-pay | Admitting: Obstetrics and Gynecology

## 2018-10-15 DIAGNOSIS — N644 Mastodynia: Secondary | ICD-10-CM

## 2018-10-21 ENCOUNTER — Ambulatory Visit
Admission: RE | Admit: 2018-10-21 | Discharge: 2018-10-21 | Disposition: A | Discharge status: Home / Self Care | Payer: Medicaid Other | Source: Ambulatory Visit | Attending: Obstetrics and Gynecology | Admitting: Obstetrics and Gynecology

## 2018-10-21 DIAGNOSIS — N644 Mastodynia: Secondary | ICD-10-CM

## 2018-10-22 ENCOUNTER — Ambulatory Visit: Payer: Medicaid Other | Admitting: Physical Therapy

## 2018-12-05 NOTE — Progress Notes (Signed)
Office Visit Note  Patient: Virginia Shaw             Date of Birth: 04-03-1966           MRN: [id number]             PCP: [name], MD Referring: [name], MD Visit Date: 12/08/2018 Occupation: @GUAROCC @  Subjective:  Pain in both hands   History of Present Illness: Virginia Shaw is a 53 y.o. female with history of osteoarthritis, DDD, and myofascial pain syndrome.  Patient presents today with bilateral hand pain and bilateral wrist pain.  She states that she notices intermittent joint swelling in bilateral hands.  She says she is also been having increased muscle tenderness in bilateral upper extremities.  She states that the pain often wakes her up at night.  She states that she has a good range of motion of both shoulders.  She reports that she is having numbness and tingling in bilateral hands.  She states that she had a nerve conduction study about 1 month ago.  She reports that she has noticed decreased grip strength.  She is also been having increased discomfort in the left hip and left groin.  She states that she has difficulty getting out of the car due to discomfort.  She states that occasionally she will experience a sharp pain with motion.    Activities of Daily Living:  Patient reports morning stiffness for 5-10 minutes.   Patient Reports nocturnal pain.  Difficulty dressing/grooming: Denies Difficulty climbing stairs: Denies Difficulty getting out of chair: Denies Difficulty using hands for taps, buttons, cutlery, and/or writing: Reports  Review of Systems  Constitutional: Positive for fatigue.  HENT: Negative for mouth sores, mouth dryness and nose dryness.   Eyes: Positive for dryness. Negative for pain and visual disturbance.  Respiratory: Negative for cough, hemoptysis, shortness of breath and difficulty breathing.   Cardiovascular: Negative for chest pain, palpitations, hypertension and swelling in legs/feet.  Gastrointestinal: Negative  for blood in stool, constipation and diarrhea.  Endocrine: Negative for increased urination.  Genitourinary: Positive for painful urination (Seeing urologist).  Musculoskeletal: Positive for arthralgias, joint pain, morning stiffness and muscle tenderness. Negative for joint swelling, myalgias, muscle weakness and myalgias.  Skin: Negative for color change, pallor, rash, hair loss, nodules/bumps, skin tightness, ulcers and sensitivity to sunlight.  Allergic/Immunologic: Negative for susceptible to infections.  Neurological: Negative for dizziness, numbness, headaches and weakness.  Hematological: Negative for swollen glands.  Psychiatric/Behavioral: Positive for sleep disturbance. Negative for depressed mood. The patient is not nervous/anxious.     PMFS History:  Patient Active Problem List   Diagnosis Date Noted  . DDD (degenerative disc disease), cervical 08/11/2018  . DDD (degenerative disc disease), lumbar 08/11/2018  . Somatic complaints, multiple 03/14/2017  . Chronic pain syndrome 08/25/2014  . Irritable bowel syndrome (IBS) 01/12/2014  . 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 Chronic abdominal pain 01/11/2014  . Chronic venous insufficiency 10/30/2013  . Varicose veins 10/30/2013  . Chest tightness 03/25/2012  . 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 RECTAL BLEEDING 08/26/2008  . UTI'S, RECURRENT 08/26/2008    Past Medical History:  Diagnosis Date  . Abnormal involuntary movements(781.0)   . Allergic rhinitis, cause unspecified   . Anxiety state, unspecified   . Cervical spondylosis without myelopathy   . Disturbance of skin sensation   . Disturbance of skin sensation   . GERD (gastroesophageal reflux disease)   . History of chicken pox   .  Irritable bowel syndrome   . Lumbago   . Myalgia and myositis, unspecified   . Osteoporosis   . Other and unspecified angina pectoris   . Other malaise and fatigue   . 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 Somatization  disorder   . Spasm of muscle   . Systemic sclerosis ([place])   . Unspecified hereditary and idiopathic peripheral neuropathy     Family History  Problem Relation Age of Onset  . Hypertension Father   . Hyperlipidemia Father   . Diabetes Father   . Stroke Father   . Cancer Maternal Uncle        lung  . Cancer Paternal Aunt        lung  . Migraines Mother   . Heart attack Paternal Grandmother   . Heart attack Paternal Grandfather   . Hypertension Paternal Grandfather   . Migraines Sister    Past Surgical History:  Procedure Laterality Date  . ABDOMINAL HYSTERECTOMY  1997  . arnold chiari malformation  2013  . BACK SURGERY  2001, 2011,2013   Lumbar x 2  . BLADDER SURGERY  2011  . CARDIAC CATHETERIZATION    . CATARACT EXTRACTION    . CERVICAL SPINE SURGERY  2013  . CHOLECYSTECTOMY    . COLONOSCOPY  2009   "Normal" Dr Sharlett Iles  . COLONOSCOPY  2002   "Normal" Dr Freddrick March  . COLONOSCOPY  ~2013   Dr Ferdinand Lango.  "Chest pain with the prep" = aborted  . CRANIECTOMY SUBOCCIPITAL W/ CERVICAL LAMINECTOMY / CHIARI  2012   Dr Annette Stable, Chiari decompression with suboccipital craniectomy and  . CYSTOCELE REPAIR  2002   Dr Gaetano Net  . OOPHORECTOMY  1999, 2000  . PITUITARY EXCISION  2010   pituitary cyst removed  . RECTOCELE REPAIR  2002   Dr. Gaetano Net  . tear duct tubes  2005  . TUBAL LIGATION     Social History   Social History Narrative   Lives at home with children. ( 2 daughters)   Regular exercise-yes   Caffeine Use-no   GED   Not Working outside home    There is no immunization history on file for this patient.   Objective: Vital Signs: BP 111/77 (BP Location: Left Arm, Patient Position: Sitting, Cuff Size: Normal)   Pulse 77   Resp 13   Ht 4\' 11"  (1.499 m)   Wt 129 lb 12.8 oz (58.9 kg)   BMI 26.22 kg/m    Physical Exam Vitals signs and nursing note reviewed.  Constitutional:      Appearance: She is well-developed.  HENT:     Head: Normocephalic and atraumatic.    Eyes:     Conjunctiva/sclera: Conjunctivae normal.  Neck:     Musculoskeletal: Normal range of motion.  Cardiovascular:     Rate and Rhythm: Normal rate and regular rhythm.     Heart sounds: Normal heart sounds.  Pulmonary:     Effort: Pulmonary effort is normal.     Breath sounds: Normal breath sounds.  Abdominal:     General: Bowel sounds are normal.     Palpations: Abdomen is soft.  Lymphadenopathy:     Cervical: No cervical adenopathy.  Skin:    General: Skin is warm and dry.     Capillary Refill: Capillary refill takes less than 2 seconds.  Neurological:     Mental Status: She is alert and oriented to person, place, and time.  Psychiatric:        Behavior: Behavior  normal.      Musculoskeletal Exam: Positive tender points and generalized hyperalgesia on exam. C-spine limited ROM. Thoracic and lumbar spine good ROM.  Midline spinal tenderness in mid thoracic region.  Bilateral SI joint tenderness.  Shoulder joints, elbow joints wrist joints, MCPs, PIPs, and DIPs good ROM with no synovitis.  Complete fist formation bilaterally. DIP synovial thickening consistent with osteoarthritis. Knee joints, ankle joints, MTPs, PIPs ,and DIPs good ROM with no synovitis.  No warmth or effusion of knee joints.  No tenderness or swelling of ankle joints.  Tenderness over trochanteric bursa bilaterally  Left hip limited ROM with discomfort.   CDAI Exam: CDAI Score: Not documented Patient Global Assessment: Not documented; Provider Global Assessment: Not documented Swollen: Not documented; Tender: Not documented Joint Exam   Not documented   There is currently no information documented on the homunculus. Go to the Rheumatology activity and complete the homunculus joint exam.  Investigation: No additional findings.  Imaging: No results found.  Recent Labs: Lab Results  Component Value Date   WBC 9.8 08/11/2018   HGB 14.3 08/11/2018   PLT 344 08/11/2018   NA 140 08/24/2015   K 4.2  08/24/2015   CL 102 08/24/2015   CO2 28 08/24/2015   GLUCOSE 104 (H) 08/24/2015   BUN 8 08/24/2015   CREATININE 0.77 08/24/2015   BILITOT 0.5 08/25/2014   ALKPHOS 77 08/25/2014   AST 28 08/25/2014   ALT 25 08/25/2014   PROT 7.6 08/25/2014   ALBUMIN 4.8 08/25/2014   CALCIUM 9.3 08/24/2015   GFRAA >60 08/24/2015    Speciality Comments: No specialty comments available.  Procedures:  No procedures performed Allergies: Iodinated diagnostic agents; Nsaids; Prednisone; Propoxyphene; Morphine and related; Pantoprazole sodium; Acetaminophen; Benzonatate; Budesonide; Buprenorphine hcl; Ceftin [cefuroxime axetil]; Cefuroxime axetil; Cephalexin; Citric acid; Clindamycin; Codeine; Cortisone; Diazepam; Diclofenac; Diltiazem; Doxycycline; Esomeprazole; Hydrocodone; Hydrocodone-acetaminophen; Ibuprofen; Lidocaine; Loracarbef; Macrobid [nitrofurantoin monohyd macro]; Maxalt [rizatriptan]; Methylprednisolone acetate; Naproxen; Omeprazole; Other; Oxycodone; Oxycodone-acetaminophen; Oxycodone-acetaminophen; Penicillins; Phenazopyridine hcl; and Tomato       Assessment / Plan:     Visit Diagnoses: Primary osteoarthritis of both hands - Ultrasound negative for synovitis, autoimmune work up negative, natural antiinflammatories discussed at last visit: She has DIP synovial thickening.  She has no synovitis on exam.  She has complete first formation bilaterally.  She has been having increased discomfort in both hands and both wrist joints but no inflammation was noted.  She has been having paresthesias in the distribution of the median nerve bilaterally.  Ultrasound on 09/11/18 revealed that bilateral median nerves were WNL.  She reports she had a nerve conduction velocity study 1 month ago that revealed bilateral carpal tunnel.   DDD (degenerative disc disease), cervical - s/p fusion:  She has limited ROM of the C-spine.  She has no symptoms of radiculopathy at this time.   DDD (degenerative disc disease),  lumbar - s/p discetomy x3: Chronic pain.  She has no midline spinal tenderness in the lumbar region.  She has bilateral SI joint tenderness.    Myofascial pain: She has generalized hyperalgesia on exam.  She has been having increased myalgias in bilateral upper extremities.  She has tenderness at the deltoid insertion site bilaterally.  She has positive tender points on exam.  She has chronic fatigue and insomnia.  She was advised to stay active and exercise on a regular basis.  She was advised to follow up with PCP to discuss a referral to pain management for Shaw pain control.  Chronic pain syndrome: She was advised to discuss with her primary care referral to pain management.  Other medical conditions are listed as follows:   History of IBS  History of constipation  History of varicose veins  Chronic venous insufficiency  Coronary artery spasm (HCC)  Pituitary adenoma (HCC)  History of gastroesophageal reflux (GERD)  History of ovarian cyst   Orders: No orders of the defined types were placed in this encounter.  No orders of the defined types were placed in this encounter.    Follow-Up Instructions: Return if symptoms worsen or fail to improve, for Myofascial pain, DDD, Osteoarthritis.   Ofilia Neas, PA-C  Note - This record has been created using Dragon software.  Chart creation errors have been sought, but may not always  have been located. Such creation errors do not reflect on  the standard of medical care.

## 2018-12-08 ENCOUNTER — Encounter: Payer: Self-pay | Admitting: Physician Assistant

## 2018-12-08 ENCOUNTER — Ambulatory Visit: Payer: Medicaid Other | Admitting: Physician Assistant

## 2018-12-08 VITALS — BP 111/77 | HR 77 | Resp 13 | Ht 59.0 in | Wt 129.8 lb

## 2018-12-08 DIAGNOSIS — M19041 Primary osteoarthritis, right hand: Secondary | ICD-10-CM | POA: Diagnosis not present

## 2018-12-08 DIAGNOSIS — M5030 Other cervical disc degeneration, unspecified cervical region: Secondary | ICD-10-CM

## 2018-12-08 DIAGNOSIS — Z8679 Personal history of other diseases of the circulatory system: Secondary | ICD-10-CM

## 2018-12-08 DIAGNOSIS — G894 Chronic pain syndrome: Secondary | ICD-10-CM

## 2018-12-08 DIAGNOSIS — M7918 Myalgia, other site: Secondary | ICD-10-CM

## 2018-12-08 DIAGNOSIS — Z8742 Personal history of other diseases of the female genital tract: Secondary | ICD-10-CM

## 2018-12-08 DIAGNOSIS — I872 Venous insufficiency (chronic) (peripheral): Secondary | ICD-10-CM

## 2018-12-08 DIAGNOSIS — M19042 Primary osteoarthritis, left hand: Secondary | ICD-10-CM

## 2018-12-08 DIAGNOSIS — M5136 Other intervertebral disc degeneration, lumbar region: Secondary | ICD-10-CM

## 2018-12-08 DIAGNOSIS — I201 Angina pectoris with documented spasm: Secondary | ICD-10-CM

## 2018-12-08 DIAGNOSIS — Z8719 Personal history of other diseases of the digestive system: Secondary | ICD-10-CM

## 2018-12-08 DIAGNOSIS — D352 Benign neoplasm of pituitary gland: Secondary | ICD-10-CM

## 2019-03-12 ENCOUNTER — Ambulatory Visit: Payer: Medicaid Other | Admitting: Rheumatology

## 2019-03-31 ENCOUNTER — Ambulatory Visit
Admission: RE | Admit: 2019-03-31 | Discharge: 2019-03-31 | Disposition: A | Discharge status: Home / Self Care | Payer: Medicaid Other | Source: Ambulatory Visit | Attending: Family Medicine | Admitting: Family Medicine

## 2019-03-31 ENCOUNTER — Other Ambulatory Visit: Payer: Self-pay

## 2019-03-31 ENCOUNTER — Other Ambulatory Visit: Payer: Self-pay | Admitting: Family Medicine

## 2019-03-31 DIAGNOSIS — R102 Pelvic and perineal pain: Secondary | ICD-10-CM

## 2019-03-31 DIAGNOSIS — R310 Gross hematuria: Secondary | ICD-10-CM

## 2019-05-18 NOTE — Progress Notes (Signed)
Office Visit Note  Patient: Virginia Shaw             Date of Birth: 06-07-1966           MRN: [id number]             PCP: [name], MD Referring: [name], MD Visit Date: 06/01/2019 Occupation: @GUAROCC @  Subjective:  Pain in multiple joints   History of Present Illness: Virginia Shaw is a 53 y.o. female with history of myofascial pain syndrome, osteoarthritis, and DDD.  She continues to have generalized muscle aches and muscle tenderness due to myofascial pain syndrome.  She states that recently her fatigue has been worsening.  She is also been having difficulty sleeping at night due to nocturnal pain in bilateral hands.  He has been experiencing pain in his pain and stiffness in both hands.  She is also had intermittent swelling.  She says that at times she is gripping something for a long time her hands will cramp and become very stiff.  She is also been having increased neck and lower back pain.  She had a C-spine and lumbar fusion performed by Dr. Trenton Gammon in the past.  She has not followed up with Dr. Trenton Gammon recently.  She has been taking aspirin 81 mg by mouth as needed for pain relief.  She is followed by Dr. Royce Macadamia her nephrologist for intermittent hematuria.  Patient states that the hematuria is getting worse.  She will be scheduled for a kidney biopsy in the future.  Patient reports that she experiences hemoptysis once monthly for the last 1 year.  And was referred to a pulmonologist on Friday but has not set up an appointment yet.  She denies any history of joint swelling or joint pain.  She denies any history of rash.    Activities of Daily Living:  Patient reports morning stiffness for 15 minutes.   Patient Reports nocturnal pain.  Difficulty dressing/grooming: Denies Difficulty climbing stairs: Denies Difficulty getting out of chair: Reports Difficulty using hands for taps, buttons, cutlery, and/or writing: Reports  Review of Systems   Constitutional: Positive for fatigue.  HENT: Negative for mouth sores, mouth dryness and nose dryness.   Eyes: Positive for dryness. Negative for pain and visual disturbance.  Respiratory: Positive for hemoptysis (once monthly). Negative for cough, shortness of breath and difficulty breathing.   Cardiovascular: Negative for chest pain, palpitations, hypertension and swelling in legs/feet.  Gastrointestinal: Negative for blood in stool, constipation and diarrhea.  Endocrine: Negative for increased urination.  Genitourinary: Positive for painful urination and hematuria.  Musculoskeletal: Positive for arthralgias, joint pain, joint swelling, myalgias, morning stiffness, muscle tenderness and myalgias. Negative for muscle weakness.  Skin: Negative for color change, pallor, rash, hair loss, nodules/bumps, skin tightness, ulcers and sensitivity to sunlight.  Allergic/Immunologic: Negative for susceptible to infections.  Neurological: Negative for dizziness, numbness, headaches and weakness.  Hematological: Negative for swollen glands.  Psychiatric/Behavioral: Negative for depressed mood and sleep disturbance. The patient is not nervous/anxious.     PMFS History:  Patient Active Problem List   Diagnosis Date Noted  . DDD (degenerative disc disease), cervical 08/11/2018  . DDD (degenerative disc disease), lumbar 08/11/2018  . Somatic complaints, multiple 03/14/2017  . Chronic pain syndrome 08/25/2014  . Irritable bowel syndrome (IBS) 01/12/2014  . 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 Chronic abdominal pain 01/11/2014  . Chronic venous insufficiency 10/30/2013  . Varicose veins 10/30/2013  . Chest tightness 03/25/2012  . PITUITARY ADENOMA,  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  . RECTAL BLEEDING 08/26/2008  . UTI'S, RECURRENT 08/26/2008    Past Medical History:  Diagnosis Date  . Abnormal involuntary movements(781.0)   . Allergic rhinitis, cause unspecified   . Anxiety state, unspecified   . Cervical  spondylosis without myelopathy   . Disturbance of skin sensation   . Disturbance of skin sensation   . GERD (gastroesophageal reflux disease)   . History of chicken pox   . Irritable bowel syndrome   . Lumbago   . Myalgia and myositis, unspecified   . Osteoporosis   . Other and unspecified angina pectoris   . Other malaise and fatigue   . 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 Somatization disorder   . Spasm of muscle   . Systemic sclerosis ([place])   . Unspecified hereditary and idiopathic peripheral neuropathy     Family History  Problem Relation Age of Onset  . Hypertension Father   . Hyperlipidemia Father   . Diabetes Father   . Stroke Father   . Cancer Maternal Uncle        lung  . Cancer Paternal Aunt        lung  . Migraines Mother   . Heart attack Paternal Grandmother   . Heart attack Paternal Grandfather   . Hypertension Paternal Grandfather   . Migraines Sister    Past Surgical History:  Procedure Laterality Date  . ABDOMINAL HYSTERECTOMY  1997  . arnold chiari malformation  2013  . BACK SURGERY  2001, 2011,2013   Lumbar x 2  . BLADDER SURGERY  2011  . CARDIAC CATHETERIZATION    . CATARACT EXTRACTION    . CERVICAL SPINE SURGERY  2013  . CHOLECYSTECTOMY    . COLONOSCOPY  2009   "Normal" Dr Sharlett Iles  . COLONOSCOPY  2002   "Normal" Dr Freddrick March  . COLONOSCOPY  ~2013   Dr Ferdinand Lango.  "Chest pain with the prep" = aborted  . CRANIECTOMY SUBOCCIPITAL W/ CERVICAL LAMINECTOMY / CHIARI  2012   Dr Annette Stable, Chiari decompression with suboccipital craniectomy and  . CYSTOCELE REPAIR  2002   Dr Gaetano Net  . OOPHORECTOMY  1999, 2000  . PITUITARY EXCISION  2010   pituitary cyst removed  . RECTOCELE REPAIR  2002   Dr. Gaetano Net  . tear duct tubes  2005  . TUBAL LIGATION     Social History   Social History Narrative   Lives at home with children. ( 2 daughters)   Regular exercise-yes   Caffeine Use-no   GED   Not Working outside home    There is no immunization  history on file for this patient.   Objective: Vital Signs: BP (!) 86/64 (BP Location: Left Arm, Patient Position: Sitting, Cuff Size: Small)   Pulse 84   Resp 12   Ht 4' 11.25" (1.505 m)   Wt 134 lb 6.4 oz (61 kg)   BMI 26.92 kg/m    Physical Exam Vitals signs and nursing note reviewed.  Constitutional:      Appearance: She is well-developed.  HENT:     Head: Normocephalic and atraumatic.  Eyes:     Conjunctiva/sclera: Conjunctivae normal.  Neck:     Musculoskeletal: Normal range of motion.  Cardiovascular:     Rate and Rhythm: Normal rate and regular rhythm.     Heart sounds: Normal heart sounds.  Pulmonary:     Effort: Pulmonary effort is normal.     Breath sounds: Normal breath sounds.  Abdominal:  General: Bowel sounds are normal.     Palpations: Abdomen is soft.  Lymphadenopathy:     Cervical: No cervical adenopathy.  Skin:    General: Skin is warm and dry.     Capillary Refill: Capillary refill takes less than 2 seconds.  Neurological:     Mental Status: She is alert and oriented to person, place, and time.  Psychiatric:        Behavior: Behavior normal.      Musculoskeletal Exam: Generalized hyperalgesia and positive tender points. C-spine limited ROM.  Midline spinal tenderness in the lumbar region.  Shoulder joints, elbow joints, wrist joints, MCPs, PIPs, and DIPs good ROM with no synovitis.  She has tenderness of the right 2nd, 3rd, and 4th MCP joints.  Limited ROM of both hip joints with discomfort.  Knee joints have good ROM with no warmth or effusion.  No tenderness or swelling of ankle joints.   CDAI Exam: CDAI Score: - Patient Global: -; Provider Global: - Swollen: -; Tender: - Joint Exam   No joint exam has been documented for this visit   There is currently no information documented on the homunculus. Go to the Rheumatology activity and complete the homunculus joint exam.  Investigation: No additional findings.  Imaging: No results found.   Recent Labs: Lab Results  Component Value Date   WBC 9.8 08/11/2018   HGB 14.3 08/11/2018   PLT 344 08/11/2018   NA 140 08/24/2015   K 4.2 08/24/2015   CL 102 08/24/2015   CO2 28 08/24/2015   GLUCOSE 104 (H) 08/24/2015   BUN 8 08/24/2015   CREATININE 0.77 08/24/2015   BILITOT 0.5 08/25/2014   ALKPHOS 77 08/25/2014   AST 28 08/25/2014   ALT 25 08/25/2014   PROT 7.6 08/25/2014   ALBUMIN 4.8 08/25/2014   CALCIUM 9.3 08/24/2015   GFRAA >60 08/24/2015  Chart review, July 25, 2018 chest x-ray was normal.  Speciality Comments: No specialty comments available.  Procedures:  No procedures performed Allergies: Iodinated diagnostic agents, Nsaids, Prednisone, Propoxyphene, Morphine and related, Pantoprazole sodium, Acetaminophen, Benzonatate, Budesonide, Buprenorphine hcl, Ceftin [cefuroxime axetil], Cefuroxime axetil, Cephalexin, Citric acid, Clindamycin, Codeine, Cortisone, Diazepam, Diclofenac, Diltiazem, Doxycycline, Esomeprazole, Hydrocodone, Hydrocodone-acetaminophen, Ibuprofen, Lidocaine, Loracarbef, Macrobid [nitrofurantoin monohyd macro], Maxalt [rizatriptan], Methylprednisolone acetate, Naproxen, Omeprazole, Other, Oxycodone, Oxycodone-acetaminophen, Oxycodone-acetaminophen, Penicillins, Phenazopyridine hcl, and Tomato   Assessment / Plan:     Visit Diagnoses: Primary osteoarthritis of both hands -clinical findings are consistent with osteoarthritis.  Pain in both hands -she complains of increased pain in her hands but no synovitis was noted.  Plan: XR Hand 2 View Left, XR Hand 2 View Right, x-rays were consistent with mild osteoarthritis.  No findings of inflammatory arthritis were noted.  Neck pain -she complains of increased neck pain.  She states the neck pain causes headaches.  She is seen Dr. Trenton Gammon in the past.  She is also followed by neurology for headaches.   DDD (degenerative disc disease), cervical -patient has been followed by Dr. Trenton Gammon.  As she was having  increased neck pain we obtain x-ray of her cervical spine.  Plan: XR Cervical Spine 2 or 3 views, C5-6 and C6-7 fusion was noted.  Chronic midline low back pain without sciatica -she complains of increased lower back pain and is concerned about that his disc disease is getting Plan: XR Lumbar Spine 2-3 Views, L4-5 and L5-S1 narrowing was noted.  Mild facet joint arthropathy was noted.  DDD (degenerative disc disease), lumbar -patient complains of  localized lower back pain.  She denies any radiculopathy.  She believes her disc disease in her lower back is getting worse.  Plan: XR Lumbar Spine 2-3 Views,   Myofascial pain -patient has longstanding history of myofascial pain.  She continues to have generalized pain and discomfort.  Chronic pain syndrome   Hematuria, unspecified type -patient states that she has been followed by Dr. Royce Macadamia for many years for hematuria.  Dr. 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  She is planning to schedule a renal biopsy.  According to patient she has not decided if she wants to have it.  I left a message for Dr. Royce Macadamia to call me back but I did not receive a phone call back from her.   Hemoptysis -patient gives history of mild hemoptysis for the last 1 year.  She states the only few specks in her spit which happens once a month.  She has an appointment pending with the pulmonologist.  I reviewed her chart and there was a chest x-ray from July 25, 2018 which was normal.  Other medical problems are listed as follows:  History of IBS   History of varicose veins   Chronic venous insufficiency -  Coronary artery spasm (HCC)   Pituitary adenoma ([place])  History of gastroesophageal reflux (GERD)  History of ovarian cyst   Orders: Orders Placed This Encounter  Procedures  . XR Hand 2 View Left  . XR Hand 2 View Right  . XR Cervical Spine 2 or 3 views  . XR Lumbar Spine 2-3 Views   No orders of the defined  types were placed in this encounter.   Face-to-face time spent with patient was 35 minutes. Greater than 50% of time was spent in counseling and coordination of care.  Follow-Up Instructions: Return for Osteoarthritis, DDD, Myofascial pain .   Bo Merino, MD   June 02 2019 record review from [facility]; Mar 27, 2019 UA showed large leukocytes, greater than 300 mg/dL protein a large amount of blood.  According to the notes from Dr. Royce Macadamia patient had PR-3 antibody positive twice.  (C-ANCA, p-ANCA and MPO antibodies were negative) she was also treated for E. coli UTI.  Dr. Royce Macadamia also discussed possible renal biopsy.  Patient has history of chronic hematuria and had been seen by urologist.  Note - This record has been created using [employer].  Chart creation errors have been sought, but may not always  have been located. Such creation errors do not reflect on  the standard of medical care.

## 2019-05-29 ENCOUNTER — Telehealth: Payer: Self-pay | Admitting: Physician Assistant

## 2019-05-29 NOTE — Telephone Encounter (Signed)
I received a call from Dr. Harrie Jeans regarding our mutual patient Virginia Shaw.  According to Dr. Royce Macadamia the patient has been followed at [facility] for management of hematuria.  She had a CT of the abdomen/pelvis on 03/31/19 which was normal. According to Dr. Royce Macadamia, the patient has had normal kidney function but recent lab results revealed anti-pr3 ANCA positivity. She has not had any proteinuria according to Dr. Royce Macadamia, but she considering proceeding with a renal biopsy.  The patient has reported intermittent hemoptysis once monthly, so she will be referred to pulmonology. She has an appointment with Dr. Estanislado Pandy on 06/01/19.   Dr. Estanislado Pandy is out of the office today, but I called to update her on the current situation.  She will call Dr. Royce Macadamia on Monday to further discuss.   Hazel Sams, PA-C

## 2019-06-01 ENCOUNTER — Ambulatory Visit: Payer: Self-pay

## 2019-06-01 ENCOUNTER — Ambulatory Visit (INDEPENDENT_AMBULATORY_CARE_PROVIDER_SITE_OTHER): Payer: Medicaid Other | Admitting: Rheumatology

## 2019-06-01 ENCOUNTER — Ambulatory Visit (INDEPENDENT_AMBULATORY_CARE_PROVIDER_SITE_OTHER): Payer: Medicaid Other

## 2019-06-01 ENCOUNTER — Other Ambulatory Visit: Payer: Self-pay

## 2019-06-01 ENCOUNTER — Encounter: Payer: Self-pay | Admitting: Physician Assistant

## 2019-06-01 VITALS — BP 86/64 | HR 84 | Resp 12 | Ht 59.25 in | Wt 134.4 lb

## 2019-06-01 DIAGNOSIS — M5136 Other intervertebral disc degeneration, lumbar region: Secondary | ICD-10-CM | POA: Diagnosis not present

## 2019-06-01 DIAGNOSIS — M19041 Primary osteoarthritis, right hand: Secondary | ICD-10-CM

## 2019-06-01 DIAGNOSIS — M545 Low back pain, unspecified: Secondary | ICD-10-CM

## 2019-06-01 DIAGNOSIS — M542 Cervicalgia: Secondary | ICD-10-CM

## 2019-06-01 DIAGNOSIS — M79642 Pain in left hand: Secondary | ICD-10-CM | POA: Diagnosis not present

## 2019-06-01 DIAGNOSIS — R319 Hematuria, unspecified: Secondary | ICD-10-CM

## 2019-06-01 DIAGNOSIS — M79641 Pain in right hand: Secondary | ICD-10-CM

## 2019-06-01 DIAGNOSIS — G8929 Other chronic pain: Secondary | ICD-10-CM

## 2019-06-01 DIAGNOSIS — M5030 Other cervical disc degeneration, unspecified cervical region: Secondary | ICD-10-CM

## 2019-06-01 DIAGNOSIS — Z8742 Personal history of other diseases of the female genital tract: Secondary | ICD-10-CM

## 2019-06-01 DIAGNOSIS — I201 Angina pectoris with documented spasm: Secondary | ICD-10-CM

## 2019-06-01 DIAGNOSIS — Z8719 Personal history of other diseases of the digestive system: Secondary | ICD-10-CM

## 2019-06-01 DIAGNOSIS — M19042 Primary osteoarthritis, left hand: Secondary | ICD-10-CM

## 2019-06-01 DIAGNOSIS — R042 Hemoptysis: Secondary | ICD-10-CM

## 2019-06-01 DIAGNOSIS — Z8679 Personal history of other diseases of the circulatory system: Secondary | ICD-10-CM

## 2019-06-01 DIAGNOSIS — D352 Benign neoplasm of pituitary gland: Secondary | ICD-10-CM

## 2019-06-01 DIAGNOSIS — G894 Chronic pain syndrome: Secondary | ICD-10-CM

## 2019-06-01 DIAGNOSIS — I872 Venous insufficiency (chronic) (peripheral): Secondary | ICD-10-CM

## 2019-06-01 DIAGNOSIS — M7918 Myalgia, other site: Secondary | ICD-10-CM

## 2019-06-16 NOTE — Progress Notes (Signed)
Office Visit Note  Patient: Virginia Shaw             Date of Birth: 1965-12-02           MRN: [id number]             PCP: [name], MD Referring: [name], MD Visit Date: 06/30/2019 Occupation: @GUAROCC @  Subjective:  Increased pain in both hands.   History of Present Illness: Virginia Shaw is a 53 y.o. female with history of osteoarthritis, degenerative disc disease and myofascial pain syndrome.  She states recently she has been experiencing some discomfort in her bilateral wrist and bilateral hands.  She has noticed some redness over her wrist and her knuckles.  She continues to have some generalized pain from degenerative disc disease and myofascial pain syndrome.  She also has history of proteinuria and hematuria for which she has been seeing Dr. Royce Macadamia.  She had positive PR-3 antibody.  She was evaluated by Dr. Vaughan Browner this morning.  A CT chest is pending at this point.  Patient states she also has an appointment coming up with her neurologist Dr. Sabra Heck with [facility].  Activities of Daily Living:  Patient reports morning stiffness for 10-30 minutes.   Patient Reports nocturnal pain.  Difficulty dressing/grooming: Denies Difficulty climbing stairs: Reports Difficulty getting out of chair: Reports Difficulty using hands for taps, buttons, cutlery, and/or writing: Reports  Review of Systems  Constitutional: Negative for fatigue.  HENT: Positive for mouth dryness. Negative for mouth sores and nose dryness.   Eyes: Positive for dryness. Negative for pain and itching.  Respiratory: Negative for wheezing and difficulty breathing.   Cardiovascular: Negative for chest pain and palpitations.  Gastrointestinal: Negative for abdominal pain, blood in stool, constipation and diarrhea.  Endocrine: Negative for increased urination.  Genitourinary: Positive for painful urination. Negative for difficulty urinating.  Musculoskeletal: Positive for arthralgias, joint pain,  joint swelling and morning stiffness.  Skin: Positive for redness. Negative for rash and hair loss.  Allergic/Immunologic: Negative for susceptible to infections.  Neurological: Positive for headaches. Negative for dizziness, numbness, memory loss and weakness.  Hematological: Negative for bruising/bleeding tendency.  Psychiatric/Behavioral: Positive for sleep disturbance. Negative for confusion. The patient is not nervous/anxious.     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[place]) 01/11/2014   Chronic abdominal pain 01/11/2014   Chronic venous insufficiency 10/30/2013   Varicose veins 10/30/2013   Chest tightness 03/25/2012   PITUITARY ADENOMA, 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   RECTAL BLEEDING 08/26/2008   UTI'S, RECURRENT 08/26/2008    Past Medical History:  Diagnosis Date   Abnormal involuntary movements(781.0)    Allergic rhinitis, cause unspecified    Anxiety state, unspecified    Cervical spondylosis without myelopathy    Disturbance of skin sensation    Disturbance of skin sensation    GERD (gastroesophageal reflux disease)    History of chicken pox    Irritable bowel syndrome    Lumbago    Myalgia and myositis, unspecified    Osteoporosis    Other and unspecified angina pectoris    Other malaise and fatigue    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[place]) 01/11/2014   Somatization disorder    Spasm of muscle    Systemic sclerosis ([place])  Unspecified hereditary and idiopathic peripheral neuropathy     Family History  Problem Relation Age of Onset   Hypertension Father    Hyperlipidemia Father    Diabetes Father    Stroke Father    Cancer Maternal Uncle        lung   Cancer Paternal  Aunt        lung   Migraines Mother    Heart attack Paternal Grandmother    Heart attack Paternal Grandfather    Hypertension Paternal Grandfather    Migraines Sister    Past Surgical History:  Procedure Laterality Date   ABDOMINAL HYSTERECTOMY  1997   arnold chiari malformation  2013   BACK SURGERY  2001, 2011,2013   Lumbar x 2   BLADDER SURGERY  2011   CARDIAC CATHETERIZATION     CATARACT EXTRACTION     CERVICAL SPINE SURGERY  2013   CHOLECYSTECTOMY     COLONOSCOPY  2009   "Normal" Dr Sharlett Iles   COLONOSCOPY  2002   "Normal" Dr Freddrick March   COLONOSCOPY  ~2013   Dr Ferdinand Lango.  "Chest pain with the prep" = aborted   CRANIECTOMY SUBOCCIPITAL W/ CERVICAL LAMINECTOMY / CHIARI  2012   Dr Annette Stable, Chiari decompression with suboccipital craniectomy and   CYSTOCELE REPAIR  2002   Dr Gaetano Net   OOPHORECTOMY  1999, [place]  2010   pituitary cyst removed   RECTOCELE REPAIR  2002   Dr. Gaetano Net   tear duct tubes  2005   TUBAL LIGATION     Social History   Social History Narrative   Lives at home with children. ( 2 daughters)   Regular exercise-yes   Caffeine Use-no   GED   Not Working outside home    There is no immunization history on file for this patient.   Objective: Vital Signs: BP 93/65 (BP Location: Right Arm, Patient Position: Sitting, Cuff Size: Normal)    Pulse 70    Resp 12    Ht 4' 11.75" (1.518 m)    Wt 134 lb 6.4 oz (61 kg)    BMI 26.47 kg/m    Physical Exam Vitals signs and nursing note reviewed.  Constitutional:      Appearance: She is well-developed.  HENT:     Head: Normocephalic and atraumatic.  Eyes:     Conjunctiva/sclera: Conjunctivae normal.  Neck:     Musculoskeletal: Normal range of motion.  Cardiovascular:     Rate and Rhythm: Normal rate and regular rhythm.     Heart sounds: Normal heart sounds.  Pulmonary:     Effort: Pulmonary effort is normal.     Breath sounds: Normal breath sounds.  Abdominal:       General: Bowel sounds are normal.     Palpations: Abdomen is soft.  Lymphadenopathy:     Cervical: No cervical adenopathy.  Skin:    General: Skin is warm and dry.     Capillary Refill: Capillary refill takes less than 2 seconds.  Neurological:     Mental Status: She is alert and oriented to person, place, and time.  Psychiatric:        Behavior: Behavior normal.      Musculoskeletal Exam: C-spine and lumbar spine with limited range of motion.  Shoulder joints, elbow joints, wrist joints, MCPs PIPs and DIPs with good range of motion.  She has mild DIP and PIP thickening with no synovitis.  Hip joints, knee joints, ankles and MTPs and PIPs  been good range of motion.  No synovitis was noted on examination.  No rash was noted on examination.  CDAI Exam: CDAI Score: -- Patient Global: --; Provider Global: -- Swollen: --; Tender: -- Joint Exam   No joint exam has been documented for this visit   There is currently no information documented on the homunculus. Go to the Rheumatology activity and complete the homunculus joint exam.  Investigation: No additional findings.  Imaging: Xr Cervical Spine 2 Or 3 Views  Result Date: 06/01/2019 C5-6 and C6-7 fusion was noted.  None of the other disc spaces showed significant narrowing. Impression: Degenerative disc disease of cervical spine with C5-6 and C6-7 fusion.  Xr Hand 2 View Left  Result Date: 06/01/2019 Mild PIP and DIP narrowing was noted.  No MCP, intercarpal radiocarpal joint space narrowing was noted.  No erosive changes were noted. 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  Xr Hand 2 View Right  Result Date: 06/01/2019 Mild PIP and DIP narrowing was noted.  No MCP, intercarpal radiocarpal joint space narrowing was noted.  No erosive changes were noted. 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  Xr Lumbar Spine 2-3 Views  Result Date:  06/01/2019 L4-5 and L5-S1 narrowing was noted.  Facet joint arthropathy was noted.  No vertebral fractures were noted. Impression: These findings are consistent with DDD of lumbar spine and facet joint arthropathy.   Recent Labs: Lab Results  Component Value Date   WBC 9.8 08/11/2018   HGB 14.3 08/11/2018   PLT 344 08/11/2018   NA 140 08/24/2015   K 4.2 08/24/2015   CL 102 08/24/2015   CO2 28 08/24/2015   GLUCOSE 104 (H) 08/24/2015   BUN 8 08/24/2015   CREATININE 0.77 08/24/2015   BILITOT 0.5 08/25/2014   ALKPHOS 77 08/25/2014   AST 28 08/25/2014   ALT 25 08/25/2014   PROT 7.6 08/25/2014   ALBUMIN 4.8 08/25/2014   CALCIUM 9.3 08/24/2015   GFRAA >60 08/24/2015    Speciality Comments: No specialty comments available.  Procedures:  No procedures performed Allergies: Iodinated diagnostic agents, Nsaids, Prednisone, Propoxyphene, Morphine and related, Pantoprazole sodium, Acetaminophen, Benzonatate, Budesonide, Buprenorphine hcl, Ceftin [cefuroxime axetil], Cefuroxime axetil, Cephalexin, Citric acid, Clindamycin, Codeine, Cortisone, Diazepam, Diclofenac, Diltiazem, Doxycycline, Esomeprazole, Hydrocodone, Hydrocodone-acetaminophen, Ibuprofen, Lidocaine, Loracarbef, Macrobid [nitrofurantoin monohyd macro], Maxalt [rizatriptan], Methylprednisolone acetate, Naproxen, Omeprazole, Other, Oxycodone, Oxycodone-acetaminophen, Oxycodone-acetaminophen, Penicillins, Phenazopyridine hcl, and Tomato   Assessment / Plan:     Visit Diagnoses:  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  Although she has noticed only few streaks in her sputum.  She had evaluation by Dr. Vaughan Browner this morning.  The CT chest is pending at this point.  She was evaluated by nephrology for hematuria and proteinuria.  Dr. Royce Macadamia is planning possible renal biopsy in the future.  Primary osteoarthritis of both hands-clinical findings are consistent with DIP and PIP thickening.  She continues to have  some discomfort in her hands.  DDD (degenerative disc disease), cervical - patient has been followed by Dr. Trenton Gammon.  C5-6 and C6-7 fusion was noted.  Patient has limited range of motion and discomfort.  Chronic midline low back pain without sciatica - L4-5 and L5-S1 narrowing was noted.  Mild facet joint arthropathy was noted.  She continues to have a lot of discomfort in her lower back.  DDD (degenerative disc disease), lumbar  Myofascial pain-she has generalized pain and discomfort all over.  Chronic pain syndrome  Hematuria, unspecified type - patient states that she has been followed by Dr. Royce Macadamia for many years for hematuria.  Hemoptysis - patient gives history of mild hemoptysis for the last 1 year.   Other medical problems are listed as follows:  History of ovarian cyst  Pituitary adenoma ([place])  Chronic venous insufficiency  Coronary artery spasm ([facility])  History of IBS  History of varicose veins  History of gastroesophageal reflux (GERD)  Orders: No orders of the defined types were placed in this encounter.  No orders of the defined types were placed in this encounter.   Face-to-face time spent with patient was 25 minutes. Greater than 50% of time was spent in counseling and coordination of care.  Follow-Up Instructions: Return in about 2 months (around 08/30/2019) for Osteoarthritis, MFPS.   Bo Merino, MD  Note - This record has been created using Editor, commissioning.  Chart creation errors have been sought, but may not always  have been located. Such creation errors do not reflect on  the standard of medical care.

## 2019-06-19 ENCOUNTER — Encounter: Payer: Self-pay | Admitting: Pulmonary Disease

## 2019-06-30 ENCOUNTER — Ambulatory Visit (INDEPENDENT_AMBULATORY_CARE_PROVIDER_SITE_OTHER): Payer: Medicaid Other | Admitting: Pulmonary Disease

## 2019-06-30 ENCOUNTER — Encounter: Payer: Self-pay | Admitting: Pulmonary Disease

## 2019-06-30 ENCOUNTER — Encounter: Payer: Self-pay | Admitting: Rheumatology

## 2019-06-30 ENCOUNTER — Ambulatory Visit (INDEPENDENT_AMBULATORY_CARE_PROVIDER_SITE_OTHER): Payer: Medicaid Other | Admitting: Rheumatology

## 2019-06-30 ENCOUNTER — Other Ambulatory Visit: Payer: Self-pay

## 2019-06-30 VITALS — BP 93/65 | HR 70 | Resp 12 | Ht 59.75 in | Wt 134.4 lb

## 2019-06-30 VITALS — BP 110/64 | HR 72 | Temp 97.0°F | Ht 61.0 in | Wt 134.0 lb

## 2019-06-30 DIAGNOSIS — I872 Venous insufficiency (chronic) (peripheral): Secondary | ICD-10-CM

## 2019-06-30 DIAGNOSIS — M51369 Other intervertebral disc degeneration, lumbar region without mention of lumbar back pain or lower extremity pain: Secondary | ICD-10-CM

## 2019-06-30 DIAGNOSIS — M5030 Other cervical disc degeneration, unspecified cervical region: Secondary | ICD-10-CM

## 2019-06-30 DIAGNOSIS — R319 Hematuria, unspecified: Secondary | ICD-10-CM

## 2019-06-30 DIAGNOSIS — G894 Chronic pain syndrome: Secondary | ICD-10-CM

## 2019-06-30 DIAGNOSIS — Z8742 Personal history of other diseases of the female genital tract: Secondary | ICD-10-CM

## 2019-06-30 DIAGNOSIS — M5136 Other intervertebral disc degeneration, lumbar region: Secondary | ICD-10-CM | POA: Diagnosis not present

## 2019-06-30 DIAGNOSIS — Z8679 Personal history of other diseases of the circulatory system: Secondary | ICD-10-CM

## 2019-06-30 DIAGNOSIS — M545 Low back pain: Secondary | ICD-10-CM

## 2019-06-30 DIAGNOSIS — R042 Hemoptysis: Secondary | ICD-10-CM

## 2019-06-30 DIAGNOSIS — I201 Angina pectoris with documented spasm: Secondary | ICD-10-CM

## 2019-06-30 DIAGNOSIS — G8929 Other chronic pain: Secondary | ICD-10-CM

## 2019-06-30 DIAGNOSIS — M19041 Primary osteoarthritis, right hand: Secondary | ICD-10-CM

## 2019-06-30 DIAGNOSIS — M19042 Primary osteoarthritis, left hand: Secondary | ICD-10-CM

## 2019-06-30 DIAGNOSIS — Z8719 Personal history of other diseases of the digestive system: Secondary | ICD-10-CM

## 2019-06-30 DIAGNOSIS — M7918 Myalgia, other site: Secondary | ICD-10-CM

## 2019-06-30 DIAGNOSIS — D352 Benign neoplasm of pituitary gland: Secondary | ICD-10-CM

## 2019-06-30 NOTE — Patient Instructions (Signed)
We will schedule you for high-resolution CT for evaluation of your lung Follow-up in 1 to 2 weeks for review and plan for next steps I will be in touch with Dr. Royce Macadamia your nephrologist about the results and coordinate plans

## 2019-06-30 NOTE — Progress Notes (Signed)
Virginia Shaw    [id number]    Mar 17, 1966  Primary Care Physician:Smith, Hal Hope, MD  Referring Physician: [name], [place],   [id number]  Chief complaint: Consult for blood in sputum  HPI: 53 year old with history of angina, osteoarthritis, endometriosis, IBS, chronic neck and back pain sent here for evaluation of hemoptysis.  She has had intermittent streaks of blood in the sputum for the past year.  She had an episode of blood with clots in the sputum around May of this year.  Also noted to have proteinuria and hematuria.  Follows with urology at [facility].  She has been evaluated by Dr. Harrie Jeans, nephrology.  Work-up as noted below with elevated anti-proteinase 3 antibody.  A kidney biopsy is being considered but patient wants to defer for now until the lungs are evaluated.  She has not had any more episodes of blood in sputum since May.  Blood is not associated with cough.  Denies any dyspnea, sputum production, fevers, chills.  History noted for sinusitis.  She has had nasal surgery in the past.  Also has intermittent episodes of GERD.  She has chronic joint pain which is thought to be secondary to osteoarthritis and degenerative disc disease.  Follows with Dr. Estanislado Pandy.  She does not take NSAIDs due to side effects.  Pets: Has a dog.  No cats, birds, farm animals Occupation: Used to work at a [employer] until 2007.  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  No current ongoing exposures.  Denies any mold, hot tub, Jacuzzi, humidifier Smoking history: Never smoker Travel history: No significant travel history Relevant family history: There is significant family history of lung disease.  Her aunt had a lung transplant.  She is not clear why the transplant was needed.. Several family members have lung cancer, mesothelioma, COPD.  Outpatient  Encounter Medications as of 06/30/2019  Medication Sig  . amLODipine (NORVASC) 5 MG tablet Take 5 mg by mouth 2 (two) times daily. Pt. Takes 1 tablet in the morning and a half at night.  Marland Kitchen aspirin 81 MG chewable tablet Chew 81 mg by mouth daily.   . isosorbide mononitrate (ISMO,MONOKET) 20 MG tablet Take 20 mg by mouth 2 (two) times daily at 10 AM and 5 PM.   . metoprolol tartrate (LOPRESSOR) 25 MG tablet TAKE 0.5 TABLETS (12.5 MG TOTAL) BY MOUTH TWO (2) TIMES A DAY.  . nitroGLYCERIN (NITROSTAT) 0.4 MG SL tablet Take by mouth.  Marland Kitchen OVER THE COUNTER MEDICATION    No facility-administered encounter medications on file as of 06/30/2019.     Allergies as of 06/30/2019 - Review Complete 06/30/2019  Allergen Reaction Noted  . Iodinated diagnostic agents Hives and Itching 12/10/2014  . Nsaids Other (See Comments) 03/25/2012  . Prednisone Other (See Comments) 11/27/2013  . Propoxyphene Other (See Comments) 01/29/2014  . Morphine and related Other (See Comments) 11/27/2013  . Pantoprazole sodium Other (See Comments) 03/25/2012  . Acetaminophen  08/25/2014  . Benzonatate  08/25/2014  . Budesonide Other (See Comments) 08/25/2014  . Buprenorphine hcl Other (See Comments) 01/26/2015  . Ceftin [cefuroxime axetil]  03/25/2012  . Cefuroxime axetil    . Cephalexin  03/25/2012  . Citric acid Hives and Swelling 08/25/2014  . Clindamycin Other (See Comments) 01/26/2015  . Codeine    . Cortisone Other (See Comments) 04/11/2010  .  Diazepam  08/25/2014  . Diclofenac Other (See Comments) 08/30/2015  . Diltiazem  03/25/2012  . Doxycycline  03/14/2017  . Esomeprazole  08/25/2014  . Hydrocodone  08/25/2014  . Hydrocodone-acetaminophen  08/25/2014  . Ibuprofen    . Lidocaine Other (See Comments) 04/11/2010  . Loracarbef Other (See Comments) 12/04/2010  . Macrobid [nitrofurantoin monohyd macro] Other (See Comments) 09/22/2012  . Maxalt [rizatriptan]  03/25/2012  . Methylprednisolone acetate Other (See  Comments) 04/11/2010  . Naproxen  08/25/2014  . Omeprazole  03/25/2012  . Other Other (See Comments) 12/04/2010  . Oxycodone  08/25/2014  . Oxycodone-acetaminophen  08/25/2014  . Oxycodone-acetaminophen  01/29/2014  . Penicillins    . Phenazopyridine hcl  03/25/2012  . Tomato Hives 12/12/2010    Past Medical History:  Diagnosis Date  . Abnormal involuntary movements(781.0)   . Allergic rhinitis, cause unspecified   . Anxiety state, unspecified   . Cervical spondylosis without myelopathy   . Disturbance of skin sensation   . Disturbance of skin sensation   . GERD (gastroesophageal reflux disease)   . History of chicken pox   . Irritable bowel syndrome   . Lumbago   . Myalgia and myositis, unspecified   . Osteoporosis   . Other and unspecified angina pectoris   . Other malaise and fatigue   . 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 Somatization disorder   . Spasm of muscle   . Systemic sclerosis ([place])   . Unspecified hereditary and idiopathic peripheral neuropathy     Past Surgical History:  Procedure Laterality Date  . ABDOMINAL HYSTERECTOMY  1997  . arnold chiari malformation  2013  . BACK SURGERY  2001, 2011,2013   Lumbar x 2  . BLADDER SURGERY  2011  . CARDIAC CATHETERIZATION    . CATARACT EXTRACTION    . CERVICAL SPINE SURGERY  2013  . CHOLECYSTECTOMY    . COLONOSCOPY  2009   "Normal" Dr Sharlett Iles  . COLONOSCOPY  2002   "Normal" Dr Freddrick March  . COLONOSCOPY  ~2013   Dr Ferdinand Lango.  "Chest pain with the prep" = aborted  . CRANIECTOMY SUBOCCIPITAL W/ CERVICAL LAMINECTOMY / CHIARI  2012   Dr Annette Stable, Chiari decompression with suboccipital craniectomy and  . CYSTOCELE REPAIR  2002   Dr Gaetano Net  . OOPHORECTOMY  1999, 2000  . PITUITARY EXCISION  2010   pituitary cyst removed  . RECTOCELE REPAIR  2002   Dr. Gaetano Net  . tear duct tubes  2005  . TUBAL LIGATION      Family History  Problem Relation Age of Onset  . Hypertension Father   . Hyperlipidemia Father   .  Diabetes Father   . Stroke Father   . Cancer Maternal Uncle        lung  . Cancer Paternal Aunt        lung  . Migraines Mother   . Heart attack Paternal Grandmother   . Heart attack Paternal Grandfather   . Hypertension Paternal Grandfather   . Migraines Sister     Social History   Socioeconomic History  . Marital status: Legally Separated    Spouse name: Not on file  . Number of children: 2  . Years of education: GED  . Highest education level: Not on file  Occupational History    Employer: UNEMPLOYED  Social Needs  . Financial resource strain: Not on file  . Food insecurity    Worry: Not on file    Inability: Not on file  .  Transportation needs    Medical: Not on file    Non-medical: Not on file  Tobacco Use  . Smoking status: Never Smoker  . Smokeless tobacco: Never Used  Substance and Sexual Activity  . Alcohol use: No  . Drug use: No  . Sexual activity: Never    Birth control/protection: Abstinence  Lifestyle  . Physical activity    Days per week: Not on file    Minutes per session: Not on file  . Stress: Not on file  Relationships  . Social Herbalist on phone: Not on file    Gets together: Not on file    Attends religious service: Not on file    Active member of club or organization: Not on file    Attends meetings of clubs or organizations: Not on file    Relationship status: Not on file  . Intimate partner violence    Fear of current or ex partner: Not on file    Emotionally abused: Not on file    Physically abused: Not on file    Forced sexual activity: Not on file  Other Topics Concern  . Not on file  Social History Narrative   Lives at home with children. ( 2 daughters)   Regular exercise-yes   Caffeine Use-no   GED   Not Working outside home    Review of systems: Review of Systems  Constitutional: Negative for fever and chills.  HENT: Negative.   Eyes: Negative for blurred vision.  Respiratory: as per HPI   Cardiovascular: Negative for chest pain and palpitations.  Gastrointestinal: Negative for vomiting, diarrhea, blood per rectum. Genitourinary: Negative for dysuria, urgency, frequency and hematuria.  Musculoskeletal: Negative for myalgias, back pain and joint pain.  Skin: Negative for itching and rash.  Neurological: Negative for dizziness, tremors, focal weakness, seizures and loss of consciousness.  Endo/Heme/Allergies: Negative for environmental allergies.  Psychiatric/Behavioral: Negative for depression, suicidal ideas and hallucinations.  All other systems reviewed and are negative.  Physical Exam: Blood pressure 110/64, pulse 72, temperature (!) 97 F (36.1 C), temperature source Temporal, height _0  (1.549 m), weight 134 lb (60.8 kg), SpO2 98 %. Gen:      No acute distress HEENT:  EOMI, sclera anicteric Neck:     No masses; no thyromegaly Lungs:    Clear to auscultation bilaterally; normal respiratory effort CV:         Regular rate and rhythm; no murmurs Abd:      + bowel sounds; soft, non-tender; no palpable masses, no distension Ext:    No edema; adequate peripheral perfusion Skin:      Warm and dry; no rash Neuro: alert and oriented x 3 Psych: normal mood and affect  Data Reviewed: Imaging: Chest x-ray 07/24/2018- no active cardiopulmonary disease. CT abdomen pelvis 03/01/2019- no acute abnormality in lower chest. I have reviewed the images personally.  Labs: From nephrology office 05/05/2019- Anti-proteinase 3 antibody-8.8 [0-3.5) MPO ab, c-ANCA, p-ANCA-negative Anti-GBM antibody-6 units C3-C4 complement-within normal limits ANA- negative  Metabolic panel 2/77/4128- BUN 17, creatinine 0.85 CBC--WBC 7.9, hemoglobin 14.3, platelets 379, eos 15%, absolute eosinophil count 1185 UA 05/05/2019-1+ occult blood, negative protein  Assessment:  Evaluation for blood in sputum Unclear if this is actually hemoptysis.  She has been worked up for hematuria with anti-PR-3  antibody. There is concern for Wegner's.  Noted to have elevated peripheral eosinophils hence EGPA is also on the differential though it would be unusual to have this with negative  ANCA  We will schedule for high-resolution CT for better evaluation of the lungs.  If abnormal we may need to consider a bronchoscopy with BAL.  She may ultimately need biopsy.  We can consider a lung biopsy if the CT is abnormal but it may be easier with less complications to get a kidney biopsy as to obtain adquate lung tissue she will need surgical VATS procedure.  Plan/Recommendations: - High res CT of the chest Regroup in 2 weeks to reevaluate  Marshell Garfinkel MD [facility] 06/30/2019, 9:29 AM  CC: [name], MD

## 2019-07-06 ENCOUNTER — Ambulatory Visit: Payer: Medicaid Other | Admitting: Rheumatology

## 2019-07-08 ENCOUNTER — Ambulatory Visit
Admission: RE | Admit: 2019-07-08 | Discharge: 2019-07-08 | Disposition: A | Discharge status: Home / Self Care | Payer: Medicaid Other | Source: Ambulatory Visit | Attending: Pulmonary Disease | Admitting: Pulmonary Disease

## 2019-07-08 DIAGNOSIS — R042 Hemoptysis: Secondary | ICD-10-CM

## 2019-07-09 ENCOUNTER — Ambulatory Visit: Payer: Medicaid Other | Admitting: Pulmonary Disease

## 2019-07-09 ENCOUNTER — Encounter: Payer: Self-pay | Admitting: Pulmonary Disease

## 2019-07-09 ENCOUNTER — Other Ambulatory Visit: Payer: Self-pay

## 2019-07-09 VITALS — BP 108/60 | HR 66 | Temp 98.3°F | Ht 59.0 in | Wt 133.8 lb

## 2019-07-09 DIAGNOSIS — R042 Hemoptysis: Secondary | ICD-10-CM

## 2019-07-09 NOTE — Patient Instructions (Signed)
I have reviewed your CT scan which does not show any evidence of interstitial lung disease or any source for the intermittent blood in the sputum At this point I would not recommend a lung biopsy or any other procedure We will continue to monitor your symptoms.  If the blood recurs then we may consider a procedure called a bronchoscopy Please give [place] a call if this issue recurs Otherwise I will see you as a routine visit in 6 months.

## 2019-07-09 NOTE — Progress Notes (Signed)
Virginia Shaw    [id number]    11/15/1965  Primary Care Physician:Smith, Virginia Hope, MD  Referring Physician: Carol Shaw, [place],  [place] [address]  Chief complaint: Follow up for blood in sputum  HPI: 53 year old with history of angina, osteoarthritis, endometriosis, IBS, chronic neck and back pain sent here for evaluation of hemoptysis.  She has had intermittent streaks of blood in the sputum for the past year.  She had an episode of blood with clots in the sputum around May of this year.  Also noted to have proteinuria and hematuria.  Follows with urology at [facility].  She has been evaluated by Dr. Harrie Shaw, nephrology.  Work-up as noted below with elevated anti-proteinase 3 antibody.    She has not had any more episodes of blood in sputum since May.  Blood is not associated with cough.  Denies any dyspnea, sputum production, fevers, chills.  History noted for sinusitis.  She has had nasal surgery in the past.  Also has intermittent episodes of GERD.  She has chronic joint pain which is thought to be secondary to osteoarthritis and degenerative disc disease.  Follows with Dr. Estanislado Shaw.  She does not take NSAIDs due to side effects.  Pets: Has a dog.  No cats, birds, farm animals Occupation: Used to work at a [employer] until 2007.  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  No current ongoing exposures.  Denies any mold, hot tub, Jacuzzi, humidifier Smoking history: Never smoker Travel history: No significant travel history Relevant family history: There is significant family history of lung disease.  Her aunt had a lung transplant.  She is not clear why the transplant was needed.. Several family members have lung cancer, mesothelioma, COPD.  Interim history: Here for follow-up of CT.  States that she has not had any more episodes of blood in sputum  Has seen Dr. Royce Shaw, nephrology today and Virginia Shaw tells me that kidney biopsy is not being planned as her renal function stable.  Outpatient Encounter Medications as of 07/09/2019  Medication Sig  . amLODipine (NORVASC) 5 MG tablet Take 5 mg by mouth 2 (two) times daily. Pt. Takes 1 tablet in the morning and a half at night.  Marland Kitchen aspirin 81 MG chewable tablet Chew 81 mg by mouth daily.   . isosorbide mononitrate (ISMO,MONOKET) 20 MG tablet Take 20 mg by mouth 2 (two) times daily at 10 AM and 5 PM.   . metoprolol tartrate (LOPRESSOR) 25 MG tablet TAKE 0.5 TABLETS (12.5 MG TOTAL) BY MOUTH TWO (2) TIMES A DAY.  . nitroGLYCERIN (NITROSTAT) 0.4 MG SL tablet Take by mouth as needed.   Marland Kitchen OVER THE COUNTER MEDICATION    No facility-administered encounter medications on file as of 07/09/2019.    Physical Exam: Blood pressure 108/60, pulse 66, temperature 98.3 F (36.8 C), height 4\' 11"  (1.499 m), weight 133 lb 12.8 oz (60.7 kg), SpO2 99 %. Gen:      No acute distress HEENT:  EOMI, sclera anicteric Neck:     No masses; no thyromegaly Lungs:    Clear to auscultation bilaterally; normal respiratory effort CV:         Regular rate and rhythm; no murmurs Abd:      + bowel sounds; soft, non-tender; no palpable masses, no distension Ext:    No edema; adequate  peripheral perfusion Skin:      Warm and dry; no rash Neuro: alert and oriented x 3 Psych: normal mood and affect  Data Reviewed: Imaging: Chest x-ray 07/24/2018- no active cardiopulmonary disease. CT abdomen pelvis 03/01/2019- no acute abnormality in lower chest. High-resolution CT 07/08/2019- no interstitial lung disease.  Minimal air trapping.  Aortic atherosclerosis.   I have reviewed images personally  Labs: From nephrology office 05/05/2019- Anti-proteinase 3 antibody-8.8 [0-3.5) MPO ab, c-ANCA, p-ANCA-negative Anti-GBM antibody-6 units C3-C4 complement-within normal limits ANA- negative  Metabolic panel [id number] BUN 17, creatinine 0.85  CBC--WBC 7.9, hemoglobin 14.3, platelets 379, eos 15%, absolute eosinophil count 1185 UA 05/05/2019-1+ occult blood, negative protein  Assessment:  Evaluation for blood in sputum Unclear if this is actually hemoptysis.  She has been worked up for hematuria with anti-PR-3 antibody. Though there is concern for a vasculitis process her CT chest is normal with no evidence of interstitial lung disease. At this point there is no indication for bronchoscopy or lung biopsy.  If symptoms recur then we can reassess.  Discussed in detail with patient today.  Plan/Recommendations: - Continue monitoring symptoms Follow-up in 6 months  Marshell Garfinkel MD [facility] 07/09/2019, 9:49 AM  CC: Virginia Ada, MD

## 2019-07-22 ENCOUNTER — Other Ambulatory Visit (HOSPITAL_COMMUNITY): Payer: Self-pay | Admitting: Nephrology

## 2019-07-22 DIAGNOSIS — Z87898 Personal history of other specified conditions: Secondary | ICD-10-CM

## 2019-07-22 DIAGNOSIS — Z8709 Personal history of other diseases of the respiratory system: Secondary | ICD-10-CM

## 2019-07-22 DIAGNOSIS — R319 Hematuria, unspecified: Secondary | ICD-10-CM

## 2019-07-22 DIAGNOSIS — R768 Other specified abnormal immunological findings in serum: Secondary | ICD-10-CM

## 2019-07-28 NOTE — Progress Notes (Signed)
Office Visit Note  Patient: Virginia Shaw             Date of Birth: 20-Sep-1966           MRN: [id number]             PCP: [name], MD Referring: [name], MD Visit Date: 07/30/2019 Occupation: @GUAROCC @  Subjective:  Left hip pain   History of Present Illness: Virginia Shaw is a 53 y.o. female with history of osteoarthritis and DDD.  She presents today with severe lower back pain that started 1 week ago.  She states the pain has been radiating into her left groin.  She states that at times the pain is sharp and severe but she has a dull ache constantly.  She denies any injuries or changes in activity.  She states that sitting exacerbates the pain.  She has been unable to take over-the-counter products for pain relief due to not tolerating them.  She has chronic neck pain as well.  She continues with generalized muscle aches and muscle tenderness due to myofascial pain syndrome.  She has chronic pain in both hands but denies any joint swelling.  She states that she had a bone density performed yesterday and is scheduled for renal biopsy on Tuesday.  She is concerned about proceeding with the renal biopsy if her back pain remains this severe.     Activities of Daily Living:  Patient reports morning stiffness for 10-15 minutes.   Patient Reports nocturnal pain.  Difficulty dressing/grooming: Denies Difficulty climbing stairs: Reports Difficulty getting out of chair: Reports Difficulty using hands for taps, buttons, cutlery, and/or writing: Reports  Review of Systems  Constitutional: Negative for fatigue.  HENT: Negative for mouth sores, mouth dryness and nose dryness.   Eyes: Positive for dryness. Negative for pain and visual disturbance.  Respiratory: Negative for cough, hemoptysis, shortness of breath and difficulty breathing.   Cardiovascular: Negative for chest pain, palpitations, hypertension and swelling in legs/feet.  Gastrointestinal: Negative for  blood in stool, constipation and diarrhea.  Endocrine: Negative for increased urination.  Genitourinary: Positive for painful urination.  Musculoskeletal: Positive for arthralgias, joint pain, joint swelling and morning stiffness. Negative for myalgias, muscle weakness, muscle tenderness and myalgias.  Skin: Negative for color change, pallor, rash, hair loss, nodules/bumps, skin tightness, ulcers and sensitivity to sunlight.  Allergic/Immunologic: Negative for susceptible to infections.  Neurological: Negative for dizziness, numbness, headaches and weakness.  Hematological: Negative for swollen glands.  Psychiatric/Behavioral: Negative for depressed mood and sleep disturbance. The patient is not nervous/anxious.     PMFS History:  Patient Active Problem List   Diagnosis Date Noted  . DDD (degenerative disc disease), cervical 08/11/2018  . DDD (degenerative disc disease), lumbar 08/11/2018  . Somatic complaints, multiple 03/14/2017  . Chronic pain syndrome 08/25/2014  . Irritable bowel syndrome (IBS) 01/12/2014  . 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 Chronic abdominal pain 01/11/2014  . Chronic venous insufficiency 10/30/2013  . Varicose veins 10/30/2013  . Chest tightness 03/25/2012  . 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 RECTAL BLEEDING 08/26/2008  . UTI'S, RECURRENT 08/26/2008    Past Medical History:  Diagnosis Date  . Abnormal involuntary movements(781.0)   . Allergic rhinitis, cause unspecified   . Anxiety state, unspecified   . Cervical spondylosis without myelopathy   . Disturbance of skin sensation   . Disturbance of skin sensation   . GERD (gastroesophageal reflux disease)   . History of chicken pox   .  Irritable bowel syndrome   . Lumbago   . Myalgia and myositis, unspecified   . Osteoporosis   . Other and unspecified angina pectoris   . Other malaise and fatigue   . 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 Somatization disorder   . Spasm of muscle   .  Systemic sclerosis ([place])   . Unspecified hereditary and idiopathic peripheral neuropathy     Family History  Problem Relation Age of Onset  . Hypertension Father   . Hyperlipidemia Father   . Diabetes Father   . Stroke Father   . Cancer Maternal Uncle        lung  . Cancer Paternal Aunt        lung  . Migraines Mother   . Heart attack Paternal Grandmother   . Heart attack Paternal Grandfather   . Hypertension Paternal Grandfather   . Migraines Sister    Past Surgical History:  Procedure Laterality Date  . ABDOMINAL HYSTERECTOMY  1997  . arnold chiari malformation  2013  . BACK SURGERY  2001, 2011,2013   Lumbar x 2  . BLADDER SURGERY  2011  . CARDIAC CATHETERIZATION    . CATARACT EXTRACTION    . CERVICAL SPINE SURGERY  2013  . CHOLECYSTECTOMY    . COLONOSCOPY  2009   "Normal" Dr Sharlett Iles  . COLONOSCOPY  2002   "Normal" Dr Freddrick March  . COLONOSCOPY  ~2013   Dr Ferdinand Lango.  "Chest pain with the prep" = aborted  . CRANIECTOMY SUBOCCIPITAL W/ CERVICAL LAMINECTOMY / CHIARI  2012   Dr Annette Stable, Chiari decompression with suboccipital craniectomy and  . CYSTOCELE REPAIR  2002   Dr Gaetano Net  . OOPHORECTOMY  1999, 2000  . PITUITARY EXCISION  2010   pituitary cyst removed  . RECTOCELE REPAIR  2002   Dr. Gaetano Net  . tear duct tubes  2005  . TUBAL LIGATION     Social History   Social History Narrative   Lives at home with children. ( 2 daughters)   Regular exercise-yes   Caffeine Use-no   GED   Not Working outside home    There is no immunization history on file for this patient.   Objective: Vital Signs: BP 104/74 (BP Location: Right Arm, Patient Position: Sitting, Cuff Size: Normal)   Pulse 68   Resp 14   Ht 5' (1.524 m)   Wt 135 lb 6.4 oz (61.4 kg)   BMI 26.44 kg/m    Physical Exam Vitals signs and nursing note reviewed.  Constitutional:      Appearance: She is well-developed.  HENT:     Head: Normocephalic and atraumatic.  Eyes:     Conjunctiva/sclera:  Conjunctivae normal.  Neck:     Musculoskeletal: Normal range of motion.  Cardiovascular:     Rate and Rhythm: Normal rate and regular rhythm.     Heart sounds: Normal heart sounds.  Pulmonary:     Effort: Pulmonary effort is normal.     Breath sounds: Normal breath sounds.  Abdominal:     General: Bowel sounds are normal.     Palpations: Abdomen is soft.  Lymphadenopathy:     Cervical: No cervical adenopathy.  Skin:    General: Skin is warm and dry.     Capillary Refill: Capillary refill takes less than 2 seconds.  Neurological:     Mental Status: She is alert and oriented to person, place, and time.  Psychiatric:        Behavior: Behavior normal.  Musculoskeletal Exam: Generalized hyperalgesia and positive tender points.  C-spine limited range of motion with discomfort.  She is limited range of motion lumbar spine.  Midline spinal tenderness.  No SI joint tenderness.  Shoulder joints and elbow joints, wrist joints, MCPs and PIPs and DIPs good range of motion with no synovitis.  She has DIP synovial thickening consistent with osteoarthritis of both hands.  Hip joints have good range of motion but she experiences increased pain in her lower back with internal and external rotation.  Knee joints, ankle joints, MTPs, PIPs, DIPs good range of motion with no synovitis.  No warmth or effusion of bilateral knee joints.  No tenderness or swelling of ankle joints.  CDAI Exam: CDAI Score: - Patient Global: -; Provider Global: - Swollen: -; Tender: - Joint Exam   No joint exam has been documented for this visit   There is currently no information documented on the homunculus. Go to the Rheumatology activity and complete the homunculus joint exam.  Investigation: No additional findings.  Imaging: Ct Chest High Resolution  Result Date: 07/08/2019 CLINICAL DATA:  Hemoptysis. EXAM: CT CHEST WITHOUT CONTRAST TECHNIQUE: Multidetector CT imaging of the chest was performed following the  standard protocol without intravenous contrast. High resolution imaging of the lungs, as well as inspiratory and expiratory imaging, was performed. COMPARISON:  None. FINDINGS: Cardiovascular: Atherosclerotic calcification of the aorta. Heart size normal. No pericardial effusion. Mediastinum/Nodes: No pathologically enlarged mediastinal or axillary lymph nodes. Hilar regions are difficult to definitively evaluate without IV contrast but appear grossly unremarkable. Esophagus is grossly unremarkable. Lungs/Pleura: Negative for subpleural reticulation, traction bronchiectasis/bronchiolectasis, ground-glass, architectural distortion or honeycombing. Minimal scarring in the right middle lobe and both lower lobes. No pleural fluid. Airway is unremarkable. Minimal air trapping. Upper Abdomen: Visualized portions of the liver, adrenal glands, kidneys, spleen, pancreas, stomach and bowel are unremarkable. Cholecystectomy. Upper abdominal lymph nodes are not enlarged by CT size criteria. Musculoskeletal: No worrisome lytic or sclerotic lesions. IMPRESSION: 1. No evidence of interstitial lung disease. No findings to explain the patient's hemoptysis. 2. Minimal air trapping is indicative of small airways disease. 3.  Aortic atherosclerosis (ICD10-170.0). Electronically Signed   By: Lorin Picket M.D.   On: 07/08/2019 11:56    Recent Labs: Lab Results  Component Value Date   WBC 9.8 08/11/2018   HGB 14.3 08/11/2018   PLT 344 08/11/2018   NA 140 08/24/2015   K 4.2 08/24/2015   CL 102 08/24/2015   CO2 28 08/24/2015   GLUCOSE 104 (H) 08/24/2015   BUN 8 08/24/2015   CREATININE 0.77 08/24/2015   BILITOT 0.5 08/25/2014   ALKPHOS 77 08/25/2014   AST 28 08/25/2014   ALT 25 08/25/2014   PROT 7.6 08/25/2014   ALBUMIN 4.8 08/25/2014   CALCIUM 9.3 08/24/2015   GFRAA >60 08/24/2015    Speciality Comments: No specialty comments available.  Procedures:  No procedures performed Allergies: Iodinated diagnostic  agents, Nsaids, Penicillins, Prednisone, Propoxyphene, Morphine and related, Pantoprazole sodium, Acetaminophen, Benzonatate, Budesonide, Buprenorphine hcl, Ceftin [cefuroxime axetil], Cefuroxime axetil, Cephalexin, Citric acid, Clindamycin, Codeine, Cortisone, Diazepam, Diclofenac, Diltiazem, Doxycycline, Esomeprazole, Hydrocodone, Hydrocodone-acetaminophen, Ibuprofen, Lidocaine, Loracarbef, Macrobid [nitrofurantoin monohyd macro], Maxalt [rizatriptan], Methylprednisolone acetate, Naproxen, Omeprazole, Other, Oxycodone, Oxycodone-acetaminophen, Oxycodone-acetaminophen, Phenazopyridine hcl, and Tomato   Assessment / Plan:     Visit Diagnoses: Primary osteoarthritis of both hands: She has DIP synovial thickening consistent with osteoarthritis of bilateral hands.  She has no tenderness or synovitis on exam.  She experiences chronic pain in both  hands.  Joint protection and muscle strengthening were discussed.  She was advised to notify us if she develops increased joint swelling.  DDD (degenerative disc disease), cervical: She has limited range of motion with discomfort.  She has no symptoms of radiculopathy at this time.  DDD (degenerative disc disease), lumbar: X-rays of the lumbar spine were obtained on 06/01/2019 which revealed L4-L5 and L5-S1 narrowing and facet joint arthropathy.  She presents today with increased lower back pain which started 1 week ago.  She describes the pain is severe and sharp at times.  She denies any recent injuries or changes in activity.  She states that sitting exacerbates the discomfort.  She has painful range of motion and midline spinal tenderness in the lumbar region.  Positive straight leg raise.  She is scheduled for renal biopsy on Tuesday and is concerned because she will have to be lying down for several hours during the procedure.  We will schedule an MRI of the lumbar spine to further assess.  Acute midline low back pain with bilateral sciatica: She presents today  with increased lower back pain which started 1 week ago.  She has midline spinal tenderness and is experiencing left-sided sciatica.  S schedule an MRI of the lumbar spine he has not had any recent injuries or changes in activity.  The pain is exacerbated by sitting and lying down.  X-rays of the lumbar spine on 06/01/2019 revealed disc space narrowing and facet joint arthropathy.  We will schedule an MRI of the lumbar spine.  Radiculopathy, lumbar spine: She has been experiencing radiculopathy bilaterally mainly on the left side.  We will proceed with a MRI of the lumbar spine.  Myofascial pain: She has generalized hyperalgesia and positive tender points.  She continues to have generalized muscle aches muscle tenderness due to fibromyalgia.  Chronic pain syndrome: She has chronic generalized pain.  Other medical conditions are listed as follows:   History of ovarian cyst  Pituitary adenoma (HCC)  Chronic venous insufficiency  Coronary artery spasm ([facility])  History of IBS  History of varicose veins  History of gastroesophageal reflux (GERD)   Orders: Orders Placed This Encounter  Procedures  . MR LUMBAR SPINE WO CONTRAST   No orders of the defined types were placed in this encounter.   Face-to-face time spent with patient was 30 minutes. Greater than 50% of time was spent in counseling and coordination of care.  Follow-Up Instructions: Return in 2 weeks (on 08/13/2019) for Osteoarthritis, DDD.   Bo Merino, MD   I examined and evaluated the patient with Hazel Sams PA.  Patient complains of severe lower back pain.  She is in constant discomfort.  She states the pain has been radiating sometimes to the right sometimes to the left extremity.  We will schedule MRI to evaluate this further.  The plan of care was discussed as noted above.  Bo Merino, MD  Note - This record has been created using Editor, commissioning.  Chart creation errors have been sought, but may not  always  have been located. Such creation errors do not reflect on  the standard of medical care.

## 2019-07-29 ENCOUNTER — Ambulatory Visit (HOSPITAL_COMMUNITY): Payer: Medicaid Other

## 2019-07-30 ENCOUNTER — Other Ambulatory Visit: Payer: Self-pay

## 2019-07-30 ENCOUNTER — Ambulatory Visit (INDEPENDENT_AMBULATORY_CARE_PROVIDER_SITE_OTHER): Payer: Medicaid Other | Admitting: Rheumatology

## 2019-07-30 ENCOUNTER — Encounter: Payer: Self-pay | Admitting: Rheumatology

## 2019-07-30 VITALS — BP 104/74 | HR 68 | Resp 14 | Ht 60.0 in | Wt 135.4 lb

## 2019-07-30 DIAGNOSIS — M7918 Myalgia, other site: Secondary | ICD-10-CM

## 2019-07-30 DIAGNOSIS — Z8742 Personal history of other diseases of the female genital tract: Secondary | ICD-10-CM

## 2019-07-30 DIAGNOSIS — Z8719 Personal history of other diseases of the digestive system: Secondary | ICD-10-CM

## 2019-07-30 DIAGNOSIS — M5441 Lumbago with sciatica, right side: Secondary | ICD-10-CM

## 2019-07-30 DIAGNOSIS — M51369 Other intervertebral disc degeneration, lumbar region without mention of lumbar back pain or lower extremity pain: Secondary | ICD-10-CM

## 2019-07-30 DIAGNOSIS — M5136 Other intervertebral disc degeneration, lumbar region: Secondary | ICD-10-CM

## 2019-07-30 DIAGNOSIS — I872 Venous insufficiency (chronic) (peripheral): Secondary | ICD-10-CM

## 2019-07-30 DIAGNOSIS — D352 Benign neoplasm of pituitary gland: Secondary | ICD-10-CM

## 2019-07-30 DIAGNOSIS — M19041 Primary osteoarthritis, right hand: Secondary | ICD-10-CM | POA: Diagnosis not present

## 2019-07-30 DIAGNOSIS — M5442 Lumbago with sciatica, left side: Secondary | ICD-10-CM

## 2019-07-30 DIAGNOSIS — M5030 Other cervical disc degeneration, unspecified cervical region: Secondary | ICD-10-CM | POA: Diagnosis not present

## 2019-07-30 DIAGNOSIS — M5416 Radiculopathy, lumbar region: Secondary | ICD-10-CM

## 2019-07-30 DIAGNOSIS — G894 Chronic pain syndrome: Secondary | ICD-10-CM

## 2019-07-30 DIAGNOSIS — Z8679 Personal history of other diseases of the circulatory system: Secondary | ICD-10-CM

## 2019-07-30 DIAGNOSIS — M19042 Primary osteoarthritis, left hand: Secondary | ICD-10-CM

## 2019-07-30 DIAGNOSIS — I201 Angina pectoris with documented spasm: Secondary | ICD-10-CM

## 2019-07-30 NOTE — Progress Notes (Signed)
Office Visit Note  Patient: Virginia Shaw             Date of Birth: 04/20/1966           MRN: [id number]             PCP: [name], MD Referring: [name], MD Visit Date: 08/13/2019 Occupation: @GUAROCC @  Subjective:  Discuss MRI and DEXA results   History of Present Illness: Virginia Shaw is a 53 y.o. female with history of osteoarthritis, myofascial pain syndrome, and DDD.  Patient states that she had a bone density done [facility] in September 2020 according to her it went from osteoporosis to osteopenia.  I do not have those results to review.  Patient states she continues to have lower back pain and left-sided radiculopathy.  She states she has had cortisone injections in the past which were not effective.  She might be interested in getting surgery on her lumbar spine.  She has been also having some stiffness in her cervical spine.  She continues to have some generalized pain.  Activities of Daily Living:  Patient reports morning stiffness for a few minutes.   Patient Reports nocturnal pain.  Difficulty dressing/grooming: Denies Difficulty climbing stairs: Denies Difficulty getting out of chair: Reports Difficulty using hands for taps, buttons, cutlery, and/or writing: Reports  Review of Systems  Constitutional: Positive for fatigue.  HENT: Positive for mouth dryness. Negative for mouth sores and nose dryness.   Eyes: Positive for dryness. Negative for pain and visual disturbance.  Respiratory: Positive for shortness of breath. Negative for cough, hemoptysis, wheezing and difficulty breathing.   Cardiovascular: Negative for chest pain, palpitations, hypertension and swelling in legs/feet.  Gastrointestinal: Negative for abdominal pain, blood in stool, constipation and diarrhea.  Endocrine: Negative for increased urination.  Genitourinary: Positive for painful urination.  Musculoskeletal: Positive for arthralgias, joint pain, joint  swelling and morning stiffness. Negative for myalgias, muscle weakness, muscle tenderness and myalgias.  Skin: Negative for color change, pallor, rash, hair loss, nodules/bumps, skin tightness, ulcers and sensitivity to sunlight.  Allergic/Immunologic: Negative for susceptible to infections.  Neurological: Negative for light-headedness, headaches and memory loss.  Hematological: Negative for swollen glands.  Psychiatric/Behavioral: Positive for sleep disturbance. Negative for depressed mood and confusion. The patient is not nervous/anxious.     PMFS History:  Patient Active Problem List   Diagnosis Date Noted  . DDD (degenerative disc disease), cervical 08/11/2018  . DDD (degenerative disc disease), lumbar 08/11/2018  . Somatic complaints, multiple 03/14/2017  . Chronic pain syndrome 08/25/2014  . Irritable bowel syndrome (IBS) 01/12/2014  . 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 Chronic abdominal pain 01/11/2014  . Chronic venous insufficiency 10/30/2013  . Varicose veins 10/30/2013  . Chest tightness 03/25/2012  . 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 RECTAL BLEEDING 08/26/2008  . UTI'S, RECURRENT 08/26/2008    Past Medical History:  Diagnosis Date  . Abnormal involuntary movements(781.0)   . Allergic rhinitis, cause unspecified   . Anxiety state, unspecified   . Cervical spondylosis without myelopathy   . Disturbance of skin sensation   . Disturbance of skin sensation   . GERD (gastroesophageal reflux disease)   . History of chicken pox   . Irritable bowel syndrome   . Lumbago   . Myalgia and myositis, unspecified   . Osteoporosis   . Other and unspecified angina pectoris   . Other malaise and fatigue   . 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 Somatization  disorder   . Spasm of muscle   . Systemic sclerosis ([place])   . Unspecified hereditary and idiopathic peripheral neuropathy     Family History  Problem Relation Age of Onset  . Hypertension Father   .  Hyperlipidemia Father   . Diabetes Father   . Stroke Father   . Cancer Maternal Uncle        lung  . Cancer Paternal Aunt        lung  . Migraines Mother   . Heart attack Paternal Grandmother   . Heart attack Paternal Grandfather   . Hypertension Paternal Grandfather   . Migraines Sister    Past Surgical History:  Procedure Laterality Date  . ABDOMINAL HYSTERECTOMY  1997  . arnold chiari malformation  2013  . BACK SURGERY  2001, 2011,2013   Lumbar x 2  . BLADDER SURGERY  2011  . CARDIAC CATHETERIZATION    . CATARACT EXTRACTION    . CERVICAL SPINE SURGERY  2013  . CHOLECYSTECTOMY    . COLONOSCOPY  2009   "Normal" Dr Sharlett Iles  . COLONOSCOPY  2002   "Normal" Dr Freddrick March  . COLONOSCOPY  ~2013   Dr Ferdinand Lango.  "Chest pain with the prep" = aborted  . CRANIECTOMY SUBOCCIPITAL W/ CERVICAL LAMINECTOMY / CHIARI  2012   Dr Annette Stable, Chiari decompression with suboccipital craniectomy and  . CYSTOCELE REPAIR  2002   Dr Gaetano Net  . OOPHORECTOMY  1999, 2000  . PITUITARY EXCISION  2010   pituitary cyst removed  . RECTOCELE REPAIR  2002   Dr. Gaetano Net  . RENAL BIOPSY  08/04/2019  . tear duct tubes  2005  . TUBAL LIGATION     Social History   Social History Narrative   Lives at home with children. ( 2 daughters)   Regular exercise-yes   Caffeine Use-no   GED   Not Working outside home    There is no immunization history on file for this patient.   Objective: Vital Signs: BP (!) 88/64 (BP Location: Right Arm, Patient Position: Sitting, Cuff Size: Normal)   Pulse 77   Resp 12   Ht 4' 11.75" (1.518 m)   Wt 135 lb 3.2 oz (61.3 kg)   BMI 26.63 kg/m    Physical Exam Vitals signs and nursing note reviewed.  Constitutional:      Appearance: She is well-developed.  HENT:     Head: Normocephalic and atraumatic.  Eyes:     Conjunctiva/sclera: Conjunctivae normal.  Neck:     Musculoskeletal: Normal range of motion.  Cardiovascular:     Rate and Rhythm: Normal rate and regular  rhythm.     Heart sounds: Normal heart sounds.  Pulmonary:     Effort: Pulmonary effort is normal.     Breath sounds: Normal breath sounds.  Abdominal:     General: Bowel sounds are normal.     Palpations: Abdomen is soft.  Lymphadenopathy:     Cervical: No cervical adenopathy.  Skin:    General: Skin is warm and dry.     Capillary Refill: Capillary refill takes less than 2 seconds.  Neurological:     Mental Status: She is alert and oriented to person, place, and time.  Psychiatric:        Behavior: Behavior normal.      Musculoskeletal Exam: She has some stiffness with range of motion of her cervical spine and discomfort with left lateral rotation.  She had painful range of motion of lumbar spine and  tenderness on palpation of her lower lumbar region.  Shoulder joints and elbow joints with good range of motion.  No DIP and PIP thickening or swelling was noted.  She is stiffness with range of motion of her hip joints.  Knee joints ankles been good range of motion with no synovitis.  CDAI Exam: CDAI Score: - Patient Global: -; Provider Global: - Swollen: -; Tender: - Joint Exam   No joint exam has been documented for this visit   There is currently no information documented on the homunculus. Go to the Rheumatology activity and complete the homunculus joint exam.  Investigation: No additional findings.  Imaging: Ct Abdomen Pelvis Wo Contrast  Result Date: 08/07/2019 CLINICAL DATA:  History of left renal biopsy 3 days ago with flank pain, initial encounter EXAM: CT ABDOMEN AND PELVIS WITHOUT CONTRAST TECHNIQUE: Multidetector CT imaging of the abdomen and pelvis was performed following the standard protocol without IV contrast. COMPARISON:  Ultrasound from 08/04/2019 FINDINGS: Lower chest: No acute abnormality. Hepatobiliary: No focal liver abnormality is seen. Status post cholecystectomy. No biliary dilatation. Pancreas: Unremarkable. No pancreatic ductal dilatation or surrounding  inflammatory changes. Spleen: Normal in size without focal abnormality. Adrenals/Urinary Tract: Adrenal glands are within normal limits. Kidneys are well visualized bilaterally. The right kidney is within normal limits. The left kidney demonstrates some hyperdense material surrounding the lower pole. No deformation of the underlying kidney is noted. This measures approximately 4.7 x 1 cm. This is consistent with post biopsy hemorrhage although does not appear significantly different from the immediate post biopsy ultrasound images. Bladder is well distended and within normal limits. Stomach/Bowel: The appendix is within normal limits. No obstructive or inflammatory changes of the colon or small bowel are seen. Stomach is within normal limits. Vascular/Lymphatic: No significant vascular findings are present. No enlarged abdominal or pelvic lymph nodes. Reproductive: Status post hysterectomy. No adnexal masses. Other: No abdominal wall hernia or abnormality. No abdominopelvic ascites. Musculoskeletal: Degenerative changes of lumbar spine are noted. IMPRESSION: Changes consistent with perinephric hemorrhage surrounding the lower pole of the left kidney. No deformation of the underlying kidney is seen. The overall collection appears similar to that seen on the post biopsy images. These results will be called to the ordering clinician or representative by the Radiologist Assistant, and communication documented in the PACS or zVision Dashboard. Electronically Signed   By: Inez Catalina M.D.   On: 08/07/2019 13:04   Mr Lumbar Spine Wo Contrast  Result Date: 08/11/2019 CLINICAL DATA:  Back pain radiating to the left groin. EXAM: MRI LUMBAR SPINE WITHOUT CONTRAST TECHNIQUE: Multiplanar, multisequence MR imaging of the lumbar spine was performed. No intravenous contrast was administered. COMPARISON:  CT scan 08/07/2019, lumbar radiographs 06/01/2019, and lumbar MRI from 01/03/2017. FINDINGS: Segmentation: The lowest lumbar  type non-rib-bearing vertebra is labeled as L5. Alignment:  3 mm degenerative retrolisthesis at L4-5. Vertebrae: Disc desiccation at L4-5 and L5-S1 with loss of disc height at both levels. Conus medullaris and cauda equina: Conus extends to the L1 level. Conus and cauda equina appear normal. Paraspinal and other soft tissues: Left kidney lower pole perinephric hematoma is again observed with accentuated T1 signal and intermediate T2 signal. This does not appear larger than it did on 08/07/2019. Disc levels: T12-L1: Unremarkable. L1-2: Unremarkable. L2-3: Unremarkable. L3-4: Unremarkable. L4-5: Borderline left foraminal stenosis due to facet arthropathy, disc bulge, small left lateral recess disc protrusion, and mild intervertebral spurring. The bulge abuts the left L5 nerve roots in the lateral recess does not  appear to significantly compress the nerve roots. Left laminectomy noted. L5-S1: Mild bilateral foraminal stenosis due to intervertebral and facet spurring along with mild disc bulge. Findings at this level are stable. IMPRESSION: 1. Lumbar spondylosis and degenerative disc disease leading to mild impingement in the neural foramina at L5-S1, and borderline impingement at L4-5, not appreciably changed from the 01/03/2017 exam. 2. Essentially stable appearance of the left kidney lower pole perinephric hematoma. Electronically Signed   By: Van Clines M.D.   On: 08/11/2019 09:19   US Biopsy (kidney)  Result Date: 08/04/2019 INDICATION: Microscopic hematuria and need for random renal biopsy. EXAM: ULTRASOUND GUIDED CORE BIOPSY OF LEFT KIDNEY MEDICATIONS: None. ANESTHESIA/SEDATION: Fentanyl 50 mcg IV; Versed 1.0 mg IV Moderate Sedation Time:  10 minutes. The patient was continuously monitored during the procedure by the interventional radiology nurse under my direct supervision. PROCEDURE: The procedure, risks, benefits, and alternatives were explained to the patient. Questions regarding the procedure  were encouraged and answered. The patient understands and consents to the procedure. A time-out was performed prior to initiating the procedure. Both kidneys were imaged in a prone position by ultrasound. The left flank region was prepped with chlorhexidine in a sterile fashion, and a sterile drape was applied covering the operative field. A sterile gown and sterile gloves were used for the procedure. Local anesthesia was provided with 1% Lidocaine. Under ultrasound guidance, a 16 gauge biopsy device was utilized in obtaining 2 separate core biopsy samples of lower pole cortex of the left kidney. Ultrasound was performed before and after each core sample was obtained. Core biopsy samples were submitted in saline. COMPLICATIONS: None immediate. FINDINGS: Both kidneys are well visualized by ultrasound. The left was chosen for biopsy given slightly closer distance of the lower pole to the skin. Intact core biopsy samples were obtained. Post biopsy ultrasound shows a small amount perinephric hemorrhage posterior to the lower pole of the left kidney. IMPRESSION: Ultrasound-guided core biopsy performed of the left kidney at the level of lower pole cortex. Postprocedural ultrasound shows focal perinephric hemorrhage adjacent to the lower pole. The patient will be followed during recovery. Electronically Signed   By: Aletta Edouard M.D.   On: 08/04/2019 15:26    Recent Labs: Lab Results  Component Value Date   WBC 6.3 08/04/2019   HGB 14.8 08/04/2019   PLT 382 08/04/2019   NA 140 08/24/2015   K 4.2 08/24/2015   CL 102 08/24/2015   CO2 28 08/24/2015   GLUCOSE 104 (H) 08/24/2015   BUN 8 08/24/2015   CREATININE 0.77 08/24/2015   BILITOT 0.5 08/25/2014   ALKPHOS 77 08/25/2014   AST 28 08/25/2014   ALT 25 08/25/2014   PROT 7.6 08/25/2014   ALBUMIN 4.8 08/25/2014   CALCIUM 9.3 08/24/2015   GFRAA >60 08/24/2015    Speciality Comments: No specialty comments available.  Procedures:  No procedures  performed Allergies: Iodinated diagnostic agents, Nsaids, Penicillins, Prednisone, Propoxyphene, Morphine and related, Pantoprazole sodium, Acetaminophen, Benzonatate, Budesonide, Buprenorphine hcl, Ceftin [cefuroxime axetil], Cefuroxime axetil, Cephalexin, Citric acid, Clindamycin, Codeine, Cortisone, Diazepam, Diclofenac, Diltiazem, Doxycycline, Esomeprazole, Hydrocodone, Hydrocodone-acetaminophen, Ibuprofen, Lidocaine, Loracarbef, Macrobid [nitrofurantoin monohyd macro], Maxalt [rizatriptan], Methylprednisolone acetate, Naproxen, Omeprazole, Other, Oxycodone, Oxycodone-acetaminophen, Oxycodone-acetaminophen, Phenazopyridine hcl, and Tomato   Assessment / Plan:     Visit Diagnoses: Primary osteoarthritis of both hands-patient has no synovitis on examination.  She has some stiffness.  DDD (degenerative disc disease), cervical-she has discomfort.  She has discomfort with left lateral rotation.  DDD (degenerative disc  disease), lumbar-she had recent MRI and I discussed the findings with her.  She would like to see a spinal specialist.  Will refer to Dr. Louanne Skye.  Patient states that she has had epidural and facet joint injections in the past which were not effective.  Radiculopathy, lumbar region   Myofascial pain-she continues to have some generalized pain and discomfort.  Chronic pain syndrome  Other osteoporosis without current pathological fracture - DEXA 07/05/17: Left femoral neck -2.8.  Patient states that she had recent bone density which showed that she was in the osteopenia range.  I do not have those results available.  Have advised patient to forward those results to [place].  History of ovarian cyst  Pituitary adenoma ([facility])  Chronic venous insufficiency  Coronary artery spasm ([facility])  History of IBS  History of varicose veins  History of gastroesophageal reflux (GERD)   Orders: No orders of the defined types were placed in this encounter.  No orders of the defined types were  placed in this encounter.   Follow-Up Instructions: Return in about 6 months (around 02/11/2020) for Osteoarthritis, DDD, myofascial pain .   Bo Merino, MD  Note - This record has been created using Editor, commissioning.  Chart creation errors have been sought, but may not always  have been located. Such creation errors do not reflect on  the standard of medical care.

## 2019-08-03 ENCOUNTER — Other Ambulatory Visit: Payer: Self-pay | Admitting: Radiology

## 2019-08-04 ENCOUNTER — Ambulatory Visit (HOSPITAL_COMMUNITY)
Admission: RE | Admit: 2019-08-04 | Discharge: 2019-08-04 | Disposition: A | Discharge status: Home / Self Care | Payer: Medicaid Other | Source: Ambulatory Visit | Attending: Nephrology | Admitting: Nephrology

## 2019-08-04 ENCOUNTER — Encounter (HOSPITAL_COMMUNITY): Payer: Self-pay

## 2019-08-04 ENCOUNTER — Other Ambulatory Visit: Payer: Self-pay

## 2019-08-04 DIAGNOSIS — R319 Hematuria, unspecified: Secondary | ICD-10-CM | POA: Diagnosis present

## 2019-08-04 DIAGNOSIS — Z8249 Family history of ischemic heart disease and other diseases of the circulatory system: Secondary | ICD-10-CM | POA: Diagnosis not present

## 2019-08-04 DIAGNOSIS — Z884 Allergy status to anesthetic agent status: Secondary | ICD-10-CM | POA: Insufficient documentation

## 2019-08-04 DIAGNOSIS — Z801 Family history of malignant neoplasm of trachea, bronchus and lung: Secondary | ICD-10-CM | POA: Diagnosis not present

## 2019-08-04 DIAGNOSIS — Z9049 Acquired absence of other specified parts of digestive tract: Secondary | ICD-10-CM | POA: Diagnosis not present

## 2019-08-04 DIAGNOSIS — Z7982 Long term (current) use of aspirin: Secondary | ICD-10-CM | POA: Insufficient documentation

## 2019-08-04 DIAGNOSIS — Z885 Allergy status to narcotic agent status: Secondary | ICD-10-CM | POA: Insufficient documentation

## 2019-08-04 DIAGNOSIS — Z881 Allergy status to other antibiotic agents status: Secondary | ICD-10-CM | POA: Diagnosis not present

## 2019-08-04 DIAGNOSIS — Z87898 Personal history of other specified conditions: Secondary | ICD-10-CM

## 2019-08-04 DIAGNOSIS — M47812 Spondylosis without myelopathy or radiculopathy, cervical region: Secondary | ICD-10-CM | POA: Insufficient documentation

## 2019-08-04 DIAGNOSIS — M810 Age-related osteoporosis without current pathological fracture: Secondary | ICD-10-CM | POA: Diagnosis not present

## 2019-08-04 DIAGNOSIS — Z91018 Allergy to other foods: Secondary | ICD-10-CM | POA: Insufficient documentation

## 2019-08-04 DIAGNOSIS — Z888 Allergy status to other drugs, medicaments and biological substances status: Secondary | ICD-10-CM | POA: Insufficient documentation

## 2019-08-04 DIAGNOSIS — Z8709 Personal history of other diseases of the respiratory system: Secondary | ICD-10-CM | POA: Diagnosis not present

## 2019-08-04 DIAGNOSIS — Z886 Allergy status to analgesic agent status: Secondary | ICD-10-CM | POA: Diagnosis not present

## 2019-08-04 DIAGNOSIS — Z79899 Other long term (current) drug therapy: Secondary | ICD-10-CM | POA: Insufficient documentation

## 2019-08-04 DIAGNOSIS — Z880 Allergy status to penicillin: Secondary | ICD-10-CM | POA: Diagnosis not present

## 2019-08-04 DIAGNOSIS — G609 Hereditary and idiopathic neuropathy, unspecified: Secondary | ICD-10-CM | POA: Diagnosis not present

## 2019-08-04 DIAGNOSIS — Z91041 Radiographic dye allergy status: Secondary | ICD-10-CM | POA: Insufficient documentation

## 2019-08-04 DIAGNOSIS — Z883 Allergy status to other anti-infective agents status: Secondary | ICD-10-CM | POA: Diagnosis not present

## 2019-08-04 DIAGNOSIS — Z833 Family history of diabetes mellitus: Secondary | ICD-10-CM | POA: Insufficient documentation

## 2019-08-04 DIAGNOSIS — R768 Other specified abnormal immunological findings in serum: Secondary | ICD-10-CM | POA: Diagnosis not present

## 2019-08-04 DIAGNOSIS — Z823 Family history of stroke: Secondary | ICD-10-CM | POA: Insufficient documentation

## 2019-08-04 DIAGNOSIS — Z90710 Acquired absence of both cervix and uterus: Secondary | ICD-10-CM | POA: Insufficient documentation

## 2019-08-04 HISTORY — PX: RENAL BIOPSY: SHX156

## 2019-08-04 LAB — PROTIME-INR
INR: 1 (ref 0.8–1.2)
Prothrombin Time: 12.8 seconds (ref 11.4–15.2)

## 2019-08-04 LAB — CBC
HCT: 43.2 % (ref 36.0–46.0)
Hemoglobin: 14.8 g/dL (ref 12.0–15.0)
MCH: 31.2 pg (ref 26.0–34.0)
MCHC: 34.3 g/dL (ref 30.0–36.0)
MCV: 90.9 fL (ref 80.0–100.0)
Platelets: 382 10*3/uL (ref 150–400)
RBC: 4.75 MIL/uL (ref 3.87–5.11)
RDW: 12.2 % (ref 11.5–15.5)
WBC: 6.3 10*3/uL (ref 4.0–10.5)
nRBC: 0 % (ref 0.0–0.2)

## 2019-08-04 MED ORDER — SODIUM CHLORIDE 0.9 % IV SOLN: INTRAVENOUS | Status: DC

## 2019-08-04 MED ORDER — BUPIVACAINE HCL (PF) 0.25 % IJ SOLN
10.0000 mL | Freq: Once | INTRAMUSCULAR | Status: DC
  Filled 2019-08-04: qty 10

## 2019-08-04 MED ORDER — ONDANSETRON HCL 4 MG/2ML IJ SOLN
INTRAMUSCULAR | Status: AC
  Filled 2019-08-04: qty 2

## 2019-08-04 MED ORDER — MIDAZOLAM HCL 2 MG/2ML IJ SOLN
INTRAMUSCULAR | Status: AC | PRN
  Administered 2019-08-04: 0.5 mg via INTRAVENOUS

## 2019-08-04 MED ORDER — SODIUM CHLORIDE 0.9 % IV SOLN
INTRAVENOUS | Status: AC | PRN
  Administered 2019-08-04: 10 mL/h via INTRAVENOUS

## 2019-08-04 MED ORDER — ONDANSETRON HCL 4 MG/2ML IJ SOLN
INTRAMUSCULAR | Status: AC | PRN
  Administered 2019-08-04: 4 mg via INTRAVENOUS

## 2019-08-04 MED ORDER — NITROGLYCERIN 0.4 MG SL SUBL
SUBLINGUAL_TABLET | SUBLINGUAL | Status: AC
  Filled 2019-08-04: qty 1

## 2019-08-04 MED ORDER — MIDAZOLAM HCL 2 MG/2ML IJ SOLN
INTRAMUSCULAR | Status: AC
  Filled 2019-08-04: qty 2

## 2019-08-04 MED ORDER — FENTANYL CITRATE (PF) 100 MCG/2ML IJ SOLN
INTRAMUSCULAR | Status: AC | PRN
  Administered 2019-08-04: 25 ug via INTRAVENOUS

## 2019-08-04 MED ORDER — FENTANYL CITRATE (PF) 100 MCG/2ML IJ SOLN
INTRAMUSCULAR | Status: AC
  Filled 2019-08-04: qty 2

## 2019-08-04 MED ORDER — NITROGLYCERIN 0.4 MG SL SUBL
0.4000 mg | SUBLINGUAL_TABLET | SUBLINGUAL | Status: DC | PRN
  Administered 2019-08-04: 10:00:00 0.4 mg via SUBLINGUAL
  Filled 2019-08-04: qty 25

## 2019-08-04 NOTE — Progress Notes (Addendum)
Patient c/o chest tightness.  Says she sometimes has angina when given pain medication and when she doesn't take her morning medications.  VSS.  Jannifer Franklin, PA gave orders for nitro sublingual

## 2019-08-04 NOTE — H&P (Signed)
Chief Complaint: Patient was seen in consultation today for random renal biopsy.  Referring Physician(s): Claudia Desanctis  Supervising Physician: Aletta Edouard  Patient Status: [facility] - Out-pt  History of Present Illness: Virginia Shaw is a 53 y.o. female with a past medical history significant for anxiety, GERD, IBS, Prinzmetal's angina and systemic sclerosis who presents today for a random renal biopsy. Virginia Shaw reports that she has a history of "whole body inflammation" that she cannot take medications for due to multiple allergies and she recently begun having painless hematuria of unknown etiology. She was seen by nephrology for further evaluation and a UA was obtained on 06/30/19 which showed 1+ occult blood and 3-10 RBC/hpf. A request has been made to IR for a random renal biopsy to further evaluate the hematuria.  Past Medical History:  Diagnosis Date  . Abnormal involuntary movements(781.0)   . Allergic rhinitis, cause unspecified   . Anxiety state, unspecified   . Cervical spondylosis without myelopathy   . Disturbance of skin sensation   . Disturbance of skin sensation   . GERD (gastroesophageal reflux disease)   . History of chicken pox   . Irritable bowel syndrome   . Lumbago   . Myalgia and myositis, unspecified   . Osteoporosis   . Other and unspecified angina pectoris   . Other malaise and fatigue   . 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[place]) 01/11/2014  . Somatization disorder   . Spasm of muscle   . Systemic sclerosis ([place])   . Unspecified hereditary and idiopathic peripheral neuropathy     Past Surgical History:  Procedure Laterality Date  . ABDOMINAL HYSTERECTOMY  1997  . arnold chiari malformation  2013  . BACK SURGERY  2001, 2011,2013   Lumbar x 2  . BLADDER SURGERY  2011  . CARDIAC CATHETERIZATION    . CATARACT EXTRACTION    . CERVICAL SPINE SURGERY  2013  . CHOLECYSTECTOMY    . COLONOSCOPY  2009   "Normal" Dr Sharlett Iles  . COLONOSCOPY  2002   "Normal" Dr  Freddrick March  . COLONOSCOPY  ~2013   Dr Ferdinand Lango.  "Chest pain with the prep" = aborted  . CRANIECTOMY SUBOCCIPITAL W/ CERVICAL LAMINECTOMY / CHIARI  2012   Dr Annette Stable, Chiari decompression with suboccipital craniectomy and  . CYSTOCELE REPAIR  2002   Dr Gaetano Net  . OOPHORECTOMY  1999, 2000  . PITUITARY EXCISION  2010   pituitary cyst removed  . RECTOCELE REPAIR  2002   Dr. Gaetano Net  . tear duct tubes  2005  . TUBAL LIGATION      Allergies: Iodinated diagnostic agents, Nsaids, Penicillins, Prednisone, Propoxyphene, Morphine and related, Pantoprazole sodium, Acetaminophen, Benzonatate, Budesonide, Buprenorphine hcl, Ceftin [cefuroxime axetil], Cefuroxime axetil, Cephalexin, Citric acid, Clindamycin, Codeine, Cortisone, Diazepam, Diclofenac, Diltiazem, Doxycycline, Esomeprazole, Hydrocodone, Hydrocodone-acetaminophen, Ibuprofen, Lidocaine, Loracarbef, Macrobid [nitrofurantoin monohyd macro], Maxalt [rizatriptan], Methylprednisolone acetate, Naproxen, Omeprazole, Other, Oxycodone, Oxycodone-acetaminophen, Oxycodone-acetaminophen, Phenazopyridine hcl, and Tomato  Medications: Prior to Admission medications   Medication Sig Start Date End Date Taking? Authorizing Provider  amLODipine (NORVASC) 5 MG tablet Take 2.5-5 mg by mouth See admin instructions. Pt. Takes 1 tablet in the morning and a half at night.   Yes [provider]  aspirin 81 MG chewable tablet Chew 81 mg by mouth daily.    Yes [provider]  isosorbide mononitrate (ISMO,MONOKET) 20 MG tablet Take 20 mg by mouth 2 (two) times daily at 10 AM and 5 PM.  12/19/16  Yes [provider]  metoprolol tartrate (  LOPRESSOR) 25 MG tablet Take 12.5 mg by mouth 2 (two) times daily.  08/06/18  Yes [provider]  nitroGLYCERIN (NITROSTAT) 0.4 MG SL tablet Place 0.4 mg under the tongue as needed for chest pain.  12/24/18  Yes [provider]  [place]     [provider]     Family  History  Problem Relation Age of Onset  . Hypertension Father   . Hyperlipidemia Father   . Diabetes Father   . Stroke Father   . Cancer Maternal Uncle        lung  . Cancer Paternal Aunt        lung  . Migraines Mother   . Heart attack Paternal Grandmother   . Heart attack Paternal Grandfather   . Hypertension Paternal Grandfather   . Migraines Sister     Social History   Socioeconomic History  . Marital status: Legally Separated    Spouse name: Not on file  . Number of children: 2  . Years of education: GED  . Highest education level: Not on file  Occupational History    Employer: UNEMPLOYED  Social Needs  . Financial resource strain: Not on file  . Food insecurity    Worry: Not on file    Inability: Not on file  . Transportation needs    Medical: Not on file    Non-medical: Not on file  Tobacco Use  . Smoking status: Never Smoker  . Smokeless tobacco: Never Used  Substance and Sexual Activity  . Alcohol use: No  . Drug use: No  . Sexual activity: Never    Birth control/protection: Abstinence  Lifestyle  . Physical activity    Days per week: Not on file    Minutes per session: Not on file  . Stress: Not on file  Relationships  . Social Herbalist on phone: Not on file    Gets together: Not on file    Attends religious service: Not on file    Active member of club or organization: Not on file    Attends meetings of clubs or organizations: Not on file    Relationship status: Not on file  Other Topics Concern  . Not on file  Social History Narrative   Lives at home with children. ( 2 daughters)   Regular exercise-yes   Caffeine Use-no   GED   Not Working outside home     Review of Systems: A 12 point ROS discussed and pertinent positives are indicated in the HPI above.  All other systems are negative.  Review of Systems  Constitutional: Negative for chills and fever.  Respiratory: Negative for cough and shortness of breath.    Cardiovascular: Positive for chest pain (history of angina).  Gastrointestinal: Negative for abdominal pain, nausea and vomiting.  Genitourinary: Positive for hematuria. Negative for dysuria.  Musculoskeletal: Positive for arthralgias.  Neurological: Negative for dizziness and headaches.    Vital Signs: BP 99/62   Pulse 67   Temp 98.1 F (36.7 C) (Oral)   Resp 16   Ht 5' (1.524 m)   Wt 133 lb (60.3 kg)   SpO2 100%   BMI 25.97 kg/m   Physical Exam Vitals signs reviewed.  Constitutional:      General: She is not in acute distress. HENT:     Head: Normocephalic.     Mouth/Throat:     Mouth: Mucous membranes are moist.     Pharynx: Oropharynx is  clear. No oropharyngeal exudate or posterior oropharyngeal erythema.  Cardiovascular:     Rate and Rhythm: Normal rate and regular rhythm.  Pulmonary:     Effort: Pulmonary effort is normal.     Breath sounds: Normal breath sounds.  Abdominal:     General: There is no distension.     Palpations: Abdomen is soft.     Tenderness: There is no abdominal tenderness.  Skin:    General: Skin is warm and dry.  Neurological:     Mental Status: She is alert and oriented to person, place, and time.  Psychiatric:        Mood and Affect: Mood normal.        Behavior: Behavior normal.        Thought Content: Thought content normal.        Judgment: Judgment normal.      MD Evaluation Airway: WNL Heart: WNL Abdomen: WNL Chest/ Lungs: WNL ASA  Classification: 2 Mallampati/Airway Score: One   Imaging: Ct Chest High Resolution  Result Date: 07/08/2019 CLINICAL DATA:  Hemoptysis. EXAM: CT CHEST WITHOUT CONTRAST TECHNIQUE: Multidetector CT imaging of the chest was performed following the standard protocol without intravenous contrast. High resolution imaging of the lungs, as well as inspiratory and expiratory imaging, was performed. COMPARISON:  None. FINDINGS: Cardiovascular: Atherosclerotic calcification of the aorta. Heart size  normal. No pericardial effusion. Mediastinum/Nodes: No pathologically enlarged mediastinal or axillary lymph nodes. Hilar regions are difficult to definitively evaluate without IV contrast but appear grossly unremarkable. Esophagus is grossly unremarkable. Lungs/Pleura: Negative for subpleural reticulation, traction bronchiectasis/bronchiolectasis, ground-glass, architectural distortion or honeycombing. Minimal scarring in the right middle lobe and both lower lobes. No pleural fluid. Airway is unremarkable. Minimal air trapping. Upper Abdomen: Visualized portions of the liver, adrenal glands, kidneys, spleen, pancreas, stomach and bowel are unremarkable. Cholecystectomy. Upper abdominal lymph nodes are not enlarged by CT size criteria. Musculoskeletal: No worrisome lytic or sclerotic lesions. IMPRESSION: 1. No evidence of interstitial lung disease. No findings to explain the patient's hemoptysis. 2. Minimal air trapping is indicative of small airways disease. 3.  Aortic atherosclerosis (ICD10-170.0). Electronically Signed   By: Lorin Picket M.D.   On: 07/08/2019 11:56    Labs:  CBC: Recent Labs    08/11/18 0903 08/04/19 0615  WBC 9.8 6.3  HGB 14.3 14.8  HCT 43.1 43.2  PLT 344 382    COAGS: Recent Labs    08/04/19 0615  INR 1.0    BMP: No results for input(s): NA, K, CL, CO2, GLUCOSE, BUN, CALCIUM, CREATININE, GFRNONAA, GFRAA in the last 8760 hours.  Invalid input(s): CMP  LIVER FUNCTION TESTS: No results for input(s): BILITOT, AST, ALT, ALKPHOS, PROT, ALBUMIN in the last 8760 hours.  TUMOR MARKERS: No results for input(s): AFPTM, CEA, CA199, CHROMGRNA in the last 8760 hours.  Assessment and Plan:  53 y/o F with history of systemic sclerosis with new onset hematuria followed by nephrology who presents today for a random renal biopsy.  Patient has been NPO since 11 pm yesterday, she does not take blood thinning medications, she did not take any medications this morning but  states she typically takes several blood pressure medications due to a history of TIA and will need them post procedure. Afebrile, WBC 6.3,hgb 14.8, plt 382, INR 1.0.  Risks and benefits of random renal biopsy was discussed with the patient and/or patient's family including, but not limited to bleeding, infection, damage to adjacent structures or low yield requiring additional tests.  All  of the questions were answered and there is agreement to proceed.  Consent signed and in chart.   Thank you for this interesting consult.  I greatly enjoyed meeting Christa Vitatoe and look forward to participating in their care.  A copy of this report was sent to the requesting provider on this date.  Electronically Signed: Joaquim Nam, PA-C 08/04/2019, 7:42 AM   I spent a total of 15 Minutes   in face to face in clinical consultation, greater than 50% of which was counseling/coordinating care for random renal biopsy.

## 2019-08-04 NOTE — Procedures (Signed)
Interventional Radiology Procedure Note  Procedure: US Guided Biopsy of left kidney  Complications: Mild perinephric hemorrhage  Estimated Blood Loss: < 10 mL  Findings: 16 G core biopsy of left lower pole renal cortex performed under US guidance.  Two core samples obtained and sent to Pathology.  Post US shows some perinephric hemorrhage around LP of left kidney after biopsy.   Venetia Night. Kathlene Cote, M.D Pager:  [phone]

## 2019-08-04 NOTE — Progress Notes (Signed)
Pt states chest pain has resolved. No further complaints.

## 2019-08-04 NOTE — Sedation Documentation (Signed)
Called to give report, nurse unavailable. Will call back 

## 2019-08-04 NOTE — Discharge Instructions (Addendum)
Percutaneous Kidney Biopsy  A kidney biopsy is a procedure to remove small pieces of tissue from a kidney. In a percutaneous biopsy, the tissue is removed using a needle that is inserted through the skin. This procedure is done so that the tissue can be examined under a microscope and checked for disease or infection. Tell a health care provider about:  Any allergies you have.  All medicines you are taking, including vitamins, herbs, eye drops, creams, and over-the-counter medicines.  Any problems you or family members have had with anesthetic medicines.  Any blood disorders you have.  Any surgeries you have had.  Any medical conditions you have.  Whether you are pregnant or may be pregnant. What are the risks? Generally, this is a safe procedure. However, problems may occur, including:  Infection.  Bleeding.  Allergic reactions to medicines.  Damage to other structures or organs.  Swelling from a collection of clotted blood outside a blood vessel (hematoma).  Blood in the urine (hematuria). What happens before the procedure?  Follow instructions from your health care provider about eating or drinking restrictions.  Ask your health care provider about: ? Changing or stopping your regular medicines. This is especially important if you are taking diabetes medicines or blood thinners. ? Taking medicines such as aspirin and ibuprofen. These medicines can thin your blood. Do not take these medicines before your procedure if your health care provider instructs you not to.  You may be given antibiotic medicine to help prevent infection.  You will have blood and urine samples taken. This is to make sure that you do not have a condition where you should not have a biopsy.  Plan to have someone take you home from the hospital or clinic.  Ask your health care provider how your biopsy site will be marked or identified. What happens during the procedure?  To lower your risk of  infection: ? Your health care team will wash or sanitize their hands. ? Your skin will be washed with soap.  An IV tube will be inserted into one of your veins.  You will be given one or more of the following: ? A medicine to help you relax (sedative). ? A medicine to numb the area (local anesthetic).  You will lie on your abdomen. A firm pillow will be placed under your body to help push the kidneys closer to the surface of the skin. If you have a transplanted kidney, you will lie on your back.  The health care provider will mark the area where the needle will enter your skin.  An imaging test--such as an ultrasound, X-ray, CT scan, or MRI--will be used to locate the kidney. These images will also help the health care provider to guide the biopsy needle into the kidney.  You will be asked to hold your breath and stay still while the health care provider inserts the needle and removes the kidney tissue. ? You will need to hold your breath and stay still for 30-45 seconds. ? During the biopsy, you may hear a popping sound from the needle. ? You may also feel some pressure from the area where the needle is being inserted.  The needle may be inserted and removed 3 or 4 times to make sure that enough tissue is taken for testing.  A bandage (dressing) may be placed over the spot where the needle entered your skin (biopsy site). The procedure may vary among health care providers and hospitals. What happens after the procedure?  Your blood pressure, heart rate, breathing rate, and blood oxygen level will be monitored until the medicines you were given have worn off. °· You will need to lie on your back for 6-8 hours. °· You may have some pain or soreness near the biopsy site. °· You may have pink or cloudy urine from small amounts of blood. This is normal. °· You may have grogginess or fatigue if you were given a sedative. °· Do not drive for 24 hours if you were given a sedative. °· It is up to  you to get the results of your procedure. Ask your health care provider, or the department performing the procedure, when your results will be ready. °This information is not intended to replace advice given to you by your health care provider. Make sure you discuss any questions you have with your health care provider. °Document Released: 09/01/2004 Document Revised: 10/04/2017 Document Reviewed: 08/03/2016 °Elsevier Patient Education © 2020 [employer] °Moderate Conscious Sedation, Adult, Care After °These instructions provide you with information about caring for yourself after your procedure. Your health care provider may also give you more specific instructions. Your treatment has been planned according to current medical practices, but problems sometimes occur. Call your health care provider if you have any problems or questions after your procedure. °What can I expect after the procedure? °After your procedure, it is common: °· To feel sleepy for several hours. °· To feel clumsy and have poor balance for several hours. °· To have poor judgment for several hours. °· To vomit if you eat too soon. °Follow these instructions at home: °For at least 24 hours after the procedure: ° °· Do not: °? Participate in activities where you could fall or become injured. °? Drive. °? Use heavy machinery. °? Drink alcohol. °? Take sleeping pills or medicines that cause drowsiness. °? Make important decisions or sign legal documents. °? Take care of children on your own. °· Rest. °Eating and drinking °· Follow the diet recommended by your health care provider. °· If you vomit: °? Drink water, juice, or soup when you can drink without vomiting. °? Make sure you have little or no nausea before eating solid foods. °General instructions °· Have a responsible adult stay with you until you are awake and alert. °· Take over-the-counter and prescription medicines only as told by your health care provider. °· If you smoke, do not smoke  without supervision. °· Keep all follow-up visits as told by your health care provider. This is important. °Contact a health care provider if: °· You keep feeling nauseous or you keep vomiting. °· You feel light-headed. °· You develop a rash. °· You have a fever. °Get help right away if: °· You have trouble breathing. °This information is not intended to replace advice given to you by your health care provider. Make sure you discuss any questions you have with your health care provider. °Document Released: 08/12/2013 Document Revised: 10/04/2017 Document Reviewed: 02/11/2016 °Elsevier Patient Education © 2020 [employer]vier Inc. ° °

## 2019-08-07 ENCOUNTER — Other Ambulatory Visit: Payer: Self-pay | Admitting: Nephrology

## 2019-08-07 ENCOUNTER — Ambulatory Visit
Admission: RE | Admit: 2019-08-07 | Discharge: 2019-08-07 | Disposition: A | Discharge status: Home / Self Care | Payer: Medicaid Other | Source: Ambulatory Visit | Attending: Nephrology | Admitting: Nephrology

## 2019-08-07 DIAGNOSIS — S37019A Minor contusion of unspecified kidney, initial encounter: Secondary | ICD-10-CM

## 2019-08-10 ENCOUNTER — Other Ambulatory Visit: Payer: Self-pay

## 2019-08-10 ENCOUNTER — Ambulatory Visit (HOSPITAL_COMMUNITY)
Admission: RE | Admit: 2019-08-10 | Discharge: 2019-08-10 | Disposition: A | Discharge status: Home / Self Care | Payer: Medicaid Other | Source: Ambulatory Visit | Attending: Rheumatology | Admitting: Rheumatology

## 2019-08-10 DIAGNOSIS — M5136 Other intervertebral disc degeneration, lumbar region: Secondary | ICD-10-CM | POA: Diagnosis not present

## 2019-08-11 NOTE — Progress Notes (Signed)
MRIs showed no significant difference from her previous MRI of 2018.  She has some foraminal stenosis.  She may benefit from injections by Dr. Ernestina Patches.  If patient is interested please schedule an appointment with Dr. Ernestina Patches.

## 2019-08-12 ENCOUNTER — Other Ambulatory Visit: Payer: Self-pay | Admitting: Nephrology

## 2019-08-12 DIAGNOSIS — S37019A Minor contusion of unspecified kidney, initial encounter: Secondary | ICD-10-CM

## 2019-08-13 ENCOUNTER — Encounter: Payer: Self-pay | Admitting: Rheumatology

## 2019-08-13 ENCOUNTER — Ambulatory Visit (INDEPENDENT_AMBULATORY_CARE_PROVIDER_SITE_OTHER): Payer: Medicaid Other | Admitting: Rheumatology

## 2019-08-13 ENCOUNTER — Other Ambulatory Visit: Payer: Self-pay

## 2019-08-13 VITALS — BP 88/64 | HR 77 | Resp 12 | Ht 59.75 in | Wt 135.2 lb

## 2019-08-13 DIAGNOSIS — M818 Other osteoporosis without current pathological fracture: Secondary | ICD-10-CM

## 2019-08-13 DIAGNOSIS — Z8742 Personal history of other diseases of the female genital tract: Secondary | ICD-10-CM

## 2019-08-13 DIAGNOSIS — M51369 Other intervertebral disc degeneration, lumbar region without mention of lumbar back pain or lower extremity pain: Secondary | ICD-10-CM

## 2019-08-13 DIAGNOSIS — M5136 Other intervertebral disc degeneration, lumbar region: Secondary | ICD-10-CM | POA: Diagnosis not present

## 2019-08-13 DIAGNOSIS — M5030 Other cervical disc degeneration, unspecified cervical region: Secondary | ICD-10-CM | POA: Diagnosis not present

## 2019-08-13 DIAGNOSIS — D352 Benign neoplasm of pituitary gland: Secondary | ICD-10-CM

## 2019-08-13 DIAGNOSIS — I872 Venous insufficiency (chronic) (peripheral): Secondary | ICD-10-CM

## 2019-08-13 DIAGNOSIS — G894 Chronic pain syndrome: Secondary | ICD-10-CM

## 2019-08-13 DIAGNOSIS — Z8719 Personal history of other diseases of the digestive system: Secondary | ICD-10-CM

## 2019-08-13 DIAGNOSIS — M19042 Primary osteoarthritis, left hand: Secondary | ICD-10-CM

## 2019-08-13 DIAGNOSIS — M7918 Myalgia, other site: Secondary | ICD-10-CM

## 2019-08-13 DIAGNOSIS — Z8679 Personal history of other diseases of the circulatory system: Secondary | ICD-10-CM

## 2019-08-13 DIAGNOSIS — M5416 Radiculopathy, lumbar region: Secondary | ICD-10-CM | POA: Diagnosis not present

## 2019-08-13 DIAGNOSIS — M19041 Primary osteoarthritis, right hand: Secondary | ICD-10-CM | POA: Diagnosis not present

## 2019-08-13 DIAGNOSIS — I201 Angina pectoris with documented spasm: Secondary | ICD-10-CM

## 2019-08-14 ENCOUNTER — Ambulatory Visit
Admission: RE | Admit: 2019-08-14 | Discharge: 2019-08-14 | Disposition: A | Discharge status: Home / Self Care | Payer: Medicaid Other | Source: Ambulatory Visit | Attending: Nephrology | Admitting: Nephrology

## 2019-08-14 DIAGNOSIS — S37019A Minor contusion of unspecified kidney, initial encounter: Secondary | ICD-10-CM

## 2019-08-17 ENCOUNTER — Telehealth: Payer: Self-pay | Admitting: Rheumatology

## 2019-08-17 DIAGNOSIS — M545 Low back pain, unspecified: Secondary | ICD-10-CM

## 2019-08-17 DIAGNOSIS — M5441 Lumbago with sciatica, right side: Secondary | ICD-10-CM

## 2019-08-17 DIAGNOSIS — M5136 Other intervertebral disc degeneration, lumbar region: Secondary | ICD-10-CM

## 2019-08-17 DIAGNOSIS — G8929 Other chronic pain: Secondary | ICD-10-CM

## 2019-08-17 NOTE — Telephone Encounter (Signed)
Patient called stating Dr. Estanislado Pandy referred her to see Dr. Louanne Skye for her back pain.  Patient states his first available appointment is 10/21/19.  Patient states she called her neurologist Dr. Sabra Heck who stated if Dr. Estanislado Pandy sends a referral to his office she can be seen this week.  Patient states Dr. Ammie Ferrier phone # [phone]  Patient requested a return call.

## 2019-08-18 NOTE — Telephone Encounter (Signed)
Can we place referral for Dr. Sabra Heck, per patients request? Dr. Otho Ket first available is 10/21/2019.

## 2019-08-18 NOTE — Telephone Encounter (Signed)
ok 

## 2019-08-18 NOTE — Telephone Encounter (Signed)
Referral placed.

## 2019-08-18 NOTE — Telephone Encounter (Signed)
I think this needs to go to you?

## 2019-08-25 ENCOUNTER — Encounter (HOSPITAL_COMMUNITY): Payer: Self-pay | Admitting: Nephrology

## 2019-08-25 ENCOUNTER — Telehealth: Payer: Self-pay | Admitting: Rheumatology

## 2019-08-25 LAB — SURGICAL PATHOLOGY

## 2019-08-25 NOTE — Telephone Encounter (Signed)
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 Patient is asking if Dr. Estanislado Pandy will  Be able to treat this. Patient advised to have records sent to the office for review. Patient states she has not heard form neurologist. Patient advised referral has been placed and sent. Patient states she may see Dr. Flavia Shipper instead since she has not heard from the neurologist.

## 2019-08-25 NOTE — Telephone Encounter (Signed)
Patient called requesting a return call regarding her referral to the neurologist.

## 2019-09-02 ENCOUNTER — Ambulatory Visit: Payer: Medicaid Other | Admitting: Rheumatology

## 2019-09-16 ENCOUNTER — Other Ambulatory Visit: Payer: Self-pay | Admitting: Physician Assistant

## 2019-09-16 ENCOUNTER — Other Ambulatory Visit: Payer: Self-pay

## 2019-09-16 ENCOUNTER — Ambulatory Visit
Admission: RE | Admit: 2019-09-16 | Discharge: 2019-09-16 | Disposition: A | Discharge status: Home / Self Care | Payer: Medicaid Other | Source: Ambulatory Visit | Attending: Physician Assistant | Admitting: Physician Assistant

## 2019-09-16 DIAGNOSIS — R310 Gross hematuria: Secondary | ICD-10-CM

## 2019-10-06 HISTORY — PX: OTHER SURGICAL HISTORY: SHX169

## 2019-10-07 ENCOUNTER — Other Ambulatory Visit: Payer: Self-pay | Admitting: Physician Assistant

## 2019-10-07 DIAGNOSIS — R1314 Dysphagia, pharyngoesophageal phase: Secondary | ICD-10-CM

## 2019-10-13 ENCOUNTER — Other Ambulatory Visit: Payer: Medicaid Other

## 2019-10-19 ENCOUNTER — Ambulatory Visit
Admission: RE | Admit: 2019-10-19 | Discharge: 2019-10-19 | Disposition: A | Discharge status: Home / Self Care | Payer: Medicaid Other | Source: Ambulatory Visit | Attending: Physician Assistant | Admitting: Physician Assistant

## 2019-10-19 DIAGNOSIS — R1314 Dysphagia, pharyngoesophageal phase: Secondary | ICD-10-CM

## 2019-10-21 ENCOUNTER — Encounter: Payer: Self-pay | Admitting: Specialist

## 2019-10-21 ENCOUNTER — Ambulatory Visit (INDEPENDENT_AMBULATORY_CARE_PROVIDER_SITE_OTHER): Payer: Medicaid Other

## 2019-10-21 ENCOUNTER — Ambulatory Visit (INDEPENDENT_AMBULATORY_CARE_PROVIDER_SITE_OTHER): Payer: Medicaid Other | Admitting: Specialist

## 2019-10-21 ENCOUNTER — Other Ambulatory Visit: Payer: Self-pay

## 2019-10-21 VITALS — BP 94/66 | HR 86 | Ht 60.0 in | Wt 135.0 lb

## 2019-10-21 DIAGNOSIS — M5136 Other intervertebral disc degeneration, lumbar region: Secondary | ICD-10-CM | POA: Diagnosis not present

## 2019-10-21 DIAGNOSIS — M7072 Other bursitis of hip, left hip: Secondary | ICD-10-CM | POA: Diagnosis not present

## 2019-10-21 DIAGNOSIS — M51369 Other intervertebral disc degeneration, lumbar region without mention of lumbar back pain or lower extremity pain: Secondary | ICD-10-CM

## 2019-10-21 DIAGNOSIS — M545 Low back pain, unspecified: Secondary | ICD-10-CM

## 2019-10-21 DIAGNOSIS — M25559 Pain in unspecified hip: Secondary | ICD-10-CM

## 2019-10-21 DIAGNOSIS — M7062 Trochanteric bursitis, left hip: Secondary | ICD-10-CM

## 2019-10-21 NOTE — Patient Instructions (Signed)
Avoid frequent bending and stooping  No lifting greater than 10 lbs. May use ice or moist heat for pain. Weight loss is of benefit. Best medication for lumbar disc disease is arthritis medications like motrin, celebrex and naprosyn. Exercise is important to improve your indurance and does allow people to function better inspite of back pain.  Hemp CBD capsules, amazon.com 5,000-7,000 mg per bottle, 60 capsules per bottle, take one capsule twice a day. Cane in the left hand to use with left leg weight bearing. Follow-Up Instructions: No follow-ups on file.  Stretching exercises, for left hip greater trochanteric bursitis and for left ischial bursitis. MRI left hip to evaluate for cause of left hip chronic pain. R/O stress injury or AVN(avascular necrosis of the left hip)

## 2019-10-21 NOTE — Progress Notes (Signed)
Office Visit Note   Patient: Virginia Shaw           Date of Birth: [birth year]           MRN: [id number] Visit Date: 10/21/2019              Requested by: Bo Merino, MD [address],  [place] [address] PCP: [name], MD   Assessment & Plan: Visit Diagnoses:  1. Low back pain without sciatica, unspecified back pain laterality, unspecified chronicity   2. Hip pain   3. Degenerative disc disease, lumbar   4. Ischial bursitis, left   5. Greater trochanteric bursitis of left hip     Plan: Avoid frequent bending and stooping  No lifting greater than 10 lbs. May use ice or moist heat for pain. Weight loss is of benefit. Best medication for lumbar disc disease is arthritis medications like motrin, celebrex and naprosyn. Exercise is important to improve your indurance and does allow people to function better inspite of back pain.  Hemp CBD capsules, amazon.com 5,000-7,000 mg per bottle, 60 capsules per bottle, take one capsule twice a day. Cane in the left hand to use with left leg weight bearing. Follow-Up Instructions: No follow-ups on file.  Stretching exercises, for left hip greater trochanteric bursitis and for left ischial bursitis. MRI left hip to evaluate for cause of left hip chronic pain. R/O stress injury or AVN(avascular necrosis of the left hip)  Follow-Up Instructions: Return in about 3 weeks (around 11/11/2019).   Orders:  Orders Placed This Encounter  Procedures  . XR Lumb Spine Flex&Ext Only  . MR Hip Left w/o contrast  . XR HIP UNILAT W OR W/O PELVIS 2-3 VIEWS LEFT   No orders of the defined types were placed in this encounter.     Procedures: No procedures performed   Clinical Data: Findings:  CLINICAL DATA:  Back pain radiating to the left groin.  EXAM: MRI LUMBAR SPINE WITHOUT CONTRAST  TECHNIQUE: Multiplanar, multisequence MR imaging of the lumbar spine was performed. No intravenous contrast was  administered.  COMPARISON:  CT scan 08/07/2019, lumbar radiographs 06/01/2019, and lumbar MRI from 01/03/2017.  FINDINGS: Segmentation: The lowest lumbar type non-rib-bearing vertebra is labeled as L5.  Alignment:  3 mm degenerative retrolisthesis at L4-5.  Vertebrae: Disc desiccation at L4-5 and L5-S1 with loss of disc height at both levels.  Conus medullaris and cauda equina: Conus extends to the L1 level. Conus and cauda equina appear normal.  Paraspinal and other soft tissues: Left kidney lower pole perinephric hematoma is again observed with accentuated T1 signal and intermediate T2 signal. This does not appear larger than it did on 08/07/2019.  Disc levels:  T12-L1: Unremarkable.  L1-2: Unremarkable.  L2-3: Unremarkable.  L3-4: Unremarkable.  L4-5: Borderline left foraminal stenosis due to facet arthropathy, disc bulge, small left lateral recess disc protrusion, and mild intervertebral spurring. The bulge abuts the left L5 nerve roots in the lateral recess does not appear to significantly compress the nerve roots. Left laminectomy noted.  L5-S1: Mild bilateral foraminal stenosis due to intervertebral and facet spurring along with mild disc bulge. Findings at this level are stable.  IMPRESSION: 1. Lumbar spondylosis and degenerative disc disease leading to mild impingement in the neural foramina at L5-S1, and borderline impingement at L4-5, not appreciably changed from the 01/03/2017 exam. 2. Essentially stable appearance of the left kidney lower pole perinephric hematoma.   Electronically Signed   By: Van Clines  M.D.   On: 08/11/2019 09:19    Subjective: Chief Complaint  Patient presents with  . Lower Back - Pain    53 year old female with past history of lumbar fusion by Dr. Trenton Gammon and prevous Roselie Awkward Chiari decompression. She has pain in the back with extension with radiation down the left posterior leg with catching in the  left posterior thigh and into the top of the left dorsolateral foot. She keeps moving even when it hurts. She is limited at times to walking short distances.  She has pain with leaning forward and with twisting. And lifting weights greater than 8 lbs since 2015. Has bladder issues better with meds for pain. Had kidney issues with thinning of the membrane seen by Dr. Rosezella Florida with [facility].    Review of Systems  Constitutional: Negative.   HENT: Negative.   Eyes: Negative.   Respiratory: Negative.   Cardiovascular: Negative.   Gastrointestinal: Negative.   Endocrine: Negative.   Genitourinary: Negative.   Musculoskeletal: Negative.   Skin: Negative.   Allergic/Immunologic: Negative.   Neurological: Negative.   Hematological: Negative.   Psychiatric/Behavioral: Negative.      Objective: Vital Signs: BP 94/66 (BP Location: Left Arm, Patient Position: Sitting)   Pulse 86   Ht 5' (1.524 m)   Wt 135 lb (61.2 kg)   BMI 26.37 kg/m   Physical Exam Constitutional:      Appearance: She is well-developed.  HENT:     Head: Normocephalic and atraumatic.  Eyes:     Pupils: Pupils are equal, round, and reactive to light.  Pulmonary:     Effort: Pulmonary effort is normal.     Breath sounds: Normal breath sounds.  Abdominal:     General: Bowel sounds are normal.     Palpations: Abdomen is soft.  Musculoskeletal:     Cervical back: Normal range of motion and neck supple.     Lumbar back: Positive left straight leg raise test. Negative right straight leg raise test.  Skin:    General: Skin is warm and dry.  Neurological:     Mental Status: She is alert and oriented to person, place, and time.  Psychiatric:        Behavior: Behavior normal.        Thought Content: Thought content normal.        Judgment: Judgment normal.     Back Exam   Tenderness  The patient is experiencing tenderness in the lumbar.  Range of Motion  Extension: normal  Flexion: abnormal   Lateral bend right: abnormal  Lateral bend left: abnormal  Rotation right: abnormal  Rotation left: abnormal   Muscle Strength  Right Quadriceps:  5/5  Left Quadriceps:  5/5  Right Hamstrings:  5/5  Left Hamstrings:  5/5   Tests  Straight leg raise right: negative Straight leg raise left: positive  Reflexes  Patellar: 2/4 Achilles: 2/4  Other  Toe walk: abnormal Heel walk: abnormal Gait: antalgic  Erythema: no back redness Scars: absent  Comments:  Tender left ischial tuberosity, unable to sit comfortably on left buttock, tender left greater trochanter.Pain with ROM of the left hip with flexion and extension.        Specialty Comments:  No specialty comments available.  Imaging: No results found.   PMFS History: Patient Active Problem List   Diagnosis Date Noted  . DDD (degenerative disc disease), cervical 08/11/2018  . DDD (degenerative disc disease), lumbar 08/11/2018  . Somatic complaints, multiple 03/14/2017  .  Chronic pain syndrome 08/25/2014  . Irritable bowel syndrome (IBS) 01/12/2014  . 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 Chronic abdominal pain 01/11/2014  . Chronic venous insufficiency 10/30/2013  . Varicose veins 10/30/2013  . Chest tightness 03/25/2012  . 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 RECTAL BLEEDING 08/26/2008  . UTI'S, RECURRENT 08/26/2008   Past Medical History:  Diagnosis Date  . Abnormal involuntary movements(781.0)   . Allergic rhinitis, cause unspecified   . Anxiety state, unspecified   . Cervical spondylosis without myelopathy   . Disturbance of skin sensation   . Disturbance of skin sensation   . GERD (gastroesophageal reflux disease)   . History of chicken pox   . Irritable bowel syndrome   . Lumbago   . Myalgia and myositis, unspecified   . Osteoporosis   . Other and unspecified angina pectoris   . Other malaise and fatigue   . 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 Somatization disorder   . Spasm of  muscle   . Systemic sclerosis ([place])   . Unspecified hereditary and idiopathic peripheral neuropathy     Family History  Problem Relation Age of Onset  . Hypertension Father   . Hyperlipidemia Father   . Diabetes Father   . Stroke Father   . Cancer Maternal Uncle        lung  . Cancer Paternal Aunt        lung  . Migraines Mother   . Heart attack Paternal Grandmother   . Heart attack Paternal Grandfather   . Hypertension Paternal Grandfather   . Migraines Sister     Past Surgical History:  Procedure Laterality Date  . ABDOMINAL HYSTERECTOMY  1997  . arnold chiari malformation  2013  . BACK SURGERY  2001, 2011,2013   Lumbar x 2  . BLADDER SURGERY  2011  . CARDIAC CATHETERIZATION    . CATARACT EXTRACTION    . CERVICAL SPINE SURGERY  2013  . CHOLECYSTECTOMY    . COLONOSCOPY  2009   "Normal" Dr Sharlett Iles  . COLONOSCOPY  2002   "Normal" Dr Freddrick March  . COLONOSCOPY  ~2013   Dr Ferdinand Lango.  "Chest pain with the prep" = aborted  . CRANIECTOMY SUBOCCIPITAL W/ CERVICAL LAMINECTOMY / CHIARI  2012   Dr Annette Stable, Chiari decompression with suboccipital craniectomy and  . CYSTOCELE REPAIR  2002   Dr Gaetano Net  . OOPHORECTOMY  1999, 2000  . PITUITARY EXCISION  2010   pituitary cyst removed  . RECTOCELE REPAIR  2002   Dr. Gaetano Net  . RENAL BIOPSY  08/04/2019  . tear duct tubes  2005  . TUBAL LIGATION     Social History   Occupational History    Employer: UNEMPLOYED  Tobacco Use  . Smoking status: Never Smoker  . Smokeless tobacco: Never Used  Substance and Sexual Activity  . Alcohol use: No  . Drug use: No  . Sexual activity: Never    Birth control/protection: Abstinence

## 2019-10-23 ENCOUNTER — Telehealth: Payer: Self-pay | Admitting: Specialist

## 2019-10-23 NOTE — Telephone Encounter (Signed)
Patient called advised she is experiencing more neck and back of head pain while doing the exercises that Dr Louanne Skye gave her to do. Patient said her right shoulder is also hurting. Patient asked is there anything she can do to help control the pain? The number to contact patient is [phone]

## 2019-10-23 NOTE — Telephone Encounter (Signed)
Patient called advised she is experiencing more neck and back of head pain while doing the exercises that Dr Louanne Skye gave her to do. Patient said her right shoulder is also hurting. Patient asked is there anything she can do to help control the pain? The number to contact patient is [phone]

## 2019-11-11 NOTE — Progress Notes (Signed)
Virtual Visit via Telephone Note  I connected with Virginia Shaw on 11/12/19 at  9:45 AM EST by telephone and verified that I am speaking with the correct person using two identifiers.  Location: Patient: Home  Provider: Clinic  This service was conducted via virtual visit. The patient was located at home. I was located in my office.  Consent was obtained prior to the virtual visit and is aware of possible charges through their insurance for this visit.  The patient is an established patient.  Dr. Estanislado Pandy, MD conducted the virtual visit and Hazel Sams, PA-C acted as scribe during the service.  Office staff helped with scheduling follow up visits after the service was conducted.    I discussed the limitations, risks, security and privacy concerns of performing an evaluation and management service by telephone and the availability of in person appointments. I also discussed with the patient that there may be a patient responsible charge related to this service. The patient expressed understanding and agreed to proceed.  CC: Neck pain  History of Present Illness: Patient is a 54 year old female with a past medical history of osteoarthritis, myofascial pain, and DDD. She has been experiencing increased neck pain and stiffness.  She is experiencing right sided radiculopathy.  She states at times she experiences "shaking" and muscle cramps in the right hand.  She has a history of a C-spine fusion performed by Dr. Trenton Gammon in the past.  She was evaluated by Dr. Louanne Skye for lower back pain, and she was referred to PT and provided back exercises to perform. She states she did not discuss her neck pain with Dr. Louanne Skye.    Review of Systems  Constitutional: Positive for malaise/fatigue. Negative for fever.  HENT: Negative for ear pain and tinnitus.   Eyes: Positive for pain. Negative for photophobia, discharge and redness.  Respiratory: Negative for shortness of breath and wheezing.   Cardiovascular:  Negative for chest pain and palpitations.  Gastrointestinal: Positive for constipation. Negative for blood in stool and diarrhea.  Musculoskeletal: Positive for back pain, joint pain and neck pain. Negative for myalgias.  Skin: Negative for rash.  Neurological: Positive for headaches. Negative for dizziness.  Psychiatric/Behavioral: Negative for depression. The patient has insomnia. The patient is not nervous/anxious.       Observations/Objective: Physical Exam  Constitutional: She is oriented to person, place, and time.  Neurological: She is alert and oriented to person, place, and time.  Psychiatric: Mood, memory, affect and judgment normal.   Patient reports morning stiffness for 24  hours.   Patient reports nocturnal pain.  Difficulty dressing/grooming: Denies Difficulty climbing stairs: Denies Difficulty getting out of chair: Reports Difficulty using hands for taps, buttons, cutlery, and/or writing: Reports   Assessment and Plan: Visit Diagnoses: Primary osteoarthritis of both hands: She has intermittent pain in both hands but no inflammation.  Joint protection and muscle strengthening were discussed.  She was advised to notify us if she develops increased discomfort or inflammation.  She will follow up in 6 months.   DDD (degenerative disc disease), cervical: She has been experiencing increased neck pain and stiffness over the past several weeks.  She has right sided radiculopathy and occasionally experiences shaking and muscle cramps in the right hand.  She was advised to follow up with Dr. Louanne Skye for further evaluation.   DDD (degenerative disc disease), lumbar-She was encouraged to continue to follow up with Dr. Louanne Skye.   Radiculopathy, lumbar region: She had a MRI of the  lumbar spine on 08/10/19: Lumbar spondylosis and degenerative disc disease leading to mild impingement in the neural foramina at L5-S1, and borderline impingement at L4-5, not appreciably changed from the  01/03/2017 Exam.  She was referred to Dr. Louanne Skye who recommended taking NSAIDs, CBD, physical therapy, and back exercises.  She was encouraged to continue to follow up with Dr. Louanne Skye.    Myofascial pain-She has generalized muscle aches and muscle tenderness due to myofascial pain syndrome.  She has been experiencing increased muscle cramps and muscle spasms.  We recommended trying magnesium malate 250 mg po at bedtime and if she tolerates that dose she can increase to 500 mg at bedtime.    Chronic pain syndrome  Other osteoporosis without current pathological fracture - DEXA 07/05/17: Left femoral neck -2.8.    Other medical conditions are listed as follows:   History of ovarian cyst  Pituitary adenoma (HCC)  Chronic venous insufficiency  Coronary artery spasm (HCC)  History of IBS  History of varicose veins  History of gastroesophageal reflux (GERD)  Follow Up Instructions: She will follow up in 6 months   I discussed the assessment and treatment plan with the patient. The patient was provided an opportunity to ask questions and all were answered. The patient agreed with the plan and demonstrated an understanding of the instructions.   The patient was advised to call back or seek an in-person evaluation if the symptoms worsen or if the condition fails to improve as anticipated.  I provided 15 minutes of non-face-to-face time during this encounter.   Bo Merino, MD   Scribed by-  Hazel Sams, PA-C

## 2019-11-12 ENCOUNTER — Telehealth (INDEPENDENT_AMBULATORY_CARE_PROVIDER_SITE_OTHER): Payer: Medicaid Other | Admitting: Rheumatology

## 2019-11-12 ENCOUNTER — Encounter: Payer: Self-pay | Admitting: Rheumatology

## 2019-11-12 ENCOUNTER — Telehealth: Payer: Self-pay | Admitting: Rheumatology

## 2019-11-12 ENCOUNTER — Other Ambulatory Visit: Payer: Self-pay

## 2019-11-12 DIAGNOSIS — M19041 Primary osteoarthritis, right hand: Secondary | ICD-10-CM

## 2019-11-12 DIAGNOSIS — Z8679 Personal history of other diseases of the circulatory system: Secondary | ICD-10-CM

## 2019-11-12 DIAGNOSIS — M7918 Myalgia, other site: Secondary | ICD-10-CM

## 2019-11-12 DIAGNOSIS — M5030 Other cervical disc degeneration, unspecified cervical region: Secondary | ICD-10-CM | POA: Diagnosis not present

## 2019-11-12 DIAGNOSIS — M19042 Primary osteoarthritis, left hand: Secondary | ICD-10-CM

## 2019-11-12 DIAGNOSIS — G894 Chronic pain syndrome: Secondary | ICD-10-CM

## 2019-11-12 DIAGNOSIS — Z8742 Personal history of other diseases of the female genital tract: Secondary | ICD-10-CM

## 2019-11-12 DIAGNOSIS — D352 Benign neoplasm of pituitary gland: Secondary | ICD-10-CM

## 2019-11-12 DIAGNOSIS — M51369 Other intervertebral disc degeneration, lumbar region without mention of lumbar back pain or lower extremity pain: Secondary | ICD-10-CM

## 2019-11-12 DIAGNOSIS — M5136 Other intervertebral disc degeneration, lumbar region: Secondary | ICD-10-CM | POA: Diagnosis not present

## 2019-11-12 DIAGNOSIS — I201 Angina pectoris with documented spasm: Secondary | ICD-10-CM

## 2019-11-12 DIAGNOSIS — M5416 Radiculopathy, lumbar region: Secondary | ICD-10-CM

## 2019-11-12 DIAGNOSIS — Z8719 Personal history of other diseases of the digestive system: Secondary | ICD-10-CM

## 2019-11-12 DIAGNOSIS — I872 Venous insufficiency (chronic) (peripheral): Secondary | ICD-10-CM

## 2019-11-12 NOTE — Telephone Encounter (Signed)
Patient would like for you to set her up an appointment with Dr. Irven [place] office since he did her surgery. Please call to advise.

## 2019-11-13 ENCOUNTER — Telehealth: Payer: Self-pay | Admitting: Rheumatology

## 2019-11-13 DIAGNOSIS — M5030 Other cervical disc degeneration, unspecified cervical region: Secondary | ICD-10-CM

## 2019-11-13 NOTE — Telephone Encounter (Signed)
Referral placed for Dr. Trenton Gammon. Referral and office note faxed to Dr. Irven [place] office.

## 2019-11-13 NOTE — Telephone Encounter (Signed)
I called Dr. Irven [place] office and left message on machine for Caryl Pina Scientist, research (life sciences) for Dr. Trenton Gammon) to please call patient to schedule follow up appointment. I advised patient and she verbalized understanding.

## 2019-11-13 NOTE — Telephone Encounter (Signed)
Patient had a c-spine fusion in the past by Dr. Trenton Gammon. Okay to place referral for eval and treatment of neck pain?

## 2019-11-13 NOTE — Telephone Encounter (Signed)
Ok to place referral.

## 2019-11-13 NOTE — Telephone Encounter (Signed)
Patient scheduled an appointment with Dr. Trenton Gammon on 12/03/19. Patient requesting order with diagnosis, along with office notes to be sent to their office for appointment. Scheduler there advised patient to make sure she had info for her appointment.

## 2019-11-16 ENCOUNTER — Ambulatory Visit
Admission: RE | Admit: 2019-11-16 | Discharge: 2019-11-16 | Disposition: A | Discharge status: Home / Self Care | Payer: Medicaid Other | Source: Ambulatory Visit | Attending: Specialist | Admitting: Specialist

## 2019-11-16 ENCOUNTER — Other Ambulatory Visit: Payer: Self-pay

## 2019-11-16 DIAGNOSIS — M545 Low back pain, unspecified: Secondary | ICD-10-CM

## 2019-11-16 DIAGNOSIS — M25559 Pain in unspecified hip: Secondary | ICD-10-CM

## 2019-11-19 ENCOUNTER — Encounter: Payer: Self-pay | Admitting: Specialist

## 2019-11-19 ENCOUNTER — Ambulatory Visit (INDEPENDENT_AMBULATORY_CARE_PROVIDER_SITE_OTHER): Payer: Medicaid Other | Admitting: Specialist

## 2019-11-19 ENCOUNTER — Other Ambulatory Visit: Payer: Self-pay

## 2019-11-19 VITALS — BP 96/68 | HR 66 | Ht 60.0 in | Wt 135.0 lb

## 2019-11-19 DIAGNOSIS — M5440 Lumbago with sciatica, unspecified side: Secondary | ICD-10-CM | POA: Diagnosis not present

## 2019-11-19 DIAGNOSIS — N23 Unspecified renal colic: Secondary | ICD-10-CM | POA: Diagnosis not present

## 2019-11-19 DIAGNOSIS — M5136 Other intervertebral disc degeneration, lumbar region: Secondary | ICD-10-CM

## 2019-11-19 NOTE — Progress Notes (Signed)
Office Visit Note   Patient: Virginia Shaw           Date of Birth: 06/17/1966           MRN: [id number] Visit Date: 11/19/2019              Requested by: [name], [place],  [place] [address] PCP: [name], MD   Assessment & Plan: Visit Diagnoses:  1. Acute bilateral low back pain with sciatica, sciatica laterality unspecified   2. Degenerative disc disease, lumbar   3. Kidney pain     Plan: Avoid frequent bending and stooping  No lifting greater than 10 lbs. May use ice or moist heat for pain. Weight loss is of benefit. Best medication for lumbar disc disease is arthritis medications like motrin, celebrex and naprosyn. But you can't take these due to renal perinephric hematoma and renal disease. Exercise is important to improve your indurance and does allow people to function better inspite of back pain.  Well padded shoes help. Hot showers in the AM.  Injection with steroid may be of benefit. Hemp CBD capsules, amazon.com 5,000-7,000 mg per bottle, 60 capsules per bottle, take one capsule twice a day.   Follow-Up Instructions: Return in about 4 weeks (around 12/17/2019).   Orders:  No orders of the defined types were placed in this encounter.  No orders of the defined types were placed in this encounter.     Procedures: No procedures performed   Clinical Data: No additional findings.   Subjective: Chief Complaint  Patient presents with  . Lower Back - Follow-up    HPI  Review of Systems  Constitutional: Negative.   HENT: Negative.   Eyes: Negative.   Respiratory: Negative.   Cardiovascular: Negative.   Gastrointestinal: Negative.   Endocrine: Negative.   Genitourinary: Negative.   Musculoskeletal: Negative.   Skin: Negative.   Allergic/Immunologic: Negative.   Neurological: Negative.   Hematological: Negative.   Psychiatric/Behavioral: Negative.      Objective: Vital Signs: BP 96/68 (BP  Location: Left Arm, Patient Position: Sitting)   Pulse 66   Ht 5' (1.524 m)   Wt 135 lb (61.2 kg)   BMI 26.37 kg/m   Physical Exam Constitutional:      Appearance: She is well-developed.  HENT:     Head: Normocephalic and atraumatic.  Eyes:     Pupils: Pupils are equal, round, and reactive to light.  Pulmonary:     Effort: Pulmonary effort is normal.     Breath sounds: Normal breath sounds.  Abdominal:     General: Bowel sounds are normal.     Palpations: Abdomen is soft.  Musculoskeletal:     Cervical back: Normal range of motion and neck supple.     Lumbar back: Negative right straight leg raise test and negative left straight leg raise test.  Skin:    General: Skin is warm and dry.  Neurological:     Mental Status: She is alert and oriented to person, place, and time.  Psychiatric:        Behavior: Behavior normal.        Thought Content: Thought content normal.        Judgment: Judgment normal.     Back Exam   Tenderness  The patient is experiencing tenderness in the lumbar.  Range of Motion  Extension: abnormal  Flexion: abnormal  Lateral bend right: abnormal  Lateral bend left: abnormal  Rotation right:  abnormal  Rotation left: abnormal   Muscle Strength  Right Quadriceps:  5/5  Left Quadriceps:  5/5  Right Hamstrings:  5/5   Tests  Straight leg raise right: negative Straight leg raise left: negative  Reflexes  Patellar: 2/4 Achilles: 2/4  Other  Toe walk: normal Heel walk: normal      Specialty Comments:  No specialty comments available.  Imaging: No results found.   PMFS History: Patient Active Problem List   Diagnosis Date Noted  . DDD (degenerative disc disease), cervical 08/11/2018  . DDD (degenerative disc disease), lumbar 08/11/2018  . Somatic complaints, multiple 03/14/2017  . Chronic pain syndrome 08/25/2014  . Irritable bowel syndrome (IBS) 01/12/2014  . 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 Chronic abdominal pain  01/11/2014  . Chronic venous insufficiency 10/30/2013  . Varicose veins 10/30/2013  . Chest tightness 03/25/2012  . 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 RECTAL BLEEDING 08/26/2008  . UTI'S, RECURRENT 08/26/2008   Past Medical History:  Diagnosis Date  . Abnormal involuntary movements(781.0)   . Allergic rhinitis, cause unspecified   . Anxiety state, unspecified   . Cervical spondylosis without myelopathy   . Disturbance of skin sensation   . Disturbance of skin sensation   . GERD (gastroesophageal reflux disease)   . History of chicken pox   . Irritable bowel syndrome   . Lumbago   . Myalgia and myositis, unspecified   . Osteoporosis   . Other and unspecified angina pectoris   . Other malaise and fatigue   . 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 Somatization disorder   . Spasm of muscle   . Systemic sclerosis ([place])   . Unspecified hereditary and idiopathic peripheral neuropathy     Family History  Problem Relation Age of Onset  . Hypertension Father   . Hyperlipidemia Father   . Diabetes Father   . Stroke Father   . Cancer Maternal Uncle        lung  . Cancer Paternal Aunt        lung  . Migraines Mother   . Heart attack Paternal Grandmother   . Heart attack Paternal Grandfather   . Hypertension Paternal Grandfather   . Migraines Sister     Past Surgical History:  Procedure Laterality Date  . ABDOMINAL HYSTERECTOMY  1997  . arnold chiari malformation  2013  . BACK SURGERY  2001, 2011,2013   Lumbar x 2  . barium swallow test  10/2019  . BLADDER SURGERY  2011  . CARDIAC CATHETERIZATION    . CATARACT EXTRACTION    . CERVICAL SPINE SURGERY  2013  . CHOLECYSTECTOMY    . COLONOSCOPY  2009   "Normal" Dr Sharlett Iles  . COLONOSCOPY  2002   "Normal" Dr Freddrick March  . COLONOSCOPY  ~2013   Dr Ferdinand Lango.  "Chest pain with the prep" = aborted  . CRANIECTOMY SUBOCCIPITAL W/ CERVICAL LAMINECTOMY / CHIARI  2012   Dr Annette Stable, Chiari decompression with suboccipital craniectomy  and  . CYSTOCELE REPAIR  2002   Dr Gaetano Net  . OOPHORECTOMY  1999, 2000  . PITUITARY EXCISION  2010   pituitary cyst removed  . RECTOCELE REPAIR  2002   Dr. Gaetano Net  . RENAL BIOPSY  08/04/2019  . tear duct tubes  2005  . TUBAL LIGATION     Social History   Occupational History    Employer: UNEMPLOYED  Tobacco Use  . Smoking status: Never Smoker  . Smokeless tobacco: Never Used  Substance and Sexual  Activity  . Alcohol use: No  . Drug use: No  . Sexual activity: Never    Birth control/protection: Abstinence

## 2019-11-19 NOTE — Patient Instructions (Signed)
Avoid frequent bending and stooping  No lifting greater than 10 lbs. May use ice or moist heat for pain. Weight loss is of benefit. Best medication for lumbar disc disease is arthritis medications like motrin, celebrex and naprosyn. But you can't take these due to renal perinephric hematoma and renal disease. Exercise is important to improve your indurance and does allow people to function better inspite of back pain.  Well padded shoes help. Hot showers in the AM.  Injection with steroid may be of benefit. Hemp CBD capsules, amazon.com 5,000-7,000 mg per bottle, 60 capsules per bottle, take one capsule twice a day.  Follow-Up Instructions: No follow-ups on file.

## 2019-12-15 ENCOUNTER — Other Ambulatory Visit: Payer: Self-pay | Admitting: Gastroenterology

## 2019-12-16 NOTE — Progress Notes (Signed)
Attempted to obtain medical history via telephone, unable to reach at this time. I left a voicemail to return pre surgical testing department's phone call.  

## 2019-12-18 ENCOUNTER — Other Ambulatory Visit: Payer: Self-pay

## 2019-12-18 ENCOUNTER — Encounter (HOSPITAL_COMMUNITY): Payer: Self-pay | Admitting: Gastroenterology

## 2019-12-18 ENCOUNTER — Other Ambulatory Visit: Payer: Self-pay | Admitting: Gastroenterology

## 2019-12-18 ENCOUNTER — Other Ambulatory Visit (HOSPITAL_COMMUNITY)
Admission: RE | Admit: 2019-12-18 | Discharge: 2019-12-18 | Disposition: A | Discharge status: Home / Self Care | Payer: Medicaid Other | Source: Ambulatory Visit | Attending: Gastroenterology | Admitting: Gastroenterology

## 2019-12-18 DIAGNOSIS — Z01812 Encounter for preprocedural laboratory examination: Secondary | ICD-10-CM | POA: Insufficient documentation

## 2019-12-18 DIAGNOSIS — Z20822 Contact with and (suspected) exposure to covid-19: Secondary | ICD-10-CM | POA: Insufficient documentation

## 2019-12-18 LAB — SARS CORONAVIRUS 2 (TAT 6-24 HRS): SARS Coronavirus 2: NEGATIVE

## 2019-12-22 ENCOUNTER — Ambulatory Visit (HOSPITAL_COMMUNITY)
Admission: RE | Admit: 2019-12-22 | Discharge: 2019-12-22 | Disposition: A | Discharge status: Home / Self Care | Payer: Medicaid Other | Attending: Gastroenterology | Admitting: Gastroenterology

## 2019-12-22 ENCOUNTER — Encounter (HOSPITAL_COMMUNITY): Payer: Self-pay | Admitting: Gastroenterology

## 2019-12-22 ENCOUNTER — Ambulatory Visit (HOSPITAL_COMMUNITY): Payer: Medicaid Other | Admitting: Registered Nurse

## 2019-12-22 ENCOUNTER — Encounter (HOSPITAL_COMMUNITY)
Admission: RE | Disposition: A | Discharge status: Home / Self Care | Payer: Self-pay | Source: Home / Self Care | Attending: Gastroenterology

## 2019-12-22 ENCOUNTER — Other Ambulatory Visit: Payer: Self-pay

## 2019-12-22 DIAGNOSIS — K222 Esophageal obstruction: Secondary | ICD-10-CM | POA: Diagnosis not present

## 2019-12-22 DIAGNOSIS — Z8371 Family history of colonic polyps: Secondary | ICD-10-CM | POA: Insufficient documentation

## 2019-12-22 DIAGNOSIS — K3189 Other diseases of stomach and duodenum: Secondary | ICD-10-CM | POA: Diagnosis not present

## 2019-12-22 DIAGNOSIS — K581 Irritable bowel syndrome with constipation: Secondary | ICD-10-CM | POA: Insufficient documentation

## 2019-12-22 DIAGNOSIS — F419 Anxiety disorder, unspecified: Secondary | ICD-10-CM | POA: Diagnosis not present

## 2019-12-22 DIAGNOSIS — Z8249 Family history of ischemic heart disease and other diseases of the circulatory system: Secondary | ICD-10-CM | POA: Insufficient documentation

## 2019-12-22 DIAGNOSIS — K219 Gastro-esophageal reflux disease without esophagitis: Secondary | ICD-10-CM | POA: Insufficient documentation

## 2019-12-22 DIAGNOSIS — Z90710 Acquired absence of both cervix and uterus: Secondary | ICD-10-CM | POA: Diagnosis not present

## 2019-12-22 DIAGNOSIS — R1310 Dysphagia, unspecified: Secondary | ICD-10-CM

## 2019-12-22 DIAGNOSIS — M1990 Unspecified osteoarthritis, unspecified site: Secondary | ICD-10-CM | POA: Insufficient documentation

## 2019-12-22 DIAGNOSIS — Z9049 Acquired absence of other specified parts of digestive tract: Secondary | ICD-10-CM | POA: Diagnosis not present

## 2019-12-22 DIAGNOSIS — Z79899 Other long term (current) drug therapy: Secondary | ICD-10-CM | POA: Insufficient documentation

## 2019-12-22 DIAGNOSIS — Z833 Family history of diabetes mellitus: Secondary | ICD-10-CM | POA: Insufficient documentation

## 2019-12-22 DIAGNOSIS — Z8673 Personal history of transient ischemic attack (TIA), and cerebral infarction without residual deficits: Secondary | ICD-10-CM | POA: Diagnosis not present

## 2019-12-22 DIAGNOSIS — Z880 Allergy status to penicillin: Secondary | ICD-10-CM | POA: Diagnosis not present

## 2019-12-22 DIAGNOSIS — Z825 Family history of asthma and other chronic lower respiratory diseases: Secondary | ICD-10-CM | POA: Diagnosis not present

## 2019-12-22 DIAGNOSIS — I10 Essential (primary) hypertension: Secondary | ICD-10-CM | POA: Insufficient documentation

## 2019-12-22 DIAGNOSIS — Z981 Arthrodesis status: Secondary | ICD-10-CM | POA: Diagnosis not present

## 2019-12-22 DIAGNOSIS — Z820 Family history of epilepsy and other diseases of the nervous system: Secondary | ICD-10-CM | POA: Diagnosis not present

## 2019-12-22 DIAGNOSIS — Z823 Family history of stroke: Secondary | ICD-10-CM | POA: Diagnosis not present

## 2019-12-22 DIAGNOSIS — K228 Other specified diseases of esophagus: Secondary | ICD-10-CM | POA: Insufficient documentation

## 2019-12-22 DIAGNOSIS — K449 Diaphragmatic hernia without obstruction or gangrene: Secondary | ICD-10-CM | POA: Insufficient documentation

## 2019-12-22 DIAGNOSIS — Z885 Allergy status to narcotic agent status: Secondary | ICD-10-CM | POA: Insufficient documentation

## 2019-12-22 DIAGNOSIS — M349 Systemic sclerosis, unspecified: Secondary | ICD-10-CM | POA: Diagnosis not present

## 2019-12-22 DIAGNOSIS — Z888 Allergy status to other drugs, medicaments and biological substances status: Secondary | ICD-10-CM | POA: Insufficient documentation

## 2019-12-22 DIAGNOSIS — Z91041 Radiographic dye allergy status: Secondary | ICD-10-CM | POA: Insufficient documentation

## 2019-12-22 DIAGNOSIS — Z881 Allergy status to other antibiotic agents status: Secondary | ICD-10-CM | POA: Insufficient documentation

## 2019-12-22 HISTORY — DX: Transient cerebral ischemic attack, unspecified: G45.9

## 2019-12-22 HISTORY — DX: Other specified postprocedural states: Z98.890

## 2019-12-22 HISTORY — DX: Nausea with vomiting, unspecified: R11.2

## 2019-12-22 HISTORY — DX: Hypotension, unspecified: I95.9

## 2019-12-22 HISTORY — PX: ESOPHAGOGASTRODUODENOSCOPY (EGD) WITH PROPOFOL: SHX5813

## 2019-12-22 HISTORY — PX: BALLOON DILATION: SHX5330

## 2019-12-22 SURGERY — ESOPHAGOGASTRODUODENOSCOPY (EGD) WITH PROPOFOL
Anesthesia: Monitor Anesthesia Care

## 2019-12-22 MED ORDER — PROPOFOL 500 MG/50ML IV EMUL
INTRAVENOUS | Status: AC
  Filled 2019-12-22: qty 50

## 2019-12-22 MED ORDER — SCOPOLAMINE 1 MG/3DAYS TD PT72
MEDICATED_PATCH | TRANSDERMAL | Status: AC
  Filled 2019-12-22: qty 1

## 2019-12-22 MED ORDER — LACTATED RINGERS IV SOLN
INTRAVENOUS | Status: DC
  Administered 2019-12-22: 1000 mL via INTRAVENOUS

## 2019-12-22 MED ORDER — SODIUM CHLORIDE 0.9 % IV SOLN: INTRAVENOUS | Status: DC

## 2019-12-22 MED ORDER — PROPOFOL 500 MG/50ML IV EMUL
INTRAVENOUS | Status: DC | PRN
  Administered 2019-12-22: 115 ug/kg/min via INTRAVENOUS

## 2019-12-22 MED ORDER — PROPOFOL 10 MG/ML IV BOLUS
INTRAVENOUS | Status: DC | PRN
  Administered 2019-12-22: 10 mg via INTRAVENOUS
  Administered 2019-12-22 (×2): 20 mg via INTRAVENOUS

## 2019-12-22 MED ORDER — PROPOFOL 10 MG/ML IV BOLUS
INTRAVENOUS | Status: AC
  Filled 2019-12-22: qty 20

## 2019-12-22 MED ORDER — SCOPOLAMINE 1 MG/3DAYS TD PT72
MEDICATED_PATCH | TRANSDERMAL | Status: DC | PRN
  Administered 2019-12-22: 1 via TRANSDERMAL

## 2019-12-22 MED ORDER — DEXAMETHASONE SODIUM PHOSPHATE 4 MG/ML IJ SOLN
INTRAMUSCULAR | Status: DC | PRN
  Administered 2019-12-22: 4 mg via INTRAVENOUS

## 2019-12-22 SURGICAL SUPPLY — 15 items

## 2019-12-22 NOTE — Op Note (Signed)
[facility] Patient Name: Virginia Shaw Procedure Date: 12/22/2019 MRN: [id number] Attending MD: Lear Ng , MD Date of Birth: Sep 15, 1966 CSN: [id number] Age: 54 Admit Type: Outpatient Procedure:                Upper GI endoscopy Indications:              Dysphagia, Stricture of the esophagus Providers:                Lear Ng, MD, Kary Kos, RN, Cleda Daub, RN, Theodora Blow, Technician Referring MD:             Judithann Sauger MD, MD Medicines:                Propofol per Anesthesia, Monitored Anesthesia Care Complications:            No immediate complications. Estimated Blood Loss:     Estimated blood loss was minimal. Procedure:                Pre-Anesthesia Assessment:                           - Prior to the procedure, a History and Physical                            was performed, and patient medications and                            allergies were reviewed. The patient's tolerance of                            previous anesthesia was also reviewed. The risks                            and benefits of the procedure and the sedation                            options and risks were discussed with the patient.                            All questions were answered, and informed consent                            was obtained. Prior Anticoagulants: The patient has                            taken no previous anticoagulant or antiplatelet                            agents. ASA Grade Assessment: III - A patient with                            severe systemic disease. After reviewing the risks  and benefits, the patient was deemed in                            satisfactory condition to undergo the procedure.                           After obtaining informed consent, the endoscope was                            passed under direct vision. Throughout the   procedure, the patient's blood pressure, pulse, and                            oxygen saturations were monitored continuously. The                            [id number]) Olympus gastroscope was                            introduced through the mouth, and advanced to the                            second part of duodenum. The upper GI endoscopy was                            accomplished without difficulty. The patient                            tolerated the procedure fairly well. Scope In: Scope Out: Findings:      One benign-appearing, intrinsic moderate (circumferential scarring or       stenosis; an endoscope may pass) stenosis was found in the distal       esophagus. The stenosis was traversed. A TTS dilator was passed through       the scope. Dilation with a 15-16.5-18 mm balloon dilator was performed       to 16.5 mm. The dilation site was examined and showed complete       resolution of luminal narrowing. Estimated blood loss was minimal.      Segmental mild mucosal changes characterized by longitudinal markings       were found in the mid esophagus.      The Z-line was found 35 cm from the incisors.      A small hiatal hernia was present.      The exam of the stomach was otherwise normal.      Patchy mild mucosal changes characterized by erythema and erosion were       found in the duodenal bulb.      The exam of the duodenum was otherwise normal. Impression:               - Benign-appearing esophageal stenosis. Dilated.                           - Longitudinally marked mucosa in the esophagus.                           - Z-line, 35 cm  from the incisors.                           - Small hiatal hernia.                           - Mucosal changes in the duodenum.                           - No specimens collected. Moderate Sedation:      Not Applicable - Patient had care per Anesthesia. Recommendation:           - Patient has a contact number available for                             emergencies. The signs and symptoms of potential                            delayed complications were discussed with the                            patient. Return to normal activities tomorrow.                            Written discharge instructions were provided to the                            patient.                           - Advance diet as tolerated and clear liquid diet. Procedure Code(s):        --- Professional ---                           [phone], Esophagogastroduodenoscopy, flexible,                            transoral; with transendoscopic balloon dilation of                            esophagus (less than 30 mm diameter) Diagnosis Code(s):        --- Professional ---                           R13.10, Dysphagia, unspecified                           K22.2, Esophageal obstruction                           K22.8, Other specified diseases of esophagus                           K31.89, Other diseases of stomach and duodenum                           K44.9, Diaphragmatic hernia without obstruction  or                            gangrene CPT copyright 2019 [employer]. All rights reserved. The codes documented in this report are preliminary and upon coder review may  be revised to meet current compliance requirements. Lear Ng, MD 12/22/2019 11:43:20 AM This report has been signed electronically. Number of Addenda: 0

## 2019-12-22 NOTE — Interval H&P Note (Signed)
History and Physical Interval Note:  12/22/2019 11:00 AM  Virginia Shaw  has presented today for surgery, with the diagnosis of Esophageal Stricture K22.2.  The various methods of treatment have been discussed with the patient and family. After consideration of risks, benefits and other options for treatment, the patient has consented to  Procedure(s): ESOPHAGOGASTRODUODENOSCOPY (EGD) WITH PROPOFOL (N/A) SAVORY DILATION-vs balloon (N/A) as a surgical intervention.  The patient's history has been reviewed, patient examined, no change in status, stable for surgery.  I have reviewed the patient's chart and labs.  Questions were answered to the patient's satisfaction.     Lear Ng

## 2019-12-22 NOTE — H&P (Signed)
Date of Initial H&P: 12/15/19  History reviewed, patient examined, no change in status, stable for surgery.

## 2019-12-22 NOTE — Transfer of Care (Signed)
Immediate Anesthesia Transfer of Care Note  Patient: Virginia Shaw  Procedure(s) Performed: ESOPHAGOGASTRODUODENOSCOPY (EGD) WITH PROPOFOL (N/A ) BALLOON DILATION (N/A )  Patient Location: PACU  Anesthesia Type:MAC  Level of Consciousness: awake, alert  and oriented  Airway & Oxygen Therapy: Patient Spontanous Breathing and Patient connected to face mask oxygen  Post-op Assessment: Report given to RN, Post -op Vital signs reviewed and stable and Patient moving all extremities X 4  Post vital signs: Reviewed and stable  Last Vitals:  Vitals Value Taken Time  BP 85/55 12/22/19 1140  Temp    Pulse 76 12/22/19 1141  Resp 14 12/22/19 1141  SpO2 98 % 12/22/19 1141  Vitals shown include unvalidated device data.  Last Pain:  Vitals:   12/22/19 1016  TempSrc: Oral  PainSc: 0-No pain         Complications: No apparent anesthesia complications

## 2019-12-22 NOTE — Discharge Instructions (Signed)

## 2019-12-22 NOTE — Anesthesia Postprocedure Evaluation (Signed)
Anesthesia Post Note  Patient: Siyana Erney  Procedure(s) Performed: ESOPHAGOGASTRODUODENOSCOPY (EGD) WITH PROPOFOL (N/A ) BALLOON DILATION (N/A )     Patient location during evaluation: PACU Anesthesia Type: MAC Level of consciousness: awake and alert and oriented Pain management: pain level controlled Vital Signs Assessment: post-procedure vital signs reviewed and stable Respiratory status: spontaneous breathing, nonlabored ventilation and respiratory function stable Cardiovascular status: stable and blood pressure returned to baseline Postop Assessment: no apparent nausea or vomiting Anesthetic complications: no    Last Vitals:  Vitals:   12/22/19 1016 12/22/19 1140  BP: (!) 87/54 (!) 85/55  Pulse: 79 78  Resp: (!) 8 14  Temp: 37.1 C 36.6 C  SpO2: 98% 99%    Last Pain:  Vitals:   12/22/19 1140  TempSrc: Oral  PainSc: 0-No pain                 Avonna Iribe A.

## 2019-12-22 NOTE — Anesthesia Procedure Notes (Signed)
Procedure Name: MAC Date/Time: 12/22/2019 11:06 AM Performed by: Niel Hummer, CRNA Pre-anesthesia Checklist: Patient identified, Emergency Drugs available, Suction available and Patient being monitored Patient Re-evaluated:Patient Re-evaluated prior to induction Oxygen Delivery Method: Simple face mask

## 2019-12-22 NOTE — Anesthesia Preprocedure Evaluation (Addendum)
Anesthesia Evaluation  Patient identified by MRN, date of birth, ID band Patient awake    Reviewed: Allergy & Precautions, NPO status , Patient's Chart, lab work & pertinent test results, reviewed documented beta blocker date and time   History of Anesthesia Complications (+) PONV  Airway Mallampati: II  TM Distance: >3 FB Neck ROM: Full    Dental  (+) Chipped, Dental Advisory Given   Pulmonary neg pulmonary ROS,  12/18/2019 SARS coronavirus NEG   breath sounds clear to auscultation       Cardiovascular hypertension, Pt. on medications and Pt. on home beta blockers  Rhythm:Regular Rate:Normal  '12 Cath: no coronary spasm with Isuprel challenge '11 Cath: normal coronaries   Neuro/Psych Anxiety Somatization disorderSystemic sclerosis: spasm  TIA   GI/Hepatic Neg liver ROS, GERD  Medicated and Controlled,  Endo/Other  negative endocrine ROS  Renal/GU negative Renal ROS   H/o bladder spasm    Musculoskeletal  (+) Arthritis ,   Abdominal   Peds  Hematology negative hematology ROS (+)   Anesthesia Other Findings   Reproductive/Obstetrics S/p hysterectomy                            Anesthesia Physical Anesthesia Plan  ASA: III  Anesthesia Plan: MAC   Post-op Pain Management:    Induction:   PONV Risk Score and Plan: 3 and Treatment may vary due to age or medical condition and Ondansetron  Airway Management Planned: Nasal Cannula and Natural Airway  Additional Equipment:   Intra-op Plan:   Post-operative Plan:   Informed Consent: I have reviewed the patients History and Physical, chart, labs and discussed the procedure including the risks, benefits and alternatives for the proposed anesthesia with the patient or authorized representative who has indicated his/her understanding and acceptance.     Dental advisory given  Plan Discussed with: CRNA and Surgeon  Anesthesia Plan  Comments:        Anesthesia Quick Evaluation

## 2019-12-23 ENCOUNTER — Encounter: Payer: Self-pay | Admitting: *Deleted

## 2020-01-14 ENCOUNTER — Telehealth: Payer: Self-pay | Admitting: Specialist

## 2020-01-14 NOTE — Telephone Encounter (Signed)
If we are going to be doing surgery can you do a blue sheet for [place].

## 2020-01-14 NOTE — Telephone Encounter (Signed)
Patient called and stated she wanted to move forward with her surgery. He MD, Dr. Magda Paganini highly recommended her to move forward with it.  Please call patient @ [phone]

## 2020-01-29 ENCOUNTER — Other Ambulatory Visit: Payer: Self-pay | Admitting: Gastroenterology

## 2020-02-01 ENCOUNTER — Ambulatory Visit (INDEPENDENT_AMBULATORY_CARE_PROVIDER_SITE_OTHER): Payer: Medicaid Other | Admitting: Specialist

## 2020-02-01 ENCOUNTER — Other Ambulatory Visit: Payer: Self-pay

## 2020-02-01 ENCOUNTER — Encounter: Payer: Self-pay | Admitting: Specialist

## 2020-02-01 VITALS — BP 89/58 | HR 78 | Ht 59.75 in | Wt 133.0 lb

## 2020-02-01 DIAGNOSIS — N23 Unspecified renal colic: Secondary | ICD-10-CM

## 2020-02-01 DIAGNOSIS — M5440 Lumbago with sciatica, unspecified side: Secondary | ICD-10-CM

## 2020-02-01 DIAGNOSIS — M5136 Other intervertebral disc degeneration, lumbar region: Secondary | ICD-10-CM | POA: Diagnosis not present

## 2020-02-01 NOTE — Progress Notes (Signed)
Office Visit Note   Patient: Virginia Shaw           Date of Birth: 31-Oct-1966           MRN: [id number] Visit Date: 02/01/2020              Requested by: [name], [place],  [employer] [address] PCP: [name], MD   Assessment & Plan: Visit Diagnoses:  1. Degenerative disc disease, lumbar   2. Kidney pain   3. Acute bilateral low back pain with sciatica, sciatica laterality unspecified     Plan: Avoid bending, stooping and avoid lifting weights greater than 10 lbs. Avoid prolong standing and walking. Order for a new walker with wheels. Surgery scheduling secretary Kandice Hams, will call you in the next week to schedule for surgery.  Surgery recommended is a two level lumbar fusions left L4-5 and L5-S1 this would be done with rods, screws and cages with local bone graft and allograft (donor bone graft). Take hydrocodone for for pain. Risk of surgery includes risk of infection 1 in 200 patients, bleeding 1/2% chance you would need a transfusion.   Risk to the nerves is one in 10,000. You will need to use a brace for 3 months and wean from the brace on the 4th month. Expect improved walking and standing tolerance. Expect relief of leg pain but numbness may persist depending on the length and degree of pressure that has been present.  Avoid bending, stooping and avoid lifting weights greater than 10 lbs. Avoid prolong standing and walking. Order for a new walker with wheels. Surgery scheduling secretary Kandice Hams, will call you in the next week to schedule for surgery.  Surgery recommended is a two level lumbar fusion L5-S1 and S1-S2 this would be done with rods, screws and cages with local bone graft and allograft (donor bone graft). Take hydrocodone for for pain. Risk of surgery includes risk of infection 1 in 200 patients, bleeding 1/2% chance you would need a transfusion.   Risk to the nerves is one in 10,000. You will need  to use a brace for 3 months and wean from the brace on the 4th month. Expect improved walking and standing tolerance. Expect relief of leg pain but numbness may persist depending on the length and degree of pressure that has been present.    Follow-Up Instructions: No follow-ups on file.   Orders:  No orders of the defined types were placed in this encounter.  No orders of the defined types were placed in this encounter.     Procedures: No procedures performed   Clinical Data: Findings:  CLINICAL DATA:  Back pain radiating to the left groin.  EXAM: MRI LUMBAR SPINE WITHOUT CONTRAST  TECHNIQUE: Multiplanar, multisequence MR imaging of the lumbar spine was performed. No intravenous contrast was administered.  COMPARISON:  CT scan 08/07/2019, lumbar radiographs 06/01/2019, and lumbar MRI from 01/03/2017.  FINDINGS: Segmentation: The lowest lumbar type non-rib-bearing vertebra is labeled as L5.  Alignment:  3 mm degenerative retrolisthesis at L4-5.  Vertebrae: Disc desiccation at L4-5 and L5-S1 with loss of disc height at both levels.  Conus medullaris and cauda equina: Conus extends to the L1 level. Conus and cauda equina appear normal.  Paraspinal and other soft tissues: Left kidney lower pole perinephric hematoma is again observed with accentuated T1 signal and intermediate T2 signal. This does not appear larger than it did on 08/07/2019.  Disc levels:  T12-L1:  Unremarkable.  L1-2: Unremarkable.  L2-3: Unremarkable.  L3-4: Unremarkable.  L4-5: Borderline left foraminal stenosis due to facet arthropathy, disc bulge, small left lateral recess disc protrusion, and mild intervertebral spurring. The bulge abuts the left L5 nerve roots in the lateral recess does not appear to significantly compress the nerve roots. Left laminectomy noted.  L5-S1: Mild bilateral foraminal stenosis due to intervertebral and facet spurring along with mild disc  bulge. Findings at this level are stable.  IMPRESSION: 1. Lumbar spondylosis and degenerative disc disease leading to mild impingement in the neural foramina at L5-S1, and borderline impingement at L4-5, not appreciably changed from the 01/03/2017 exam. 2. Essentially stable appearance of the left kidney lower pole perinephric hematoma.   Electronically Signed   By: Van Clines M.D.   On: 08/11/2019 09:19 CLINICAL DATA:  Chronic left hip pain.  EXAM: MR OF THE LEFT HIP WITHOUT CONTRAST  TECHNIQUE: Multiplanar, multisequence MR imaging was performed. No intravenous contrast was administered.  COMPARISON:  None.  FINDINGS: Both hips are normally located. No stress fracture or AVN. No significant degenerative changes. The articular cartilage appears intact. No hip joint effusion or periarticular fluid collections to suggest a paralabral cyst.  No obvious labral tear. If symptoms persist or worsen MR arthrography may be helpful for further evaluation.  The pubic symphysis and SI joints are intact. No pelvic fractures or bone lesions.  The surrounding hip and pelvic musculature appear normal. No muscle tear or myositis. Minimal bilateral peritendinitis but no findings for trochanteric bursitis.  No significant intrapelvic abnormalities are identified.  IMPRESSION: 1. Unremarkable MR examination of the left hip. No significant degenerative changes, stress fracture, AVN or obvious labral tear. 2. Intact bony pelvis.   Electronically Signed   By: Marijo Sanes M.D.   On: 11/16/2019 11:01     Subjective: Chief Complaint  Patient presents with  . Lower Back - Follow-up, Pain    Wants to discuss surgery    54 year old female with history of back pain greater than leg pain with pain in the lower part of the lumbar spine and she experiences pain Into the left groin. The pain is worse with bending, stooping and lifting and with vacuuming  especially. She has some irritable bladder IBS. And she has had a kidney biopsy, for enlarged kidney with thinning of the membranes of the kidney, biopsy was done at [facility], Dr. Jaclyn Shaw, off [facility].  She is walking but after awhile it catches and sitting is bad and riding in a car and driving especially with hitting the brakes. It is really causing a lot of loss of quality of life. She has a history of angina and TIAs and takes NTG for Angina and isosorbide or Imdur, norvasc and metoprolol, her cardiologist is Dr. Audree Bane with [facility] at [facility]. She is dx as having CAD from multiple MDs, cardiac cath done in 2009-10. And heart was doing great it was the blood flow to the heart, no stents as they wanted to treat with meds to decrease her angina. There is numbness in the left leg with night pain, groin pain and left calf  Pain that is left anterior and lateral and posterior. The pain is dull at first then becomes an excrutiating sharper pain. The left leg does  Have weakness with the left leg loss of balance without fall. Stairs are only 3 going up to front doors sometime going one stair at a time leading with the  right leg. Has a fence next to the porch and she leans on the fence to negotiate the stairs. The last time in PT was one year Ago and it did not help. In the last 6 months the pain is not much better and she is having trouble doing the exercise, walking and core  Strengthening, mostly walking but this is being difficult due to pain. Riding in the car and driving is painful. Daughter married and other is at school and she has few options for assistance with driving. She drove today. Her arteries to the legs, the vein MD was going to bypass an Artery but insurance would not pay for this, Dr. Jones Skene. Has trouble reaching shoes and socks, wears slipons. The OB-GYN specialist believes much of her pain is musculoskeletal and lumbar spine. Lumbar pain is about  an "8" after sitting so long and driving 25 minutes from home. Unable to take NSAIDs and presently takes nothing, when pain is sharp it is a "10" and usually a "7 or 8". Leaning to the right decreases the pain. In the past she had a ESI  But had it exacerbate her angina and Dr. Maxie Better went on to perform surgery. She is on SSDI since September 2019.   Review of Systems  Constitutional: Positive for unexpected weight change (has gained about 10 lbs since one year ago). Negative for activity change, appetite change, chills, diaphoresis, fatigue and fever.  HENT: Positive for ear pain (left ear behind the ear), postnasal drip (not since surgery, sinuses are better surgery one year ago.), rhinorrhea, sinus pressure and sinus pain. Negative for congestion, dental problem, drooling, ear discharge, facial swelling, hearing loss, mouth sores, nosebleeds, sneezing, sore throat, tinnitus, trouble swallowing and voice change.   Eyes: Positive for pain (dry eye syndrome, itching and eye pain) and itching. Negative for photophobia, discharge, redness and visual disturbance.  Respiratory: Positive for shortness of breath (occasional SOB). Negative for apnea, cough, choking, chest tightness, wheezing and stridor.   Cardiovascular: Positive for chest pain (Had one episode 2 days ago lasted 3 minutes, took 2 baby aspirin, low BP), palpitations (at times, but not a whole lot since starting toprolol) and leg swelling (left leg is swelling).  Endocrine: Positive for cold intolerance and heat intolerance. Negative for polydipsia, polyphagia and polyuria.  Genitourinary: Positive for dysuria (pain with IBS), frequency, hematuria (trace of blood in urine for last 15 years) and pelvic pain. Negative for difficulty urinating, dyspareunia, enuresis and flank pain.  Musculoskeletal: Positive for back pain, gait problem and joint swelling (Left ankle with swelling at times and pain). Negative for arthralgias (left ankle, left groin),  myalgias, neck pain and neck stiffness.  Skin: Negative.  Negative for color change, pallor, rash and wound.     Objective: Vital Signs: BP (!) 89/58   Pulse 78   Ht 4' 11.75" (1.518 m)   Wt 133 lb (60.3 kg)   BMI 26.19 kg/m   Physical Exam Musculoskeletal:     Lumbar back: Positive left straight leg raise test.     Right Hip Exam   Other  Sensation: normal Pulse: present   Left Hip Exam   Tenderness  The patient is experiencing tenderness in the anterior and greater trochanter.  Range of Motion  External rotation:  40 abnormal  Internal rotation: 15 normal   Muscle Strength  Flexion: 4/5   Tests  FABER: positive Ober: positive  Other  Erythema: absent Scars: absent Sensation: normal Pulse: present  Comments:  Left hip is painful with flexion and flexion is limited compared with the right hip to about 100 degrees the right hip can flex about 130 degrees.    Back Exam   Tenderness  The patient is experiencing tenderness in the lumbar.  Range of Motion  Extension: abnormal  Flexion:  60 abnormal  Lateral bend right: abnormal  Lateral bend left: abnormal  Rotation right: abnormal  Rotation left: abnormal   Muscle Strength  Right Quadriceps:  5/5  Left Quadriceps:  5/5  Right Hamstrings:  5/5  Left Hamstrings:  5/5   Tests  Straight leg raise left: positive  Reflexes  Patellar: 2/4 Achilles: 2/4 Babinski's sign: normal   Other  Toe walk: normal Heel walk: normal Sensation: normal Gait: normal  Erythema: no back redness Scars: absent      Specialty Comments:  No specialty comments available.  Imaging: No results found.   PMFS History: Patient Active Problem List   Diagnosis Date Noted  . Dysphagia 12/22/2019  . DDD (degenerative disc disease), cervical 08/11/2018  . DDD (degenerative disc disease), lumbar 08/11/2018  . Somatic complaints, multiple 03/14/2017  . Chronic pain syndrome 08/25/2014  . Irritable bowel  syndrome (IBS) 01/12/2014  . 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 Chronic abdominal pain 01/11/2014  . Chronic venous insufficiency 10/30/2013  . Varicose veins 10/30/2013  . Chest tightness 03/25/2012  . 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 RECTAL BLEEDING 08/26/2008  . UTI'S, RECURRENT 08/26/2008   Past Medical History:  Diagnosis Date  . Abnormal involuntary movements(781.0)   . Allergic rhinitis, cause unspecified   . Anxiety state, unspecified   . Cervical spondylosis without myelopathy   . Disturbance of skin sensation   . Disturbance of skin sensation   . GERD (gastroesophageal reflux disease)   . History of chicken pox   . Irritable bowel syndrome   . Low blood pressure   . Lumbago   . Myalgia and myositis, unspecified   . Osteoporosis   . Other and unspecified angina pectoris   . Other malaise and fatigue   . PONV (postoperative nausea and vomiting)   . 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 Somatization disorder   . Spasm of muscle   . Systemic sclerosis ([place])   . TIA (transient ischemic attack)   . Unspecified hereditary and idiopathic peripheral neuropathy     Family History  Problem Relation Age of Onset  . Hypertension Father   . Hyperlipidemia Father   . Diabetes Father   . Stroke Father   . Cancer Maternal Uncle        lung  . Cancer Paternal Aunt        lung  . Migraines Mother   . Heart attack Paternal Grandmother   . Heart attack Paternal Grandfather   . Hypertension Paternal Grandfather   . Migraines Sister     Past Surgical History:  Procedure Laterality Date  . ABDOMINAL HYSTERECTOMY  1997  . arnold chiari malformation  2013  . BACK SURGERY  2001, 2011,2013   Lumbar x 2  . BALLOON DILATION N/A 12/22/2019   Procedure: BALLOON DILATION;  Surgeon: Wilford Corner, MD;  Location: [facility];  Service: Endoscopy;  Laterality: N/A;  . barium swallow test  10/2019  . BLADDER SURGERY  2011  . CARDIAC CATHETERIZATION    . CATARACT  EXTRACTION    . CERVICAL SPINE SURGERY  2013  . CHOLECYSTECTOMY    . COLONOSCOPY  2009   "Normal" Dr Sharlett Iles  .  COLONOSCOPY  2002   "Normal" Dr Freddrick March  . COLONOSCOPY  ~2013   Dr Ferdinand Lango.  "Chest pain with the prep" = aborted  . CRANIECTOMY SUBOCCIPITAL W/ CERVICAL LAMINECTOMY / CHIARI  2012   Dr Annette Stable, Chiari decompression with suboccipital craniectomy and  . CYSTOCELE REPAIR  2002   Dr Gaetano Net  . ESOPHAGOGASTRODUODENOSCOPY (EGD) WITH PROPOFOL N/A 12/22/2019   Procedure: ESOPHAGOGASTRODUODENOSCOPY (EGD) WITH PROPOFOL;  Surgeon: Wilford Corner, MD;  Location: [facility];  Service: Endoscopy;  Laterality: N/A;  . [place], 2000  . PITUITARY EXCISION  2010   pituitary cyst removed  . RECTOCELE REPAIR  2002   Dr. Gaetano Net  . RENAL BIOPSY  08/04/2019  . tear duct tubes  2005  . TUBAL LIGATION     Social History   Occupational History    Employer: UNEMPLOYED  Tobacco Use  . Smoking status: Never Smoker  . Smokeless tobacco: Never Used  Substance and Sexual Activity  . Alcohol use: No  . Drug use: No  . Sexual activity: Never    Birth control/protection: Abstinence

## 2020-02-01 NOTE — Patient Instructions (Signed)
Plan: Avoid bending, stooping and avoid lifting weights greater than 10 lbs. Avoid prolong standing and walking. Order for a new walker with wheels. Surgery scheduling secretary Kandice Hams, will call you in the next week to schedule for surgery.  Surgery recommended is a two level lumbar fusions left L4-5 and L5-S1 this would be done with rods, screws and cages with local bone graft and allograft (donor bone graft). Take hydrocodone for for pain. Risk of surgery includes risk of infection 1 in 200 patients, bleeding 1/2% chance you would need a transfusion.   Risk to the nerves is one in 10,000. You will need to use a brace for 3 months and wean from the brace on the 4th month. Expect improved walking and standing tolerance. Expect relief of leg pain but numbness may persist depending on the length and degree of pressure that has been present.  Avoid bending, stooping and avoid lifting weights greater than 10 lbs. Avoid prolong standing and walking. Order for a new walker with wheels. Surgery scheduling secretary Kandice Hams, will call you in the next week to schedule for surgery.  Surgery recommended is a two level lumbar fusion L5-S1 and S1-S2 this would be done with rods, screws and cages with local bone graft and allograft (donor bone graft). Take hydrocodone for for pain. Risk of surgery includes risk of infection 1 in 200 patients, bleeding 1/2% chance you would need a transfusion.   Risk to the nerves is one in 10,000. You will need to use a brace for 3 months and wean from the brace on the 4th month. Expect improved walking and standing tolerance. Expect relief of leg pain but numbness may persist depending on the length and degree of pressure that has been present.

## 2020-02-09 ENCOUNTER — Telehealth: Payer: Self-pay | Admitting: Specialist

## 2020-02-09 NOTE — Telephone Encounter (Signed)
I called and she had questions about if she needs to go see her Drs for preop clearance--I advised that our surg sched. Will fax a request to each dr for clearance then it would be up to the providers to decide if they need to see her and would probably depend on how long it has been since she was last seen by them.  She is also asking for an upright walker in stead of regular walker with wheels for her to use after surgery due to so hand issues she has , I advised she should mention to Jeneen Rinks when she has her pre-op appt with him as he usually puts in the orders. She states that she understands.

## 2020-02-09 NOTE — Telephone Encounter (Signed)
Patient called requesting call back. Patient has questions about upcoming surgery and would like a call back from Dr. Otho Ket nurse. Patient phone number is [phone].

## 2020-02-11 ENCOUNTER — Ambulatory Visit: Payer: Medicaid Other | Admitting: Physician Assistant

## 2020-02-15 ENCOUNTER — Telehealth: Payer: Self-pay

## 2020-02-15 NOTE — Telephone Encounter (Signed)
Patient concerned about surgery and getting donor bone graft.  Wants to know if there is any other way to have the surgery.

## 2020-02-15 NOTE — Telephone Encounter (Signed)
The risk of donor bone graft causing infection or complication is minimal compared with the risk of pain at the Area where bone is harvested in patients using their on iliac crest bone. The graft we use is cultured and patients  Donating the graft are selected based on their medical history. Allograft graft is freeze dried and has no living tissue  Attached to it whereas vivigen is living bone cells that provide real living bone making cell. Allograft has no viable or living tissue and looks like plastic or croutons, it is sterilized with freeze drying. Vivigen is frozen to -40 degrees And has some risk of infection, not greater than normally quoted for the surgery scheduled. 1 in 100 for patients with Diabetes and 1 in 300 for patients not having diabetes. Some patients have a religious cause for not taking bone graft From a donor as in Jehovah witness.

## 2020-02-17 ENCOUNTER — Ambulatory Visit: Payer: Medicaid Other | Admitting: Specialist

## 2020-02-29 ENCOUNTER — Ambulatory Visit (INDEPENDENT_AMBULATORY_CARE_PROVIDER_SITE_OTHER): Payer: Medicaid Other | Admitting: Specialist

## 2020-02-29 ENCOUNTER — Encounter: Payer: Self-pay | Admitting: Specialist

## 2020-02-29 ENCOUNTER — Other Ambulatory Visit: Payer: Self-pay

## 2020-02-29 VITALS — BP 99/68 | HR 86 | Ht 59.75 in | Wt 133.0 lb

## 2020-02-29 DIAGNOSIS — M5136 Other intervertebral disc degeneration, lumbar region: Secondary | ICD-10-CM | POA: Diagnosis not present

## 2020-02-29 DIAGNOSIS — M5440 Lumbago with sciatica, unspecified side: Secondary | ICD-10-CM | POA: Diagnosis not present

## 2020-02-29 NOTE — Patient Instructions (Signed)
Avoid bending, stooping and avoid lifting weights greater than 10 lbs. Avoid prolong standing and walking. Order for a new walker with wheels. Surgery scheduling secretary Kandice Hams, will call you in the next week to schedule for surgery.  Surgery recommended is a two level lumbar fusion L4-5 and L5-S1 this would be done with rods, screws and cages with local bone graft and autogenous( your iliac crest bone graft). Take hydrocodone for for pain. Risk of surgery includes risk of infection 1 in 300 patients, bleeding 1/2% chance you would need a transfusion.   Risk to the nerves is one in 10,000. You will need to use a brace for 3 months and wean from the brace on the 4th month. Expect improved walking and standing tolerance. Expect relief of leg pain but numbnessmay persist depending on the length and degree of pressure that has been present . 60-70% improvement in back pain in patients with mechanical degenerative disc disease.  may persist depending on the length and degree of pressure that has been present.

## 2020-02-29 NOTE — Progress Notes (Signed)
Office Visit Note   Patient: Virginia Shaw           Date of Birth: [birth year]           MRN: [id number] Visit Date: 02/29/2020              Requested by: [name], [place],  [place] [address] PCP: [name], MD   Assessment & Plan: Visit Diagnoses:  1. Degenerative disc disease, lumbar   2. Acute bilateral low back pain with sciatica, sciatica laterality unspecified     Plan: Avoid bending, stooping and avoid lifting weights greater than 10 lbs. Avoid prolong standing and walking. Order for a new walker with wheels. Surgery scheduling secretary Kandice Hams, will call you in the next week to schedule for surgery.  Surgery recommended is a two level lumbar fusion L4-5 and L5-S1 this would be done with rods, screws and cages with local bone graft and autogenous( your iliac crest bone graft). Take hydrocodone for for pain. Risk of surgery includes risk of infection 1 in 300 patients, bleeding 1/2% chance you would need a transfusion.   Risk to the nerves is one in 10,000. You will need to use a brace for 3 months and wean from the brace on the 4th month. Expect improved walking and standing tolerance. Expect relief of leg pain but numbnessmay persist depending on the length and degree of pressure that has been present . 60-70% improvement in back pain in patients with mechanical degenerative disc disease.  may persist depending on the length and degree of pressure that has been present.  Follow-Up Instructions: No follow-ups on file.   Orders:  No orders of the defined types were placed in this encounter.  No orders of the defined types were placed in this encounter.     Procedures: No procedures performed   Clinical Data: No additional findings.   Subjective: Chief Complaint  Patient presents with  . Lower Back - Follow-up    54 year old female with history of low back pain and buttock and thigh pain. She has had  years of back pain and pain with bending stooping and sitting. Her pain is mechanical and severe. She has completed numerous PT programs and tried meds without help, she is allergic to most NSAIDs and adverse sideeffects with most narcotics. She wishes to proceed with intervention with TLIFs at the L4-5 and L5-S1. 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 She has had bladder surgery in the past.    Review of Systems   Objective: Vital Signs: BP 99/68 (BP Location: Left Arm, Patient Position: Sitting)   Pulse 86   Ht 4' 11.75" (1.518 m)   Wt 133 lb (60.3 kg)   BMI 26.19 kg/m   Physical Exam Musculoskeletal:     Lumbar back: Negative right straight leg raise test and negative left straight leg raise test.     Back Exam   Tenderness  The patient is experiencing tenderness in the lumbar.  Range of Motion  Extension: abnormal  Flexion: abnormal  Lateral bend right: normal  Lateral bend left: normal  Rotation right: normal  Rotation left: normal   Muscle Strength  Right Quadriceps:  5/5  Left Quadriceps:  5/5  Right Hamstrings:  5/5  Left Hamstrings:  5/5   Tests  Straight leg raise right: negative Straight leg raise left: negative  Other  Toe walk: normal Heel walk: normal Sensation: normal Gait: normal  Erythema: no back redness Scars: absent      Specialty Comments:  No specialty comments available.  Imaging: No results found.   PMFS History: Patient Active Problem List   Diagnosis Date Noted  . Dysphagia 12/22/2019  . DDD (degenerative disc disease), cervical 08/11/2018  . DDD (degenerative disc disease), lumbar 08/11/2018  . Somatic complaints, multiple 03/14/2017  . Chronic pain syndrome 08/25/2014  . Irritable bowel syndrome (IBS) 01/12/2014  . 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 Chronic abdominal pain 01/11/2014  . Chronic venous insufficiency 10/30/2013  . Varicose veins 10/30/2013  . Chest tightness 03/25/2012  . 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 RECTAL BLEEDING 08/26/2008  . UTI'S, RECURRENT 08/26/2008   Past Medical History:  Diagnosis Date  . Abnormal involuntary movements(781.0)   . Allergic rhinitis, cause unspecified   . Anxiety state, unspecified   . Cervical spondylosis without myelopathy   . Disturbance of skin sensation   . Disturbance of skin sensation   . GERD (gastroesophageal reflux disease)   . History of chicken pox   . Irritable bowel syndrome   . Low blood pressure   . Lumbago   . Myalgia and myositis, unspecified   . Osteoporosis   . Other and unspecified angina pectoris   . Other malaise and fatigue   . PONV (postoperative nausea and vomiting)   . 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 Somatization disorder   . Spasm of muscle   . Systemic sclerosis ([place])   . TIA (transient ischemic attack)   . Unspecified hereditary and idiopathic peripheral neuropathy     Family History  Problem Relation Age of Onset  . Hypertension Father   . Hyperlipidemia Father   . Diabetes Father   . Stroke Father   . Cancer Maternal Uncle        lung  . Cancer Paternal Aunt        lung  . Migraines Mother   . Heart attack Paternal Grandmother   . Heart attack Paternal Grandfather   . Hypertension Paternal Grandfather   . Migraines Sister     Past Surgical History:  Procedure Laterality Date  . ABDOMINAL HYSTERECTOMY  1997  . arnold chiari malformation  2013  . BACK SURGERY  2001, 2011,2013   Lumbar x 2  . BALLOON DILATION N/A 12/22/2019   Procedure: BALLOON DILATION;  Surgeon: Wilford Corner, MD;  Location: [facility];  Service: Endoscopy;  Laterality: N/A;  . barium swallow test  10/2019  . BLADDER SURGERY  2011  . CARDIAC CATHETERIZATION    . CATARACT EXTRACTION    . CERVICAL SPINE SURGERY  2013  . CHOLECYSTECTOMY    . COLONOSCOPY  2009   "Normal" Dr Sharlett Iles  . COLONOSCOPY  2002   "Normal" Dr Freddrick March  . COLONOSCOPY  ~2013   Dr Ferdinand Lango.  "Chest pain with the prep"  = aborted  . CRANIECTOMY SUBOCCIPITAL W/ CERVICAL LAMINECTOMY / CHIARI  2012   Dr Annette Stable, Chiari decompression with suboccipital craniectomy and  . CYSTOCELE REPAIR  2002   Dr Gaetano Net  . ESOPHAGOGASTRODUODENOSCOPY (EGD) WITH PROPOFOL N/A 12/22/2019   Procedure: ESOPHAGOGASTRODUODENOSCOPY (EGD) WITH PROPOFOL;  Surgeon: Wilford Corner, MD;  Location: [facility];  Service: Endoscopy;  Laterality: N/A;  . [place], 2000  . PITUITARY EXCISION  2010   pituitary cyst removed  . RECTOCELE REPAIR  2002   Dr. Gaetano Net  . RENAL BIOPSY  08/04/2019  . tear duct tubes  2005  . TUBAL LIGATION  Social History   Occupational History    Employer: UNEMPLOYED  Tobacco Use  . Smoking status: Never Smoker  . Smokeless tobacco: Never Used  Substance and Sexual Activity  . Alcohol use: No  . Drug use: No  . Sexual activity: Never    Birth control/protection: Abstinence

## 2020-03-07 ENCOUNTER — Other Ambulatory Visit: Payer: Self-pay

## 2020-03-07 ENCOUNTER — Encounter: Payer: Self-pay | Admitting: Pulmonary Disease

## 2020-03-07 ENCOUNTER — Ambulatory Visit: Payer: Medicaid Other | Admitting: Pulmonary Disease

## 2020-03-07 VITALS — BP 128/74 | HR 81 | Temp 98.4°F | Ht 59.0 in | Wt 141.6 lb

## 2020-03-07 DIAGNOSIS — R042 Hemoptysis: Secondary | ICD-10-CM | POA: Diagnosis not present

## 2020-03-07 NOTE — Progress Notes (Signed)
Virginia Shaw    [id number]    July 04, 1966  Primary Care Physician:Smith, Hal Hope, MD  Referring Physician: [name], [place],  [place] [address]  Chief complaint: Follow up for blood in sputum  HPI: 54 year old with history of angina, osteoarthritis, endometriosis, IBS, chronic neck and back pain sent here for evaluation of hemoptysis.  She has had intermittent streaks of blood in the sputum for the past year.  She had an episode of blood with clots in the sputum around May of this year.  Also noted to have proteinuria and hematuria.  Follows with [facility]rology at [facility].  She has been evaluated by Dr. Harrie Jeans, nephrology.  Work-up as noted below with elevated anti-proteinase 3 antibody.    She has not had any more episodes of blood in sputum since May.  Blood is not associated with cough.  Denies any dyspnea, sputum production, fevers, chills.  History noted for sinusitis.  She has had nasal surgery in the past.  Also has intermittent episodes of GERD.  She has chronic joint pain which is thought to be secondary to osteoarthritis and degenerative disc disease.  Follows with Dr. Estanislado Pandy.  She does not take NSAIDs due to side effects.  Pets: Has a dog.  No cats, birds, farm animals Occupation: Used to work at a [employer] until 2007.  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  No current ongoing exposures.  Denies any mold, hot tub, Jacuzzi, humidifier Smoking history: Never smoker Travel history: No significant travel history Relevant family history: There is significant family history of lung disease.  Her aunt had a lung transplant.  She is not clear why the transplant was needed.. Several family members have lung cancer, mesothelioma, COPD.  Interim history: States that hemoptysis is improved.  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  She is not on any particular treatment from her nephrologist and is being monitored.  Has a colonoscopy and knee surgery planned for later in May.  Outpatient Encounter Medications as of 03/07/2020  Medication Sig  . amLODipine (NORVASC) 5 MG tablet Take 2.5-5 mg by mouth See admin instructions. Take 1 tablet (5 mg) by mouth in the morning & take 0.5 tablet (2.5 mg) by mouth in the evening if needed for angina  . aspirin EC 81 MG tablet Take 81 mg by mouth daily as needed (chest tightness/pain.).  Marland Kitchen isosorbide mononitrate (IMDUR) 30 MG 24 hr tablet Take 30 mg by mouth daily.  . metoprolol succinate (TOPROL-XL) 25 MG 24 hr tablet Take 12.5 mg by mouth daily.  . nitroGLYCERIN (NITROSTAT) 0.4 MG SL tablet Place 0.4 mg under the tongue every 5 (five) minutes x 3 doses as needed for chest pain.   Marland Kitchen ondansetron (ZOFRAN) 4 MG tablet Take 4 mg by mouth daily.  Vladimir Faster Glycol-Propyl Glycol (SYSTANE) 0.4-0.3 % SOLN Place 1 drop into both eyes 3 (three) times daily as needed (dry eyes).   No facility-administered encounter medications on file as of 03/07/2020.   Physical Exam: Blood pressure 128/74, pulse 81, temperature 98.4 F (36.9 C), temperature source Temporal, height 4\' 11"  (1.499 m), weight 141 lb 9.6 oz (64.2 kg), SpO2 97 %. Gen:      No acute distress  HEENT:  EOMI, sclera anicteric Neck:     No masses; no thyromegaly Lungs:    Clear to auscultation bilaterally; normal respiratory effort CV:         Regular rate and rhythm; no murmurs Abd:      + bowel sounds; soft, non-tender; no palpable masses, no distension Ext:    No edema; adequate peripheral perfusion Skin:      Warm and dry; no rash Neuro: alert and oriented x 3 Psych: normal mood and affect  Data Reviewed: Imaging: Chest x-ray 07/24/2018- no active cardiopulmonary disease. CT abdomen pelvis 03/01/2019- no acute abnormality  in lower chest. High-resolution CT 07/08/2019- no interstitial lung disease.  Minimal air trapping.  Aortic atherosclerosis.   I have reviewed images personally  Labs: From nephrology office 05/05/2019- Anti-proteinase 3 antibody-8.8 [0-3.5) MPO ab, c-ANCA, p-ANCA-negative Anti-GBM antibody-6 units C3-C4 complement-within normal limits ANA- negative  Metabolic panel [id number] 17, creatinine 0.85 CBC--WBC 7.9, hemoglobin 14.3, platelets 379, eos 15%, absolute eosinophil count 1185 UA 05/05/2019-1+ occult blood, negative protein  Pathology: Kidney biopsy 08/04/2019-glomerulopathy with segmental GBM thinning.  Assessment:  Evaluation for blood in sputum Unclear if this is actually hemoptysis.  She has been worked up for hematuria with anti-PR-3 antibody. Kidney biopsy with no evidence of vasculitis.  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 There is also question of GERD with esophageal stricture as she is seeing a gastroenterologist for dilatation.  Overall her hemoptysis has improved significantly. We will repeat a CT chest again and will likely need a bronchoscope as she has intermittent symptoms She is scheduled for colonoscopy and knee surgery in the near future.  Will reassess after these procedures and after the CT scan is done.  Discussed in detail with patient today.  Plan/Recommendations: - CT chest without contrast Return to clinic after scan to discuss bronchoscopy.  Marshell Garfinkel MD [facility] 03/07/2020, 9:29 AM  CC: [name], MD

## 2020-03-07 NOTE — Patient Instructions (Signed)
We will get CT scan without contrast in the next 1 to 2 months Follow-up in clinic after CT scan to review and discuss possible bronchoscopy.

## 2020-03-07 NOTE — Progress Notes (Signed)
Virginia Shaw    [id number]    [birth year]  Primary Care Physician:Shaw, Virginia Hope, MD  Referring Physician: Carol Shaw, Virginia Shaw,  [place] [address]  Chief complaint: Follow up for blood in sputum  HPI: 54 year old with history of angina, osteoarthritis, endometriosis, IBS, chronic neck and back pain sent here for evaluation of hemoptysis.  She has had intermittent streaks of blood in the sputum for the past year.  She had an episode of blood with clots in the sputum around May of this year.  Also noted to have proteinuria and hematuria.  Follows with urology at [facility]  She has been evaluated by Dr. Harrie Shaw, nephrology.  Work-up as noted below with elevated anti-proteinase 3 antibody.    She has not had any more episodes of blood in sputum since May.  Blood is not associated with cough.  Denies any dyspnea, sputum production, fevers, chills.  History noted for sinusitis.  She has had nasal surgery in the past.  Also has intermittent episodes of GERD.  She has chronic joint pain which is thought to be secondary to osteoarthritis and degenerative disc disease.  Follows with Dr. Estanislado Shaw.  She does not take NSAIDs due to side effects.  Pets: Has a dog.  No cats, birds, farm animals Occupation: Used to work at a [employer] until 2007.  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  No current ongoing exposures.  Denies any mold, hot tub, Jacuzzi, humidifier Smoking history: Never smoker Travel history: No significant travel history Relevant family history: There is significant family history of lung disease.  Her aunt had a lung transplant.  She is not clear why the transplant was needed.. Several family members have lung cancer, mesothelioma, COPD.  Interim history: Here for follow-up of CT.  States that she has not had any more episodes of blood in sputum  Has seen Dr. Royce Shaw, nephrology today and Madysyn tells me that kidney biopsy is not being planned as her renal function stable.  Outpatient Encounter Medications as of 03/07/2020  Medication Sig  . amLODipine (NORVASC) 5 MG tablet Take 2.5-5 mg by mouth See admin instructions. Take 1 tablet (5 mg) by mouth in the morning & take 0.5 tablet (2.5 mg) by mouth in the evening if needed for angina  . aspirin EC 81 MG tablet Take 81 mg by mouth daily as needed (chest tightness/pain.).  Marland Kitchen isosorbide mononitrate (IMDUR) 30 MG 24 hr tablet Take 30 mg by mouth daily.  . metoprolol succinate (TOPROL-XL) 25 MG 24 hr tablet Take 12.5 mg by mouth daily.  . nitroGLYCERIN (NITROSTAT) 0.4 MG SL tablet Place 0.4 mg under the tongue every 5 (five) minutes x 3 doses as needed for chest pain.   Marland Kitchen ondansetron (ZOFRAN) 4 MG tablet Take 4 mg by mouth daily.  Vladimir Faster Glycol-Propyl Glycol (SYSTANE) 0.4-0.3 % SOLN Place 1 drop into both eyes 3 (three) times daily as needed (dry eyes).   No facility-administered encounter medications on file as of 03/07/2020.   Physical Exam: Blood pressure 108/60, pulse 66, temperature 98.3 F (36.8 C), height 4\' 11"  (1.499 m), weight 133 lb 12.8 oz (60.7 kg), SpO2 99 %. Gen:      No acute distress HEENT:  EOMI, sclera anicteric Neck:     No masses; no thyromegaly Lungs:    Clear to  auscultation bilaterally; normal respiratory effort CV:         Regular rate and rhythm; no murmurs Abd:      + bowel sounds; soft, non-tender; no palpable masses, no distension Ext:    No edema; adequate peripheral perfusion Skin:      Warm and dry; no rash Neuro: alert and oriented x 3 Psych: normal mood and affect  Data Reviewed: Imaging: Chest x-ray 07/24/2018- no active cardiopulmonary disease. CT abdomen pelvis 03/01/2019- no acute abnormality in lower chest. High-resolution CT 07/08/2019- no interstitial lung disease.  Minimal air trapping.  Aortic atherosclerosis.   I have reviewed images  personally  Labs: From nephrology office 05/05/2019- Anti-proteinase 3 antibody-8.8 [0-3.5) MPO ab, c-ANCA, p-ANCA-negative Anti-GBM antibody-6 units C3-C4 complement-within normal limits ANA- negative  Metabolic panel [id number] BUN 17, creatinine 0.85 CBC--WBC 7.9, hemoglobin 14.3, platelets 379, eos 15%, absolute eosinophil count 1185 UA 05/05/2019-1+ occult blood, negative protein  Assessment:  Evaluation for blood in sputum Unclear if this is actually hemoptysis.  She has been worked up for hematuria with anti-PR-3 antibody. Though there is concern for a vasculitis process her CT chest is normal with no evidence of interstitial lung disease. At this point there is no indication for bronchoscopy or lung biopsy.  If symptoms recur then we can reassess.  Discussed in detail with patient today.  Plan/Recommendations: - Continue monitoring symptoms Follow-up in 6 months  Virginia Garfinkel MD [facility] 03/07/2020, 9:35 AM  CC: Virginia Ada, MD

## 2020-03-16 ENCOUNTER — Telehealth: Payer: Self-pay

## 2020-03-16 NOTE — Telephone Encounter (Signed)
FYI - patient has decided to go for 2nd opinion before proceeding with surgery.

## 2020-03-22 ENCOUNTER — Other Ambulatory Visit: Payer: Self-pay | Admitting: Gastroenterology

## 2020-03-22 NOTE — Telephone Encounter (Signed)
Second opinions are always welcomed and represent an important step in patient's being comfortable with a surgical  solution. jen

## 2020-03-25 ENCOUNTER — Other Ambulatory Visit (HOSPITAL_COMMUNITY)
Admission: RE | Admit: 2020-03-25 | Discharge: 2020-03-25 | Disposition: A | Discharge status: Home / Self Care | Payer: Medicaid Other | Source: Ambulatory Visit | Attending: Gastroenterology | Admitting: Gastroenterology

## 2020-03-25 DIAGNOSIS — Z01812 Encounter for preprocedural laboratory examination: Secondary | ICD-10-CM | POA: Insufficient documentation

## 2020-03-25 DIAGNOSIS — Z20822 Contact with and (suspected) exposure to covid-19: Secondary | ICD-10-CM | POA: Insufficient documentation

## 2020-03-25 LAB — SARS CORONAVIRUS 2 (TAT 6-24 HRS): SARS Coronavirus 2: NEGATIVE

## 2020-03-29 ENCOUNTER — Ambulatory Visit (HOSPITAL_COMMUNITY)
Admission: RE | Admit: 2020-03-29 | Discharge: 2020-03-29 | Disposition: A | Discharge status: Home / Self Care | Payer: Medicaid Other | Attending: Gastroenterology | Admitting: Gastroenterology

## 2020-03-29 ENCOUNTER — Encounter (HOSPITAL_COMMUNITY): Payer: Self-pay | Admitting: Gastroenterology

## 2020-03-29 ENCOUNTER — Other Ambulatory Visit: Payer: Self-pay

## 2020-03-29 ENCOUNTER — Ambulatory Visit (HOSPITAL_COMMUNITY): Payer: Medicaid Other | Admitting: Anesthesiology

## 2020-03-29 ENCOUNTER — Encounter (HOSPITAL_COMMUNITY)
Admission: RE | Disposition: A | Discharge status: Home / Self Care | Payer: Self-pay | Source: Home / Self Care | Attending: Gastroenterology

## 2020-03-29 DIAGNOSIS — I10 Essential (primary) hypertension: Secondary | ICD-10-CM | POA: Diagnosis not present

## 2020-03-29 DIAGNOSIS — Z1211 Encounter for screening for malignant neoplasm of colon: Secondary | ICD-10-CM

## 2020-03-29 DIAGNOSIS — F419 Anxiety disorder, unspecified: Secondary | ICD-10-CM | POA: Insufficient documentation

## 2020-03-29 DIAGNOSIS — K640 First degree hemorrhoids: Secondary | ICD-10-CM | POA: Diagnosis not present

## 2020-03-29 DIAGNOSIS — K219 Gastro-esophageal reflux disease without esophagitis: Secondary | ICD-10-CM | POA: Insufficient documentation

## 2020-03-29 DIAGNOSIS — M1990 Unspecified osteoarthritis, unspecified site: Secondary | ICD-10-CM | POA: Insufficient documentation

## 2020-03-29 DIAGNOSIS — Z8673 Personal history of transient ischemic attack (TIA), and cerebral infarction without residual deficits: Secondary | ICD-10-CM | POA: Diagnosis not present

## 2020-03-29 HISTORY — PX: COLONOSCOPY WITH PROPOFOL: SHX5780

## 2020-03-29 SURGERY — COLONOSCOPY WITH PROPOFOL
Anesthesia: Monitor Anesthesia Care

## 2020-03-29 MED ORDER — PROPOFOL 500 MG/50ML IV EMUL
INTRAVENOUS | Status: AC
  Filled 2020-03-29: qty 50

## 2020-03-29 MED ORDER — PROPOFOL 500 MG/50ML IV EMUL
INTRAVENOUS | Status: DC | PRN
  Administered 2020-03-29: 125 ug/kg/min via INTRAVENOUS

## 2020-03-29 MED ORDER — PROPOFOL 10 MG/ML IV BOLUS
INTRAVENOUS | Status: DC | PRN
  Administered 2020-03-29: 20 mg via INTRAVENOUS

## 2020-03-29 MED ORDER — LIDOCAINE 2% (20 MG/ML) 5 ML SYRINGE
INTRAMUSCULAR | Status: DC | PRN
  Administered 2020-03-29: 60 mg via INTRAVENOUS

## 2020-03-29 MED ORDER — SODIUM CHLORIDE 0.9 % IV SOLN: INTRAVENOUS | Status: DC

## 2020-03-29 MED ORDER — LACTATED RINGERS IV SOLN
INTRAVENOUS | Status: DC
  Administered 2020-03-29: 1000 mL via INTRAVENOUS

## 2020-03-29 MED ORDER — PROMETHAZINE HCL 25 MG/ML IJ SOLN
INTRAMUSCULAR | Status: DC | PRN
  Administered 2020-03-29: 12.5 mg via INTRAVENOUS

## 2020-03-29 MED ORDER — PROMETHAZINE HCL 25 MG/ML IJ SOLN
INTRAMUSCULAR | Status: AC
  Filled 2020-03-29: qty 1

## 2020-03-29 SURGICAL SUPPLY — 22 items

## 2020-03-29 NOTE — Op Note (Signed)
[facility] Patient Name: Virginia Shaw Procedure Date: 03/29/2020 MRN: [id number] Attending MD: Lear Ng , MD Date of Birth: June 01, 1966 CSN: [id number] Age: 54 Admit Type: Outpatient Procedure:                Colonoscopy Indications:              Screening for colorectal malignant neoplasm, Last                            colonoscopy: January 2012 Providers:                Lear Ng, MD, Josie Dixon, RN, Wynonia Sours, RN, Cletis Athens, Technician, [address].                            Zenia Resides CRNA, CRNA Referring MD:             Judithann Sauger MD, MD Medicines:                Propofol per Anesthesia, Monitored Anesthesia Care Complications:            No immediate complications. Estimated Blood Loss:     Estimated blood loss: none. Procedure:                Pre-Anesthesia Assessment:                           - Prior to the procedure, a History and Physical                            was performed, and patient medications and                            allergies were reviewed. The patient's tolerance of                            previous anesthesia was also reviewed. The risks                            and benefits of the procedure and the sedation                            options and risks were discussed with the patient.                            All questions were answered, and informed consent                            was obtained. Prior Anticoagulants: The patient has                            taken no previous anticoagulant or antiplatelet  agents. ASA Grade Assessment: III - A patient with                            severe systemic disease. After reviewing the risks                            and benefits, the patient was deemed in                            satisfactory condition to undergo the procedure.                           After obtaining informed consent, the  colonoscope                            was passed under direct vision. Throughout the                            procedure, the patient's blood pressure, pulse, and                            oxygen saturations were monitored continuously. The                            [id number]-H190DL ([id number]) Olympus pediatric colonscope                            was introduced through the anus and advanced to the                            the cecum, identified by appendiceal orifice and                            ileocecal valve. The colonoscopy was performed with                            difficulty due to significant looping. Successful                            completion of the procedure was aided by                            straightening and shortening the scope to obtain                            bowel loop reduction and applying abdominal                            pressure. The patient tolerated the procedure well.                            The quality of the bowel preparation was adequate  and good. The ileocecal valve, appendiceal orifice,                            and rectum were photographed. Scope In: 10:50:26 AM Scope Out: 11:11:59 AM Scope Withdrawal Time: 0 hours 11 minutes 49 seconds  Total Procedure Duration: 0 hours 21 minutes 33 seconds  Findings:      The perianal and digital rectal examinations were normal.      Internal hemorrhoids were found during retroflexion. The hemorrhoids       were small and Grade I (internal hemorrhoids that do not prolapse).      The exam was otherwise normal throughout the examined Shaw. Impression:               - Internal hemorrhoids.                           - No specimens collected. Moderate Sedation:      N/A - MAC procedure Recommendation:           - Patient has a contact number available for                            emergencies. The signs and symptoms of potential                            delayed  complications were discussed with the                            patient. Return to normal activities tomorrow.                            Written discharge instructions were provided to the                            patient.                           - High fiber diet.                           - Repeat colonoscopy in 10 years for screening                            purposes.                           - Continue present medications. Procedure Code(s):        --- Professional ---                           [id number], Colonoscopy, flexible; diagnostic, including                            collection of specimen(s) by brushing or washing,                            when performed (separate procedure) Diagnosis Code(s):        ---  Professional ---                           Z12.11, Encounter for screening for malignant                            neoplasm of Shaw                           K64.0, First degree hemorrhoids CPT copyright 2019 American Medical Association. All rights reserved. The codes documented in this report are preliminary and upon coder review may  be revised to meet current compliance requirements. Lear Ng, MD 03/29/2020 11:22:32 AM This report has been signed electronically. Number of Addenda: 0

## 2020-03-29 NOTE — Anesthesia Procedure Notes (Signed)
Procedure Name: MAC Date/Time: 03/29/2020 10:46 AM Performed by: Lollie Sails, CRNA Pre-anesthesia Checklist: Patient identified, Emergency Drugs available, Suction available and Patient being monitored Oxygen Delivery Method: Simple face mask

## 2020-03-29 NOTE — Interval H&P Note (Signed)
History and Physical Interval Note:  03/29/2020 10:40 AM  Virginia Shaw  has presented today for surgery, with the diagnosis of Screening.  The various methods of treatment have been discussed with the patient and family. After consideration of risks, benefits and other options for treatment, the patient has consented to  Procedure(s): COLONOSCOPY WITH PROPOFOL (N/A) as a surgical intervention.  The patient's history has been reviewed, patient examined, no change in status, stable for surgery.  I have reviewed the patient's chart and labs.  Questions were answered to the patient's satisfaction.     Lear Ng

## 2020-03-29 NOTE — Anesthesia Preprocedure Evaluation (Signed)
Anesthesia Evaluation  Patient identified by MRN, date of birth, ID band Patient awake    Reviewed: Allergy & Precautions, NPO status , Patient's Chart, lab work & pertinent test results, reviewed documented beta blocker date and time , Unable to perform ROS - Chart review only  History of Anesthesia Complications (+) PONV and history of anesthetic complications  Airway Mallampati: I  TM Distance: >3 FB Neck ROM: Full    Dental no notable dental hx. (+) Chipped, Dental Advisory Given, Teeth Intact   Pulmonary neg pulmonary ROS,  12/18/2019 SARS coronavirus NEG   Pulmonary exam normal breath sounds clear to auscultation       Cardiovascular hypertension, Pt. on medications and Pt. on home beta blockers Normal cardiovascular exam Rhythm:Regular Rate:Normal  '12 Cath: no coronary spasm with Isuprel challenge '11 Cath: normal coronaries   Neuro/Psych PSYCHIATRIC DISORDERS Anxiety Somatization disorderSystemic sclerosis: spasm  TIA   GI/Hepatic Neg liver ROS, GERD  Medicated and Controlled,  Endo/Other  negative endocrine ROS  Renal/GU negative Renal ROS   H/o bladder spasm negative genitourinary   Musculoskeletal  (+) Arthritis ,   Abdominal Normal abdominal exam  (+)   Peds  Hematology negative hematology ROS (+)   Anesthesia Other Findings   Reproductive/Obstetrics S/p hysterectomy                             Anesthesia Physical  Anesthesia Plan  ASA: III  Anesthesia Plan: MAC   Post-op Pain Management:    Induction:   PONV Risk Score and Plan: 3 and Treatment may vary due to age or medical condition, Propofol infusion, TIVA and Promethazine  Airway Management Planned: Nasal Cannula and Natural Airway  Additional Equipment: None  Intra-op Plan:   Post-operative Plan:   Informed Consent: I have reviewed the patients History and Physical, chart, labs and discussed the  procedure including the risks, benefits and alternatives for the proposed anesthesia with the patient or authorized representative who has indicated his/her understanding and acceptance.     Dental advisory given  Plan Discussed with: CRNA  Anesthesia Plan Comments:         Anesthesia Quick Evaluation

## 2020-03-29 NOTE — Transfer of Care (Signed)
Immediate Anesthesia Transfer of Care Note  Patient: Virginia Shaw  Procedure(s) Performed: COLONOSCOPY WITH PROPOFOL (N/A )  Patient Location: PACU  Anesthesia Type:MAC  Level of Consciousness: awake  Airway & Oxygen Therapy: Patient Spontanous Breathing and Patient connected to face mask oxygen  Post-op Assessment: Report given to RN, Post -op Vital signs reviewed and stable and Patient moving all extremities X 4  Post vital signs: Reviewed and stable  Last Vitals:  Vitals Value Taken Time  BP    Temp    Pulse 76 03/29/20 1115  Resp    SpO2 100 % 03/29/20 1115  Vitals shown include unvalidated device data.  Last Pain:  Vitals:   03/29/20 0956  TempSrc: Oral  PainSc: 8          Complications: No apparent anesthesia complications

## 2020-03-29 NOTE — Discharge Instructions (Signed)

## 2020-03-29 NOTE — H&P (Signed)
Date of Initial H&P: 03/22/20  History reviewed, patient examined, no change in status, stable for surgery.

## 2020-03-30 ENCOUNTER — Encounter: Payer: Self-pay | Admitting: *Deleted

## 2020-03-31 ENCOUNTER — Ambulatory Visit: Payer: Medicaid Other | Admitting: Surgery

## 2020-04-01 NOTE — Anesthesia Postprocedure Evaluation (Signed)
Anesthesia Post Note  Patient: Virginia Shaw  Procedure(s) Performed: COLONOSCOPY WITH PROPOFOL (N/A )     Patient location during evaluation: Endoscopy Anesthesia Type: MAC Level of consciousness: awake Pain management: pain level controlled Vital Signs Assessment: post-procedure vital signs reviewed and stable Respiratory status: spontaneous breathing Cardiovascular status: stable Postop Assessment: no apparent nausea or vomiting Anesthetic complications: no    Last Vitals:  Vitals:   03/29/20 1116 03/29/20 1129  BP: 110/71 107/74  Pulse: 71 63  Resp: 18 17  Temp: 36.6 C   SpO2: 100% 100%    Last Pain:  Vitals:   03/30/20 1623  TempSrc:   PainSc: 4    Pain Goal:                   Huston Foley

## 2020-04-20 ENCOUNTER — Inpatient Hospital Stay: Payer: Medicaid Other | Admitting: Surgery

## 2020-04-21 ENCOUNTER — Emergency Department (HOSPITAL_BASED_OUTPATIENT_CLINIC_OR_DEPARTMENT_OTHER)
Admission: EM | Admit: 2020-04-21 | Discharge: 2020-04-21 | Disposition: A | Discharge status: Home / Self Care | Payer: Medicaid Other | Attending: Emergency Medicine | Admitting: Emergency Medicine

## 2020-04-21 ENCOUNTER — Other Ambulatory Visit: Payer: Self-pay

## 2020-04-21 ENCOUNTER — Telehealth: Payer: Self-pay | Admitting: Pulmonary Disease

## 2020-04-21 ENCOUNTER — Emergency Department (HOSPITAL_BASED_OUTPATIENT_CLINIC_OR_DEPARTMENT_OTHER): Payer: Medicaid Other

## 2020-04-21 ENCOUNTER — Encounter (HOSPITAL_BASED_OUTPATIENT_CLINIC_OR_DEPARTMENT_OTHER): Payer: Self-pay | Admitting: Emergency Medicine

## 2020-04-21 DIAGNOSIS — R0789 Other chest pain: Secondary | ICD-10-CM | POA: Insufficient documentation

## 2020-04-21 LAB — CBC
HCT: 43.7 % (ref 36.0–46.0)
Hemoglobin: 14.2 g/dL (ref 12.0–15.0)
MCH: 30.3 pg (ref 26.0–34.0)
MCHC: 32.5 g/dL (ref 30.0–36.0)
MCV: 93.4 fL (ref 80.0–100.0)
Platelets: 386 10*3/uL (ref 150–400)
RBC: 4.68 MIL/uL (ref 3.87–5.11)
RDW: 12.4 % (ref 11.5–15.5)
WBC: 6.7 10*3/uL (ref 4.0–10.5)
nRBC: 0 % (ref 0.0–0.2)

## 2020-04-21 LAB — BASIC METABOLIC PANEL
Anion gap: 11 (ref 5–15)
BUN: 10 mg/dL (ref 6–20)
CO2: 25 mmol/L (ref 22–32)
Calcium: 9.2 mg/dL (ref 8.9–10.3)
Chloride: 102 mmol/L (ref 98–111)
Creatinine, Ser: 0.76 mg/dL (ref 0.44–1.00)
GFR calc Af Amer: >60 mL/min (ref >60)
GFR calc non Af Amer: >60 mL/min (ref >60)
Glucose, Bld: 94 mg/dL (ref 70–99)
Potassium: 4.3 mmol/L (ref 3.5–5.1)
Sodium: 138 mmol/L (ref 135–145)

## 2020-04-21 LAB — TROPONIN I (HIGH SENSITIVITY): Troponin I (High Sensitivity): <2 ng/L (ref <18)

## 2020-04-21 NOTE — Telephone Encounter (Signed)
Patient is coughing or spitting up blood and chest pain , dizziness needs to go to ER , call 911 . Unable to assess this over the phone , needs emergency evaluation   Please contact office for sooner follow up if symptoms do not improve or worsen or seek emergency care

## 2020-04-21 NOTE — Telephone Encounter (Signed)
Spoke with patient. She verbalized understanding and stated that she will go to the ED at the [facility] in [facility]. Advised her to call us back if she needs anything. She verbalized understanding.

## 2020-04-21 NOTE — ED Provider Notes (Signed)
[facility] EMERGENCY DEPARTMENT Provider Note   CSN: [id number] Arrival date & time: 04/21/20  1324     History Chief Complaint  Patient presents with  . Chest Pain    Virginia Shaw is a 54 y.o. female presenting to the ED with complaint of intermittent left sided chest pain since Sunday. States initially symptoms began waking her from sleep with a movement in her left ribs below her breast. She states she felt her chest wall "jiggling." Shortly after she had a tightness sensation. She treated with aspirin with resolution.  She states later in the week she began having recurrence of symptoms with tightness pain and then sharp pain.  Symptoms are coming and going, last episode was this morning with some tightness and discomfort.  She reports chronic shortness of breath and nausea have been gradually worsening over some time.  She states today she cleared her throat and noticed some red mucus.  No new abdominal complaints, fevers, cough.  She has had no known Covid contacts.  No history of DVT/PE, recent trauma or immobilization.  She does not smoke tobacco.  No exogenous estrogen use.  The history is provided by the patient.       Past Medical History:  Diagnosis Date  . Abnormal involuntary movements(781.0)   . Allergic rhinitis, cause unspecified   . Anxiety state, unspecified   . Cervical spondylosis without myelopathy   . Disturbance of skin sensation   . GERD (gastroesophageal reflux disease)   . History of chicken pox   . Irritable bowel syndrome   . Low blood pressure   . Lumbago   . Myalgia and myositis, unspecified   . Osteoporosis   . Other and unspecified angina pectoris   . Other malaise and fatigue   . PONV (postoperative nausea and vomiting)   . 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 Somatization disorder   . Spasm of muscle   . Systemic sclerosis ([place])   . TIA (transient ischemic attack)   . Unspecified hereditary and idiopathic peripheral  neuropathy     Patient Active Problem List   Diagnosis Date Noted  . Special screening for malignant neoplasms, colon 03/29/2020  . Dysphagia 12/22/2019  . DDD (degenerative disc disease), cervical 08/11/2018  . DDD (degenerative disc disease), lumbar 08/11/2018  . Somatic complaints, multiple 03/14/2017  . Chronic pain syndrome 08/25/2014  . Irritable bowel syndrome (IBS) 01/12/2014  . 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 Chronic abdominal pain 01/11/2014  . Chronic venous insufficiency 10/30/2013  . Varicose veins 10/30/2013  . Chest tightness 03/25/2012  . 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 RECTAL BLEEDING 08/26/2008  . UTI'S, RECURRENT 08/26/2008    Past Surgical History:  Procedure Laterality Date  . ABDOMINAL HYSTERECTOMY  1997  . arnold chiari malformation  2013  . BACK SURGERY  2001, 2011,2013   Lumbar x 2  . BALLOON DILATION N/A 12/22/2019   Procedure: BALLOON DILATION;  Surgeon: Wilford Corner, MD;  Location: [facility];  Service: Endoscopy;  Laterality: N/A;  . barium swallow test  10/2019  . BLADDER SURGERY  2011  . CARDIAC CATHETERIZATION    . CATARACT EXTRACTION    . CERVICAL SPINE SURGERY  2013  . CHOLECYSTECTOMY    . COLONOSCOPY  2009   "Normal" Dr Sharlett Iles  . COLONOSCOPY  2002   "Normal" Dr Freddrick March  . COLONOSCOPY  ~2013   Dr Ferdinand Lango.  "Chest pain with the prep" = aborted  . COLONOSCOPY WITH PROPOFOL N/A 03/29/2020  Procedure: COLONOSCOPY WITH PROPOFOL;  Surgeon: Wilford Corner, MD;  Location: [facility];  Service: Endoscopy;  Laterality: N/A;  . CRANIECTOMY SUBOCCIPITAL W/ CERVICAL LAMINECTOMY / CHIARI  2012   Dr Annette Stable, Chiari decompression with suboccipital craniectomy and  . CYSTOCELE REPAIR  2002   Dr Gaetano Net  . ESOPHAGOGASTRODUODENOSCOPY (EGD) WITH PROPOFOL N/A 12/22/2019   Procedure: ESOPHAGOGASTRODUODENOSCOPY (EGD) WITH PROPOFOL;  Surgeon: Wilford Corner, MD;  Location: [facility];  Service: Endoscopy;  Laterality: N/A;    . [place], 2000  . PITUITARY EXCISION  2010   pituitary cyst removed  . RECTOCELE REPAIR  2002   Dr. Gaetano Net  . RENAL BIOPSY  08/04/2019  . tear duct tubes  2005  . TUBAL LIGATION       OB History   No obstetric history on file.     Family History  Problem Relation Age of Onset  . Hypertension Father   . Hyperlipidemia Father   . Diabetes Father   . Stroke Father   . Cancer Maternal Uncle        lung  . Cancer Paternal Aunt        lung  . Migraines Mother   . Heart attack Paternal Grandmother   . Heart attack Paternal Grandfather   . Hypertension Paternal Grandfather   . Migraines Sister     Social History   Tobacco Use  . Smoking status: Never Smoker  . Smokeless tobacco: Never Used  Vaping Use  . Vaping Use: Never used  Substance Use Topics  . Alcohol use: No  . Drug use: No    Home Medications Prior to Admission medications   Medication Sig Start Date End Date Taking? Authorizing Provider  amLODipine (NORVASC) 5 MG tablet Take 5 mg by mouth daily. Take 1 tablet (5 mg) by mouth in the morning    [provider]  aspirin EC 81 MG tablet Take 81 mg by mouth daily as needed (chest tightness/pain.).    [provider]  isosorbide mononitrate (IMDUR) 30 MG 24 hr tablet Take 30 mg by mouth daily.    [provider]  metoprolol succinate (TOPROL-XL) 25 MG 24 hr tablet Take 12.5 mg by mouth daily.    [provider]  nitroGLYCERIN (NITROSTAT) 0.4 MG SL tablet Place 0.4 mg under the tongue every 5 (five) minutes x 3 doses as needed for chest pain.  12/24/18   [provider]  ondansetron (ZOFRAN) 4 MG tablet Take 4 mg by mouth daily.    [provider]  Polyethyl Glycol-Propyl Glycol (SYSTANE) 0.4-0.3 % SOLN Place 1 drop into both eyes 3 (three) times daily as needed (dry eyes).    [provider]    Allergies    Iodinated diagnostic agents, Nsaids, Penicillins, Prednisone, Propoxyphene,  Morphine and related, Pantoprazole sodium, Acetaminophen, Benzonatate, Budesonide, Buprenorphine hcl, Ceftin [cefuroxime axetil], Cephalexin, Citric acid, Clindamycin, Codeine, Cortisone, Diazepam, Diclofenac, Diltiazem, Doxycycline, Esomeprazole, Hydrocodone-acetaminophen, Ibuprofen, Lidocaine, Loracarbef, Macrobid [nitrofurantoin monohyd macro], Maxalt [rizatriptan], Methylprednisolone sodium succ, Naproxen, Omeprazole, Oxycodone-acetaminophen, Phenazopyridine hcl, and Tomato  Review of Systems   Review of Systems  All other systems reviewed and are negative.   Physical Exam Updated Vital Signs BP 101/64 (BP Location: Right Arm)   Pulse 63   Temp 98.1 F (36.7 C) (Oral)   Resp 14   Ht 5' (1.524 m)   Wt 64.9 kg   SpO2 99%   BMI 27.93 kg/m   Physical Exam Vitals and nursing note reviewed.  Constitutional:  General: She is not in acute distress.    Appearance: She is well-developed. She is not diaphoretic.  HENT:     Head: Normocephalic and atraumatic.  Eyes:     Conjunctiva/sclera: Conjunctivae normal.  Cardiovascular:     Rate and Rhythm: Normal rate and regular rhythm.     Heart sounds: Normal heart sounds.  Pulmonary:     Effort: Pulmonary effort is normal.     Breath sounds: Normal breath sounds.     Comments: Tenderness to the left pectoral region, tenderness to the left chest wall under the breast and laterally.  Palpation reproduces symptoms.  No skin changes. Chest:     Chest wall: Tenderness present.  Abdominal:     General: Bowel sounds are normal.     Palpations: Abdomen is soft.  Musculoskeletal:     Cervical back: Normal range of motion and neck supple.  Skin:    General: Skin is warm.  Neurological:     Mental Status: She is alert.  Psychiatric:        Behavior: Behavior normal.     ED Results / Procedures / Treatments   Labs (all labs ordered are listed, but only abnormal results are displayed) Labs Reviewed  BASIC METABOLIC PANEL  CBC    TROPONIN I (HIGH SENSITIVITY)    EKG EKG Interpretation  Date/Time:  Thursday April 21 2020 13:27:10 EDT Ventricular Rate:  75 PR Interval:  142 QRS Duration: 66 QT Interval:  370 QTC Calculation: 413 R Axis:   68 Text Interpretation: Normal sinus rhythm Low voltage QRS Borderline ECG No significant change since last tracing Confirmed by Gareth Morgan [phone]) on 04/21/2020 2:34:39 PM   Radiology DG Chest 2 View  Result Date: 04/21/2020 CLINICAL DATA:  Chest pain EXAM: CHEST - 2 VIEW COMPARISON:  07/24/2018 FINDINGS: The heart size and mediastinal contours are within normal limits. No focal airspace consolidation, pleural effusion, or pneumothorax. Lower cervical ACDF. IMPRESSION: No active cardiopulmonary disease. Electronically Signed   By: Davina Poke D.O.   On: 04/21/2020 14:41    Procedures Procedures (including critical care time)  Medications Ordered in ED Medications - No data to display  ED Course  I have reviewed the triage vital signs and the nursing notes.  Pertinent labs & imaging results that were available during my care of the patient were reviewed by me and considered in my medical decision making (see chart for details).    MDM Rules/Calculators/A&P                          Patient presenting with intermittent left-sided chest pain since Sunday.  Initially began with what seems to be muscle twitching, described as a physical "jiggling" movement over her ribs, followed by tightness sensation with intermittent sharp pains.  On exam today, heart and lung sounds are normal.  Pain is reproducible with palpation to the chest wall.  No skin changes.  EKG is nonischemic.  Chest x-ray is clear.  Troponin is undetectable and labs are very reassuring.  Vital signs are stable.  Low risk [place].  Low suspicion for ACS.  Symptoms seem consistent with chest wall pain/spasm and instructed symptomatic management at home with close follow-up with patient's PCP/cardiologist.   Strict return precautions discussed.  Patient is agreeable to plan, well-appearing, and appropriate for discharge.  Final Clinical Impression(s) / ED Diagnoses Final diagnoses:  Chest wall pain    Rx / DC Orders ED Discharge Orders  None       Jahanna Raether, [place] N, PA-C 04/21/20 1548    Isla Pence, MD 04/21/20 1630

## 2020-04-21 NOTE — ED Triage Notes (Signed)
Left sided abdominal sensation felt like something was jiggling then had some epigastric pain on Sunday.  Took ASA and got better.  Pt states it came back yesterday and took one NTG.  It relieved the pain.  She related history of angina.  She also coughed up some blood.   No cold symptoms noted.  Some sob.

## 2020-04-21 NOTE — Telephone Encounter (Signed)
Called and spoke with pt who stated she spat up some blood that was about the size of a dime. Pt stated it was not mixed in any mucus and it was about the color of blood that is color red as if you just cut your finger. Pt stated she did this twice and has not done it since.  Pt said she also has been having some chest discomfort. Stated that she took a nitro yesterday and stated that did help with her tightness. Pt said that she is still not feeling right and stated she is also lightheaded.  Pt stated she spoke with her cardiologist today and they stated to her that they do not believe it is cardiac related.  Pt denies any complaints of fever, no wheezing. Pt does have complaints of SOB which she states comes and goes.  Pt stated she has taken all her daily meds. States she has not taken an aspirin since she spat up the blood this morning.  Tammy, please advise.

## 2020-04-21 NOTE — Discharge Instructions (Addendum)
Please read instructions below. Follow up with your primary care provider/cardiology in 3 days.  Return to the ER for new or worsening symptoms; including worsening chest pain, shortness of breath, pain that radiates to the arm or neck, pain or shortness of breath worsened with exertion.

## 2020-04-25 ENCOUNTER — Other Ambulatory Visit: Payer: Self-pay | Admitting: Urology

## 2020-04-25 DIAGNOSIS — R310 Gross hematuria: Secondary | ICD-10-CM

## 2020-04-29 ENCOUNTER — Other Ambulatory Visit: Payer: Self-pay | Admitting: Pulmonary Disease

## 2020-05-02 ENCOUNTER — Ambulatory Visit: Payer: Medicaid Other | Admitting: Specialist

## 2020-05-02 NOTE — Progress Notes (Deleted)
05/03/2020  Peer-to-peer:  Reference #[id number]

## 2020-05-02 NOTE — Progress Notes (Deleted)
Office Visit Note  Patient: Virginia Shaw             Date of Birth: 1966/09/30           MRN: [id number]             PCP: [name], MD Referring: [name], MD Visit Date: 05/10/2020 Occupation: @GUAROCC @  Subjective:  No chief complaint on file.   History of Present Illness: Virginia Shaw is a 54 y.o. female ***   Activities of Daily Living:  Patient reports morning stiffness for *** {minute/hour:19697}.   Patient {ACTIONS;DENIES/REPORTS:21021675::"Denies"} nocturnal pain.  Difficulty dressing/grooming: {ACTIONS;DENIES/REPORTS:21021675::"Denies"} Difficulty climbing stairs: {ACTIONS;DENIES/REPORTS:21021675::"Denies"} Difficulty getting out of chair: {ACTIONS;DENIES/REPORTS:21021675::"Denies"} Difficulty using hands for taps, buttons, cutlery, and/or writing: {ACTIONS;DENIES/REPORTS:21021675::"Denies"}  No Rheumatology ROS completed.   PMFS History:  Patient Active Problem List   Diagnosis Date Noted  . Special screening for malignant neoplasms, colon 03/29/2020  . Dysphagia 12/22/2019  . DDD (degenerative disc disease), cervical 08/11/2018  . DDD (degenerative disc disease), lumbar 08/11/2018  . Somatic complaints, multiple 03/14/2017  . Chronic pain syndrome 08/25/2014  . Irritable bowel syndrome (IBS) 01/12/2014  . 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 Chronic abdominal pain 01/11/2014  . Chronic venous insufficiency 10/30/2013  . Varicose veins 10/30/2013  . Chest tightness 03/25/2012  . 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 RECTAL BLEEDING 08/26/2008  . UTI'S, RECURRENT 08/26/2008    Past Medical History:  Diagnosis Date  . Abnormal involuntary movements(781.0)   . Allergic rhinitis, cause unspecified   . Anxiety state, unspecified   . Cervical spondylosis without myelopathy   . Disturbance of skin sensation   . GERD (gastroesophageal reflux disease)   . History of chicken pox   . Irritable bowel syndrome   . Low blood  pressure   . Lumbago   . Myalgia and myositis, unspecified   . Osteoporosis   . Other and unspecified angina pectoris   . Other malaise and fatigue   . PONV (postoperative nausea and vomiting)   . 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 Somatization disorder   . Spasm of muscle   . Systemic sclerosis ([place])   . TIA (transient ischemic attack)   . Unspecified hereditary and idiopathic peripheral neuropathy     Family History  Problem Relation Age of Onset  . Hypertension Father   . Hyperlipidemia Father   . Diabetes Father   . Stroke Father   . Cancer Maternal Uncle        lung  . Cancer Paternal Aunt        lung  . Migraines Mother   . Heart attack Paternal Grandmother   . Heart attack Paternal Grandfather   . Hypertension Paternal Grandfather   . Migraines Sister    Past Surgical History:  Procedure Laterality Date  . ABDOMINAL HYSTERECTOMY  1997  . arnold chiari malformation  2013  . BACK SURGERY  2001, 2011,2013   Lumbar x 2  . BALLOON DILATION N/A 12/22/2019   Procedure: BALLOON DILATION;  Surgeon: Wilford Corner, MD;  Location: [facility];  Service: Endoscopy;  Laterality: N/A;  . barium swallow test  10/2019  . BLADDER SURGERY  2011  . CARDIAC CATHETERIZATION    . CATARACT EXTRACTION    . CERVICAL SPINE SURGERY  2013  . CHOLECYSTECTOMY    . COLONOSCOPY  2009   "Normal" Dr Sharlett Iles  . COLONOSCOPY  2002   "Normal" Dr Freddrick March  . COLONOSCOPY  ~2013   Dr Ferdinand Lango.  "  Chest pain with the prep" = aborted  . COLONOSCOPY WITH PROPOFOL N/A 03/29/2020   Procedure: COLONOSCOPY WITH PROPOFOL;  Surgeon: Wilford Corner, MD;  Location: [facility];  Service: Endoscopy;  Laterality: N/A;  . CRANIECTOMY SUBOCCIPITAL W/ CERVICAL LAMINECTOMY / CHIARI  2012   Dr Annette Stable, Chiari decompression with suboccipital craniectomy and  . CYSTOCELE REPAIR  2002   Dr Gaetano Net  . ESOPHAGOGASTRODUODENOSCOPY (EGD) WITH PROPOFOL N/A 12/22/2019   Procedure: ESOPHAGOGASTRODUODENOSCOPY (EGD)  WITH PROPOFOL;  Surgeon: Wilford Corner, MD;  [facility]: [facility];  Service: Endoscopy;  Laterality: N/A;  . [place], 2000  . PITUITARY EXCISION  2010   pituitary cyst removed  . RECTOCELE REPAIR  2002   Dr. Gaetano Net  . RENAL BIOPSY  08/04/2019  . tear duct tubes  2005  . TUBAL LIGATION     Social History   Social History Narrative   Lives at home with children. ( 2 daughters)   Regular exercise-yes   Caffeine Use-no   GED   Not Working outside home    There is no immunization history on file for this patient.   Objective: Vital Signs: There were no vitals taken for this visit.   Physical Exam   Musculoskeletal Exam: ***  CDAI Exam: CDAI Score: -- Patient Global: --; Provider Global: -- Swollen: --; Tender: -- Joint Exam 05/10/2020   No joint exam has been documented for this visit   There is currently no information documented on the homunculus. Go to the Rheumatology activity and complete the homunculus joint exam.  Investigation: No additional findings.  Imaging: DG Chest 2 View  Result Date: 04/21/2020 CLINICAL DATA:  Chest pain EXAM: CHEST - 2 VIEW COMPARISON:  07/24/2018 FINDINGS: The heart size and mediastinal contours are within normal limits. No focal airspace consolidation, pleural effusion, or pneumothorax. Lower cervical ACDF. IMPRESSION: No active cardiopulmonary disease. Electronically Signed   By: Davina Poke D.O.   On: 04/21/2020 14:41    Recent Labs: Lab Results  Component Value Date   WBC 6.7 04/21/2020   HGB 14.2 04/21/2020   PLT 386 04/21/2020   NA 138 04/21/2020   K 4.3 04/21/2020   CL 102 04/21/2020   CO2 25 04/21/2020   GLUCOSE 94 04/21/2020   BUN 10 04/21/2020   CREATININE 0.76 04/21/2020   BILITOT 0.5 08/25/2014   ALKPHOS 77 08/25/2014   AST 28 08/25/2014   ALT 25 08/25/2014   PROT 7.6 08/25/2014   ALBUMIN 4.8 08/25/2014   CALCIUM 9.2 04/21/2020   GFRAA >60 04/21/2020    Speciality Comments: No  specialty comments available.  Procedures:  No procedures performed Allergies: Iodinated diagnostic agents, Penicillins, Prednisone, Propoxyphene, Acetaminophen, Benzonatate, Buprenorphine hcl, Citric acid, Clindamycin, Codeine, Diazepam, Diclofenac, Esomeprazole, Morphine and related, Nsaids, Pantoprazole sodium, Ceftin [cefuroxime axetil], Cephalexin, Cortisone, Diltiazem, Doxycycline, Hydrocodone-acetaminophen, Ibuprofen, Lidocaine, Loracarbef, Macrobid [nitrofurantoin monohyd macro], Maxalt [rizatriptan], Methylprednisolone sodium succ, Naproxen, Omeprazole, Oxycodone-acetaminophen, Phenazopyridine hcl, Tomato, and Budesonide   Assessment / Plan:     Visit Diagnoses: No diagnosis found.  Orders: No orders of the defined types were placed in this encounter.  No orders of the defined types were placed in this encounter.   Face-to-face time spent with patient was *** minutes. Greater than 50% of time was spent in counseling and coordination of care.  Follow-Up Instructions: No follow-ups on file.   Earnestine Mealing, CMA  Note - This record has been created using Editor, commissioning.  Chart creation errors have been sought, but may not  always  have been located. Such creation errors do not reflect on  the standard of medical care.

## 2020-05-03 ENCOUNTER — Other Ambulatory Visit: Payer: Self-pay

## 2020-05-03 ENCOUNTER — Ambulatory Visit: Payer: Medicaid Other | Admitting: Pulmonary Disease

## 2020-05-03 ENCOUNTER — Inpatient Hospital Stay: Admission: RE | Admit: 2020-05-03 | Payer: Medicaid Other | Source: Ambulatory Visit

## 2020-05-06 ENCOUNTER — Telehealth: Payer: Self-pay | Admitting: Pulmonary Disease

## 2020-05-06 ENCOUNTER — Other Ambulatory Visit: Payer: Medicaid Other

## 2020-05-06 NOTE — Telephone Encounter (Signed)
Called and spoke with pt letting her know the info stated by Dr. Mannam and she verbalized understanding. Nothing further needed. 

## 2020-05-06 NOTE — Telephone Encounter (Signed)
Called and spoke with patient in regards to her having rib pain that comes and goes. Patient states that she called into the office on 04/21/2020 about the same and also coughing up a small amount of blood. She is no longer coughing up blood and denies a cough at this time. She was advised to go to the emergency room and she did and everything came back normal. She states that she notices it more when she breathes in on the left side. It has got a little worse this past week.    Dr. Vaughan Browner please advise

## 2020-05-06 NOTE — Telephone Encounter (Signed)
Looks like a chest pain has been evaluated in the ED and by cardiology recently with negative work-up.  She can use over-the-counter lidocaine patch or Salonpas to see if it will help the pain  She has a CT scan scheduled for later this month.  I will will review those to see if there is any abnormalities.  Does she need a peer to peer for this? She may need a bronchoscopy.  I will discuss this at return visit.

## 2020-05-06 NOTE — Telephone Encounter (Signed)
No Insurance still denied will have to do an appeal. I have called the patient

## 2020-05-06 NOTE — Telephone Encounter (Signed)
Pt aware.

## 2020-05-10 ENCOUNTER — Ambulatory Visit: Payer: Medicaid Other | Admitting: Physician Assistant

## 2020-05-16 ENCOUNTER — Ambulatory Visit: Payer: Self-pay

## 2020-05-16 ENCOUNTER — Other Ambulatory Visit: Payer: Self-pay

## 2020-05-16 ENCOUNTER — Ambulatory Visit: Payer: Medicaid Other | Admitting: Physician Assistant

## 2020-05-16 ENCOUNTER — Encounter: Payer: Self-pay | Admitting: Physician Assistant

## 2020-05-16 VITALS — BP 88/62 | HR 82 | Resp 14 | Ht 59.75 in | Wt 143.6 lb

## 2020-05-16 DIAGNOSIS — M19042 Primary osteoarthritis, left hand: Secondary | ICD-10-CM

## 2020-05-16 DIAGNOSIS — M5030 Other cervical disc degeneration, unspecified cervical region: Secondary | ICD-10-CM

## 2020-05-16 DIAGNOSIS — R202 Paresthesia of skin: Secondary | ICD-10-CM

## 2020-05-16 DIAGNOSIS — M5416 Radiculopathy, lumbar region: Secondary | ICD-10-CM

## 2020-05-16 DIAGNOSIS — M5136 Other intervertebral disc degeneration, lumbar region: Secondary | ICD-10-CM

## 2020-05-16 DIAGNOSIS — M7918 Myalgia, other site: Secondary | ICD-10-CM | POA: Diagnosis not present

## 2020-05-16 DIAGNOSIS — M79641 Pain in right hand: Secondary | ICD-10-CM | POA: Diagnosis not present

## 2020-05-16 DIAGNOSIS — Z8719 Personal history of other diseases of the digestive system: Secondary | ICD-10-CM

## 2020-05-16 DIAGNOSIS — M79642 Pain in left hand: Secondary | ICD-10-CM

## 2020-05-16 DIAGNOSIS — M19041 Primary osteoarthritis, right hand: Secondary | ICD-10-CM

## 2020-05-16 DIAGNOSIS — I872 Venous insufficiency (chronic) (peripheral): Secondary | ICD-10-CM

## 2020-05-16 DIAGNOSIS — M51369 Other intervertebral disc degeneration, lumbar region without mention of lumbar back pain or lower extremity pain: Secondary | ICD-10-CM

## 2020-05-16 DIAGNOSIS — Z8742 Personal history of other diseases of the female genital tract: Secondary | ICD-10-CM

## 2020-05-16 DIAGNOSIS — D352 Benign neoplasm of pituitary gland: Secondary | ICD-10-CM

## 2020-05-16 DIAGNOSIS — Z8679 Personal history of other diseases of the circulatory system: Secondary | ICD-10-CM

## 2020-05-16 DIAGNOSIS — I201 Angina pectoris with documented spasm: Secondary | ICD-10-CM

## 2020-05-16 DIAGNOSIS — G894 Chronic pain syndrome: Secondary | ICD-10-CM

## 2020-05-16 NOTE — Progress Notes (Signed)
I called the patient to review x-ray results but had to leave a voicemail for her to return my call.   X-rays did not reveal any progression when compared to films from July 2020.  X-rays are consistent with mild osteoarthritis of both hands.

## 2020-05-16 NOTE — Progress Notes (Addendum)
Office Visit Note  Patient: Virginia Shaw             Date of Birth: 10/17/1966           MRN: [id number]             PCP: [name], MD Referring: [name], MD Visit Date: 05/16/2020 Occupation: @GUAROCC @  Subjective:  Pain in both hands   History of Present Illness: Virginia Shaw is a 54 y.o. female with history of osteoarthritis, DDD, and myofascial pain syndrome.  Patient presents today with ongoing pain in multiple joints including her neck, lower back, both hands, and the left ankle joint.  According to the patient she is been having intermittent swelling and stiffness in both hands.  She is also had nocturnal pain in both hands and both wrists which have been causing interrupted sleep at night.  She is followed by Dr. Louanne Skye for chronic lower back pain and has postponed her lumbar surgery for now.  She states that she has been experiencing pain and paresthesias in both hands and is concerned that she would not be able to use a walker if she proceeded with the lumbar spinal surgery at this time.  She states that she has had several nerve conduction studies in the past which revealed carpal tunnel but at that time she was not ready for surgery.  She would like to [facility] to discuss proceeding with carpal tunnel release surgery.  Activities of Daily Living:  Patient reports morning stiffness for 20 minutes.   Patient Reports nocturnal pain.  Difficulty dressing/grooming: Denies Difficulty climbing stairs: Denies Difficulty getting out of chair: Reports Difficulty using hands for taps, buttons, cutlery, and/or writing: Reports  Review of Systems  Constitutional: Negative for fatigue.  HENT: Negative for mouth sores, mouth dryness and nose dryness.   Eyes: Positive for dryness. Negative for pain and visual disturbance.  Respiratory: Negative for cough, hemoptysis, shortness of breath and difficulty breathing.   Cardiovascular: Negative  for chest pain, palpitations and hypertension.  Gastrointestinal: Positive for constipation and diarrhea. Negative for blood in stool.  Endocrine: Negative for increased urination.  Musculoskeletal: Positive for arthralgias, gait problem, joint pain, joint swelling, muscle weakness, morning stiffness and muscle tenderness. Negative for myalgias and myalgias.  Skin: Negative for color change, pallor, rash, hair loss, nodules/bumps, skin tightness, ulcers and sensitivity to sunlight.  Allergic/Immunologic: Negative for susceptible to infections.  Neurological: Positive for numbness. Negative for dizziness and headaches.  Hematological: Positive for bruising/bleeding tendency. Negative for swollen glands.  Psychiatric/Behavioral: Positive for sleep disturbance. Negative for depressed mood. The patient is not nervous/anxious.     PMFS History:  Patient Active Problem List   Diagnosis Date Noted  . Special screening for malignant neoplasms, colon 03/29/2020  . Dysphagia 12/22/2019  . DDD (degenerative disc disease), cervical 08/11/2018  . DDD (degenerative disc disease), lumbar 08/11/2018  . Somatic complaints, multiple 03/14/2017  . Chronic pain syndrome 08/25/2014  . Irritable bowel syndrome (IBS) 01/12/2014  . 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 Chronic abdominal pain 01/11/2014  . Chronic venous insufficiency 10/30/2013  . Varicose veins 10/30/2013  . Chest tightness 03/25/2012  . 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 RECTAL BLEEDING 08/26/2008  . UTI'S, RECURRENT 08/26/2008    Past Medical History:  Diagnosis Date  . Abnormal involuntary movements(781.0)   . Allergic rhinitis, cause unspecified   . Anxiety state, unspecified   . Cervical spondylosis without myelopathy   .  Disturbance of skin sensation   . GERD (gastroesophageal reflux disease)   . History of chicken pox   . Irritable bowel syndrome   . Low blood pressure   . Lumbago   . Myalgia and myositis,  unspecified   . Osteoporosis   . Other and unspecified angina pectoris   . Other malaise and fatigue   . PONV (postoperative nausea and vomiting)   . 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 Somatization disorder   . Spasm of muscle   . Systemic sclerosis ([place])   . TIA (transient ischemic attack)   . Unspecified hereditary and idiopathic peripheral neuropathy     Family History  Problem Relation Age of Onset  . Hypertension Father   . Hyperlipidemia Father   . Diabetes Father   . Stroke Father   . Cancer Maternal Uncle        lung  . Cancer Paternal Aunt        lung  . Migraines Mother   . Heart attack Paternal Grandmother   . Heart attack Paternal Grandfather   . Hypertension Paternal Grandfather   . Migraines Sister    Past Surgical History:  Procedure Laterality Date  . ABDOMINAL HYSTERECTOMY  1997  . arnold chiari malformation  2013  . BACK SURGERY  2001, 2011,2013   Lumbar x 2  . BALLOON DILATION N/A 12/22/2019   Procedure: BALLOON DILATION;  Surgeon: Wilford Corner, MD;  Location: [facility];  Service: Endoscopy;  Laterality: N/A;  . barium swallow test  10/2019  . BLADDER SURGERY  2011  . CARDIAC CATHETERIZATION    . CATARACT EXTRACTION    . CERVICAL SPINE SURGERY  2013  . CHOLECYSTECTOMY    . COLONOSCOPY  2009   "Normal" Dr Sharlett Iles  . COLONOSCOPY  2002   "Normal" Dr Freddrick March  . COLONOSCOPY  ~2013   Dr Ferdinand Lango.  "Chest pain with the prep" = aborted  . COLONOSCOPY WITH PROPOFOL N/A 03/29/2020   Procedure: COLONOSCOPY WITH PROPOFOL;  Surgeon: Wilford Corner, MD;  Location: [facility];  Service: Endoscopy;  Laterality: N/A;  . CRANIECTOMY SUBOCCIPITAL W/ CERVICAL LAMINECTOMY / CHIARI  2012   Dr Annette Stable, Chiari decompression with suboccipital craniectomy and  . CYSTOCELE REPAIR  2002   Dr Gaetano Net  . ESOPHAGOGASTRODUODENOSCOPY (EGD) WITH PROPOFOL N/A 12/22/2019   Procedure: ESOPHAGOGASTRODUODENOSCOPY (EGD) WITH PROPOFOL;  Surgeon: Wilford Corner, MD;   Location[facility];  Service: Endoscopy;  Laterality: N/A;  . [place], 2000  . PITUITARY EXCISION  2010   pituitary cyst removed  . RECTOCELE REPAIR  2002   Dr. Gaetano Net  . RENAL BIOPSY  08/04/2019  . tear duct tubes  2005  . TUBAL LIGATION     Social History   Social History Narrative   Lives at home with children. ( 2 daughters)   Regular exercise-yes   Caffeine Use-no   GED   Not Working outside home    There is no immunization history on file for this patient.   Objective: Vital Signs: BP (!) 88/62 (BP Location: Left Arm, Patient Position: Sitting, Cuff Size: Normal)   Pulse 82   Resp 14   Ht 4' 11.75" (1.518 m)   Wt 143 lb 9.6 oz (65.1 kg)   BMI 28.28 kg/m    Physical Exam Vitals and nursing note reviewed.  Constitutional:      Appearance: She is well-developed.  HENT:     Head: Normocephalic and atraumatic.  Eyes:     Conjunctiva/sclera:  Conjunctivae normal.  Pulmonary:     Effort: Pulmonary effort is normal.  Abdominal:     General: Bowel sounds are normal.     Palpations: Abdomen is soft.  Musculoskeletal:     Cervical back: Normal range of motion.  Lymphadenopathy:     Cervical: No cervical adenopathy.  Skin:    General: Skin is warm and dry.     Capillary Refill: Capillary refill takes less than 2 seconds.  Neurological:     Mental Status: She is alert and oriented to person, place, and time.  Psychiatric:        Behavior: Behavior normal.      Musculoskeletal Exam: Generalized hyperalgesia and positive tender points on exam.  C-spine has limited range of motion with lateral rotation.  She has painful limited range of motion of the lumbar spine.  Shoulder joints and elbow joints have good range of motion with no inflammation.  Wrist joints have good range of motion with no tenderness or synovitis.  She has complete fist formation bilaterally.  No synovitis of MCP or PIP joints were noted.  She has very mild PIP and DIP thickening  consistent with osteoarthritis of both hands.  Knee joints have good range of motion with no warmth or effusion.  Ankle joints have good range of motion with no discomfort.  She has tenderness of the left ankle joint on exam but no warmth or effusion was noted.  CDAI Exam: CDAI Score: -- Patient Global: --; Provider Global: -- Swollen: --; Tender: -- Joint Exam 05/16/2020   No joint exam has been documented for this visit   There is currently no information documented on the homunculus. Go to the Rheumatology activity and complete the homunculus joint exam.  Investigation: No additional findings.  Imaging: DG Chest 2 View  Result Date: 04/21/2020 CLINICAL DATA:  Chest pain EXAM: CHEST - 2 VIEW COMPARISON:  07/24/2018 FINDINGS: The heart size and mediastinal contours are within normal limits. No focal airspace consolidation, pleural effusion, or pneumothorax. Lower cervical ACDF. IMPRESSION: No active cardiopulmonary disease. Electronically Signed   By: Davina Poke D.O.   On: 04/21/2020 14:41    Recent Labs: Lab Results  Component Value Date   WBC 6.7 04/21/2020   HGB 14.2 04/21/2020   PLT 386 04/21/2020   NA 138 04/21/2020   K 4.3 04/21/2020   CL 102 04/21/2020   CO2 25 04/21/2020   GLUCOSE 94 04/21/2020   BUN 10 04/21/2020   CREATININE 0.76 04/21/2020   BILITOT 0.5 08/25/2014   ALKPHOS 77 08/25/2014   AST 28 08/25/2014   ALT 25 08/25/2014   PROT 7.6 08/25/2014   ALBUMIN 4.8 08/25/2014   CALCIUM 9.2 04/21/2020   GFRAA >60 04/21/2020    Speciality Comments: No specialty comments available.  Procedures:  No procedures performed Allergies: Iodinated diagnostic agents, Penicillins, Prednisone, Propoxyphene, Acetaminophen, Benzonatate, Buprenorphine hcl, Citric acid, Clindamycin, Codeine, Diazepam, Diclofenac, Esomeprazole, Morphine and related, Nsaids, Pantoprazole sodium, Ceftin [cefuroxime axetil], Cephalexin, Cortisone, Diltiazem, Doxycycline,  Hydrocodone-acetaminophen, Ibuprofen, Lidocaine, Loracarbef, Macrobid [nitrofurantoin monohyd macro], Maxalt [rizatriptan], Methylprednisolone sodium succ, Naproxen, Omeprazole, Oxycodone-acetaminophen, Phenazopyridine hcl, Tomato, and Budesonide   Assessment / Plan:     Visit Diagnoses: Primary osteoarthritis of both hands: She presents today with increased pain in both hands, which has been worsening over the past several months.  She has been experiencing intermittent joint inflammation and joint stiffness in both hands.  She has also had some nocturnal pain, and she is experiencing paresthesias in both  hands.  According to the patient she has had 2 nerve conduction studies in the past which were consistent with carpal tunnel syndrome.  At that time she was not ready to proceed with carpal tunnel release but would like a referral to an orthopedist at [facility] to discuss surgical release.  Referral will be placed today.  She also requested to update x-rays of both hands today.  X-rays from 06/01/2019 were reviewed with the patient today in the office.  At that time she was found to have mild PIP and DIP narrowing but no erosive changes were noted.  We will obtain the following labs today to complete the work-up.  She is advised to notify us if she develops increased joint pain or joint swelling.  She will follow-up in the office in 5 months.   Pain in both hands -She has been experiencing increased pain in both hands.  According to the patient she has noticed intermittent joint swelling and increased stiffness.  She is also been experiencing paresthesias in both hands intermittently.  According to the patient she has had a nerve conduction study performed at [facility] as well as [facility] in the past which were consistent with carpal tunnel syndrome.  She states that at that time she did not want to proceed with carpal tunnel surgery but would like a referral to [facility] to discuss proceeding with surgical  release.  We will place the referral today.  We will also update x-rays of both hands to assess for radiographic progression.  We will also obtain the following labs to complete the work-up.  We discussed natural anti-inflammatories that she can try taking.  She was given a handout of information about these supplements.  Plan: 14-3-3 eta Protein, Cyclic citrul peptide antibody, IgG, Rheumatoid factor, ANA, Sedimentation rate, XR Hand 2 View Right, XR Hand 2 View Left.  X-ray of bilateral hands showed mild osteoarthritic changes which were unchanged from her previous x-rays of 2020.  Myofascial pain: She experiences intermittent myalgias and muscle tenderness due to underlying myofascial pain syndrome.  She continues to have chronic fatigue secondary to insomnia.  She has been experiencing increased nocturnal pain which has been worsening her insomnia.  DDD (degenerative disc disease), cervical: Chronic pain.  She has painful limited range of motion with lateral rotation.  She has no symptoms of radiculopathy at this time.  She was advised to continue to follow-up with Dr. Louanne Skye.  DDD (degenerative disc disease), lumbar: Chronic pain.  She is followed closely by Dr. Louanne Skye.  According to the patient she was scheduled for surgery on 04/05/2020 but canceled due to being apprehensive and nervous about her recovery.  According to the patient she will be having to use a walker during recovery.  And is concerned due to the increased pain she has been having in both hands.  She would like to complete the work-up for the paresthesias and increased pain that she has been experiencing in both hands first.  Radiculopathy, lumbar region: She has ongoing radiculopathy especially down the left lower extremity.  She is followed closely by Dr. Louanne Skye  She is planning on proceeding with surgery as recommended once she undergoes carpal tunnel release.  Chronic pain syndrome: She cannot tolerate taking NSAIDs or Tylenol for pain  relief.  Other medical conditions are listed as follows:   Pituitary adenoma ([place])  Chronic venous insufficiency  Coronary artery spasm ([facility])  History of varicose veins  History of gastroesophageal reflux (GERD)  History of  IBS  History of ovarian cyst   Orders: Orders Placed This Encounter  Procedures  . XR Hand 2 View Right  . XR Hand 2 View Left  . 14-3-3 eta Protein  . Cyclic citrul peptide antibody, IgG  . Rheumatoid factor  . ANA  . Sedimentation rate  . Ambulatory referral to Orthopedic Surgery   No orders of the defined types were placed in this encounter.   Face-to-face time spent with patient was 30 minutes. Greater than 50% of time was spent in counseling and coordination of care.  Follow-Up Instructions: Return in about 5 months (around 10/16/2020) for Osteoarthritis, DDD, Myofascial pain.   Ofilia Neas, PA-C  Note - This record has been created using Dragon software.  Chart creation errors have been sought, but may not always  have been located. Such creation errors do not reflect on  the standard of medical care.

## 2020-05-17 ENCOUNTER — Ambulatory Visit
Admission: RE | Admit: 2020-05-17 | Discharge: 2020-05-17 | Disposition: A | Discharge status: Home / Self Care | Payer: Medicaid Other | Source: Ambulatory Visit | Attending: Urology | Admitting: Urology

## 2020-05-17 ENCOUNTER — Ambulatory Visit
Admission: RE | Admit: 2020-05-17 | Discharge: 2020-05-17 | Disposition: A | Discharge status: Home / Self Care | Payer: Medicaid Other | Source: Ambulatory Visit | Attending: Pulmonary Disease | Admitting: Pulmonary Disease

## 2020-05-17 DIAGNOSIS — R042 Hemoptysis: Secondary | ICD-10-CM

## 2020-05-17 DIAGNOSIS — R310 Gross hematuria: Secondary | ICD-10-CM

## 2020-05-22 LAB — SEDIMENTATION RATE: Sed Rate: 2 mm/h (ref 0–30)

## 2020-05-22 LAB — 14-3-3 ETA PROTEIN: 14-3-3 eta Protein: 0.4 ng/mL — ABNORMAL HIGH (ref <0.2)

## 2020-05-22 LAB — ANA: Anti Nuclear Antibody (ANA): NEGATIVE

## 2020-05-22 LAB — CYCLIC CITRUL PEPTIDE ANTIBODY, IGG: Cyclic Citrullin Peptide Ab: <16 UNITS

## 2020-05-22 LAB — RHEUMATOID FACTOR: Rheumatoid fact SerPl-aCnc: <14 IU/mL (ref <14)

## 2020-05-23 NOTE — Progress Notes (Signed)
14-3-3 eta is positive.  RF and anti-CCP negative.  ANA negative.  ESR WNL.   Please call the patient to notify her of these results.  Please schedule an ultrasound of both hands to assess for synovitis since her 14-3-3 eta is positive.

## 2020-05-26 ENCOUNTER — Ambulatory Visit: Payer: Medicaid Other | Admitting: Orthopaedic Surgery

## 2020-05-26 ENCOUNTER — Other Ambulatory Visit: Payer: Self-pay

## 2020-05-26 ENCOUNTER — Encounter: Payer: Self-pay | Admitting: Orthopaedic Surgery

## 2020-05-26 VITALS — Ht 60.0 in | Wt 141.6 lb

## 2020-05-26 DIAGNOSIS — G5601 Carpal tunnel syndrome, right upper limb: Secondary | ICD-10-CM | POA: Diagnosis not present

## 2020-05-26 DIAGNOSIS — G5602 Carpal tunnel syndrome, left upper limb: Secondary | ICD-10-CM | POA: Diagnosis not present

## 2020-05-28 NOTE — Progress Notes (Signed)
Office Visit Note   Patient: Virginia Shaw           Date of Birth: Mar 18, 1966           MRN: [id number] Visit Date: 05/26/2020              Requested by: Ofilia Neas, PA-C [id number] [place],  [place] [address] PCP: [name], MD   Assessment & Plan: Visit Diagnoses:  1. Right carpal tunnel syndrome   2. Left carpal tunnel syndrome     Plan: Impression is bilateral carpal tunnel syndrome.  I reviewed the nerve conduction studies from [facility] which confirmed this.  She would like to start with a left carpal tunnel release first as well as removal of a symptomatic left long finger ganglion cyst.  Risk benefits rehab recovery reviewed in detail.  Questions encouraged and answered.  Follow-Up Instructions: Return if symptoms worsen or fail to improve.   Orders:  No orders of the defined types were placed in this encounter.  No orders of the defined types were placed in this encounter.     Procedures: No procedures performed   Clinical Data: No additional findings.   Subjective: Chief Complaint  Patient presents with  . Right Hand - Pain  . Left Hand - Pain    Virginia Shaw is a 54 year old female comes in for evaluation of bilateral hand pain and numbness with nerve conduction studies showing bilateral carpal tunnel syndrome.  She has numbness and tingling and burning in her hands that wake her up at night.  She has had this problem for years.  She is here for formal surgical consultation..  She is a referral from Dr. Estanislado Pandy.     Review of Systems  Constitutional: Negative.   HENT: Negative.   Eyes: Negative.   Respiratory: Negative.   Cardiovascular: Negative.   Endocrine: Negative.   Musculoskeletal: Negative.   Neurological: Negative.   Hematological: Negative.   Psychiatric/Behavioral: Negative.   All other systems reviewed and are negative.    Objective: Vital Signs: Ht 5' (1.524 m)   Wt 141 lb 9.6 oz (64.2 kg)   BMI 27.65 kg/m     Physical Exam Vitals and nursing note reviewed.  Constitutional:      Appearance: She is well-developed.  HENT:     Head: Normocephalic and atraumatic.  Pulmonary:     Effort: Pulmonary effort is normal.  Abdominal:     Palpations: Abdomen is soft.  Musculoskeletal:     Cervical back: Neck supple.  Skin:    General: Skin is warm.     Capillary Refill: Capillary refill takes less than 2 seconds.  Neurological:     Mental Status: She is alert and oriented to person, place, and time.  Psychiatric:        Behavior: Behavior normal.        Thought Content: Thought content normal.        Judgment: Judgment normal.     Ortho Exam Bilateral hand showed no muscle atrophy.  She has subjective paresthesias in the median nerve distribution.  Positive carpal tunnel compressive signs. Left long finger shows a hard palpable mass consistent with a ganglion cyst of the flexor tendon sheath at the finger palmar flexion crease. Specialty Comments:  No specialty comments available.  Imaging: No results found.   PMFS History: Patient Active Problem List   Diagnosis Date Noted  . Special screening for malignant neoplasms, colon 03/29/2020  . Dysphagia 12/22/2019  .  DDD (degenerative disc disease), cervical 08/11/2018  . DDD (degenerative disc disease), lumbar 08/11/2018  . Somatic complaints, multiple 03/14/2017  . Chronic pain syndrome 08/25/2014  . Irritable bowel syndrome (IBS) 01/12/2014  . 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 Chronic abdominal pain 01/11/2014  . Chronic venous insufficiency 10/30/2013  . Varicose veins 10/30/2013  . Chest tightness 03/25/2012  . 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 RECTAL BLEEDING 08/26/2008  . UTI'S, RECURRENT 08/26/2008   Past Medical History:  Diagnosis Date  . Abnormal involuntary movements(781.0)   . Allergic rhinitis, cause unspecified   . Anxiety state, unspecified   . Cervical spondylosis without myelopathy   . Disturbance  of skin sensation   . GERD (gastroesophageal reflux disease)   . History of chicken pox   . Irritable bowel syndrome   . Low blood pressure   . Lumbago   . Myalgia and myositis, unspecified   . Osteoporosis   . Other and unspecified angina pectoris   . Other malaise and fatigue   . PONV (postoperative nausea and vomiting)   . 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 Somatization disorder   . Spasm of muscle   . Systemic sclerosis ([place])   . TIA (transient ischemic attack)   . Unspecified hereditary and idiopathic peripheral neuropathy     Family History  Problem Relation Age of Onset  . Hypertension Father   . Hyperlipidemia Father   . Diabetes Father   . Stroke Father   . Cancer Maternal Uncle        lung  . Cancer Paternal Aunt        lung  . Migraines Mother   . Heart attack Paternal Grandmother   . Heart attack Paternal Grandfather   . Hypertension Paternal Grandfather   . Migraines Sister     Past Surgical History:  Procedure Laterality Date  . ABDOMINAL HYSTERECTOMY  1997  . arnold chiari malformation  2013  . BACK SURGERY  2001, 2011,2013   Lumbar x 2  . BALLOON DILATION N/A 12/22/2019   Procedure: BALLOON DILATION;  Surgeon: Wilford Corner, MD;  [facility];  Service: Endoscopy;  Laterality: N/A;  . barium swallow test  10/2019  . BLADDER SURGERY  2011  . CARDIAC CATHETERIZATION    . CATARACT EXTRACTION    . CERVICAL SPINE SURGERY  2013  . CHOLECYSTECTOMY    . COLONOSCOPY  2009   "Normal" Dr Sharlett Iles  . COLONOSCOPY  2002   "Normal" Dr Freddrick March  . COLONOSCOPY  ~2013   Dr Ferdinand Lango.  "Chest pain with the prep" = aborted  . COLONOSCOPY WITH PROPOFOL N/A 03/29/2020   Procedure: COLONOSCOPY WITH PROPOFOL;  Surgeon: Wilford Corner, MD;  [facility];  Service: Endoscopy;  Laterality: N/A;  . CRANIECTOMY SUBOCCIPITAL W/ CERVICAL LAMINECTOMY / CHIARI  2012   Dr Annette Stable, Chiari decompression with suboccipital craniectomy and  . CYSTOCELE  REPAIR  2002   Dr Gaetano Net  . ESOPHAGOGASTRODUODENOSCOPY (EGD) WITH PROPOFOL N/A 12/22/2019   Procedure: ESOPHAGOGASTRODUODENOSCOPY (EGD) WITH PROPOFOL;  Surgeon: Wilford Corner, MD;  [facility];  Service: Endoscopy;  Laterality: N/A;  . [place], 2000  . PITUITARY EXCISION  2010   pituitary cyst removed  . RECTOCELE REPAIR  2002   Dr. Gaetano Net  . RENAL BIOPSY  08/04/2019  . tear duct tubes  2005  . TUBAL LIGATION     Social History   Occupational History    Employer: UNEMPLOYED  Tobacco Use  . Smoking status:  Never Smoker  . Smokeless tobacco: Never Used  Vaping Use  . Vaping Use: Never used  Substance and Sexual Activity  . Alcohol use: No  . Drug use: No  . Sexual activity: Never    Birth control/protection: Abstinence

## 2020-06-01 ENCOUNTER — Other Ambulatory Visit: Payer: Medicaid Other | Admitting: Rheumatology

## 2020-06-01 ENCOUNTER — Encounter: Payer: Self-pay | Admitting: Rheumatology

## 2020-06-01 ENCOUNTER — Ambulatory Visit: Payer: Self-pay

## 2020-06-01 ENCOUNTER — Other Ambulatory Visit: Payer: Self-pay

## 2020-06-01 ENCOUNTER — Ambulatory Visit (INDEPENDENT_AMBULATORY_CARE_PROVIDER_SITE_OTHER): Payer: Medicaid Other | Admitting: Rheumatology

## 2020-06-01 VITALS — BP 94/67 | HR 78 | Resp 14 | Ht 59.75 in | Wt 142.4 lb

## 2020-06-01 DIAGNOSIS — M0609 Rheumatoid arthritis without rheumatoid factor, multiple sites: Secondary | ICD-10-CM | POA: Diagnosis not present

## 2020-06-01 DIAGNOSIS — M19042 Primary osteoarthritis, left hand: Secondary | ICD-10-CM

## 2020-06-01 DIAGNOSIS — I872 Venous insufficiency (chronic) (peripheral): Secondary | ICD-10-CM

## 2020-06-01 DIAGNOSIS — Z8719 Personal history of other diseases of the digestive system: Secondary | ICD-10-CM

## 2020-06-01 DIAGNOSIS — Z8679 Personal history of other diseases of the circulatory system: Secondary | ICD-10-CM

## 2020-06-01 DIAGNOSIS — M7918 Myalgia, other site: Secondary | ICD-10-CM | POA: Diagnosis not present

## 2020-06-01 DIAGNOSIS — M5416 Radiculopathy, lumbar region: Secondary | ICD-10-CM

## 2020-06-01 DIAGNOSIS — M5030 Other cervical disc degeneration, unspecified cervical region: Secondary | ICD-10-CM

## 2020-06-01 DIAGNOSIS — G894 Chronic pain syndrome: Secondary | ICD-10-CM

## 2020-06-01 DIAGNOSIS — M51369 Other intervertebral disc degeneration, lumbar region without mention of lumbar back pain or lower extremity pain: Secondary | ICD-10-CM

## 2020-06-01 DIAGNOSIS — I201 Angina pectoris with documented spasm: Secondary | ICD-10-CM

## 2020-06-01 DIAGNOSIS — M19041 Primary osteoarthritis, right hand: Secondary | ICD-10-CM | POA: Diagnosis not present

## 2020-06-01 DIAGNOSIS — M5136 Other intervertebral disc degeneration, lumbar region: Secondary | ICD-10-CM

## 2020-06-01 DIAGNOSIS — D352 Benign neoplasm of pituitary gland: Secondary | ICD-10-CM

## 2020-06-01 DIAGNOSIS — Z8742 Personal history of other diseases of the female genital tract: Secondary | ICD-10-CM

## 2020-06-01 NOTE — Patient Instructions (Addendum)
Hydroxychloroquine tablets What is this medicine? HYDROXYCHLOROQUINE (hye drox ee KLOR oh kwin) is used to treat rheumatoid arthritis and systemic lupus erythematosus. It is also used to treat malaria. This medicine may be used for other purposes; ask your health care provider or pharmacist if you have questions. COMMON BRAND NAME(S): Plaquenil, Quineprox What should I tell my health care provider before I take this medicine? They need to know if you have any of these conditions:  diabetes  eye disease, vision problems  G6PD deficiency  heart disease  history of irregular heartbeat  if you often drink alcohol  kidney disease  liver disease  porphyria  psoriasis  an unusual or allergic reaction to chloroquine, hydroxychloroquine, other medicines, foods, dyes, or preservatives  pregnant or trying to get pregnant  breast-feeding How should I use this medicine? Take this medicine by mouth with a glass of water. Follow the directions on the prescription label. Do not cut, crush or chew this medicine. Swallow the tablets whole. Take this medicine with food. Avoid taking antacids within 4 hours of taking this medicine. It is best to separate these medicines by at least 4 hours. Take your medicine at regular intervals. Do not take it more often than directed. Take all of your medicine as directed even if you think you are better. Do not skip doses or stop your medicine early. Talk to your pediatrician regarding the use of this medicine in children. While this drug may be prescribed for selected conditions, precautions do apply. Overdosage: If you think you have taken too much of this medicine contact a poison control center or emergency room at once. NOTE: This medicine is only for you. Do not share this medicine with others. What if I miss a dose? If you miss a dose, take it as soon as you can. If it is almost time for your next dose, take only that dose. Do not take double or extra  doses. What may interact with this medicine? Do not take this medicine with any of the following medications:  cisapride  dronedarone  pimozide  thioridazine This medicine may also interact with the following medications:  ampicillin  antacids  cimetidine  cyclosporine  digoxin  kaolin  medicines for diabetes, like insulin, glipizide, glyburide  medicines for seizures like carbamazepine, phenobarbital, phenytoin  mefloquine  methotrexate  other medicines that prolong the QT interval (cause an abnormal heart rhythm)  praziquantel This list may not describe all possible interactions. Give your health care provider a list of all the medicines, herbs, non-prescription drugs, or dietary supplements you use. Also tell them if you smoke, drink alcohol, or use illegal drugs. Some items may interact with your medicine. What should I watch for while using this medicine? Visit your health care professional for regular checks on your progress. Tell your health care professional if your symptoms do not start to get better or if they get worse. You may need blood work done while you are taking this medicine. If you take other medicines that can affect heart rhythm, you may need more testing. Talk to your health care professional if you have questions. Your vision may be tested before and during use of this medicine. Tell your health care professional right away if you have any change in your eyesight. What side effects may I notice from receiving this medicine? Side effects that you should report to your doctor or health care professional as soon as possible:  allergic reactions like skin rash, itching or hives,   swelling of the face, lips, or tongue  changes in vision  decreased hearing or ringing of the ears  muscle weakness  redness, blistering, peeling or loosening of the skin, including inside the mouth  sensitivity to light  signs and symptoms of a dangerous change in  heartbeat or heart rhythm like chest pain; dizziness; fast or irregular heartbeat; palpitations; feeling faint or lightheaded, falls; breathing problems  signs and symptoms of liver injury like dark yellow or brown urine; general ill feeling or flu-like symptoms; light-colored stools; loss of appetite; nausea; right upper belly pain; unusually weak or tired; yellowing of the eyes or skin  signs and symptoms of low blood sugar such as feeling anxious; confusion; dizziness; increased hunger; unusually weak or tired; sweating; shakiness; cold; irritable; headache; blurred vision; fast heartbeat; loss of consciousness  suicidal thoughts  uncontrollable head, mouth, neck, arm, or leg movements Side effects that usually do not require medical attention (report to your doctor or health care professional if they continue or are bothersome):  diarrhea  dizziness  hair loss  headache  irritable  loss of appetite  nausea, vomiting  stomach pain This list may not describe all possible side effects. Call your doctor for medical advice about side effects. You may report side effects to FDA at 1-800-FDA-1088. Where should I keep my medicine? Keep out of the reach of children. Store at room temperature between 15 and 30 degrees C (59 and 86 degrees F). Protect from moisture and light. Throw away any unused medicine after the expiration date. NOTE: This sheet is a summary. It may not cover all possible information. If you have questions about this medicine, talk to your doctor, pharmacist, or health care provider.  2020 Elsevier/Gold Standard (2019-03-02 12:56:32)  

## 2020-06-01 NOTE — Progress Notes (Signed)
Office Visit Note  Patient: David Towson             Date of Birth: September 16, 1966           MRN: [id number]             PCP: [name], MD Referring: [name], MD Visit Date: 06/01/2020 Occupation: @GUAROCC @  Subjective:  Discuss ultrasound/medication options   History of Present Illness: Jakyiah Briones is a 54 y.o. female with history of joint pain, osteoarthritis and myofascial pain syndrome.  She states she continues to have discomfort in her cervical spine and her bilateral hands.  She has discomfort in her low ankles off and on.  She gives history of intermittent swelling.  She states that she was seen by Dr. Erlinda Hong who is planning to do bilateral carpal tunnel release.  She continues to have numbness in her bilateral hands.  She stopped sure if it is coming from her wrist or her cervical spine.  She seen Dr. Louanne Skye in the past.  Activities of Daily Living:  Patient reports morning stiffness for 10-15  minutes.   Patient Reports nocturnal pain.  Difficulty dressing/grooming: Denies Difficulty climbing stairs: Denies Difficulty getting out of chair: Reports Difficulty using hands for taps, buttons, cutlery, and/or writing: Denies  Review of Systems  Constitutional: Negative for fatigue.  HENT: Negative for mouth sores, mouth dryness and nose dryness.   Eyes: Positive for pain and dryness. Negative for visual disturbance.  Respiratory: Negative for cough, hemoptysis, shortness of breath and difficulty breathing.   Cardiovascular: Negative for chest pain, palpitations, hypertension and swelling in legs/feet.  Gastrointestinal: Negative for blood in stool, constipation and diarrhea.  Endocrine: Negative for increased urination.  Genitourinary: Positive for difficulty urinating and painful urination.  Musculoskeletal: Positive for arthralgias, joint pain, joint swelling, myalgias, morning stiffness, muscle tenderness and myalgias. Negative for muscle weakness.    Skin: Negative for color change, pallor, rash, hair loss, nodules/bumps, skin tightness, ulcers and sensitivity to sunlight.  Allergic/Immunologic: Negative for susceptible to infections.  Neurological: Positive for numbness. Negative for dizziness, headaches and weakness.  Hematological: Negative for swollen glands.  Psychiatric/Behavioral: Positive for sleep disturbance. Negative for depressed mood. The patient is not nervous/anxious.     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[place]) 01/11/2014   Chronic abdominal pain 01/11/2014   Chronic venous insufficiency 10/30/2013   Varicose veins 10/30/2013   Chest tightness 03/25/2012   PITUITARY ADENOMA, 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   RECTAL BLEEDING 08/26/2008   UTI'S, RECURRENT 08/26/2008    Past Medical History:  Diagnosis Date   Abnormal involuntary movements(781.0)    Allergic rhinitis, cause unspecified    Anxiety state, unspecified    Cervical spondylosis without myelopathy    Disturbance of skin sensation    GERD (gastroesophageal reflux disease)    History of chicken pox    Irritable bowel syndrome    Low blood pressure    Lumbago    Myalgia and myositis, unspecified    Osteoporosis    Other and unspecified angina pectoris    Other malaise and fatigue    PONV (postoperative nausea and vomiting)    Prinzmetal's  angina ([place]) 01/11/2014   Somatization disorder    Spasm of muscle    Systemic sclerosis (HCC)    TIA (transient ischemic attack)    Unspecified hereditary and idiopathic peripheral neuropathy     Family History  Problem  Relation Age of Onset   Hypertension Father    Hyperlipidemia Father    Diabetes Father    Stroke Father    Cancer Maternal Uncle        lung   Cancer Paternal Aunt        lung   Migraines Mother    Heart attack Paternal Grandmother    Heart attack Paternal Grandfather    Hypertension Paternal Grandfather    Migraines Sister    Past Surgical History:  Procedure Laterality Date   ABDOMINAL HYSTERECTOMY  1997   arnold chiari malformation  2013   BACK SURGERY  2001, 2011,2013   Lumbar x 2   BALLOON DILATION N/A 12/22/2019   Procedure: BALLOON DILATION;  Surgeon: Wilford Corner, MD;  Location: [facility];  Service: Endoscopy;  Laterality: N/A;   barium swallow test  10/2019   BLADDER SURGERY  2011   CARDIAC CATHETERIZATION     CATARACT EXTRACTION     CERVICAL SPINE SURGERY  2013   CHOLECYSTECTOMY     COLONOSCOPY  2009   "Normal" Dr Sharlett Iles   COLONOSCOPY  2002   "Normal" Dr Freddrick March   COLONOSCOPY  ~2013   Dr Ferdinand Lango.  "Chest pain with the prep" = aborted   COLONOSCOPY WITH PROPOFOL N/A 03/29/2020   Procedure: COLONOSCOPY WITH PROPOFOL;  Surgeon: Wilford Corner, MD;  Location: [facility];  Service: Endoscopy;  Laterality: N/A;   CRANIECTOMY SUBOCCIPITAL W/ CERVICAL LAMINECTOMY / CHIARI  2012   Dr Annette Stable, Chiari decompression with suboccipital craniectomy and   CYSTOCELE REPAIR  2002   Dr Gaetano Net   ESOPHAGOGASTRODUODENOSCOPY (EGD) WITH PROPOFOL N/A 12/22/2019   Procedure: ESOPHAGOGASTRODUODENOSCOPY (EGD) WITH PROPOFOL;  Surgeon: Wilford Corner, MD;  Location: [facility];  Service: Endoscopy;  Laterality: N/A;   OOPHORECTOMY  1999, 2000   PITUITARY EXCISION  2010   pituitary cyst removed   RECTOCELE REPAIR  2002   Dr. Gaetano Net   RENAL BIOPSY  08/04/2019   tear duct tubes  2005   TUBAL LIGATION     Social History   Social History Narrative   Lives at home with children. ( 2 daughters)   Regular exercise-yes   Caffeine  Use-no   GED   Not Working outside home    There is no immunization history on file for this patient.   Objective: Vital Signs: BP 94/67 (BP Location: Left Arm, Patient Position: Sitting, Cuff Size: Normal)    Pulse 78    Resp 14    Ht 4' 11.75" (1.518 m)    Wt 142 lb 6.4 oz (64.6 kg)    BMI 28.04 kg/m    Physical Exam Vitals and nursing note reviewed.  Constitutional:      Appearance: She is well-developed.  HENT:     Head: Normocephalic and atraumatic.  Eyes:     Conjunctiva/sclera: Conjunctivae normal.  Pulmonary:     Effort: Pulmonary effort is normal.  Abdominal:     General: Bowel sounds are normal.     Palpations: Abdomen is soft.  Musculoskeletal:     Cervical back: Normal range of motion.  Lymphadenopathy:     Cervical: No cervical adenopathy.  Skin:    General: Skin is warm and dry.  Capillary Refill: Capillary refill takes less than 2 seconds.  Neurological:     Mental Status: She is alert and oriented to person, place, and time.  Psychiatric:        Behavior: Behavior normal.      Musculoskeletal Exam:   CDAI Exam: CDAI Score: 10  Patient Global: 5 mm; Provider Global: 5 mm Swollen: 0 ; Tender: 10  Joint Exam 06/01/2020      Right  Left  Glenohumeral   Tender   Tender  Wrist   Tender   Tender  MCP 2   Tender   Tender  MCP 3   Tender     MCP 5   Tender   Tender  Cervical Spine   Tender        Investigation: No additional findings.  Imaging: CT ABDOMEN PELVIS WO CONTRAST  Result Date: 05/17/2020 CLINICAL DATA:  Chronic hemoptysis and gross hematuria. EXAM: CT CHEST, ABDOMEN AND PELVIS WITHOUT CONTRAST TECHNIQUE: Multidetector CT imaging of the chest, abdomen and pelvis was performed following the standard protocol without IV contrast. COMPARISON:  Abdomen/pelvis CT 09/16/2019, 03/08/2014, 12/13/2016. Chest CT 07/08/2019 FINDINGS: CT CHEST FINDINGS Cardiovascular: The heart size is normal. No substantial pericardial effusion. Atherosclerotic  calcification is noted in the wall of the thoracic aorta. Mediastinum/Nodes: No mediastinal lymphadenopathy. No evidence for gross hilar lymphadenopathy although assessment is limited by the lack of intravenous contrast on today's study. The esophagus has normal imaging features. There is no axillary lymphadenopathy. Lungs/Pleura: No evidence of bronchiectasis. No substantial bronchial wall thickening.Tiny calcified granuloma in the left lower lobe is stable. No suspicious pulmonary nodule or mass. No focal airspace consolidation. No pleural effusion. Musculoskeletal: Sclerotic lesion in the left scapula is stable since 07/08/2019, likely benign. CT ABDOMEN PELVIS FINDINGS Hepatobiliary: No focal abnormality in the liver on this study without intravenous contrast. Gallbladder is surgically absent. No intrahepatic or extrahepatic biliary dilation. Pancreas: No focal mass lesion. No dilatation of the main duct. No intraparenchymal cyst. No peripancreatic edema. Spleen: No splenomegaly. No focal mass lesion. Adrenals/Urinary Tract: No adrenal nodule or mass. 6 mm subcapsular lesion anterior interpolar left kidney is too small to characterize on this noncontrast CT but is unchanged comparing back to a study from 12/13/2016 and was present on the study from 03/08/2014, most suggestive of benign etiology. Similar 5 mm exophytic lesion extreme lower pole left kidney is also unchanged since 2018. Right kidney unremarkable. No stones are seen in either kidney or ureter. No secondary changes in either kidney or ureter. No bladder stones. Stomach/Bowel: Stomach is unremarkable. No gastric wall thickening. No evidence of outlet obstruction. Duodenum is normally positioned as is the ligament of Treitz. No small bowel wall thickening. No small bowel dilatation. The terminal ileum is normal. The appendix is normal. No gross colonic mass. No colonic wall thickening. Vascular/Lymphatic: No abdominal aortic aneurysm. There is no  gastrohepatic or hepatoduodenal ligament lymphadenopathy. No retroperitoneal or mesenteric lymphadenopathy. No pelvic sidewall lymphadenopathy. Reproductive: The uterus is surgically absent. There is no adnexal mass. Other: No intraperitoneal free fluid. Musculoskeletal: No worrisome lytic or sclerotic osseous abnormality. IMPRESSION: 1. No acute findings in the chest. No findings to explain the patient's history of hemoptysis. 2. No findings to explain the history of hematuria. Tiny lesions in the left kidney are stable since 03/08/2014, consistent with benign etiology. Electronically Signed   By: Misty Stanley M.D.   On: 05/17/2020 16:23   CT Chest Wo Contrast  Result Date: 05/17/2020 CLINICAL DATA:  Chronic  hemoptysis and gross hematuria. EXAM: CT CHEST, ABDOMEN AND PELVIS WITHOUT CONTRAST TECHNIQUE: Multidetector CT imaging of the chest, abdomen and pelvis was performed following the standard protocol without IV contrast. COMPARISON:  Abdomen/pelvis CT 09/16/2019, 03/08/2014, 12/13/2016. Chest CT 07/08/2019 FINDINGS: CT CHEST FINDINGS Cardiovascular: The heart size is normal. No substantial pericardial effusion. Atherosclerotic calcification is noted in the wall of the thoracic aorta. Mediastinum/Nodes: No mediastinal lymphadenopathy. No evidence for gross hilar lymphadenopathy although assessment is limited by the lack of intravenous contrast on today's study. The esophagus has normal imaging features. There is no axillary lymphadenopathy. Lungs/Pleura: No evidence of bronchiectasis. No substantial bronchial wall thickening.Tiny calcified granuloma in the left lower lobe is stable. No suspicious pulmonary nodule or mass. No focal airspace consolidation. No pleural effusion. Musculoskeletal: Sclerotic lesion in the left scapula is stable since 07/08/2019, likely benign. CT ABDOMEN PELVIS FINDINGS Hepatobiliary: No focal abnormality in the liver on this study without intravenous contrast. Gallbladder is  surgically absent. No intrahepatic or extrahepatic biliary dilation. Pancreas: No focal mass lesion. No dilatation of the main duct. No intraparenchymal cyst. No peripancreatic edema. Spleen: No splenomegaly. No focal mass lesion. Adrenals/Urinary Tract: No adrenal nodule or mass. 6 mm subcapsular lesion anterior interpolar left kidney is too small to characterize on this noncontrast CT but is unchanged comparing back to a study from 12/13/2016 and was present on the study from 03/08/2014, most suggestive of benign etiology. Similar 5 mm exophytic lesion extreme lower pole left kidney is also unchanged since 2018. Right kidney unremarkable. No stones are seen in either kidney or ureter. No secondary changes in either kidney or ureter. No bladder stones. Stomach/Bowel: Stomach is unremarkable. No gastric wall thickening. No evidence of outlet obstruction. Duodenum is normally positioned as is the ligament of Treitz. No small bowel wall thickening. No small bowel dilatation. The terminal ileum is normal. The appendix is normal. No gross colonic mass. No colonic wall thickening. Vascular/Lymphatic: No abdominal aortic aneurysm. There is no gastrohepatic or hepatoduodenal ligament lymphadenopathy. No retroperitoneal or mesenteric lymphadenopathy. No pelvic sidewall lymphadenopathy. Reproductive: The uterus is surgically absent. There is no adnexal mass. Other: No intraperitoneal free fluid. Musculoskeletal: No worrisome lytic or sclerotic osseous abnormality. IMPRESSION: 1. No acute findings in the chest. No findings to explain the patient's history of hemoptysis. 2. No findings to explain the history of hematuria. Tiny lesions in the left kidney are stable since 03/08/2014, consistent with benign etiology. Electronically Signed   By: Misty Stanley M.D.   On: 05/17/2020 16:23   [place] Extrem Up Bilat Comp  Result Date: 06/01/2020 Ultrasound examination of bilateral hands was performed per EULAR recommendations. Using 12  MHz transducer, grayscale and power Doppler bilateral second, third, and fifth MCP joints and bilateral wrist joints both dorsal and volar aspects were evaluated to look for synovitis or tenosynovitis. The findings were there was  synovitis in left second MCP, bilateral fifth MCPs and bilateral wrist joints on ultrasound examination. Right median nerve was 0.07 cm squares which was within normal limits and left median nerve was 0.09 cm squares which was within normal limits. Impression: Ultrasound examination showed synovitis in bilateral hands and wrist joints.  Bilateral median nerves are within normal limits.  XR Hand 2 View Left  Result Date: 05/16/2020 Mild PIP and DIP narrowing was noted.  No MCP, intercarpal or radiocarpal joint space narrowing was noted.  No radiographic progression was noted when compared to the x-rays of 2020. Impression: These findings are consistent with mild osteoarthritis of  the hand.  XR Hand 2 View Right  Result Date: 05/16/2020 Mild PIP and DIP narrowing was noted.  No MCP, intercarpal or radiocarpal joint space narrowing was noted.  No radiographic progression was noted when compared to the x-rays of 2020. 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   Recent Labs: Lab Results  Component Value Date   WBC 6.7 04/21/2020   HGB 14.2 04/21/2020   PLT 386 04/21/2020   NA 138 04/21/2020   K 4.3 04/21/2020   CL 102 04/21/2020   CO2 25 04/21/2020   GLUCOSE 94 04/21/2020   BUN 10 04/21/2020   CREATININE 0.76 04/21/2020   BILITOT 0.5 08/25/2014   ALKPHOS 77 08/25/2014   AST 28 08/25/2014   ALT 25 08/25/2014   PROT 7.6 08/25/2014   ALBUMIN 4.8 08/25/2014   CALCIUM 9.2 04/21/2020   GFRAA >60 04/21/2020    Speciality Comments: No specialty comments available.  Procedures:  No procedures performed Allergies: Iodinated diagnostic agents, Penicillins, Prednisone, Propoxyphene, Acetaminophen, Benzonatate, Buprenorphine hcl,  Citric acid, Clindamycin, Codeine, Diazepam, Diclofenac, Esomeprazole, Morphine and related, Nsaids, Pantoprazole sodium, Ceftin [cefuroxime axetil], Cephalexin, Cortisone, Diltiazem, Doxycycline, Hydrocodone-acetaminophen, Ibuprofen, Lidocaine, Loracarbef, Macrobid [nitrofurantoin monohyd macro], Maxalt [rizatriptan], Methylprednisolone sodium succ, Naproxen, Omeprazole, Oxycodone-acetaminophen, Phenazopyridine hcl, Tomato, and Budesonide   Assessment / Plan:     Visit Diagnoses: Rheumatoid arthritis of multiple sites with negative rheumatoid factor (HCC) - RF negative, anti-CCP negative, 14 3 3  at a positive, positive synovitis on ultrasound examination.  Her bilateral median nerves are within normal limits.  I detailed discussion with her regarding the findings on the ultrasound examination and 14 3 3  eta antibody.  I still do not see synovitis on physical examination.  I discussed the possible use of hydroxychloroquine.  Indications side effects contraindications were discussed at length.  Patient does not want to start on immunosuppressive agents at this point.  She states she will try natural anti-inflammatories.  I have given her a handout on hydroxychloroquine to review.  I have advised her to contact me in case her symptoms get worse.  Primary osteoarthritis of both hands -she has DIP and PIP thickening in bilateral hands consistent with osteoarthritis.  Plan: [place] Extrem Up Bilat Comp.  Ultrasound showed synovitis in bilateral wrist joint symptoms.  Myofascial pain-she continues to have generalized pain and positive tender points.  DDD (degenerative disc disease), cervical-she continues to have neck pain.  DDD (degenerative disc disease), lumbar-she has ongoing lower back pain and is followed by Dr. Louanne Skye.  Other medical problems are listed as follows:  Radiculopathy, lumbar region  Chronic pain syndrome  Pituitary adenoma (HCC)  Chronic venous insufficiency  Coronary artery spasm  (HCC)  History of varicose veins  History of gastroesophageal reflux (GERD)  History of IBS  History of ovarian cyst  Patient has not had COVID-19 vaccine.  She is concerned about the side effects of COVID-19 vaccine.  She is also concerned that she has multiple allergies and she may be more sensitive to vaccination.  I have advised her to continue using mask, practice social distancing and hand hygiene.  Orders: Orders Placed This Encounter  Procedures   [place] Extrem Up Bilat Comp   No orders of the defined types were placed in this encounter.     Follow-Up Instructions: Return in about 5 months (around 11/01/2020) for Rheumatoid arthritis, Osteoarthritis.   Bo Merino, MD  Note - This record has been created using Editor, commissioning.  Chart creation errors  have been sought, but may not always  have been located. Such creation errors do not reflect on  the standard of medical care.

## 2020-06-03 ENCOUNTER — Telehealth: Payer: Self-pay | Admitting: *Deleted

## 2020-06-03 NOTE — Telephone Encounter (Signed)
Patient contacted the office and states she is having a dull pain in her left leg. Patient states it feels like "something is rubbing up against her leg.". Patient states it started below her bottom and travels to the inside of her leg and down the left knee. Patient states it hurts worse when sitting. Patient  It started 06/02/2020 and she has not had any relief. Patient would like to know what may be causing this. Please advise.

## 2020-06-03 NOTE — Telephone Encounter (Signed)
It would be very difficult to determine without examination.  She will need an office visit to be evaluated or she may see her PCP.

## 2020-06-03 NOTE — Telephone Encounter (Signed)
Patient has been scheduled for 06/06/2020 for evaluation.

## 2020-06-06 ENCOUNTER — Encounter: Payer: Self-pay | Admitting: Physician Assistant

## 2020-06-06 ENCOUNTER — Ambulatory Visit: Payer: Medicaid Other | Admitting: Physician Assistant

## 2020-06-06 ENCOUNTER — Other Ambulatory Visit: Payer: Self-pay

## 2020-06-06 VITALS — BP 88/58 | HR 75 | Resp 14 | Ht 59.75 in | Wt 142.2 lb

## 2020-06-06 DIAGNOSIS — M5136 Other intervertebral disc degeneration, lumbar region: Secondary | ICD-10-CM

## 2020-06-06 DIAGNOSIS — G5603 Carpal tunnel syndrome, bilateral upper limbs: Secondary | ICD-10-CM | POA: Diagnosis not present

## 2020-06-06 DIAGNOSIS — Z8719 Personal history of other diseases of the digestive system: Secondary | ICD-10-CM

## 2020-06-06 DIAGNOSIS — M0609 Rheumatoid arthritis without rheumatoid factor, multiple sites: Secondary | ICD-10-CM

## 2020-06-06 DIAGNOSIS — Z8679 Personal history of other diseases of the circulatory system: Secondary | ICD-10-CM

## 2020-06-06 DIAGNOSIS — D352 Benign neoplasm of pituitary gland: Secondary | ICD-10-CM

## 2020-06-06 DIAGNOSIS — M5030 Other cervical disc degeneration, unspecified cervical region: Secondary | ICD-10-CM

## 2020-06-06 DIAGNOSIS — I201 Angina pectoris with documented spasm: Secondary | ICD-10-CM

## 2020-06-06 DIAGNOSIS — M19041 Primary osteoarthritis, right hand: Secondary | ICD-10-CM | POA: Diagnosis not present

## 2020-06-06 DIAGNOSIS — M19042 Primary osteoarthritis, left hand: Secondary | ICD-10-CM

## 2020-06-06 DIAGNOSIS — M5416 Radiculopathy, lumbar region: Secondary | ICD-10-CM

## 2020-06-06 DIAGNOSIS — M7918 Myalgia, other site: Secondary | ICD-10-CM

## 2020-06-06 DIAGNOSIS — I872 Venous insufficiency (chronic) (peripheral): Secondary | ICD-10-CM

## 2020-06-06 DIAGNOSIS — G894 Chronic pain syndrome: Secondary | ICD-10-CM

## 2020-06-06 DIAGNOSIS — Z8742 Personal history of other diseases of the female genital tract: Secondary | ICD-10-CM

## 2020-06-06 NOTE — Progress Notes (Signed)
Office Visit Note  Patient: Virginia Shaw             Date of Birth: 03-18-1966           MRN: [id number]             PCP: [name], MD Referring: [name], MD Visit Date: 06/06/2020 Occupation: @GUAROCC @  Subjective:  Lower back pain   History of Present Illness: Virginia Shaw is a 54 y.o. female with history of rheumatoid arthritis, myofascial pain, and DDD.  She presents today with increased lower back and left hip joint pain.  She states that the pain has progressively been getting worse over the past 4 days.  She states that she has not contacted Dr. Louanne Skye yet.  She states that she is not quite ready to proceed with surgery at this time.  She states that she was planning on having her hernia repaired prior to the lumbar fusion.  She states that she is having pain in the groin bilaterally especially when getting in and out of the car.  She had an MRI of the left hip on 11/16/2019 which was unremarkable. She remains apprehensive of starting on Plaquenil at this time due to planning these upcoming surgeries.  She continues to have paresthesias in both hands due to carpal tunnel bilaterally.  She plans on calling Dr. Phoebe Sharps office today to discuss proceeding with surgery.  Activities of Daily Living:  Patient reports morning stiffness for 15  minutes.   Patient Reports nocturnal pain.  Difficulty dressing/grooming: Reports Difficulty climbing stairs: Reports Difficulty getting out of chair: Reports Difficulty using hands for taps, buttons, cutlery, and/or writing: Reports  Review of Systems  Constitutional: Positive for fatigue.  HENT: Positive for mouth dryness. Negative for mouth sores and nose dryness.   Eyes: Positive for dryness. Negative for pain and visual disturbance.  Respiratory: Negative for cough, hemoptysis, shortness of breath and difficulty breathing.   Cardiovascular: Positive for swelling in legs/feet. Negative for chest pain, palpitations and  hypertension.  Gastrointestinal: Positive for constipation and diarrhea. Negative for blood in stool.  Endocrine: Negative for increased urination.  Genitourinary: Positive for painful urination.  Musculoskeletal: Positive for arthralgias, joint pain, joint swelling, morning stiffness and muscle tenderness. Negative for myalgias, muscle weakness and myalgias.  Skin: Negative for color change, pallor, rash, hair loss, nodules/bumps, redness, skin tightness, ulcers and sensitivity to sunlight.  Allergic/Immunologic: Negative for susceptible to infections.  Neurological: Positive for numbness and weakness. Negative for dizziness and headaches.  Hematological: Negative for bruising/bleeding tendency and swollen glands.  Psychiatric/Behavioral: Positive for sleep disturbance. Negative for depressed mood. The patient is not nervous/anxious.     PMFS History:  Patient Active Problem List   Diagnosis Date Noted  . Special screening for malignant neoplasms, colon 03/29/2020  . Dysphagia 12/22/2019  . DDD (degenerative disc disease), cervical 08/11/2018  . DDD (degenerative disc disease), lumbar 08/11/2018  . Somatic complaints, multiple 03/14/2017  . Chronic pain syndrome 08/25/2014  . Irritable bowel syndrome (IBS) 01/12/2014  . 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 Chronic abdominal pain 01/11/2014  . Chronic venous insufficiency 10/30/2013  . Varicose veins 10/30/2013  . Chest tightness 03/25/2012  . 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 RECTAL BLEEDING 08/26/2008  . UTI'S, RECURRENT 08/26/2008    Past Medical History:  Diagnosis Date  . Abnormal involuntary movements(781.0)   . Allergic rhinitis, cause unspecified   . Anxiety state, unspecified   . Cervical spondylosis without myelopathy   .  Disturbance of skin sensation   . GERD (gastroesophageal reflux disease)   . History of chicken pox   . Irritable bowel syndrome   . Low blood pressure   . Lumbago   . Myalgia and  myositis, unspecified   . Osteoporosis   . Other and unspecified angina pectoris   . Other malaise and fatigue   . PONV (postoperative nausea and vomiting)   . 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 Somatization disorder   . Spasm of muscle   . Systemic sclerosis ([place])   . TIA (transient ischemic attack)   . Unspecified hereditary and idiopathic peripheral neuropathy     Family History  Problem Relation Age of Onset  . Hypertension Father   . Hyperlipidemia Father   . Diabetes Father   . Stroke Father   . Cancer Maternal Uncle        lung  . Cancer Paternal Aunt        lung  . Migraines Mother   . Heart attack Paternal Grandmother   . Heart attack Paternal Grandfather   . Hypertension Paternal Grandfather   . Migraines Sister    Past Surgical History:  Procedure Laterality Date  . ABDOMINAL HYSTERECTOMY  1997  . arnold chiari malformation  2013  . BACK SURGERY  2001, 2011,2013   Lumbar x 2  . BALLOON DILATION N/A 12/22/2019   Procedure: BALLOON DILATION;  Surgeon: Wilford Corner, MD;  Location: [facility];  Service: Endoscopy;  Laterality: N/A;  . barium swallow test  10/2019  . BLADDER SURGERY  2011  . CARDIAC CATHETERIZATION    . CATARACT EXTRACTION    . CERVICAL SPINE SURGERY  2013  . CHOLECYSTECTOMY    . COLONOSCOPY  2009   "Normal" Dr Sharlett Iles  . COLONOSCOPY  2002   "Normal" Dr Freddrick March  . COLONOSCOPY  ~2013   Dr Ferdinand Lango.  "Chest pain with the prep" = aborted  . COLONOSCOPY WITH PROPOFOL N/A 03/29/2020   Procedure: COLONOSCOPY WITH PROPOFOL;  Surgeon: Wilford Corner, MD;  Location: [facility];  Service: Endoscopy;  Laterality: N/A;  . CRANIECTOMY SUBOCCIPITAL W/ CERVICAL LAMINECTOMY / CHIARI  2012   Dr Annette Stable, Chiari decompression with suboccipital craniectomy and  . CYSTOCELE REPAIR  2002   Dr Gaetano Net  . ESOPHAGOGASTRODUODENOSCOPY (EGD) WITH PROPOFOL N/A 12/22/2019   Procedure: ESOPHAGOGASTRODUODENOSCOPY (EGD) WITH PROPOFOL;  Surgeon: Wilford Corner, MD;  Location: [facility];  Service: Endoscopy;  Laterality: N/A;  . [place], 2000  . PITUITARY EXCISION  2010   pituitary cyst removed  . RECTOCELE REPAIR  2002   Dr. Gaetano Net  . RENAL BIOPSY  08/04/2019  . tear duct tubes  2005  . TUBAL LIGATION     Social History   Social History Narrative   Lives at home with children. ( 2 daughters)   Regular exercise-yes   Caffeine Use-no   GED   Not Working outside home    There is no immunization history on file for this patient.   Objective: Vital Signs: BP (!) 88/58 (BP Location: Left Arm, Patient Position: Sitting, Cuff Size: Normal)   Pulse 75   Resp 14   Ht 4' 11.75" (1.518 m)   Wt 142 lb 3.2 oz (64.5 kg)   BMI 28.00 kg/m    Physical Exam Vitals and nursing note reviewed.  Constitutional:      Appearance: She is well-developed.  HENT:     Head: Normocephalic and atraumatic.  Eyes:     Conjunctiva/sclera:  Conjunctivae normal.  Pulmonary:     Effort: Pulmonary effort is normal.  Abdominal:     General: Bowel sounds are normal.     Palpations: Abdomen is soft.  Musculoskeletal:     Cervical back: Normal range of motion.  Lymphadenopathy:     Cervical: No cervical adenopathy.  Skin:    General: Skin is warm and dry.     Capillary Refill: Capillary refill takes less than 2 seconds.  Neurological:     Mental Status: She is alert and oriented to person, place, and time.  Psychiatric:        Behavior: Behavior normal.      Musculoskeletal Exam: Generalized hyperalgesia.  C-spine, thoracic spine, and lumbar spine have painful range of motion.  She has midline spinal tenderness and tenderness over both SI joints.  Shoulder joints, elbow joints, wrist joints, MCPs, PIPs, DIPs have good range of motion with no synovitis.  She has tenderness of both wrist joints and all MCP joints.  She is able to make a complete fist bilaterally.  She has limited and painful range of motion of both hip joints.   Tenderness over trochanteric bursa bilaterally.  Knee joints have good range of motion with no warmth or effusion.  Ankle joints have good range of motion with no tenderness or inflammation.  CDAI Exam: CDAI Score: -- Patient Global: --; Provider Global: -- Swollen: --; Tender: -- Joint Exam 06/06/2020   No joint exam has been documented for this visit   There is currently no information documented on the homunculus. Go to the Rheumatology activity and complete the homunculus joint exam.  Investigation: No additional findings.  Imaging: CT ABDOMEN PELVIS WO CONTRAST  Result Date: 05/17/2020 CLINICAL DATA:  Chronic hemoptysis and gross hematuria. EXAM: CT CHEST, ABDOMEN AND PELVIS WITHOUT CONTRAST TECHNIQUE: Multidetector CT imaging of the chest, abdomen and pelvis was performed following the standard protocol without IV contrast. COMPARISON:  Abdomen/pelvis CT 09/16/2019, 03/08/2014, 12/13/2016. Chest CT 07/08/2019 FINDINGS: CT CHEST FINDINGS Cardiovascular: The heart size is normal. No substantial pericardial effusion. Atherosclerotic calcification is noted in the wall of the thoracic aorta. Mediastinum/Nodes: No mediastinal lymphadenopathy. No evidence for gross hilar lymphadenopathy although assessment is limited by the lack of intravenous contrast on today's study. The esophagus has normal imaging features. There is no axillary lymphadenopathy. Lungs/Pleura: No evidence of bronchiectasis. No substantial bronchial wall thickening.Tiny calcified granuloma in the left lower lobe is stable. No suspicious pulmonary nodule or mass. No focal airspace consolidation. No pleural effusion. Musculoskeletal: Sclerotic lesion in the left scapula is stable since 07/08/2019, likely benign. CT ABDOMEN PELVIS FINDINGS Hepatobiliary: No focal abnormality in the liver on this study without intravenous contrast. Gallbladder is surgically absent. No intrahepatic or extrahepatic biliary dilation. Pancreas: No focal  mass lesion. No dilatation of the main duct. No intraparenchymal cyst. No peripancreatic edema. Spleen: No splenomegaly. No focal mass lesion. Adrenals/Urinary Tract: No adrenal nodule or mass. 6 mm subcapsular lesion anterior interpolar left kidney is too small to characterize on this noncontrast CT but is unchanged comparing back to a study from 12/13/2016 and was present on the study from 03/08/2014, most suggestive of benign etiology. Similar 5 mm exophytic lesion extreme lower pole left kidney is also unchanged since 2018. Right kidney unremarkable. No stones are seen in either kidney or ureter. No secondary changes in either kidney or ureter. No bladder stones. Stomach/Bowel: Stomach is unremarkable. No gastric wall thickening. No evidence of outlet obstruction. Duodenum is normally positioned as is  the ligament of Treitz. No small bowel wall thickening. No small bowel dilatation. The terminal ileum is normal. The appendix is normal. No gross colonic mass. No colonic wall thickening. Vascular/Lymphatic: No abdominal aortic aneurysm. There is no gastrohepatic or hepatoduodenal ligament lymphadenopathy. No retroperitoneal or mesenteric lymphadenopathy. No pelvic sidewall lymphadenopathy. Reproductive: The uterus is surgically absent. There is no adnexal mass. Other: No intraperitoneal free fluid. Musculoskeletal: No worrisome lytic or sclerotic osseous abnormality. IMPRESSION: 1. No acute findings in the chest. No findings to explain the patient's history of hemoptysis. 2. No findings to explain the history of hematuria. Tiny lesions in the left kidney are stable since 03/08/2014, consistent with benign etiology. Electronically Signed   By: Misty Stanley M.D.   On: 05/17/2020 16:23   CT Chest Wo Contrast  Result Date: 05/17/2020 CLINICAL DATA:  Chronic hemoptysis and gross hematuria. EXAM: CT CHEST, ABDOMEN AND PELVIS WITHOUT CONTRAST TECHNIQUE: Multidetector CT imaging of the chest, abdomen and pelvis was  performed following the standard protocol without IV contrast. COMPARISON:  Abdomen/pelvis CT 09/16/2019, 03/08/2014, 12/13/2016. Chest CT 07/08/2019 FINDINGS: CT CHEST FINDINGS Cardiovascular: The heart size is normal. No substantial pericardial effusion. Atherosclerotic calcification is noted in the wall of the thoracic aorta. Mediastinum/Nodes: No mediastinal lymphadenopathy. No evidence for gross hilar lymphadenopathy although assessment is limited by the lack of intravenous contrast on today's study. The esophagus has normal imaging features. There is no axillary lymphadenopathy. Lungs/Pleura: No evidence of bronchiectasis. No substantial bronchial wall thickening.Tiny calcified granuloma in the left lower lobe is stable. No suspicious pulmonary nodule or mass. No focal airspace consolidation. No pleural effusion. Musculoskeletal: Sclerotic lesion in the left scapula is stable since 07/08/2019, likely benign. CT ABDOMEN PELVIS FINDINGS Hepatobiliary: No focal abnormality in the liver on this study without intravenous contrast. Gallbladder is surgically absent. No intrahepatic or extrahepatic biliary dilation. Pancreas: No focal mass lesion. No dilatation of the main duct. No intraparenchymal cyst. No peripancreatic edema. Spleen: No splenomegaly. No focal mass lesion. Adrenals/Urinary Tract: No adrenal nodule or mass. 6 mm subcapsular lesion anterior interpolar left kidney is too small to characterize on this noncontrast CT but is unchanged comparing back to a study from 12/13/2016 and was present on the study from 03/08/2014, most suggestive of benign etiology. Similar 5 mm exophytic lesion extreme lower pole left kidney is also unchanged since 2018. Right kidney unremarkable. No stones are seen in either kidney or ureter. No secondary changes in either kidney or ureter. No bladder stones. Stomach/Bowel: Stomach is unremarkable. No gastric wall thickening. No evidence of outlet obstruction. Duodenum is  normally positioned as is the ligament of Treitz. No small bowel wall thickening. No small bowel dilatation. The terminal ileum is normal. The appendix is normal. No gross colonic mass. No colonic wall thickening. Vascular/Lymphatic: No abdominal aortic aneurysm. There is no gastrohepatic or hepatoduodenal ligament lymphadenopathy. No retroperitoneal or mesenteric lymphadenopathy. No pelvic sidewall lymphadenopathy. Reproductive: The uterus is surgically absent. There is no adnexal mass. Other: No intraperitoneal free fluid. Musculoskeletal: No worrisome lytic or sclerotic osseous abnormality. IMPRESSION: 1. No acute findings in the chest. No findings to explain the patient's history of hemoptysis. 2. No findings to explain the history of hematuria. Tiny lesions in the left kidney are stable since 03/08/2014, consistent with benign etiology. Electronically Signed   By: Misty Stanley M.D.   On: 05/17/2020 16:23   [place] Extrem Up Bilat Comp  Result Date: 06/01/2020 Ultrasound examination of bilateral hands was performed per EULAR recommendations. Using 12 MHz  transducer, grayscale and power Doppler bilateral second, third, and fifth MCP joints and bilateral wrist joints both dorsal and volar aspects were evaluated to look for synovitis or tenosynovitis. The findings were there was  synovitis in left second MCP, bilateral fifth MCPs and bilateral wrist joints on ultrasound examination. Right median nerve was 0.07 cm squares which was within normal limits and left median nerve was 0.09 cm squares which was within normal limits. Impression: Ultrasound examination showed synovitis in bilateral hands and wrist joints.  Bilateral median nerves are within normal limits.  XR Hand 2 View Left  Result Date: 05/16/2020 Mild PIP and DIP narrowing was noted.  No MCP, intercarpal or radiocarpal joint space narrowing was noted.  No radiographic progression was noted when compared to the x-rays of 2020. Impression: These  findings are consistent with mild osteoarthritis of the hand.  XR Hand 2 View Right  Result Date: 05/16/2020 Mild PIP and DIP narrowing was noted.  No MCP, intercarpal or radiocarpal joint space narrowing was noted.  No radiographic progression was noted when compared to the x-rays of 2020. 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   Recent Labs: Lab Results  Component Value Date   WBC 6.7 04/21/2020   HGB 14.2 04/21/2020   PLT 386 04/21/2020   NA 138 04/21/2020   K 4.3 04/21/2020   CL 102 04/21/2020   CO2 25 04/21/2020   GLUCOSE 94 04/21/2020   BUN 10 04/21/2020   CREATININE 0.76 04/21/2020   BILITOT 0.5 08/25/2014   ALKPHOS 77 08/25/2014   AST 28 08/25/2014   ALT 25 08/25/2014   PROT 7.6 08/25/2014   ALBUMIN 4.8 08/25/2014   CALCIUM 9.2 04/21/2020   GFRAA >60 04/21/2020    Speciality Comments: No specialty comments available.  Procedures:  No procedures performed Allergies: Iodinated diagnostic agents, Penicillins, Prednisone, Propoxyphene, Acetaminophen, Benzonatate, Buprenorphine hcl, Citric acid, Clindamycin, Codeine, Diazepam, Diclofenac, Esomeprazole, Morphine and related, Nsaids, Pantoprazole sodium, Ceftin [cefuroxime axetil], Cephalexin, Cortisone, Diltiazem, Doxycycline, Hydrocodone-acetaminophen, Ibuprofen, Lidocaine, Loracarbef, Macrobid [nitrofurantoin monohyd macro], Maxalt [rizatriptan], Methylprednisolone sodium succ, Naproxen, Omeprazole, Oxycodone-acetaminophen, Phenazopyridine hcl, Tomato, and Budesonide   Assessment / Plan:     Visit Diagnoses: Rheumatoid arthritis of multiple sites with negative rheumatoid factor (HCC) - RF negative, anti-CCP negative, 14 3 3  eta positive, positive synovitis on ultrasound examination.  Her bilateral median nerves are within normal limits: She has no synovitis on exam today.  She continues to have pain and tenderness of multiple joints including both wrists and all MCP joints.  Ultrasound  performed on 06/01/2020 revealed active synovitis.  At that visit the use of Plaquenil was discussed but she declined at that time.  Today she presents with increased pain in both hip joints which is likely due to underlying inflammation.  We discussed starting on Plaquenil in detail but she does not want to proceed at this time.  She will notify us if she changes her mind.  She will continue taking natural anti-inflammatories as recommended.  She is planning on starting omega-3 and turmeric.  She will follow-up in the office in 5 months.  Primary osteoarthritis of both hands: She has chronic pain in both hands.  No inflammation was noted on exam.  She has complete fist formation bilaterally.  She has started taking tart cherry and ginger.  She was encouraged to start taking omega 3 and tumeric.  Joint protection and muscle strengthening were discussed.   Myofascial pain: She has generalized hyperalgesia on exam.  She has generalized muscle aches and tenderness.  She continues to have chronic neck and lower back pain as well as intermittent muscle spasms.  We discussed the importance of regular exercise and good sleep hygiene.  Bilateral carpal tunnel syndrome: Nerve conduction study performed at [facility].  She was evaluated by Dr. Erlinda Hong on 05/26/20.  She is planning on proceeding with surgical release, left followed by right.   DDD (degenerative disc disease), cervical: She has painful ROM.  No symptoms of radiculopathy at this time.  DDD (degenerative disc disease), lumbar - Chronic pain.  She has been experiencing progressively worsening lower back pain.  She has been experiencing intermittent left-sided radiculopathy.  She was encouraged to follow up with Dr. Louanne Skye to discuss proceeding with surgery which she has been postponing.   Radiculopathy, lumbar region: Left-sided.  She has been postponing surgery by Dr. Louanne Skye but was encouraged to follow-up with him to discuss her options.  She has been to physical  therapy in the past.  Chronic pain syndrome  Other medical conditions are listed as follows:   Pituitary adenoma ([place])  Chronic venous insufficiency  History of varicose veins  Coronary artery spasm (HCC)  History of ovarian cyst  History of gastroesophageal reflux (GERD)  History of IBS  Orders: No orders of the defined types were placed in this encounter.  No orders of the defined types were placed in this encounter.   Face-to-face time spent with patient was 30 minutes. Greater than 50% of time was spent in counseling and coordination of care.  Follow-Up Instructions: Return in 5 months (on 11/06/2020) for Rheumatoid arthritis, Myofascial pain.   Ofilia Neas, PA-C  Note - This record has been created using Dragon software.  Chart creation errors have been sought, but may not always  have been located. Such creation errors do not reflect on  the standard of medical care.

## 2020-06-14 ENCOUNTER — Ambulatory Visit: Payer: Medicaid Other | Admitting: Specialist

## 2020-06-14 ENCOUNTER — Encounter: Payer: Self-pay | Admitting: Specialist

## 2020-06-14 ENCOUNTER — Other Ambulatory Visit: Payer: Self-pay

## 2020-06-14 ENCOUNTER — Ambulatory Visit (INDEPENDENT_AMBULATORY_CARE_PROVIDER_SITE_OTHER): Payer: Medicaid Other

## 2020-06-14 VITALS — BP 107/74 | HR 69 | Ht 59.75 in | Wt 142.0 lb

## 2020-06-14 DIAGNOSIS — N23 Unspecified renal colic: Secondary | ICD-10-CM | POA: Diagnosis not present

## 2020-06-14 DIAGNOSIS — M545 Low back pain, unspecified: Secondary | ICD-10-CM

## 2020-06-14 DIAGNOSIS — G5602 Carpal tunnel syndrome, left upper limb: Secondary | ICD-10-CM

## 2020-06-14 DIAGNOSIS — G5601 Carpal tunnel syndrome, right upper limb: Secondary | ICD-10-CM | POA: Diagnosis not present

## 2020-06-14 DIAGNOSIS — M25559 Pain in unspecified hip: Secondary | ICD-10-CM

## 2020-06-14 DIAGNOSIS — M5136 Other intervertebral disc degeneration, lumbar region: Secondary | ICD-10-CM

## 2020-06-14 NOTE — Progress Notes (Signed)
Office Visit Note   Patient: Virginia Shaw           Date of Birth: 22-Nov-1965           MRN: [id number] Visit Date: 06/14/2020              Requested by: [name], [place],  [place] [id number] PCP: [name], MD   Assessment & Plan: Visit Diagnoses:  1. Degenerative disc disease, lumbar   2. Left carpal tunnel syndrome   3. Right carpal tunnel syndrome   4. Kidney pain   5. Low back pain without sciatica, unspecified back pain laterality, unspecified chronicity   6. Hip pain      Plan: Avoid bending, stooping and avoid lifting weights greater than 10 lbs. Will order a pool therapy program for lumbar exercises. Avoid frequent bending and stooping  No lifting greater than 10 lbs. May use ice or moist heat for pain. Weight loss is of benefit. Best medication for lumbar disc disease is arthritis medications like motrin, celebrex and naprosyn but you should not take these due to biopsy report indicating chronic kidney disease. Exercise is important to improve your indurance and does allow people to function better inspite of back pain. Consider hernia surgery and the surgery on your hands before considering the surgery.   Follow-Up Instructions: No follow-ups on file.   Orders:  No orders of the defined types were placed   Follow-Up Instructions: Return in about 6 weeks (around 07/26/2020).   Orders:  Orders Placed This Encounter  Procedures  . XR Lumbar Spine 2-3 Views   No orders of the defined types were placed in this encounter.     Procedures: No procedures performed   Clinical Data: No additional findings.   Subjective: Chief Complaint  Patient presents with  . Lower Back - Follow-up    Discuss surgery    54 year old female with back pain and radiation into the lower extremities. She has been seen and evaluated in the past and found to have DDD L4-5 and L5-S1. Has attempte conservative management  withESIs and therapy andhas pain that is on the scale 1-10 it is a 7-8 and may be as high as a 10. Pain is lumbar small of the back with radiation into both leg posteriorly thighs and calves and down both legs. Pain with sitting bending and stooping is painful and it is hard to get up. She has a left inguinal hernia an plans for repair of this and also Dr. Erlinda Hong is planning upper extremeity surgery for tendons in the hands and carpal tunnel syndrome. She had a cystoscope of her bladder and post this has had some increased bladder irritaion with some increase incontinence. She is considering left inguinal hernia repair by Dr. Leonard Downing   Review of Systems  Constitutional: Positive for activity change. Negative for appetite change, chills, diaphoresis, fatigue, fever and unexpected weight change.  HENT: Negative.  Negative for congestion, dental problem, drooling, ear discharge, ear pain, facial swelling, hearing loss, mouth sores, nosebleeds, postnasal drip, rhinorrhea, sinus pressure, sinus pain, sneezing, sore throat, tinnitus, trouble swallowing and voice change.   Eyes: Positive for pain, redness and visual disturbance (dry eye syndrome). Negative for photophobia, discharge and itching.  Respiratory: Positive for shortness of breath (sees a lung specialist, Dr. Tammi Klippel with hemoptysis). Negative for apnea, cough, choking, chest tightness, wheezing and stridor.   Cardiovascular: Positive for palpitations (on occasion, history of angina) and  leg swelling (occasionally). Negative for chest pain.  Gastrointestinal: Positive for abdominal pain (Ovarian cyst, partial then complete hysterectomy 20s, 30's). Negative for abdominal distention, anal bleeding, blood in stool, constipation, diarrhea, nausea, rectal pain and vomiting.  Endocrine: Negative.  Negative for cold intolerance, heat intolerance, polydipsia, polyphagia and polyuria.  Genitourinary: Positive for hematuria (had a kidney biopsy, CKD stage ?).    Musculoskeletal: Positive for back pain. Negative for arthralgias, gait problem, joint swelling, myalgias, neck pain and neck stiffness.  Skin: Negative.   Allergic/Immunologic: Positive for food allergies. Negative for environmental allergies.  Neurological: Positive for weakness (left greater than right leg weakness) and headaches (eye aches and eye pain). Negative for dizziness, tremors, seizures, syncope, facial asymmetry, speech difficulty, light-headedness and numbness (both hands, left leg and the left side of face.).  Hematological: Negative.  Negative for adenopathy. Does not bruise/bleed easily.  Psychiatric/Behavioral: Negative.  Negative for agitation, behavioral problems, confusion, decreased concentration, dysphoric mood, hallucinations, self-injury, sleep disturbance and suicidal ideas. The patient is not nervous/anxious and is not hyperactive.      Objective: Vital Signs: BP 107/74 (BP Location: Left Arm, Patient Position: Sitting)   Pulse 69   Ht 4' 11.75" (1.518 m)   Wt 142 lb (64.4 kg)   BMI 27.97 kg/m   Physical Exam Constitutional:      Appearance: She is well-developed.  HENT:     Head: Normocephalic and atraumatic.  Eyes:     Pupils: Pupils are equal, round, and reactive to light.  Pulmonary:     Effort: Pulmonary effort is normal.     Breath sounds: Normal breath sounds.  Abdominal:     General: Bowel sounds are normal.     Palpations: Abdomen is soft.  Musculoskeletal:        General: Normal range of motion.     Cervical back: Normal range of motion and neck supple.     Lumbar back: Negative right straight leg raise test and negative left straight leg raise test.  Skin:    General: Skin is warm and dry.  Neurological:     Mental Status: She is alert and oriented to person, place, and time.  Psychiatric:        Behavior: Behavior normal.        Thought Content: Thought content normal.        Judgment: Judgment normal.     Back Exam   Tenderness   The patient is experiencing tenderness in the lumbar.  Range of Motion  Extension: normal  Flexion: normal  Lateral bend right: normal  Lateral bend left: normal  Rotation right: normal  Rotation left: normal   Muscle Strength  Right Quadriceps:  5/5  Left Quadriceps:  5/5  Right Hamstrings:  5/5  Left Hamstrings:  5/5   Tests  Straight leg raise right: negative Straight leg raise left: negative  Reflexes  Patellar: 1/4 Achilles: 1/4 Babinski's sign: normal   Other  Erythema: no back redness Scars: absent      Specialty Comments:  No specialty comments available.  Imaging: XR Lumbar Spine 2-3 Views  Result Date: 06/14/2020 AP and lateral flexion and extension radiographs demonstrate DDD L4-5 and L5-S1 with disc narrowing and mild endplate sclerosis, minimal anterior spurs L5-S1.     PMFS History: Patient Active Problem List   Diagnosis Date Noted  . Special screening for malignant neoplasms, colon 03/29/2020  . Dysphagia 12/22/2019  . DDD (degenerative disc disease), cervical 08/11/2018  . DDD (degenerative disc disease),  lumbar 08/11/2018  . Somatic complaints, multiple 03/14/2017  . Chronic pain syndrome 08/25/2014  . Irritable bowel syndrome (IBS) 01/12/2014  . 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 Chronic abdominal pain 01/11/2014  . Chronic venous insufficiency 10/30/2013  . Varicose veins 10/30/2013  . Chest tightness 03/25/2012  . 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 RECTAL BLEEDING 08/26/2008  . UTI'S, RECURRENT 08/26/2008   Past Medical History:  Diagnosis Date  . Abnormal involuntary movements(781.0)   . Allergic rhinitis, cause unspecified   . Anxiety state, unspecified   . Cervical spondylosis without myelopathy   . Disturbance of skin sensation   . GERD (gastroesophageal reflux disease)   . History of chicken pox   . Irritable bowel syndrome   . Low blood pressure   . Lumbago   . Myalgia and myositis, unspecified   .  Osteoporosis   . Other and unspecified angina pectoris   . Other malaise and fatigue   . PONV (postoperative nausea and vomiting)   . 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 Somatization disorder   . Spasm of muscle   . Systemic sclerosis ([place])   . TIA (transient ischemic attack)   . Unspecified hereditary and idiopathic peripheral neuropathy     Family History  Problem Relation Age of Onset  . Hypertension Father   . Hyperlipidemia Father   . Diabetes Father   . Stroke Father   . Cancer Maternal Uncle        lung  . Cancer Paternal Aunt        lung  . Migraines Mother   . Heart attack Paternal Grandmother   . Heart attack Paternal Grandfather   . Hypertension Paternal Grandfather   . Migraines Sister     Past Surgical History:  Procedure Laterality Date  . ABDOMINAL HYSTERECTOMY  1997  . arnold chiari malformation  2013  . BACK SURGERY  2001, 2011,2013   Lumbar x 2  . BALLOON DILATION N/A 12/22/2019   Procedure: BALLOON DILATION;  Surgeon: Wilford Corner, MD;  Location: [facility];  Service: Endoscopy;  Laterality: N/A;  . barium swallow test  10/2019  . BLADDER SURGERY  2011  . CARDIAC CATHETERIZATION    . CATARACT EXTRACTION    . CERVICAL SPINE SURGERY  2013  . CHOLECYSTECTOMY    . COLONOSCOPY  2009   "Normal" Dr Sharlett Iles  . COLONOSCOPY  2002   "Normal" Dr Freddrick March  . COLONOSCOPY  ~2013   Dr Ferdinand Lango.  "Chest pain with the prep" = aborted  . COLONOSCOPY WITH PROPOFOL N/A 03/29/2020   Procedure: COLONOSCOPY WITH PROPOFOL;  Surgeon: Wilford Corner, MD;  Location: [facility];  Service: Endoscopy;  Laterality: N/A;  . CRANIECTOMY SUBOCCIPITAL W/ CERVICAL LAMINECTOMY / CHIARI  2012   Dr Annette Stable, Chiari decompression with suboccipital craniectomy and  . CYSTOCELE REPAIR  2002   Dr Gaetano Net  . ESOPHAGOGASTRODUODENOSCOPY (EGD) WITH PROPOFOL N/A 12/22/2019   Procedure: ESOPHAGOGASTRODUODENOSCOPY (EGD) WITH PROPOFOL;  Surgeon: Wilford Corner, MD;  Location: [facility]  ENDOS[facility];  Service: Endoscopy;  Laterality: N/A;  . [place], 2000  . PITUITARY EXCISION  2010   pituitary cyst removed  . RECTOCELE REPAIR  2002   Dr. Gaetano Net  . RENAL BIOPSY  08/04/2019  . tear duct tubes  2005  . TUBAL LIGATION     Social History   Occupational History    Employer: UNEMPLOYED  Tobacco Use  . Smoking status: Never Smoker  . Smokeless tobacco: Never Used  Vaping Use  .  Vaping Use: Never used  Substance and Sexual Activity  . Alcohol use: No  . Drug use: No  . Sexual activity: Never    Birth control/protection: Abstinence

## 2020-06-14 NOTE — Patient Instructions (Signed)
  Plan: Avoid bending, stooping and avoid lifting weights greater than 10 lbs. Will order a pool therapy program for lumbar exercises. Avoid frequent bending and stooping  No lifting greater than 10 lbs. May use ice or moist heat for pain. Weight loss is of benefit. Best medication for lumbar disc disease is arthritis medications like motrin, celebrex and naprosyn but you should not take these due to biopsy report indicating chronic kidney disease. Exercise is important to improve your indurance and does allow people to function better inspite of back pain.    Follow-Up Instructions: No follow-ups on file.   Orders:  No orders of the defined types were placed

## 2020-06-28 ENCOUNTER — Encounter: Payer: Self-pay | Admitting: Pulmonary Disease

## 2020-06-28 ENCOUNTER — Ambulatory Visit: Payer: Medicaid Other | Admitting: Pulmonary Disease

## 2020-06-28 ENCOUNTER — Other Ambulatory Visit: Payer: Self-pay

## 2020-06-28 VITALS — BP 108/66 | HR 79 | Temp 97.5°F | Ht 60.0 in | Wt 141.6 lb

## 2020-06-28 DIAGNOSIS — R042 Hemoptysis: Secondary | ICD-10-CM

## 2020-06-28 NOTE — Patient Instructions (Signed)
As we discussed in clinic today I would recommend a bronchoscope for evaluation of your intermittent symptoms of blood in the sputum.  Per your request we would defer this procedure until next year Follow-up in April 2022

## 2020-06-28 NOTE — Progress Notes (Signed)
Virginia Shaw    [id number]    [birth year]  Primary Care Physician:Smith, Hal Hope, MD  Referring Physician: [name], [place],  [place] [address]  Chief complaint: Follow up for blood in sputum  HPI: 54 year old with history of angina, osteoarthritis, endometriosis, IBS, chronic neck and back pain sent here for evaluation of hemoptysis.  She has had intermittent streaks of blood in the sputum for the past year.  She had an episode of blood with clots in the sputum around May of this year.  Also noted to have proteinuria and hematuria.  Follows with urology at [facility].  She has been evaluated by Dr. Harrie Jeans, nephrology.  Work-up as noted below with elevated anti-proteinase 3 antibody.    She has not had any more episodes of blood in sputum since May.  Blood is not associated with cough.  Denies any dyspnea, sputum production, fevers, chills.  History noted for sinusitis.  She has had nasal surgery in the past.  Also has intermittent episodes of GERD.  She has chronic joint pain which is thought to be secondary to osteoarthritis and degenerative disc disease.  Follows with Dr. Estanislado Pandy.  She does not take NSAIDs due to side effects.  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  She is not on any particular treatment from her nephrologist and is being monitored.  Pets: Has a dog.  No cats, birds, farm animals Occupation: Used to work at a [employer] until 2007.  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  No current ongoing exposures.  Denies any mold, hot tub, Jacuzzi, humidifier Smoking history: Never smoker Travel history: No significant travel history Relevant family history: There is significant family history of lung disease.  Her aunt had a lung transplant.  She is not clear why the transplant was  needed.. Several family members have lung cancer, mesothelioma, COPD.  Interim history: Continues to have minor hemoptysis about once every 8 months. She has GERD for which she is taking PPI with reasonable control of symptoms. No dyspnea, cough.  Outpatient Encounter Medications as of 06/28/2020  Medication Sig  . amLODipine (NORVASC) 5 MG tablet Take 5 mg by mouth daily. Take 1 tablet (5 mg) by mouth in the morning  . aspirin EC 81 MG tablet Take 81 mg by mouth daily as needed (chest tightness/pain.).  Marland Kitchen isosorbide mononitrate (IMDUR) 30 MG 24 hr tablet Take 30 mg by mouth daily.  . metoprolol succinate (TOPROL-XL) 25 MG 24 hr tablet Take 12.5 mg by mouth daily.  . Misc Natural Products (TART CHERRY ADVANCED) CAPS Take 1 capsule by mouth daily. 1200 mg  . nitroGLYCERIN (NITROSTAT) 0.4 MG SL tablet Place 0.4 mg under the tongue every 5 (five) minutes x 3 doses as needed for chest pain.   Marland Kitchen ondansetron (ZOFRAN) 4 MG tablet Take 4 mg by mouth daily.  Vladimir Faster Glycol-Propyl Glycol (SYSTANE) 0.4-0.3 % SOLN Place 1 drop into both eyes 3 (three) times daily as needed (dry eyes).   No facility-administered encounter medications on file as of 06/28/2020.   Physical Exam: Blood pressure 128/74, pulse 81, temperature 98.4 F (36.9 C), temperature source Temporal, height 4\' 11"  (1.499 m), weight 141 lb 9.6 oz (64.2 kg), SpO2 97 %. Gen:      No acute distress HEENT:  EOMI, sclera anicteric Neck:     No masses; no thyromegaly Lungs:    Clear to auscultation bilaterally; normal respiratory effort CV:         Regular rate and rhythm; no murmurs Abd:      + bowel sounds; soft, non-tender; no palpable masses, no distension Ext:    No edema; adequate peripheral perfusion Skin:      Warm and dry; no rash Neuro: alert and oriented x 3 Psych: normal mood and affect  Data Reviewed: Imaging: Chest x-ray 07/24/2018- no active cardiopulmonary disease. CT abdomen pelvis 03/01/2019- no acute abnormality  in lower chest. High-resolution CT 07/08/2019- no interstitial lung disease.  Minimal air trapping.  Aortic atherosclerosis.   CT chest 05/17/2020-no acute lung findings.  Tiny lesion in left kidney. I have reviewed the images personally.  Labs: From nephrology office 05/05/2019- Anti-proteinase 3 antibody-8.8 [0-3.5) MPO ab, c-ANCA, p-ANCA-negative Anti-GBM antibody-6 units C3-C4 complement-within normal limits ANA- negative  Metabolic panel 3/56/7014- BUN 17, creatinine 0.85 CBC--WBC 7.9, hemoglobin 14.3, platelets 379, eos 15%, absolute eosinophil count 1185 UA 05/05/2019-1+ occult blood, negative protein  Pathology: Kidney biopsy 08/04/2019-glomerulopathy with segmental GBM thinning.  Assessment:  Evaluation for blood in sputum Unclear if this is actually hemoptysis.  She has been worked up for hematuria with anti-PR-3 antibody. Kidney biopsy with no evidence of vasculitis.  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 There is also question of GERD with esophageal stricture as she is seeing a gastroenterologist for dilatation.  Repeated CT chest with no lung abnormality Given intermittent symptoms I have recommended a bronchoscope for airway evaluation  She is scheduled for hernia repair and carpal tunnel repair in the near future and would like to defer this procedure for 8 months. Discussed risk of possibly missing a diagnosis of airway lesion.  She has made the informed decision to wait  Reassess in clinic in April 2022  Plan/Recommendations: -Follow-up in 8 months.  Marshell Garfinkel MD [facility] 06/28/2020, 11:07 AM  CC: [name], MD

## 2020-07-20 ENCOUNTER — Ambulatory Visit: Payer: Self-pay | Admitting: General Surgery

## 2020-07-20 NOTE — H&P (Signed)
History of Present Illness Ralene Ok MD; 07/20/2020 9:56 AM) The patient is a 54 year old female who presents with an inguinal hernia. Patient is a 54 year old female who fallback today secondary to continued left inguinal/thigh pain. Patient was seen approximately 3 years ago. Patient states that the pain has increased in severity and frequency. She does also have back issues and back pain. She's had 3 previous back surgeries.  Patient states that she does not notice a bulge of the hernia in her groin at this time. Patient undergo CT scan in July of this year. I did review this personally. This does appear to show bilateral inguinal hernias, fat-containing.  Patient does appear to have some tendinitis in her wrist. Patient is scheduled to undergo surgery with Dr. Erlinda Hong  Patient sees Dr. Shea Evans nephrologist.   --------------------------------------------- Update: Patient comes back in secondary to continue left inguinal pain. She states it does continue to radiate down her left inguinal area, and left thigh. Surgery is planned for bilateral open inguinal hernia repair with mesh on 12/26. There've been no other further changes.  __________________________________________________ Referred by: Dr. Gertie Fey Chief Complaint: Left inguinal hernia  Patient is a 54 year old female with a history of TIAs, angina, and a left inguinal hernia. Patient states she's had pain to her left inguinal area for several months. She states that the pain radiates the lower medial thigh. She states this sometimes bilaterally. Patient underwent ultrasound Dr. Marcello Moores office which revealed a possible hernia. Patient states that it With Valsalva, coughing, sneezing, bowel movements caused the pain to be provoked. She states avoiding the sometimes causes the pain to be released. There are no other modifying factors.    Allergies (Chanel Teressa Senter, CMA; 07/20/2020 9:26 AM) Iodinated Diagnostic Agents   Protonix *ULCER DRUGS*  Acetaminophen *ANALGESICS - NonNarcotic*  Benzonatate *COUGH/COLD/ALLERGY*  Budesonide *ANTIASTHMATIC AND BRONCHODILATOR AGENTS*  Buprenorphine *ANALGESICS - OPIOID*  Ceftin *CEPHALOSPORINS*  Cefuroxime Axetil *CEPHALOSPORINS*  Cephalexin *CEPHALOSPORINS*  Citric Acid *CHEMICALS*  Clindamycin HCl *ANTI-INFECTIVE AGENTS - MISC.*  Codeine Phosphate *ANALGESICS - OPIOID*  Cortisone *CORTICOSTEROIDS*  DiazePAM *ANTIANXIETY AGENTS*  Diclofenac *ANALGESICS - ANTI-INFLAMMATORY*  DilTIAZem CD *CALCIUM CHANNEL BLOCKERS*  Doxycycline *DERMATOLOGICALS*  Esomeprazole Magnesium *ULCER DRUGS*  Hydrocodone-Acetaminophen *ANALGESICS - OPIOID*  Ibuprofen *ANALGESICS - ANTI-INFLAMMATORY*  Macrobid *URINARY ANTI-INFECTIVES*  Maxalt *MIGRAINE PRODUCTS*  Morphine Sulfate (Concentrate) *ANALGESICS - OPIOID*  Naproxen *ANALGESICS - ANTI-INFLAMMATORY*  NSAIDs  Omeprazole *CHEMICALS*  OxyCODONE HCl *ANALGESICS - OPIOID*  Penicillins  Phenazopyridine HCl *GENITOURINARY AGENTS - MISCELLANEOUS*  PredniSONE *CORTICOSTEROIDS*  Tomato (Diagnostic) *DIAGNOSTIC PRODUCTS*   Medication History (Chanel Nolan, CMA; 07/20/2020 9:27 AM) Amlodipine-Atorvastatin (5-10MG  Tablet, Oral) Active. Aspirin (81MG  Tablet Chewable, Oral) Active. Isosorbide Mononitrate (20MG  Tablet, Oral) Active. Nitroglycerin (0.4MG  Tab Sublingual, Sublingual) Active. Metoprolol Succinate ER (25MG  Tablet ER 24HR, Oral) Active. Medications Reconciled    Review of Systems Ralene Ok, MD; 07/20/2020 9:58 AM) General Present- Weight Gain. Not Present- Appetite Loss, Chills, Fatigue, Fever, Night Sweats and Weight Loss. Skin Present- Non-Healing Wounds. Not Present- Change in Wart/Mole, Dryness, Hives, Jaundice, New Lesions, Rash and Ulcer. HEENT Present- Earache, Hoarseness, Seasonal Allergies, Sinus Pain, Sore Throat, Visual Disturbances and Wears glasses/contact lenses. Not  Present- Hearing Loss, Nose Bleed, Oral Ulcers, Ringing in the Ears and Yellow Eyes. Respiratory Present- Difficulty Breathing. Not Present- Bloody sputum, Chronic Cough, Snoring and Wheezing. Breast Present- Breast Mass, Breast Pain and Nipple Discharge. Not Present- Skin Changes. Cardiovascular Present- Chest Pain, Difficulty Breathing Lying Down, Leg Cramps, Palpitations, Shortness of Breath and Swelling of Extremities.  Not Present- Rapid Heart Rate. Gastrointestinal Present- Abdominal Pain, Bloating, Change in Bowel Habits, Nausea and Rectal Pain. Not Present- Bloody Stool, Chronic diarrhea, Constipation, Difficulty Swallowing, Excessive gas, Gets full quickly at meals, Hemorrhoids, Indigestion and Vomiting. Female Genitourinary Present- Frequency, Nocturia, Painful Urination, Pelvic Pain and Urgency. Musculoskeletal Present- Back Pain, Joint Pain, Joint Stiffness, Muscle Pain, Muscle Weakness and Swelling of Extremities. Neurological Present- Headaches, Numbness, Tingling, Trouble walking and Weakness. Not Present- Decreased Memory, Fainting, Seizures and Tremor. Psychiatric Present- Change in Sleep Pattern. Not Present- Anxiety, Bipolar, Depression, Fearful and Frequent crying. Endocrine Present- Cold Intolerance. Not Present- Excessive Hunger, Hair Changes, Heat Intolerance, Hot flashes and New Diabetes. Hematology Present- Blood Thinners, Easy Bruising and Gland problems. Not Present- Excessive bleeding, HIV and Persistent Infections. All other systems negative  Vitals (Chanel Nolan CMA; 07/20/2020 9:28 AM) 07/20/2020 9:28 AM Weight: 143 lb Height: 60in Body Surface Area: 1.62 m Body Mass Index: 27.93 kg/m  Temp.: 97.30F  Pulse: 96 (Regular)  BP: 110/72(Sitting, Left Arm, Standard)       Physical Exam Ralene Ok MD; 07/20/2020 9:57 AM) The physical exam findings are as follows: Note: Constitutional: No acute distress, conversant, appears stated age  Eyes:  Anicteric sclerae, moist conjunctiva, no lid lag  Neck: No thyromegaly, trachea midline, no cervical lymphadenopathy  Lungs: Clear to auscultation biilaterally, normal respiratory effot  Cardiovascular: regular rate & rhythm, no murmurs, no peripheal edema, pedal pulses 2+  GI: Soft, no masses or hepatosplenomegaly, non-tender to palpation  MSK: Normal gait, no clubbing cyanosis, edema  Skin: No rashes, palpation reveals normal skin turgor  Psychiatric: Appropriate judgment and insight, oriented to person, place, and time  Abdomen Inspection Hernias - Inguinal hernia - Bilateral - Reducible - Bilateral (Small) .    Assessment & Plan Ralene Ok MD; 07/20/2020 9:57 AM) BILATERAL INGUINAL HERNIA WITHOUT OBSTRUCTION OR GANGRENE, RECURRENCE NOT SPECIFIED (K40.20) Impression: 54 year old female with symptomatic left and right inguinal hernia. Patient has a history of TIAs as well as angina. We will get clearance by Dr. Durene Romans and Dr. Leroy Sea. These are her cardiologist and neurologist respectively. Patient sees Dr. Shea Evans her nephrologist. We will get clearance from her cardiologist and nephrologist at this point.  1. The patient will like to proceed to the operating room for robotic bilateral inguinal hernia repair with mesh.  2. I discussed with the patient the signs and symptoms of incarceration and strangulation and the need to proceed to the ER should they occur.  3. I discussed with the patient the risks and benefits of the procedure to include but not limited to: Infection, bleeding, damage to surrounding structures, possible need for further surgery, possible nerve pain, and possible recurrence. The patient was understanding and wishes to proceed.

## 2020-07-27 ENCOUNTER — Ambulatory Visit: Payer: Medicaid Other | Admitting: Specialist

## 2020-08-04 ENCOUNTER — Telehealth: Payer: Self-pay

## 2020-08-04 NOTE — Telephone Encounter (Signed)
Abby would like a call back to confirm date.  Mentioned Dr. Rosendo Gros.  Cb# [phone].  Please advise.  Thank you.

## 2020-08-04 NOTE — Telephone Encounter (Signed)
What are you asking.

## 2020-08-05 NOTE — Telephone Encounter (Signed)
She just stated that she needed to speak with Dr. Phoebe Sharps nurse to confirm a date. No sure what it is concerning.

## 2020-08-05 NOTE — Telephone Encounter (Signed)
Called back to see what was needed. States Xu's Case on for the [facility] Room 10. But didn't know for what body part.

## 2020-08-05 NOTE — Telephone Encounter (Signed)
I am very confused about what is going on with these messages.

## 2020-08-08 NOTE — Telephone Encounter (Signed)
Abby and I are coordinating patient's hand surgery with Dr. Erlinda Hong and hernia surgery with Dr. Rosendo Gros to be done at same time.  Not sure why she didn't call me back directly.  I will return the call.

## 2020-08-08 NOTE — Telephone Encounter (Signed)
Thanks

## 2020-08-31 NOTE — Telephone Encounter (Signed)
I called Abby and confirmed.

## 2020-09-15 ENCOUNTER — Ambulatory Visit: Payer: Medicaid Other | Admitting: Specialist

## 2020-09-26 NOTE — Progress Notes (Signed)
[id number] [address] [place] [address] Phone: [phone] Fax: [phone]  [id number] - [place], [address] AT [place] [address] [place] [address] Phone: [phone] Fax: [facility] [place], [address] AT [place] [address] Phone: [phone] Fax: [phone]      Your procedure is scheduled on Tuesday 10/04/2020.  Report to [facility] Main Entrance "A" at 05:30 A.M., and check in at the Admitting office.  Call this number if you have problems the morning of surgery:  [phone]  Call [phone] if you have any questions prior to your surgery date Monday-Friday 8am-4pm    Remember:  Do not eat after midnight the night before your surgery  You may drink clear liquids until 04:30am the morning of your surgery.   Clear liquids allowed are: Water, Non-Citrus Juices (without pulp), Carbonated Beverages, Clear Tea, Black Coffee Only, and Gatorade    Take these medicines the morning of surgery with A SIP OF: Amlodipine (Norvasc) Isosorbide Mononitrate (Imdur) Metoprolol succinate (Toprol XL) Polyethyl Glycol-Propyl Glycol (systane) eye drops  If NEEDED you may take the following medications the morning of surgery:  Nitroglycerin (Nitrostat)   As of today, STOP taking any Aspirin (unless otherwise instructed by your surgeon) Aleve, Naproxen, Ibuprofen, Motrin, Advil, Goody's, BC's, all herbal medications, fish oil, and all vitamins, including Cinnamon and Tart Cherry supplements                      Do not wear jewelry, make up, or nail polish            Do not wear lotions, powders, perfumes/colognes, or deodorant.            Do not shave 48 hours prior to surgery.  Men may shave face and neck.            Do not bring valuables to  the hospital.            [facility] is not responsible for any belongings or valuables.  Do NOT Smoke (Tobacco/Vaping) or drink Alcohol 24 hours prior to your procedure  If you use a CPAP at night, you may bring all equipment for your overnight stay.   Contacts, glasses, dentures or bridgework may not be worn into surgery.      For patients admitted to the hospital, discharge time will be determined by your treatment team.   Patients discharged the day of surgery will not be allowed to drive home, and someone needs to stay with them for 24 hours.    Special instructions:   [facility]- Preparing For Surgery  Before surgery, you can play an important role. Because skin is not sterile, your skin needs to be as free of germs as possible. You can reduce the number of germs on your skin by washing with CHG (chlorahexidine gluconate) Soap before surgery.  CHG is an antiseptic cleaner which kills germs and bonds with the skin to continue killing germs even after washing.    Oral Hygiene is also important to reduce your risk of infection.  Remember - BRUSH YOUR TEETH THE MORNING OF SURGERY WITH YOUR REGULAR TOOTHPASTE  Please do not use if you have an allergy to CHG or antibacterial soaps. If your skin becomes reddened/irritated  stop using the CHG.  Do not shave (including legs and underarms) for at least 48 hours prior to first CHG shower. It is OK to shave your face.  Please follow these instructions carefully.   1. Shower the NIGHT BEFORE SURGERY and the MORNING OF SURGERY with CHG Soap.   2. If you chose to wash your hair, wash your hair first as usual with your normal shampoo.  3. After you shampoo, rinse your hair and body thoroughly to remove the shampoo.  4. Use CHG as you would any other liquid soap. You can apply CHG directly to the skin and wash gently with a scrungie or a clean washcloth.   5. Apply the CHG Soap to your body ONLY FROM THE NECK DOWN.  Do not use on open wounds  or open sores. Avoid contact with your eyes, ears, mouth and genitals (private parts). Wash Face and genitals (private parts)  with your normal soap.   6. Wash thoroughly, paying special attention to the area where your surgery will be performed.  7. Thoroughly rinse your body with warm water from the neck down.  8. DO NOT shower/wash with your normal soap after using and rinsing off the CHG Soap.  9. Pat yourself dry with a CLEAN TOWEL.  10. Wear CLEAN PAJAMAS to bed the night before surgery  11. Place CLEAN SHEETS on your bed the night of your first shower and DO NOT SLEEP WITH PETS.   Day of Surgery: Shower with CHG soap as directed Wear Clean/Comfortable clothing the morning of surgery Do not apply any deodorants/lotions.   Remember to brush your teeth WITH YOUR REGULAR TOOTHPASTE.   Please read over the following fact sheets that you were given.

## 2020-09-27 ENCOUNTER — Other Ambulatory Visit: Payer: Self-pay

## 2020-09-27 ENCOUNTER — Other Ambulatory Visit (HOSPITAL_COMMUNITY): Payer: Medicaid Other

## 2020-09-27 ENCOUNTER — Encounter (HOSPITAL_COMMUNITY)
Admission: RE | Admit: 2020-09-27 | Discharge: 2020-09-27 | Disposition: A | Discharge status: Home / Self Care | Payer: Medicaid Other | Source: Ambulatory Visit | Attending: General Surgery | Admitting: General Surgery

## 2020-09-27 ENCOUNTER — Encounter (HOSPITAL_COMMUNITY): Payer: Self-pay

## 2020-09-27 DIAGNOSIS — G629 Polyneuropathy, unspecified: Secondary | ICD-10-CM | POA: Insufficient documentation

## 2020-09-27 DIAGNOSIS — G935 Compression of brain: Secondary | ICD-10-CM | POA: Insufficient documentation

## 2020-09-27 DIAGNOSIS — I959 Hypotension, unspecified: Secondary | ICD-10-CM | POA: Diagnosis not present

## 2020-09-27 DIAGNOSIS — I201 Angina pectoris with documented spasm: Secondary | ICD-10-CM | POA: Insufficient documentation

## 2020-09-27 DIAGNOSIS — I739 Peripheral vascular disease, unspecified: Secondary | ICD-10-CM | POA: Insufficient documentation

## 2020-09-27 DIAGNOSIS — Z981 Arthrodesis status: Secondary | ICD-10-CM | POA: Diagnosis not present

## 2020-09-27 DIAGNOSIS — Z01812 Encounter for preprocedural laboratory examination: Secondary | ICD-10-CM | POA: Insufficient documentation

## 2020-09-27 DIAGNOSIS — K760 Fatty (change of) liver, not elsewhere classified: Secondary | ICD-10-CM | POA: Insufficient documentation

## 2020-09-27 DIAGNOSIS — K4020 Bilateral inguinal hernia, without obstruction or gangrene, not specified as recurrent: Secondary | ICD-10-CM | POA: Insufficient documentation

## 2020-09-27 DIAGNOSIS — Z7982 Long term (current) use of aspirin: Secondary | ICD-10-CM | POA: Diagnosis not present

## 2020-09-27 DIAGNOSIS — Z79899 Other long term (current) drug therapy: Secondary | ICD-10-CM | POA: Diagnosis not present

## 2020-09-27 DIAGNOSIS — K589 Irritable bowel syndrome without diarrhea: Secondary | ICD-10-CM | POA: Diagnosis not present

## 2020-09-27 DIAGNOSIS — K219 Gastro-esophageal reflux disease without esophagitis: Secondary | ICD-10-CM | POA: Insufficient documentation

## 2020-09-27 DIAGNOSIS — M349 Systemic sclerosis, unspecified: Secondary | ICD-10-CM | POA: Diagnosis not present

## 2020-09-27 DIAGNOSIS — Z8673 Personal history of transient ischemic attack (TIA), and cerebral infarction without residual deficits: Secondary | ICD-10-CM | POA: Insufficient documentation

## 2020-09-27 HISTORY — DX: Fatty (change of) liver, not elsewhere classified: K76.0

## 2020-09-27 HISTORY — DX: Headache, unspecified: R51.9

## 2020-09-27 HISTORY — DX: Other chronic pain: G89.29

## 2020-09-27 HISTORY — DX: Shortness of breath: R06.02

## 2020-09-27 HISTORY — DX: Peripheral vascular disease, unspecified: I73.9

## 2020-09-27 LAB — CBC
HCT: 43.5 % (ref 36.0–46.0)
Hemoglobin: 14.2 g/dL (ref 12.0–15.0)
MCH: 30.3 pg (ref 26.0–34.0)
MCHC: 32.6 g/dL (ref 30.0–36.0)
MCV: 92.8 fL (ref 80.0–100.0)
Platelets: 393 10*3/uL (ref 150–400)
RBC: 4.69 MIL/uL (ref 3.87–5.11)
RDW: 12.2 % (ref 11.5–15.5)
WBC: 6.8 10*3/uL (ref 4.0–10.5)
nRBC: 0 % (ref 0.0–0.2)

## 2020-09-27 LAB — COMPREHENSIVE METABOLIC PANEL WITH GFR
ALT: 22 U/L (ref 0–44)
AST: 24 U/L (ref 15–41)
Albumin: 4 g/dL (ref 3.5–5.0)
Alkaline Phosphatase: 64 U/L (ref 38–126)
Anion gap: 9 (ref 5–15)
BUN: 12 mg/dL (ref 6–20)
CO2: 27 mmol/L (ref 22–32)
Calcium: 9.3 mg/dL (ref 8.9–10.3)
Chloride: 105 mmol/L (ref 98–111)
Creatinine, Ser: 0.78 mg/dL (ref 0.44–1.00)
GFR, Estimated: >60 mL/min
Glucose, Bld: 103 mg/dL — ABNORMAL HIGH (ref 70–99)
Potassium: 4.8 mmol/L (ref 3.5–5.1)
Sodium: 141 mmol/L (ref 135–145)
Total Bilirubin: 0.6 mg/dL (ref 0.3–1.2)
Total Protein: 7 g/dL (ref 6.5–8.1)

## 2020-09-27 NOTE — Progress Notes (Addendum)
PCP - Dr. [name] Cardiologist - Dr. Mahala Menghini Nephrologist - Dr. Harrie Jeans Neurologist - Dr. Laurena Slimmer  PPM/ICD - denies  Chest x-ray - N/A EKG - 04/21/2020 Stress Test - 2011 ECHO -  2011 Cardiac Cath - 2012  Sleep Study - denies CPAP - N/A  DM: denies  Blood Thinner Instructions: N/A Aspirin Instructions: patient stated takes it as needed in conjunction with nitrostat for an angina attack  ERAS Protcol - Yes PRE-SURGERY Ensure or G2- Ensure given  COVID TEST- Scheduled for 09/30/2020. Patient verbalized understanding of self-quarantine instructions, appointment time and place.  Anesthesia review: YES, cardiac history Per patient takes nitrostat on average once every two months. Patient stated last taken about a month and a half ago. Patient also stated sometimes with an angina attack she has left-sided weakness, left side of face tingling and goes numb, so she sometimes take aspirin.  Per patient, "Dr. Rosendo Gros has already touch basis with the cardiologist and nephrologist" for surgery.  Patient denies shortness of breath, fever, cough and chest pain at PAT appointment  All instructions explained to the patient, with a verbal understanding of the material. Patient agrees to go over the instructions while at home for a better understanding. Patient also instructed to self quarantine after being tested for COVID-19. The opportunity to ask questions was provided.

## 2020-09-28 NOTE — Progress Notes (Addendum)
Anesthesia Chart Review:  Case: [id number] Date/Time: 10/04/20 0715   Procedure: XI ROBOTIC ASSISTED BILATERAL INGUINAL HERNIA REPAIR WITH MESH (Bilateral )   Anesthesia type: General   Pre-op diagnosis: BILATERAL INGUINAL HERNIA   Location: [facility] OR ROOM 10 / [facility]   Surgeons: Ralene Ok, MD       DISCUSSION:Patient is a 54 year old female scheduled for the above procedure.   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   Patient with history of chest pain/prinzmetal's angina dating back to at least 2012 with prior normal cardiac cath in [place].  She takes Toprol, Imdur, Norvasc (brand only) routinely with ASA/Nitro as needed (which can be every few months). Last evaluation with Dr. Minna Merritts on 04/05/20. He wrote, "History of atypical chest pain: Negative cardiac cath for coronary artery disease. Possibility she has microvascular disease. Advise continue with her Norvasc 5 mg daily, isosorbide ER 30 mg daily and also metoprolol succinate 25 mg daily." 8 months follow-up. Normal stress echo 06/2018. Dr. Rosendo Gros did request preoperative cardiology input from Dr. Minna Merritts, "Patient is cleared from a cardiac standpoint for surgery."  Presurgical COVID-19 test is scheduled for 09/30/2020.  Anesthesia team to evaluate on the day of surgery.  ADDENDUM 10/03/20 1:02 PM: Patient contacted PAT RN today to report her daughter who lives with her has a sore throat without fever or "any other symptoms of Covid". Patient  reportedly already notified Dr. Rosendo Gros' staff with plans to keep case as scheduled for now. Patient had a negative COVID-19 test on 09/30/20. Discussed with department assistant director and will defer to anesthesiologist if follow-up testing felt desired.  Also she said she used Nitro x1 on 09/30/20 which is not unusual for her--as above, she has long term history of chest pain/prinzmetal's angina for which she is on Toprol, Imdur, Norvasc and as needed ASA/Nitro which she takes about every two months. Cardiology notes indicated work-up has included a normal cath, and her last normal stress test was in 2019. She was previously cleared by her cardiologist Dr. Minna Merritts. Anesthesia team to evaluate on the day of surgery.   VS: BP 114/69   Pulse 66   Temp 36.8 C (Oral)   Resp 18   Ht 5' (1.524 m)   Wt 63.5 kg   SpO2 100%   BMI 27.34 kg/m    PROVIDERS: [name], MD is PCP  - Mahala Menghini, MD is cardiologist ([place]) - Harrie Jeans, MD is nephrologist - Laurena Slimmer, MD is neurologist. ([place]). Last visit 12/16/19 for migraine and intermittent N/T follow-up. Negative brain MRI and no significant carotid artery disease in August 2020. Consider acephalgic migraine.  - Last vascular surgery evaluation see was with Jacelyn Grip, PA on 12/28/14 with [facility] for LLE pain with reported history of venous insufficiency. Exam showed palpable distal pulses and "No evidence of venous insufficiency". She was advised to see neurology for neurological causes of leg pain.    LABS: Labs reviewed: Acceptable for surgery. (all labs ordered are listed, but only abnormal results are displayed)  Labs Reviewed  COMPREHENSIVE METABOLIC PANEL - Abnormal; Notable for the following components:  Result Value   Glucose, Bld 103 (*)    All other components within normal limits  CBC     IMAGES: CT Chest/abd/pelvis 05/17/20: IMPRESSION: 1. No acute findings in the chest. No  findings to explain the patient's history of hemoptysis. 2. No findings to explain the history of hematuria. Tiny lesions in the left kidney are stable since 03/08/2014, consistent with benign etiology.  CXR 04/21/20: FINDINGS: The heart size and mediastinal contours are within normal limits. No focal airspace consolidation, pleural effusion, or pneumothorax. Lower cervical ACDF. IMPRESSION: No active cardiopulmonary disease.   EKG: 04/21/20: Normal sinus rhythm Low voltage QRS Borderline ECG No significant change since last tracing Confirmed by Gareth Morgan [phone]) on 04/21/2020 2:34:39 PM   CV: US Carotid 07/01/19 ([facility]): Conclusion:  Right:  Less than 50% stenosis.  was noted in the right ICA.  The gray scale imaging demonstrates no  plaque in the right carotid bulb and ICA.  Vertebral flow was antegrade.  Left: Less than 50% stenosis.  was noted in the left ICA.  The gray scale imaging demonstrates no  plaque in the left carotid bulb and ICA.  Vertebral flow was antegrade.    Stress Echo 06/30/18 [facility]): Summary  Normal Stress Echocardiogram with no definite ischemia suggested  Normal resting left ventricular function, with no obvious resting segmental  abnormality.  Normal hypercontractile response throughout, with no obvious induced wall  motion abnormality, but the inferior wall is not well seen  Appropriate hemodynamic response to exercise.    48 Holter monitor 02/18/17 [facility]): Holter monitor result :  Indication: Palpitation.  Underline  rhythm sinus  with sinus arrhythmia .Minium heart rate 49 bpm at 5.30 am, maximum heart rate 141 bpm at 11.30 am and average heart rate 85 bpm.  Few isolated premature ventricular contraction.  Patient reported symptoms such as chest pain,fluttering however no correlation .  Impression:  48 Holter monitor consist with sinus rhythm  and episode of sinus tachycardia when she is wake and sinus bradycardia while sleep.  Few isolated premature ventricular contraction. Overall normal Holter monitor.     Per cardiology consult from 05/21/13 [facility] [place], [place], [place]), "Cardiac cath performed in [place] revealed normal appearing coronary arteries with spasm upon catheter engagement."   Isuprel Challenge 05/03/11:  IMPRESSION:  Negative Isuprel challenging study for coronary spasm.  RECOMMENDATIONS: Discussed with the patient that it seems this is coming from her acid reflux and needs to see a GI doctor, also continue her Norvasc and follow up as outpatient.    Past Medical History:  Diagnosis Date  . Abnormal involuntary movements(781.0)   . Allergic rhinitis, cause unspecified   . Anxiety state, unspecified   . Cervical spondylosis without myelopathy   . Chronic headaches   . Disturbance of skin sensation   . Fatty liver    per patient, "not sure but does remember being told"  . GERD (gastroesophageal reflux disease)   . History of chicken pox   . Irritable bowel syndrome   . Low blood pressure   . Lumbago   . Myalgia and myositis, unspecified   . Osteoporosis   . Other and unspecified angina pectoris   . Other malaise and fatigue   . Peripheral vascular disease ([place])    per patient "diagnosed with it about 13-14 years ago and does not currently see someone for it, saw Dr. Vista Lawman in the past"  . PONV (postoperative nausea and vomiting)   . 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  [place]) 01/11/2014  . Shortness of breath on exertion    per patient, "sometimes depends on distance"  . Somatization disorder   . Spasm of muscle   . Systemic sclerosis ([place])   . TIA (transient ischemic attack)   . Unspecified hereditary and idiopathic peripheral neuropathy     Past Surgical History:  Procedure Laterality Date  . ABDOMINAL HYSTERECTOMY  1997  . arnold chiari malformation  2013  . BACK SURGERY  2001, 2011,2013   Lumbar x 2  . BALLOON DILATION N/A 12/22/2019   Procedure: BALLOON DILATION;   Surgeon: Wilford Corner, MD;  Location: [facility];  Service: Endoscopy;  Laterality: N/A;  . barium swallow test  10/2019  . BLADDER SURGERY  2011  . CARDIAC CATHETERIZATION    . CATARACT EXTRACTION    . CERVICAL SPINE SURGERY  2013  . CHOLECYSTECTOMY    . COLONOSCOPY  2009   "Normal" Dr Sharlett Iles  . COLONOSCOPY  2002   "Normal" Dr Freddrick March  . COLONOSCOPY  ~2013   Dr Ferdinand Lango.  "Chest pain with the prep" = aborted  . COLONOSCOPY WITH PROPOFOL N/A 03/29/2020   Procedure: COLONOSCOPY WITH PROPOFOL;  Surgeon: Wilford Corner, MD;  Location: [facility];  Service: Endoscopy;  Laterality: N/A;  . CRANIECTOMY SUBOCCIPITAL W/ CERVICAL LAMINECTOMY / CHIARI  2012   Dr Annette Stable, Chiari decompression with suboccipital craniectomy and  . CYSTOCELE REPAIR  2002   Dr Gaetano Net  . ESOPHAGOGASTRODUODENOSCOPY (EGD) WITH PROPOFOL N/A 12/22/2019   Procedure: ESOPHAGOGASTRODUODENOSCOPY (EGD) WITH PROPOFOL;  Surgeon: Wilford Corner, MD;  Location: [facility];  Service: Endoscopy;  Laterality: N/A;  . [place], 2000  . PITUITARY EXCISION  2010   pituitary cyst removed  . RECTOCELE REPAIR  2002   Dr. Gaetano Net  . RENAL BIOPSY  08/04/2019  . tear duct tubes  2005  . TUBAL LIGATION      MEDICATIONS: . amLODipine (NORVASC) 5 MG tablet  . aspirin EC 81 MG tablet  . CINNAMON PO  . isosorbide mononitrate (IMDUR) 30 MG 24 hr tablet  . metoprolol succinate (TOPROL-XL) 25 MG 24 hr tablet  . Misc Natural Products (TART CHERRY ADVANCED) CAPS  . nitroGLYCERIN (NITROSTAT) 0.4 MG SL tablet  . ondansetron (ZOFRAN) 4 MG tablet  . Polyethyl Glycol-Propyl Glycol (SYSTANE) 0.4-0.3 % SOLN   No current facility-administered medications for this encounter.    Myra Gianotti, PA-C Surgical Short Stay/Anesthesiology [facility] Phone [phone] [facility] Phone [phone] 09/28/2020 12:01 PM

## 2020-09-28 NOTE — Anesthesia Preprocedure Evaluation (Addendum)
Anesthesia Evaluation  Patient identified by MRN, date of birth, ID band Patient awake  General Assessment Comment:In Preop Patient states no CP or SOB. On travel to hospital was cold and had usual "tightness" when cold that dissipated prior to hospital  arrival.  Reviewed: Allergy & Precautions, NPO status , Patient's Chart, lab work & pertinent test results  History of Anesthesia Complications (+) PONV  Airway Mallampati: II  TM Distance: >3 FB     Dental   Pulmonary    breath sounds clear to auscultation       Cardiovascular + angina + CAD and + Peripheral Vascular Disease   Rhythm:Regular Rate:Normal     Neuro/Psych  Headaches, Anxiety TIA Neuromuscular disease    GI/Hepatic Neg liver ROS, GERD  ,  Endo/Other  negative endocrine ROS  Renal/GU negative Renal ROS     Musculoskeletal  (+) Arthritis ,   Abdominal   Peds  Hematology   Anesthesia Other Findings   Reproductive/Obstetrics                            Anesthesia Physical Anesthesia Plan  ASA: III  Anesthesia Plan: General   Post-op Pain Management:    Induction: Intravenous  PONV Risk Score and Plan: 4 or greater and Ondansetron, Dexamethasone and Midazolam  Airway Management Planned: Oral ETT  Additional Equipment:   Intra-op Plan:   Post-operative Plan: Extubation in OR  Informed Consent: I have reviewed the patients History and Physical, chart, labs and discussed the procedure including the risks, benefits and alternatives for the proposed anesthesia with the patient or authorized representative who has indicated his/her understanding and acceptance.     Dental advisory given  Plan Discussed with: Anesthesiologist and CRNA  Anesthesia Plan Comments: (See PAT note written 09/28/2020 by Myra Gianotti, PA-C. She has cardiac clearance by Dr. Minna Merritts. )       Anesthesia Quick Evaluation

## 2020-09-30 ENCOUNTER — Other Ambulatory Visit (HOSPITAL_COMMUNITY)
Admission: RE | Admit: 2020-09-30 | Discharge: 2020-09-30 | Disposition: A | Discharge status: Home / Self Care | Payer: Medicaid Other | Source: Ambulatory Visit | Attending: General Surgery | Admitting: General Surgery

## 2020-09-30 DIAGNOSIS — Z20822 Contact with and (suspected) exposure to covid-19: Secondary | ICD-10-CM | POA: Insufficient documentation

## 2020-09-30 DIAGNOSIS — Z01812 Encounter for preprocedural laboratory examination: Secondary | ICD-10-CM | POA: Diagnosis present

## 2020-09-30 LAB — SARS CORONAVIRUS 2 (TAT 6-24 HRS): SARS Coronavirus 2: NEGATIVE

## 2020-10-03 NOTE — Progress Notes (Signed)
I received a call from Ms Rayburn, she has new developments, since she was here for PAT on Friday. 1. Patient had chest pain, which she has a history of chest pain on Friday and took 1 NTG. Ms Appelhans states that he has chest pain every couple of months and cardiologist is aware of previous chest pain and has cleared for surgery.  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 Ms Livas daughter who lives with her has developed a sore throat, she does not have a fever or any other symptoms of Covid, daughter will help with patient's care. Ms Lannan daughter has not been vaccinated against Covid."  I asked patient to call the surgeon's office and inform them of these two developments. I asked Ebony Hail. Zelenak ,PA-C to follow up.

## 2020-10-04 ENCOUNTER — Ambulatory Visit (HOSPITAL_COMMUNITY)
Admission: RE | Admit: 2020-10-04 | Discharge: 2020-10-04 | Disposition: A | Discharge status: Home / Self Care | Payer: Medicaid Other | Attending: General Surgery | Admitting: General Surgery

## 2020-10-04 ENCOUNTER — Ambulatory Visit (HOSPITAL_COMMUNITY): Payer: Medicaid Other | Admitting: Anesthesiology

## 2020-10-04 ENCOUNTER — Ambulatory Visit (HOSPITAL_COMMUNITY): Payer: Medicaid Other | Admitting: Vascular Surgery

## 2020-10-04 ENCOUNTER — Encounter (HOSPITAL_COMMUNITY): Payer: Self-pay | Admitting: General Surgery

## 2020-10-04 ENCOUNTER — Encounter (HOSPITAL_COMMUNITY)
Admission: RE | Disposition: A | Discharge status: Home / Self Care | Payer: Self-pay | Source: Home / Self Care | Attending: General Surgery

## 2020-10-04 DIAGNOSIS — Z888 Allergy status to other drugs, medicaments and biological substances status: Secondary | ICD-10-CM | POA: Diagnosis not present

## 2020-10-04 DIAGNOSIS — Z886 Allergy status to analgesic agent status: Secondary | ICD-10-CM | POA: Diagnosis not present

## 2020-10-04 DIAGNOSIS — Z8673 Personal history of transient ischemic attack (TIA), and cerebral infarction without residual deficits: Secondary | ICD-10-CM | POA: Diagnosis not present

## 2020-10-04 DIAGNOSIS — K4020 Bilateral inguinal hernia, without obstruction or gangrene, not specified as recurrent: Secondary | ICD-10-CM | POA: Diagnosis present

## 2020-10-04 DIAGNOSIS — Z8719 Personal history of other diseases of the digestive system: Secondary | ICD-10-CM

## 2020-10-04 DIAGNOSIS — Z881 Allergy status to other antibiotic agents status: Secondary | ICD-10-CM | POA: Diagnosis not present

## 2020-10-04 DIAGNOSIS — Z880 Allergy status to penicillin: Secondary | ICD-10-CM | POA: Diagnosis not present

## 2020-10-04 DIAGNOSIS — Z20822 Contact with and (suspected) exposure to covid-19: Secondary | ICD-10-CM | POA: Diagnosis not present

## 2020-10-04 DIAGNOSIS — Z885 Allergy status to narcotic agent status: Secondary | ICD-10-CM | POA: Insufficient documentation

## 2020-10-04 DIAGNOSIS — Z9889 Other specified postprocedural states: Secondary | ICD-10-CM

## 2020-10-04 HISTORY — PX: XI ROBOTIC ASSISTED INGUINAL HERNIA REPAIR WITH MESH: SHX6706

## 2020-10-04 HISTORY — PX: INSERTION OF MESH: SHX5868

## 2020-10-04 LAB — SARS CORONAVIRUS 2 BY RT PCR (HOSPITAL ORDER, PERFORMED IN ~~LOC~~ HOSPITAL LAB): SARS Coronavirus 2: NEGATIVE

## 2020-10-04 SURGERY — REPAIR, HERNIA, INGUINAL, ROBOT-ASSISTED, LAPAROSCOPIC, USING MESH
Anesthesia: General | Site: Inguinal | Laterality: Bilateral

## 2020-10-04 MED ORDER — EPHEDRINE SULFATE-NACL 50-0.9 MG/10ML-% IV SOSY
PREFILLED_SYRINGE | INTRAVENOUS | Status: DC | PRN
  Administered 2020-10-04: 10 mg via INTRAVENOUS

## 2020-10-04 MED ORDER — ALBUMIN HUMAN 5 % IV SOLN: INTRAVENOUS | Status: DC | PRN

## 2020-10-04 MED ORDER — ROCURONIUM BROMIDE 10 MG/ML (PF) SYRINGE
PREFILLED_SYRINGE | INTRAVENOUS | Status: DC | PRN
  Administered 2020-10-04: 50 mg via INTRAVENOUS

## 2020-10-04 MED ORDER — 0.9 % SODIUM CHLORIDE (POUR BTL) OPTIME
TOPICAL | Status: DC | PRN
  Administered 2020-10-04: 1000 mL

## 2020-10-04 MED ORDER — EPHEDRINE 5 MG/ML INJ
INTRAVENOUS | Status: AC
  Filled 2020-10-04: qty 10

## 2020-10-04 MED ORDER — PROPOFOL 10 MG/ML IV BOLUS
INTRAVENOUS | Status: AC
  Filled 2020-10-04: qty 20

## 2020-10-04 MED ORDER — LACTATED RINGERS IV SOLN: INTRAVENOUS | Status: DC

## 2020-10-04 MED ORDER — CHLORHEXIDINE GLUCONATE CLOTH 2 % EX PADS: 6.0000 | MEDICATED_PAD | Freq: Once | CUTANEOUS | Status: DC

## 2020-10-04 MED ORDER — BUPIVACAINE HCL (PF) 0.25 % IJ SOLN
INTRAMUSCULAR | Status: AC
  Filled 2020-10-04: qty 30

## 2020-10-04 MED ORDER — ROCURONIUM BROMIDE 10 MG/ML (PF) SYRINGE
PREFILLED_SYRINGE | INTRAVENOUS | Status: AC
  Filled 2020-10-04: qty 10

## 2020-10-04 MED ORDER — VANCOMYCIN HCL IN DEXTROSE 1-5 GM/200ML-% IV SOLN
1000.0000 mg | INTRAVENOUS | Status: AC
  Administered 2020-10-04: 1000 mg via INTRAVENOUS
  Filled 2020-10-04: qty 200

## 2020-10-04 MED ORDER — FENTANYL CITRATE (PF) 100 MCG/2ML IJ SOLN: 25.0000 ug | INTRAMUSCULAR | Status: DC | PRN

## 2020-10-04 MED ORDER — SUCCINYLCHOLINE CHLORIDE 200 MG/10ML IV SOSY
PREFILLED_SYRINGE | INTRAVENOUS | Status: AC
  Filled 2020-10-04: qty 10

## 2020-10-04 MED ORDER — PROPOFOL 10 MG/ML IV BOLUS
INTRAVENOUS | Status: DC | PRN
  Administered 2020-10-04: 150 mg via INTRAVENOUS

## 2020-10-04 MED ORDER — FENTANYL CITRATE (PF) 250 MCG/5ML IJ SOLN
INTRAMUSCULAR | Status: AC
  Filled 2020-10-04: qty 5

## 2020-10-04 MED ORDER — MIDAZOLAM HCL 2 MG/2ML IJ SOLN
INTRAMUSCULAR | Status: DC | PRN
  Administered 2020-10-04: 2 mg via INTRAVENOUS

## 2020-10-04 MED ORDER — SUGAMMADEX SODIUM 200 MG/2ML IV SOLN
INTRAVENOUS | Status: DC | PRN
  Administered 2020-10-04: 200 mg via INTRAVENOUS

## 2020-10-04 MED ORDER — BUPIVACAINE LIPOSOME 1.3 % IJ SUSP
20.0000 mL | INTRAMUSCULAR | Status: DC
  Filled 2020-10-04: qty 20

## 2020-10-04 MED ORDER — LACTATED RINGERS IV SOLN: INTRAVENOUS | Status: DC | PRN

## 2020-10-04 MED ORDER — ONDANSETRON HCL 4 MG/2ML IJ SOLN
2.0000 mg | Freq: Once | INTRAMUSCULAR | Status: AC
Start: 2020-10-04 — End: 2020-10-04
  Administered 2020-10-04: 2 mg via INTRAVENOUS

## 2020-10-04 MED ORDER — SUCCINYLCHOLINE CHLORIDE 200 MG/10ML IV SOSY
PREFILLED_SYRINGE | INTRAVENOUS | Status: DC | PRN
  Administered 2020-10-04: 120 mg via INTRAVENOUS

## 2020-10-04 MED ORDER — FENTANYL CITRATE (PF) 250 MCG/5ML IJ SOLN
INTRAMUSCULAR | Status: DC | PRN
  Administered 2020-10-04: 50 ug via INTRAVENOUS

## 2020-10-04 MED ORDER — ORAL CARE MOUTH RINSE: 15.0000 mL | Freq: Once | OROMUCOSAL | Status: AC

## 2020-10-04 MED ORDER — BUPIVACAINE HCL 0.25 % IJ SOLN
INTRAMUSCULAR | Status: DC | PRN
  Administered 2020-10-04: 7 mL

## 2020-10-04 MED ORDER — CHLORHEXIDINE GLUCONATE 0.12 % MT SOLN
15.0000 mL | Freq: Once | OROMUCOSAL | Status: AC
  Administered 2020-10-04: 15 mL via OROMUCOSAL
  Filled 2020-10-04: qty 15

## 2020-10-04 MED ORDER — MIDAZOLAM HCL 2 MG/2ML IJ SOLN
INTRAMUSCULAR | Status: AC
  Filled 2020-10-04: qty 2

## 2020-10-04 MED ORDER — ONDANSETRON HCL 4 MG/2ML IJ SOLN
INTRAMUSCULAR | Status: DC | PRN
  Administered 2020-10-04: 4 mg via INTRAVENOUS

## 2020-10-04 MED ORDER — LIDOCAINE HCL (PF) 2 % IJ SOLN
INTRAMUSCULAR | Status: AC
  Filled 2020-10-04: qty 5

## 2020-10-04 MED ORDER — LIDOCAINE 2% (20 MG/ML) 5 ML SYRINGE
INTRAMUSCULAR | Status: DC | PRN
  Administered 2020-10-04: 100 mg via INTRAVENOUS

## 2020-10-04 MED ORDER — ONDANSETRON HCL 4 MG/2ML IJ SOLN
INTRAMUSCULAR | Status: AC
  Filled 2020-10-04: qty 2

## 2020-10-04 MED ORDER — DEXAMETHASONE SODIUM PHOSPHATE 10 MG/ML IJ SOLN
INTRAMUSCULAR | Status: AC
  Filled 2020-10-04: qty 1

## 2020-10-04 MED ORDER — SODIUM CHLORIDE 0.9 % IV SOLN
INTRAVENOUS | Status: DC | PRN
  Administered 2020-10-04: 40 mL

## 2020-10-04 MED ORDER — ENSURE PRE-SURGERY PO LIQD: 296.0000 mL | Freq: Once | ORAL | Status: DC

## 2020-10-04 MED ORDER — DEXAMETHASONE SODIUM PHOSPHATE 10 MG/ML IJ SOLN
INTRAMUSCULAR | Status: DC | PRN
  Administered 2020-10-04: 8 mg via INTRAVENOUS

## 2020-10-04 SURGICAL SUPPLY — 55 items
ADH SKN CLS APL DERMABOND .7 (GAUZE/BANDAGES/DRESSINGS) ×2
APL PRP STRL LF DISP 70% ISPRP (MISCELLANEOUS) ×2
CHLORAPREP W/TINT 26 (MISCELLANEOUS) ×3 IMPLANT
COVER MAYO STAND STRL (DRAPES) ×3 IMPLANT
COVER SURGICAL LIGHT HANDLE (MISCELLANEOUS) ×3 IMPLANT
COVER TIP SHEARS 8 DVNC (MISCELLANEOUS) ×2 IMPLANT
COVER TIP SHEARS 8MM DA VINCI (MISCELLANEOUS) ×3
COVER WAND RF STERILE (DRAPES) IMPLANT
DECANTER SPIKE VIAL GLASS SM (MISCELLANEOUS) ×3 IMPLANT
DEFOGGER SCOPE WARMER CLEARIFY (MISCELLANEOUS) ×3 IMPLANT
DERMABOND ADVANCED (GAUZE/BANDAGES/DRESSINGS) ×1
DERMABOND ADVANCED .7 DNX12 (GAUZE/BANDAGES/DRESSINGS) ×2 IMPLANT
DEVICE TROCAR PUNCTURE CLOSURE (ENDOMECHANICALS) ×3 IMPLANT
DRAPE ARM DVNC X/XI (DISPOSABLE) ×8 IMPLANT
DRAPE COLUMN DVNC XI (DISPOSABLE) ×2 IMPLANT
DRAPE CV SPLIT W-CLR ANES SCRN (DRAPES) ×3 IMPLANT
DRAPE DA VINCI XI ARM (DISPOSABLE) ×12
DRAPE DA VINCI XI COLUMN (DISPOSABLE) ×3
DRAPE ORTHO SPLIT 77X108 STRL (DRAPES) ×3
DRAPE SURG ORHT 6 SPLT 77X108 (DRAPES) ×2 IMPLANT
ELECT REM PT RETURN 9FT ADLT (ELECTROSURGICAL) ×3
ELECTRODE REM PT RTRN 9FT ADLT (ELECTROSURGICAL) ×2 IMPLANT
GLOVE BIO SURGEON STRL SZ7.5 (GLOVE) ×6 IMPLANT
GOWN STRL REUS W/ TWL LRG LVL3 (GOWN DISPOSABLE) ×4 IMPLANT
GOWN STRL REUS W/ TWL XL LVL3 (GOWN DISPOSABLE) ×4 IMPLANT
GOWN STRL REUS W/TWL 2XL LVL3 (GOWN DISPOSABLE) ×3 IMPLANT
GOWN STRL REUS W/TWL LRG LVL3 (GOWN DISPOSABLE) ×6
GOWN STRL REUS W/TWL XL LVL3 (GOWN DISPOSABLE) ×6
KIT BASIN OR (CUSTOM PROCEDURE TRAY) ×3 IMPLANT
KIT TURNOVER KIT B (KITS) ×3 IMPLANT
MARKER SKIN DUAL TIP RULER LAB (MISCELLANEOUS) ×3 IMPLANT
MESH 3DMAX 4X6 LT LRG (Mesh General) ×2 IMPLANT
MESH 3DMAX 4X6 RT LRG (Mesh General) ×2 IMPLANT
NEEDLE HYPO 22GX1.5 SAFETY (NEEDLE) ×3 IMPLANT
NEEDLE INSUFFLATION 14GA 120MM (NEEDLE) ×3 IMPLANT
OBTURATOR OPTICAL STANDARD 8MM (TROCAR)
OBTURATOR OPTICAL STND 8 DVNC (TROCAR)
OBTURATOR OPTICALSTD 8 DVNC (TROCAR) IMPLANT
PAD ARMBOARD 7.5X6 YLW CONV (MISCELLANEOUS) ×6 IMPLANT
SEAL CANN UNIV 5-8 DVNC XI (MISCELLANEOUS) ×4 IMPLANT
SEAL XI 5MM-8MM UNIVERSAL (MISCELLANEOUS) ×6
SET IRRIG TUBING LAPAROSCOPIC (IRRIGATION / IRRIGATOR) IMPLANT
SET TUBE SMOKE EVAC HIGH FLOW (TUBING) ×3 IMPLANT
STOPCOCK 4 WAY LG BORE MALE ST (IV SETS) ×3 IMPLANT
SUT MNCRL AB 4-0 PS2 18 (SUTURE) ×3 IMPLANT
SUT VIC AB 2-0 SH 27 (SUTURE)
SUT VIC AB 2-0 SH 27X BRD (SUTURE) IMPLANT
SUT VLOC 180 2-0 6IN GS21 (SUTURE) ×3 IMPLANT
SUT VLOC 180 2-0 9IN GS21 (SUTURE) ×3 IMPLANT
SYR 30ML SLIP (SYRINGE) ×3 IMPLANT
SYR TOOMEY 50ML (SYRINGE) ×3 IMPLANT
TOWEL GREEN STERILE FF (TOWEL DISPOSABLE) ×3 IMPLANT
TRAY FOLEY MTR SLVR 14FR STAT (SET/KITS/TRAYS/PACK) IMPLANT
TRAY FOLEY MTR SLVR 16FR STAT (SET/KITS/TRAYS/PACK) IMPLANT
TRAY LAPAROSCOPIC MC (CUSTOM PROCEDURE TRAY) ×3 IMPLANT

## 2020-10-04 NOTE — Op Note (Signed)
10/04/2020  10:42 AM  PATIENT:  Judie Petit  54 y.o. female  PRE-OPERATIVE DIAGNOSIS:  BILATERAL INGUINAL HERNIA  POST-OPERATIVE DIAGNOSIS:  BILATERAL INDIRECT INGUINAL HERNIA AND CORD LIPOMA  PROCEDURE:  Procedure(s): XI ROBOTIC ASSISTED BILATERAL INGUINAL HERNIA REPAIR WITH MESH (Bilateral)  SURGEON:  Surgeon(s) and Role:    * Ralene Ok, MD - Primary  ASSISTANTS: Pryor Curia, RNFA   ANESTHESIA:   local, regional and general  EBL:  minimal   BLOOD ADMINISTERED:none  DRAINS: none   LOCAL MEDICATIONS USED:  BUPIVICAINE  and OTHER exparil  SPECIMEN:  No Specimen  DISPOSITION OF SPECIMEN:  N/A  COUNTS:  YES  TOURNIQUET:  * No tourniquets in log *  DICTATION: .Dragon Dictation The patient was taken back to the operating room. The patient was placed in supine position with bilateral SCDs in place. The patient was prepped and draped in the usual sterile fashion.  After appropriate anitbiotics were confirmed, a time-out was confirmed and all facts were verified.  At this time a Veress needle technique was used to inspect the abdomen approximately 10 cm from the umbilicus in the  Midclavicular line. This time a 12 mm robotic trocar was placed into the abdomen. The camera was placed there was no injury to any intra-abdominal organs. A 61mm umbilical port was placed just superior to the umbilicus. An 8 mm port was placed approximately 10 cm lateral to the umbilicus on the left paramedian side.  Robot was positioned over the patient and the ports were docked in the usual fashion.  At this time the right-sided peritoneum was taken down from the medial umbilical ligament laterally. The pre-peritoneal space was entered. Dissection was taken down to Cooper's ligament. At this time it was apparent there was a small indirect hernia.  At this time proceeded clean out the rest Cooper's ligament and the medial to lateral direction. A proceeded laterally to dissect the hernia.  The hernia was circumferentially dissected away from the surrounding tissue. There was a cord lipoma that was dissected out of the inguinal canal.  The round ligament was cauterized and transected. The hernia was dissected back.  At this time I proceeded to create a pocket laterally for the mesh. Once the pocket was created the peritoneum was stripped back to approximately the base of the cord.   At this time I turned my attention to the Left side.  The peritoneum was taken down from the medial umbilical ligament laterally. The pre-peritoneal space was entered. Dissection was taken down to Cooper's ligament. At this time it was apparent there was a small indirect hernia.  At this time proceeded clean out the rest Cooper's ligament and the medial to lateral direction. A proceeded laterally to dissect the spermatic cord. The spermatic cord was circumferentially dissected away from the surrounding musculature and tissue. The vas deferens was identified and protected all portions of the case. There was a small indirect hernia was seen and a cord lipoma was dissected back from the inguinal canal.  At this time I proceeded to create a pocket laterally for the mesh.   At this time each sided BArd 3D max Large mesh and 2-0 V-lock and 3-0 vicryl x1 was introduced into the abdomen.  At this time each piece of mesh was in placed into the dissected area. They covered both the direct and indirect spaces appropriately. This also covered  The femoral space. At this time the 2-0 Vicryl stitches were then used to tack theMesh to the  Cooper's ligament 1 medially. 2-0 Vicryl stitches were used to tack the mesh to the anterior abdominal wall in interrupted fashion. The mesh lay flat from medial to lateral.    At this time a 2-0 V- lock stitch was used to close the peritoneum in a [place] running fashion.  At this time the robot was undocked. The umbilical port site was reapproximated using a 0 Vicryl via an Endo Close  device 1. All ports were removed. The skin was reapproximated and all port sites using 4-0 Monocryl subcuticular fashion.  The patient the procedure well was taken to the recovery    PLAN OF CARE: Discharge to home after PACU  PATIENT DISPOSITION:  PACU - hemodynamically stable.   Delay start of Pharmacological VTE agent (>24hrs) due to surgical blood loss or risk of bleeding: not applicable

## 2020-10-04 NOTE — Discharge Instructions (Signed)
[facility] _______[facility] °INGUINAL HERNIA REPAIR: POST OP INSTRUCTIONS ° °Always review your discharge instruction sheet given to you by the facility where your surgery was performed. °IF YOU HAVE DISABILITY OR FAMILY LEAVE FORMS, YOU MUST BRING THEM TO THE OFFICE FOR PROCESSING.   °DO NOT GIVE THEM TO YOUR DOCTOR. ° °1. A  prescription for pain medication may be given to you upon discharge.  Take your pain medication as prescribed, if needed.  If narcotic pain medicine is not needed, then you may take acetaminophen (Tylenol) or ibuprofen (Advil) as needed. °2. Take your usually prescribed medications unless otherwise directed. °If you need a refill on your pain medication, please contact your pharmacy.  They will contact our office to request authorization. Prescriptions will not be filled after 5 pm or on week-ends. °3. You should follow a light diet the first 24 hours after arrival home, such as soup and crackers, etc.  Be sure to include lots of fluids daily.  Resume your normal diet the day after surgery. °4.Most patients will experience some swelling and bruising around the umbilicus or in the groin and scrotum.  Ice packs and reclining will help.  Swelling and bruising can take several days to resolve.  °6. It is common to experience some constipation if taking pain medication after surgery.  Increasing fluid intake and taking a stool softener (such as Colace) will usually help or prevent this problem from occurring.  A mild laxative (Milk of Magnesia or Miralax) should be taken according to package directions if there are no bowel movements after 48 hours. °7. Unless discharge instructions indicate otherwise, you may remove your bandages 24-48 hours after surgery, and you may shower at that time.  You may have steri-strips (small skin tapes) in place directly over the incision.  These strips should be left on the skin for 7-10 days.  If your surgeon used skin glue on the incision, you may  shower in 24 hours.  The glue will flake off over the next 2-3 weeks.  Any sutures or staples will be removed at the office during your follow-up visit. °8. ACTIVITIES:  You may resume regular (light) daily activities beginning the next day--such as daily self-care, walking, climbing stairs--gradually increasing activities as tolerated.  You may have sexual intercourse when it is comfortable.  Refrain from any heavy lifting or straining until approved by your doctor. ° °a.You may drive when you are no longer taking prescription pain medication, you can comfortably wear a seatbelt, and you can safely maneuver your car and apply brakes. °b.RETURN TO WORK:   °_____________________________________________ ° °9.You should see your doctor in the office for a follow-up appointment approximately 2-3 weeks after your surgery.  Make sure that you call for this appointment within a day or two after you arrive home to insure a convenient appointment time. °10.OTHER INSTRUCTIONS: _________________________ °   _____________________________________ ° °WHEN TO CALL YOUR DOCTOR: °1. Fever over 101.0 °2. Inability to urinate °3. Nausea and/or vomiting °4. Extreme swelling or bruising °5. Continued bleeding from incision. °6. Increased pain, redness, or drainage from the incision ° °The clinic staff is available to answer your questions during regular business hours.  Please don’t hesitate to call and ask to speak to one of the nurses for clinical concerns.  If you have a medical emergency, go to the nearest emergency room or call 911.  A surgeon from [facility] is always on call at the hospital ° ° °[address], [address], [place], [place]  [address] ? ° [address] [address], [place],    [address] °[phone] ? [phone] ? FAX [phone] °Web site: [email or web address] ° °

## 2020-10-04 NOTE — Transfer of Care (Signed)
Immediate Anesthesia Transfer of Care Note  Patient: Virginia Shaw  Procedure(s) Performed: XI ROBOTIC ASSISTED BILATERAL INGUINAL HERNIA REPAIR WITH MESH (Bilateral ) INSERTION OF MESH (Bilateral Inguinal)  Patient Location: PACU  Anesthesia Type:General  Level of Consciousness: drowsy and patient cooperative  Airway & Oxygen Therapy: Patient Spontanous Breathing and Patient connected to face mask oxygen  Post-op Assessment: Report given to RN and Post -op Vital signs reviewed and stable  Post vital signs: Reviewed and stable  Last Vitals:  Vitals Value Taken Time  BP 115/76 10/04/20 1058  Temp    Pulse 82 10/04/20 1059  Resp 21 10/04/20 1059  SpO2 100 % 10/04/20 1059  Vitals shown include unvalidated device data.  Last Pain:  Vitals:   10/04/20 0735  TempSrc:   PainSc: 5       Patients Stated Pain Goal: 3 (95/58/31 6742)  Complications: No complications documented.

## 2020-10-04 NOTE — Anesthesia Procedure Notes (Signed)
Procedure Name: Intubation Date/Time: 10/04/2020 9:50 AM Performed by: Kathryne Hitch, CRNA Pre-anesthesia Checklist: Patient identified, Emergency Drugs available, Suction available and Patient being monitored Patient Re-evaluated:Patient Re-evaluated prior to induction Oxygen Delivery Method: Circle system utilized Preoxygenation: Pre-oxygenation with 100% oxygen Induction Type: IV induction Ventilation: Mask ventilation without difficulty Laryngoscope Size: Miller and 2 Grade View: Grade I Tube type: Oral Tube size: 7.0 mm Number of attempts: 1 Airway Equipment and Method: Stylet and Oral airway Placement Confirmation: ETT inserted through vocal cords under direct vision,  positive ETCO2 and breath sounds checked- equal and bilateral Secured at: 21 cm Tube secured with: Tape Dental Injury: Teeth and Oropharynx as per pre-operative assessment

## 2020-10-04 NOTE — Anesthesia Postprocedure Evaluation (Signed)
Anesthesia Post Note  Patient: Virginia Shaw  Procedure(s) Performed: XI ROBOTIC ASSISTED BILATERAL INGUINAL HERNIA REPAIR WITH MESH (Bilateral ) INSERTION OF MESH (Bilateral Inguinal)     Patient location during evaluation: PACU Anesthesia Type: General Level of consciousness: awake Pain management: pain level controlled Vital Signs Assessment: post-procedure vital signs reviewed and stable Respiratory status: spontaneous breathing Cardiovascular status: stable Postop Assessment: no apparent nausea or vomiting Anesthetic complications: no   No complications documented.  Last Vitals:  Vitals:   10/04/20 1113 10/04/20 1128  BP: 115/80 112/80  Pulse: 77 75  Resp: 16 17  Temp:  36.7 C  SpO2: 99% 97%    Last Pain:  Vitals:   10/04/20 1128  TempSrc:   PainSc: 3                  Chasiti Waddington

## 2020-10-04 NOTE — Progress Notes (Signed)
Patient stated she had chest pain on Friday relieved by a dose of nitro.  Assessed her chest pain today and no longer pain.  She stated she had chest tightness that she believes is due to the weather and is subsiding.  Dr. Nyoka Cowden made aware.

## 2020-10-04 NOTE — H&P (Signed)
History of Present Illness  The patient is a 54 year old female who presents with an inguinal hernia. Patient is a 54 year old female who fallback today secondary to continued left inguinal/thigh pain. Patient was seen approximately 3 years ago. Patient states that the pain has increased in severity and frequency. She does also have back issues and back pain. She's had 3 previous back surgeries.  Patient states that she does not notice a bulge of the hernia in her groin at this time. Patient undergo CT scan in July of this year. I did review this personally. This does appear to show bilateral inguinal hernias, fat-containing.  Patient does appear to have some tendinitis in her wrist. Patient is scheduled to undergo surgery with Dr. Erlinda Hong  Patient sees Dr. Shea Evans nephrologist.   --------------------------------------------- Update: Patient comes back in secondary to continue left inguinal pain. She states it does continue to radiate down her left inguinal area, and left thigh. Surgery is planned for bilateral open inguinal hernia repair with mesh on 12/26. There've been no other further changes.  __________________________________________________ Referred by: Dr. Gertie Fey Chief Complaint: Left inguinal hernia  Patient is a 54 year old female with a history of TIAs, angina, and a left inguinal hernia. Patient states she's had pain to her left inguinal area for several months. She states that the pain radiates the lower medial thigh. She states this sometimes bilaterally. Patient underwent ultrasound Dr. Marcello Moores office which revealed a possible hernia. Patient states that it With Valsalva, coughing, sneezing, bowel movements caused the pain to be provoked. She states avoiding the sometimes causes the pain to be released. There are no other modifying factors.    Allergies Iodinated Diagnostic Agents  Protonix *ULCER DRUGS*  Acetaminophen *ANALGESICS - NonNarcotic*   Benzonatate *COUGH/COLD/ALLERGY*  Budesonide *ANTIASTHMATIC AND BRONCHODILATOR AGENTS*  Buprenorphine *ANALGESICS - OPIOID*  Ceftin *CEPHALOSPORINS*  Cefuroxime Axetil *CEPHALOSPORINS*  Cephalexin *CEPHALOSPORINS*  Citric Acid *CHEMICALS*  Clindamycin HCl *ANTI-INFECTIVE AGENTS - MISC.*  Codeine Phosphate *ANALGESICS - OPIOID*  Cortisone *CORTICOSTEROIDS*  DiazePAM *ANTIANXIETY AGENTS*  Diclofenac *ANALGESICS - ANTI-INFLAMMATORY*  DilTIAZem CD *CALCIUM CHANNEL BLOCKERS*  Doxycycline *DERMATOLOGICALS*  Esomeprazole Magnesium *ULCER DRUGS*  Hydrocodone-Acetaminophen *ANALGESICS - OPIOID*  Ibuprofen *ANALGESICS - ANTI-INFLAMMATORY*  Macrobid *URINARY ANTI-INFECTIVES*  Maxalt *MIGRAINE PRODUCTS*  Morphine Sulfate (Concentrate) *ANALGESICS - OPIOID*  Naproxen *ANALGESICS - ANTI-INFLAMMATORY*  NSAIDs  Omeprazole *CHEMICALS*  OxyCODONE HCl *ANALGESICS - OPIOID*  Penicillins  Phenazopyridine HCl *GENITOURINARY AGENTS - MISCELLANEOUS*  PredniSONE *CORTICOSTEROIDS*  Tomato (Diagnostic) *DIAGNOSTIC PRODUCTS*   Medication History  Amlodipine-Atorvastatin (5-10MG  Tablet, Oral) Active. Aspirin (81MG  Tablet Chewable, Oral) Active. Isosorbide Mononitrate (20MG  Tablet, Oral) Active. Nitroglycerin (0.4MG  Tab Sublingual, Sublingual) Active. Metoprolol Succinate ER (25MG  Tablet ER 24HR, Oral) Active. Medications Reconciled    Review of Systems  General Present- Weight Gain. Not Present- Appetite Loss, Chills, Fatigue, Fever, Night Sweats and Weight Loss. Skin Present- Non-Healing Wounds. Not Present- Change in Wart/Mole, Dryness, Hives, Jaundice, New Lesions, Rash and Ulcer. HEENT Present- Earache, Hoarseness, Seasonal Allergies, Sinus Pain, Sore Throat, Visual Disturbances and Wears glasses/contact lenses. Not Present- Hearing Loss, Nose Bleed, Oral Ulcers, Ringing in the Ears and Yellow Eyes. Respiratory Present- Difficulty Breathing. Not Present-  Bloody sputum, Chronic Cough, Snoring and Wheezing. Breast Present- Breast Mass, Breast Pain and Nipple Discharge. Not Present- Skin Changes. Cardiovascular Present- Chest Pain, Difficulty Breathing Lying Down, Leg Cramps, Palpitations, Shortness of Breath and Swelling of Extremities. Not Present- Rapid Heart Rate. Gastrointestinal Present- Abdominal Pain, Bloating, Change in Bowel Habits, Nausea and Rectal Pain. Not Present- Bloody  Stool, Chronic diarrhea, Constipation, Difficulty Swallowing, Excessive gas, Gets full quickly at meals, Hemorrhoids, Indigestion and Vomiting. Female Genitourinary Present- Frequency, Nocturia, Painful Urination, Pelvic Pain and Urgency. Musculoskeletal Present- Back Pain, Joint Pain, Joint Stiffness, Muscle Pain, Muscle Weakness and Swelling of Extremities. Neurological Present- Headaches, Numbness, Tingling, Trouble walking and Weakness. Not Present- Decreased Memory, Fainting, Seizures and Tremor. Psychiatric Present- Change in Sleep Pattern. Not Present- Anxiety, Bipolar, Depression, Fearful and Frequent crying. Endocrine Present- Cold Intolerance. Not Present- Excessive Hunger, Hair Changes, Heat Intolerance, Hot flashes and New Diabetes. Hematology Present- Blood Thinners, Easy Bruising and Gland problems. Not Present- Excessive bleeding, HIV and Persistent Infections. All other systems negative BP 107/60   Pulse 97   Temp (!) 97.5 F (36.4 C) (Temporal)   Resp 18   Ht 5' (1.524 m)   Wt 63.5 kg   SpO2 98%   BMI 27.34 kg/m     Physical Exam The physical exam findings are as follows: Note: Constitutional: No acute distress, conversant, appears stated age  Eyes: Anicteric sclerae, moist conjunctiva, no lid lag  Neck: No thyromegaly, trachea midline, no cervical lymphadenopathy  Lungs: Clear to auscultation biilaterally, normal respiratory effot  Cardiovascular: regular rate & rhythm, no murmurs, no peripheal edema, pedal pulses 2+  GI:  Soft, no masses or hepatosplenomegaly, non-tender to palpation  MSK: Normal gait, no clubbing cyanosis, edema  Skin: No rashes, palpation reveals normal skin turgor  Psychiatric: Appropriate judgment and insight, oriented to person, place, and time  Abdomen Inspection Hernias - Inguinal hernia - Bilateral - Reducible - Bilateral (Small) .    Assessment & Plan  BILATERAL INGUINAL HERNIA WITHOUT OBSTRUCTION OR GANGRENE, RECURRENCE NOT SPECIFIED (K40.20) Impression: 54 year old female with symptomatic left and right inguinal hernia. Patient has a history of TIAs as well as angina. We will get clearance by Dr. Durene Romans and Dr. Leroy Sea. These are her cardiologist and neurologist respectively. Patient sees Dr. Shea Evans her nephrologist. We will get clearance from her cardiologist and nephrologist at this point.  1. The patient will like to proceed to the operating room for robotic bilateral inguinal hernia repair with mesh.  2. I discussed with the patient the signs and symptoms of incarceration and strangulation and the need to proceed to the ER should they occur.  3. I discussed with the patient the risks and benefits of the procedure to include but not limited to: Infection, bleeding, damage to surrounding structures, possible need for further surgery, possible nerve pain, and possible recurrence. The patient was understanding and wishes to proceed.

## 2020-10-05 ENCOUNTER — Encounter (HOSPITAL_COMMUNITY): Payer: Self-pay | Admitting: General Surgery

## 2020-10-06 NOTE — Progress Notes (Signed)
Office Visit Note  Patient: Virginia Shaw             Date of Birth: 01/01/1966           MRN: [id number]             PCP: [name], MD Referring: [name], MD Visit Date: 10/19/2020 Occupation: @GUAROCC @  Subjective:  Pain in both hands.   History of Present Illness: Tracee Mccreery is a 54 y.o. female with history of chronic joint pain. She is also had 14 3 3  eta positive and positive synovitis on the ultrasound. She is on no medications. She denies any joint swelling. She continues to have some discomfort in her hands.  None of the other joints are painful.  She had bilateral inguinal hernia repair about 2 weeks ago which has been causing a lot of discomfort.    Activities of Daily Living:  Patient reports morning stiffness for 10-15 minutes.   Patient Reports nocturnal pain.  Difficulty dressing/grooming: Denies Difficulty climbing stairs: Reports Difficulty getting out of chair: Reports Difficulty using hands for taps, buttons, cutlery, and/or writing: Reports  Review of Systems  Constitutional: Negative for fatigue.  HENT: Positive for mouth dryness. Negative for mouth sores and nose dryness.   Eyes: Positive for dryness. Negative for pain and itching.  Respiratory: Negative for shortness of breath and difficulty breathing.   Cardiovascular: Negative for chest pain and palpitations.  Gastrointestinal: Negative for blood in stool, constipation and diarrhea.  Endocrine: Positive for increased urination.  Genitourinary: Positive for difficulty urinating and painful urination.  Musculoskeletal: Positive for arthralgias, joint pain, joint swelling, myalgias, morning stiffness, muscle tenderness and myalgias.  Skin: Positive for color change. Negative for rash and redness.  Allergic/Immunologic: Negative for susceptible to infections.  Neurological: Positive for numbness, headaches and weakness. Negative for dizziness and memory loss.  Hematological:  Negative for bruising/bleeding tendency.  Psychiatric/Behavioral: Negative for confusion.    PMFS History:  Patient Active Problem List   Diagnosis Date Noted  . Special screening for malignant neoplasms, colon 03/29/2020  . Dysphagia 12/22/2019  . DDD (degenerative disc disease), cervical 08/11/2018  . DDD (degenerative disc disease), lumbar 08/11/2018  . Somatic complaints, multiple 03/14/2017  . Chronic pain syndrome 08/25/2014  . Irritable bowel syndrome (IBS) 01/12/2014  . 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 Chronic abdominal pain 01/11/2014  . Chronic venous insufficiency 10/30/2013  . Varicose veins 10/30/2013  . Chest tightness 03/25/2012  . 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 RECTAL BLEEDING 08/26/2008  . UTI'S, RECURRENT 08/26/2008    Past Medical History:  Diagnosis Date  . Abnormal involuntary movements(781.0)   . Allergic rhinitis, cause unspecified   . Anxiety state, unspecified   . Cervical spondylosis without myelopathy   . Chronic headaches   . Disturbance of skin sensation   . Fatty liver    per patient, "not sure but does remember being told"  . GERD (gastroesophageal reflux disease)   . History of chicken pox   . Irritable bowel syndrome   . Low blood pressure   . Lumbago   . Myalgia and myositis, unspecified   . Osteoporosis   . Other and unspecified angina pectoris   . Other malaise and fatigue   . Peripheral vascular disease ([place])    per patient "diagnosed with it about 13-14 years ago and does not currently see someone for it, saw Dr. Vista Lawman in the past"  . PONV (postoperative nausea and vomiting)   .  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 Shortness of breath on exertion    per patient, "sometimes depends on distance"  . Somatization disorder   . Spasm of muscle   . Systemic sclerosis ([place])   . TIA (transient ischemic attack)   . Unspecified hereditary and idiopathic peripheral neuropathy     Family History  Problem Relation Age  of Onset  . Hypertension Father   . Hyperlipidemia Father   . Diabetes Father   . Stroke Father   . Cancer Maternal Uncle        lung  . Cancer Paternal Aunt        lung  . Migraines Mother   . Heart attack Paternal Grandmother   . Heart attack Paternal Grandfather   . Hypertension Paternal Grandfather   . Migraines Sister    Past Surgical History:  Procedure Laterality Date  . ABDOMINAL HYSTERECTOMY  1997  . arnold chiari malformation  2013  . BACK SURGERY  2001, 2011,2013   Lumbar x 2  . BALLOON DILATION N/A 12/22/2019   Procedure: BALLOON DILATION;  Surgeon: Wilford Corner, MD;  Location: [facility];  Service: Endoscopy;  Laterality: N/A;  . barium swallow test  10/2019  . BLADDER SURGERY  2011  . CARDIAC CATHETERIZATION    . CATARACT EXTRACTION    . CERVICAL SPINE SURGERY  2013  . CHOLECYSTECTOMY    . COLONOSCOPY  2009   "Normal" Dr Sharlett Iles  . COLONOSCOPY  2002   "Normal" Dr Freddrick March  . COLONOSCOPY  ~2013   Dr Ferdinand Lango.  "Chest pain with the prep" = aborted  . COLONOSCOPY WITH PROPOFOL N/A 03/29/2020   Procedure: COLONOSCOPY WITH PROPOFOL;  Surgeon: Wilford Corner, MD;  Location: [facility];  Service: Endoscopy;  Laterality: N/A;  . CRANIECTOMY SUBOCCIPITAL W/ CERVICAL LAMINECTOMY / CHIARI  2012   Dr Annette Stable, Chiari decompression with suboccipital craniectomy and  . CYSTOCELE REPAIR  2002   Dr Gaetano Net  . ESOPHAGOGASTRODUODENOSCOPY (EGD) WITH PROPOFOL N/A 12/22/2019   Procedure: ESOPHAGOGASTRODUODENOSCOPY (EGD) WITH PROPOFOL;  Surgeon: Wilford Corner, MD;  Location: [facility];  Service: Endoscopy;  Laterality: N/A;  . INSERTION OF MESH Bilateral 10/04/2020   Procedure: INSERTION OF MESH;  Surgeon: Ralene Ok, MD;  Location: [facility];  Service: General;  Laterality: Bilateral;  . OOPHORECTOMY  1999, 2000  . PITUITARY EXCISION  2010   pituitary cyst removed  . RECTOCELE REPAIR  2002   Dr. Gaetano Net  . RENAL BIOPSY  08/04/2019  . tear duct tubes  2005   . TUBAL LIGATION    . XI ROBOTIC ASSISTED INGUINAL HERNIA REPAIR WITH MESH Bilateral 10/04/2020   Procedure: XI ROBOTIC ASSISTED BILATERAL INGUINAL HERNIA REPAIR WITH MESH;  Surgeon: Ralene Ok, MD;  Location: [facility];  Service: General;  Laterality: Bilateral;   Social History   Social History Narrative   Lives at home with children. ( 2 daughters)   Regular exercise-yes   Caffeine Use-no   GED   Not Working outside home    There is no immunization history on file for this patient.   Objective: Vital Signs: BP 102/67 (BP Location: Left Arm, Patient Position: Sitting, Cuff Size: Normal)   Pulse 73   Resp 13   Ht 4' 11.75" (1.518 m)   Wt 140 lb 6.4 oz (63.7 kg)   BMI 27.65 kg/m    Physical Exam Vitals and nursing note reviewed.  Constitutional:      Appearance: She is well-developed and well-nourished.  HENT:  Head: Normocephalic and atraumatic.  Eyes:     Extraocular Movements: EOM normal.     Conjunctiva/sclera: Conjunctivae normal.  Cardiovascular:     Rate and Rhythm: Normal rate and regular rhythm.     Pulses: Intact distal pulses.     Heart sounds: Normal heart sounds.  Pulmonary:     Effort: Pulmonary effort is normal.     Breath sounds: Normal breath sounds.  Abdominal:     General: Bowel sounds are normal.     Palpations: Abdomen is soft.  Musculoskeletal:     Cervical back: Normal range of motion.  Lymphadenopathy:     Cervical: No cervical adenopathy.  Skin:    General: Skin is warm and dry.     Capillary Refill: Capillary refill takes less than 2 seconds.  Neurological:     Mental Status: She is alert and oriented to person, place, and time.  Psychiatric:        Mood and Affect: Mood and affect normal.        Behavior: Behavior normal.      Musculoskeletal Exam: C-spine was in good range of motion.  Shoulder joints, elbow joints, wrist joints, MCPs PIPs and DIPs with good range of motion with no synovitis.  She has some prominence of PIP  and DIP joints.  Hip joints, knee joints, ankles with good range of motion.  She had no tenderness over MTPs.  CDAI Exam: CDAI Score: -- Patient Global: --; Provider Global: -- Swollen: --; Tender: -- Joint Exam 10/19/2020   No joint exam has been documented for this visit   There is currently no information documented on the homunculus. Go to the Rheumatology activity and complete the homunculus joint exam.  Investigation: No additional findings.  Imaging: No results found.  Recent Labs: Lab Results  Component Value Date   WBC 6.8 09/27/2020   HGB 14.2 09/27/2020   PLT 393 09/27/2020   NA 141 09/27/2020   K 4.8 09/27/2020   CL 105 09/27/2020   CO2 27 09/27/2020   GLUCOSE 103 (H) 09/27/2020   BUN 12 09/27/2020   CREATININE 0.78 09/27/2020   BILITOT 0.6 09/27/2020   ALKPHOS 64 09/27/2020   AST 24 09/27/2020   ALT 22 09/27/2020   PROT 7.0 09/27/2020   ALBUMIN 4.0 09/27/2020   CALCIUM 9.3 09/27/2020   GFRAA >60 04/21/2020    Speciality Comments: No specialty comments available.  Procedures:  No procedures performed Allergies: Iodinated diagnostic agents, Penicillins, Prednisone, Propoxyphene, Acetaminophen, Benzonatate, Buprenorphine hcl, Citric acid, Clindamycin, Codeine, Diazepam, Diclofenac, Esomeprazole, Morphine and related, Nsaids, Pantoprazole sodium, Ceftin [cefuroxime axetil], Cephalexin, Cortisone, Diltiazem, Doxycycline, Hydrocodone-acetaminophen, Ibuprofen, Lidocaine, Loracarbef, Macrobid [nitrofurantoin monohyd macro], Maxalt [rizatriptan], Methylprednisolone sodium succ, Naproxen, Omeprazole, Oxycodone-acetaminophen, Phenazopyridine hcl, Tomato, and Budesonide   Assessment / Plan:     Visit Diagnoses: Chronic inflammatory arthritis - RF negative, anti-CCP negative, 14 3 3  eta positive, positive mild synovitis on ultrasound examination.  Her bilateral median nerves are within normal limits: She has not had clinical synovitis.  We discussed Plaquenil at the  time but she declined.  At this point she does not want to take any medications.  I was in agreement with her.  I have not seen any progression of the disease.  Primary osteoarthritis of both hands-she continues to have some discomfort in her hands due to osteoarthritis.  Joint protection was discussed.  Myofascial pain-she has some generalized pain and positive tender points.  Stretching was discussed.  Bilateral carpal tunnel syndrome -  Nerve conduction study performed at [facility].  She was evaluated by Dr. Erlinda Hong on 05/26/20  DDD (degenerative disc disease), cervical-she has a stiffness with range of motion.  DDD (degenerative disc disease), lumbar - She was encouraged to follow up with Dr. Louanne Skye to discuss proceeding with surgery which she has been postponing.  Radiculopathy, lumbar region - Left-sided.    Chronic pain syndrome  Chronic venous insufficiency  Pituitary adenoma ([facility])  History of varicose veins  History of ovarian cyst  History of gastroesophageal reflux (GERD)  History of IBS  Coronary artery spasm ([place])  Educated about COVID-19 virus infection-patient does not want to receive the vaccination that she is concerned about multiple allergies.  Use of mask, social distancing and hand hygiene was discussed.  Orders: No orders of the defined types were placed in this encounter.  No orders of the defined types were placed in this encounter.    Follow-Up Instructions: Return in about 1 year (around 10/19/2021) for Chronic inflammatory arthritis, Osteoarthritis.   Bo Merino, MD  Note - This record has been created using Editor, commissioning.  Chart creation errors have been sought, but may not always  have been located. Such creation errors do not reflect on  the standard of medical care.

## 2020-10-19 ENCOUNTER — Encounter: Payer: Self-pay | Admitting: Rheumatology

## 2020-10-19 ENCOUNTER — Other Ambulatory Visit: Payer: Self-pay

## 2020-10-19 ENCOUNTER — Ambulatory Visit (INDEPENDENT_AMBULATORY_CARE_PROVIDER_SITE_OTHER): Payer: Medicaid Other | Admitting: Rheumatology

## 2020-10-19 VITALS — BP 102/67 | HR 73 | Resp 13 | Ht 59.75 in | Wt 140.4 lb

## 2020-10-19 DIAGNOSIS — M19041 Primary osteoarthritis, right hand: Secondary | ICD-10-CM | POA: Diagnosis not present

## 2020-10-19 DIAGNOSIS — M7918 Myalgia, other site: Secondary | ICD-10-CM | POA: Diagnosis not present

## 2020-10-19 DIAGNOSIS — Z8742 Personal history of other diseases of the female genital tract: Secondary | ICD-10-CM

## 2020-10-19 DIAGNOSIS — G5603 Carpal tunnel syndrome, bilateral upper limbs: Secondary | ICD-10-CM | POA: Diagnosis not present

## 2020-10-19 DIAGNOSIS — M5030 Other cervical disc degeneration, unspecified cervical region: Secondary | ICD-10-CM

## 2020-10-19 DIAGNOSIS — M5136 Other intervertebral disc degeneration, lumbar region: Secondary | ICD-10-CM

## 2020-10-19 DIAGNOSIS — M0609 Rheumatoid arthritis without rheumatoid factor, multiple sites: Secondary | ICD-10-CM

## 2020-10-19 DIAGNOSIS — M1990 Unspecified osteoarthritis, unspecified site: Secondary | ICD-10-CM

## 2020-10-19 DIAGNOSIS — I872 Venous insufficiency (chronic) (peripheral): Secondary | ICD-10-CM

## 2020-10-19 DIAGNOSIS — I201 Angina pectoris with documented spasm: Secondary | ICD-10-CM

## 2020-10-19 DIAGNOSIS — M5416 Radiculopathy, lumbar region: Secondary | ICD-10-CM

## 2020-10-19 DIAGNOSIS — Z8679 Personal history of other diseases of the circulatory system: Secondary | ICD-10-CM

## 2020-10-19 DIAGNOSIS — M19042 Primary osteoarthritis, left hand: Secondary | ICD-10-CM

## 2020-10-19 DIAGNOSIS — G894 Chronic pain syndrome: Secondary | ICD-10-CM

## 2020-10-19 DIAGNOSIS — D352 Benign neoplasm of pituitary gland: Secondary | ICD-10-CM

## 2020-10-19 DIAGNOSIS — Z8719 Personal history of other diseases of the digestive system: Secondary | ICD-10-CM

## 2020-10-19 DIAGNOSIS — Z7189 Other specified counseling: Secondary | ICD-10-CM

## 2020-10-21 ENCOUNTER — Other Ambulatory Visit: Payer: Self-pay

## 2020-10-21 ENCOUNTER — Ambulatory Visit (HOSPITAL_COMMUNITY)
Admission: RE | Admit: 2020-10-21 | Discharge: 2020-10-21 | Disposition: A | Discharge status: Home / Self Care | Payer: Medicaid Other | Source: Ambulatory Visit | Attending: General Surgery | Admitting: General Surgery

## 2020-10-21 ENCOUNTER — Other Ambulatory Visit (HOSPITAL_COMMUNITY): Payer: Self-pay | Admitting: Student

## 2020-10-21 DIAGNOSIS — M7989 Other specified soft tissue disorders: Secondary | ICD-10-CM | POA: Diagnosis not present

## 2020-10-21 DIAGNOSIS — M79662 Pain in left lower leg: Secondary | ICD-10-CM | POA: Diagnosis present

## 2020-10-21 DIAGNOSIS — Z9889 Other specified postprocedural states: Secondary | ICD-10-CM | POA: Insufficient documentation

## 2020-11-09 ENCOUNTER — Other Ambulatory Visit: Payer: Self-pay

## 2020-11-09 ENCOUNTER — Encounter: Payer: Self-pay | Admitting: Specialist

## 2020-11-09 ENCOUNTER — Ambulatory Visit (INDEPENDENT_AMBULATORY_CARE_PROVIDER_SITE_OTHER): Payer: Medicaid Other | Admitting: Specialist

## 2020-11-09 VITALS — BP 108/73 | HR 84 | Ht 59.75 in | Wt 140.6 lb

## 2020-11-09 DIAGNOSIS — N23 Unspecified renal colic: Secondary | ICD-10-CM | POA: Diagnosis not present

## 2020-11-09 DIAGNOSIS — M25559 Pain in unspecified hip: Secondary | ICD-10-CM | POA: Diagnosis not present

## 2020-11-09 DIAGNOSIS — M5136 Other intervertebral disc degeneration, lumbar region: Secondary | ICD-10-CM | POA: Diagnosis not present

## 2020-11-09 NOTE — Progress Notes (Signed)
Office Visit Note   Patient: Virginia Shaw           Date of Birth: [birth year]           MRN: [id number] Visit Date: 11/09/2020              Requested by: Merri BrunetteSmith, Candace, MD [phone] [address] [place],  [place] [id number] PCP: Merri BrunetteSmith, Candace, MD   Assessment & Plan: Visit Diagnoses:  1. Hip pain   2. Degenerative disc disease, lumbar   3. Kidney pain     Plan: Avoid frequent bending and stooping  No lifting greater than 10 lbs. May use ice or moist heat for pain. Weight loss is of benefit. Best medication for lumbar disc disease is arthritis medications but you can not take these meds like motrin, celebrex and naprosyn. Exercise is important to improve your indurance and does allow people to function better inspite of back pain. CBD is a consideration Injection with steroid may be of benefit. Hemp CBD capsules, amazon.com 5,000-7,000 mg per bottle, 60 capsules per bottle, take one capsule twice a day. Cane in the left hand to use with left leg weight bearing. Follow-Up Instructions: No follow-ups on file.  Referral for left hip injection for block to assess the hips contribution to your pain pattern.  Follow-Up Instructions: Return in about 3 weeks (around 11/30/2020).   Orders:  No orders of the defined types were placed in this encounter.  No orders of the defined types were placed in this encounter.     Procedures: No procedures performed   Clinical Data: No additional findings.   Subjective: Chief Complaint  Patient presents with  . Lower Back - Follow-up    55 year old female with low back pain and radiation into the left leg, left posterior thigh and calf and left groin and left lateral calf. There is pain down to the level of the ankle. She does cramp into her feet. No bowel or bladder changes. Able to walk for exercise. She does feel weak into the left leg, she fell or stumbled in the past not to the ground onto the front steps. Had new  glasses. Numbness in the left leg, On Dec 2 after her procedure she felt the muscles jumping and she could feel the muscles on the outside of the calf and the middle of the Left hamstring. The pain has worsened since early Dec and the pain is such that she does have some trouble with sleep disturbed. Awakens with leg and back pain.    Review of Systems  Constitutional: Negative.   HENT: Positive for trouble swallowing and voice change. Negative for congestion, dental problem, drooling, ear discharge, ear pain, facial swelling, hearing loss, mouth sores, nosebleeds, postnasal drip, rhinorrhea, sinus pressure, sinus pain, sneezing, sore throat and tinnitus.   Eyes: Positive for visual disturbance (glassess changed, 2019 cataract change).  Respiratory: Positive for shortness of breath. Negative for apnea, cough, choking, chest tightness, wheezing and stridor.   Cardiovascular: Positive for chest pain (Has had to take the NTG for frequent) and palpitations. Negative for leg swelling.  Gastrointestinal: Negative.  Negative for abdominal distention, abdominal pain, anal bleeding, blood in stool, constipation, diarrhea, nausea, rectal pain and vomiting.  Endocrine: Positive for cold intolerance and heat intolerance. Negative for polydipsia, polyphagia and polyuria.  Genitourinary: Negative.  Negative for difficulty urinating, dyspareunia, dysuria, enuresis, flank pain, frequency, genital sores and hematuria.  Musculoskeletal: Positive for back pain and gait problem.  Negative for arthralgias, joint swelling, myalgias, neck pain and neck stiffness.  Skin: Negative.  Negative for color change, pallor, rash and wound.  Allergic/Immunologic: Negative.  Negative for environmental allergies, food allergies and immunocompromised state.  Neurological: Positive for weakness and numbness. Negative for dizziness, seizures, syncope, facial asymmetry, speech difficulty, light-headedness and headaches.  Hematological:  Negative.  Negative for adenopathy. Does not bruise/bleed easily.  Psychiatric/Behavioral: Negative.  Negative for agitation, behavioral problems, confusion, decreased concentration, dysphoric mood, hallucinations, self-injury, sleep disturbance and suicidal ideas. The patient is not nervous/anxious and is not hyperactive.      Objective: Vital Signs: BP 108/73 (BP Location: Left Arm, Patient Position: Sitting)   Pulse 84   Ht 4' 11.75" (1.518 m)   Wt 140 lb 9.6 oz (63.8 kg)   BMI 27.69 kg/m   Physical Exam Constitutional:      Appearance: She is well-developed and well-nourished.  HENT:     Head: Normocephalic and atraumatic.  Eyes:     Extraocular Movements: EOM normal.     Pupils: Pupils are equal, round, and reactive to light.  Pulmonary:     Effort: Pulmonary effort is normal.     Breath sounds: Normal breath sounds.  Abdominal:     General: Bowel sounds are normal.     Palpations: Abdomen is soft.  Musculoskeletal:     Cervical back: Normal range of motion and neck supple.     Lumbar back: Negative right straight leg raise test and negative left straight leg raise test.  Skin:    General: Skin is warm and dry.  Neurological:     Mental Status: She is alert and oriented to person, place, and time.  Psychiatric:        Mood and Affect: Mood and affect normal.        Behavior: Behavior normal.        Thought Content: Thought content normal.        Judgment: Judgment normal.     Back Exam   Tenderness  The patient is experiencing tenderness in the lumbar.  Range of Motion  Extension: abnormal  Flexion: abnormal  Lateral bend right: abnormal  Lateral bend left: abnormal  Rotation right: abnormal  Rotation left: abnormal   Muscle Strength  Right Quadriceps:  4/5  Left Quadriceps:  4/5  Right Hamstrings:  4/5  Left Hamstrings:  4/5   Tests  Straight leg raise right: negative Straight leg raise left: negative  Reflexes  Patellar: 2/4 Achilles:  2/4  Other  Toe walk: abnormal Heel walk: abnormal Sensation: normal Erythema: no back redness Scars: absent      Specialty Comments:  No specialty comments available.  Imaging: No results found.   PMFS History: Patient Active Problem List   Diagnosis Date Noted  . Special screening for malignant neoplasms, colon 03/29/2020  . Dysphagia 12/22/2019  . DDD (degenerative disc disease), cervical 08/11/2018  . DDD (degenerative disc disease), lumbar 08/11/2018  . Somatic complaints, multiple 03/14/2017  . Chronic pain syndrome 08/25/2014  . Irritable bowel syndrome (IBS) 01/12/2014  . 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 Chronic abdominal pain 01/11/2014  . Chronic venous insufficiency 10/30/2013  . Varicose veins 10/30/2013  . Chest tightness 03/25/2012  . 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 RECTAL BLEEDING 08/26/2008  . UTI'S, RECURRENT 08/26/2008   Past Medical History:  Diagnosis Date  . Abnormal involuntary movements(781.0)   . Allergic rhinitis, cause unspecified   . Anxiety state, unspecified   . Cervical spondylosis  without myelopathy   . Chronic headaches   . Disturbance of skin sensation   . Fatty liver    per patient, "not sure but does remember being told"  . GERD (gastroesophageal reflux disease)   . History of chicken pox   . Irritable bowel syndrome   . Low blood pressure   . Lumbago   . Myalgia and myositis, unspecified   . Osteoporosis   . Other and unspecified angina pectoris   . Other malaise and fatigue   . Peripheral vascular disease (HCC)    per patient "diagnosed with it about 13-14 years ago and does not currently see someone for it, saw Dr. Connye Burkitt in the past"  . PONV (postoperative nausea and vomiting)   . 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HCC) 01/11/2014  . Shortness of breath on exertion    per patient, "sometimes depends on distance"  . Somatization disorder   . Spasm of muscle   . Systemic sclerosis (HCC)   . TIA (transient  ischemic attack)   . Unspecified hereditary and idiopathic peripheral neuropathy     Family History  Problem Relation Age of Onset  . Hypertension Father   . Hyperlipidemia Father   . Diabetes Father   . Stroke Father   . Cancer Maternal Uncle        lung  . Cancer Paternal Aunt        lung  . Migraines Mother   . Heart attack Paternal Grandmother   . Heart attack Paternal Grandfather   . Hypertension Paternal Grandfather   . Migraines Sister     Past Surgical History:  Procedure Laterality Date  . ABDOMINAL HYSTERECTOMY  1997  . arnold chiari malformation  2013  . BACK SURGERY  2001, 2011,2013   Lumbar x 2  . BALLOON DILATION N/A 12/22/2019   Procedure: BALLOON DILATION;  Surgeon: Charlott Rakes, MD;  Location: [facility];  Service: Endoscopy;  Laterality: N/A;  . barium swallow test  10/2019  . BLADDER SURGERY  2011  . CARDIAC CATHETERIZATION    . CATARACT EXTRACTION    . CERVICAL SPINE SURGERY  2013  . CHOLECYSTECTOMY    . COLONOSCOPY  2009   "Normal" Dr Jarold Motto  . COLONOSCOPY  2002   "Normal" Dr Arnaldo Natal  . COLONOSCOPY  ~2013   Dr Noe Gens.  "Chest pain with the prep" = aborted  . COLONOSCOPY WITH PROPOFOL N/A 03/29/2020   Procedure: COLONOSCOPY WITH PROPOFOL;  Surgeon: Charlott Rakes, MD;  Location: [facility];  Service: Endoscopy;  Laterality: N/A;  . CRANIECTOMY SUBOCCIPITAL W/ CERVICAL LAMINECTOMY / CHIARI  2012   Dr Jordan Likes, Chiari decompression with suboccipital craniectomy and  . CYSTOCELE REPAIR  2002   Dr Henderson Cloud  . ESOPHAGOGASTRODUODENOSCOPY (EGD) WITH PROPOFOL N/A 12/22/2019   Procedure: ESOPHAGOGASTRODUODENOSCOPY (EGD) WITH PROPOFOL;  Surgeon: Charlott Rakes, MD;  [facility];  Service: Endoscopy;  Laterality: N/A;  . INSERTION OF MESH Bilateral 10/04/2020   Procedure: INSERTION OF MESH;  Surgeon: Axel Filler, MD;  Location: [facility];  Service: General;  Laterality: Bilateral;  . OOPHORECTOMY  1999, 2000  . PITUITARY EXCISION   2010   pituitary cyst removed  . RECTOCELE REPAIR  2002   Dr. Henderson Cloud  . RENAL BIOPSY  08/04/2019  . tear duct tubes  2005  . TUBAL LIGATION    . XI ROBOTIC ASSISTED INGUINAL HERNIA REPAIR WITH MESH Bilateral 10/04/2020   Procedure: XI ROBOTIC ASSISTED BILATERAL INGUINAL HERNIA REPAIR WITH MESH;  Surgeon: Axel Filler, MD;  Location:  [facility] OR;  Service: General;  Laterality: Bilateral;   Social History   Occupational History    Employer: UNEMPLOYED  Tobacco Use  . Smoking status: Never Smoker  . Smokeless tobacco: Never Used  Vaping Use  . Vaping Use: Never used  Substance and Sexual Activity  . Alcohol use: No  . Drug use: No  . Sexual activity: Never    Birth control/protection: Abstinence

## 2020-11-09 NOTE — Patient Instructions (Signed)
Avoid frequent bending and stooping  No lifting greater than 10 lbs. May use ice or moist heat for pain. Weight loss is of benefit. Best medication for lumbar disc disease is arthritis medications but you can not take these meds like motrin, celebrex and naprosyn. Exercise is important to improve your indurance and does allow people to function better inspite of back pain. CBD is a consideration.Plan: Knee is suffering from osteoarthritis, only real proven treatments are   Injection with steroid may be of benefit. Hemp CBD capsules, amazon.com 5,000-7,000 mg per bottle, 60 capsules per bottle, take one capsule twice a day. Cane in the left hand to use with left leg weight bearing. Follow-Up Instructions: No follow-ups on file.  Referral for left hip injection for block to assess the hips contribution to your pain pattern.

## 2020-11-10 ENCOUNTER — Telehealth: Payer: Self-pay

## 2020-11-10 NOTE — Telephone Encounter (Signed)
Patient called regarding yesterdays visit she has questions about what was discussed she would like a call back: [phone]

## 2020-11-10 NOTE — Telephone Encounter (Signed)
I called and spoke with patient, ?'s about injection that was requested, I advised that he wants to use numbing medicine in the joint to see if her pain is from the hip or not, if the pain goes away with the medicine then we know its the hip, if the pain does NOT go away with the medicine its an indication it is her back, I advised that this is the easiest way to determine where the pain is coming from. Then she was asking if she had to have surgery on her back/hip should she do it before or after the Carpal tunnel release, I advised to do the CTR first as she will need her hands more for her back/hip surgery, and the CTR is easier/quicker to recover from. She states that she understands this

## 2020-11-28 ENCOUNTER — Telehealth: Payer: Self-pay

## 2020-11-28 NOTE — Telephone Encounter (Signed)
Patient called she wants to cancel appointment for 1/31 she will call back to reschedule. CB:[phone]

## 2020-11-28 NOTE — Telephone Encounter (Signed)
Appointment cancelled

## 2020-11-30 ENCOUNTER — Ambulatory Visit: Payer: Medicaid Other | Admitting: Specialist

## 2020-12-01 ENCOUNTER — Other Ambulatory Visit: Payer: Self-pay | Admitting: Physician Assistant

## 2020-12-01 DIAGNOSIS — G5602 Carpal tunnel syndrome, left upper limb: Secondary | ICD-10-CM | POA: Diagnosis not present

## 2020-12-01 MED ORDER — TRAMADOL HCL 50 MG PO TABS
50.0000 mg | ORAL_TABLET | Freq: Three times a day (TID) | ORAL | 0 refills | Status: DC | PRN
Start: 2020-12-01 — End: 2021-01-17

## 2020-12-05 ENCOUNTER — Ambulatory Visit: Payer: Medicaid Other | Admitting: Physical Medicine and Rehabilitation

## 2020-12-08 ENCOUNTER — Ambulatory Visit (INDEPENDENT_AMBULATORY_CARE_PROVIDER_SITE_OTHER): Payer: Medicaid Other | Admitting: Physician Assistant

## 2020-12-08 ENCOUNTER — Encounter: Payer: Self-pay | Admitting: Physician Assistant

## 2020-12-08 ENCOUNTER — Other Ambulatory Visit: Payer: Self-pay

## 2020-12-08 DIAGNOSIS — Z9889 Other specified postprocedural states: Secondary | ICD-10-CM

## 2020-12-08 DIAGNOSIS — G5602 Carpal tunnel syndrome, left upper limb: Secondary | ICD-10-CM

## 2020-12-08 NOTE — Progress Notes (Signed)
Post-Op Visit Note   Patient: Virginia Shaw           Date of Birth: Oct 31, 1966           MRN: [id number] Visit Date: 12/08/2020 PCP: [name], MD   Assessment & Plan:  Chief Complaint:  Chief Complaint  Patient presents with  . Left Hand - Pain   Visit Diagnoses:  1. S/P carpal tunnel release   2. Left carpal tunnel syndrome     Plan: Patient is a 55 year old female who comes in today 1 week out left carpal tunnel release. She still has numbness and tingling to her fingers as well as a sharp shooting pain on occasion. She is unable to take any pain medication due to underlying allergies. Examination of her left wrist reveals a well-healing surgical incision with nylon sutures in place. No evidence of infection or cellulitis. Today, her wound was cleaned and recovered with a Band-Aid. She was placed in a removable splint for which she will wear for the next week. She will avoid any heavy lifting or submerging her hand in water until she is 4 weeks out from surgery. Follow-up with [place] next week for suture removal. Call with concerns or questions.  Follow-Up Instructions: Return in about 1 week (around 12/15/2020).   Orders:  No orders of the defined types were placed in this encounter.  No orders of the defined types were placed in this encounter.   Imaging:  no new imaging  PMFS History: Patient Active Problem List   Diagnosis Date Noted  . Special screening for malignant neoplasms, colon 03/29/2020  . Dysphagia 12/22/2019  . DDD (degenerative disc disease), cervical 08/11/2018  . DDD (degenerative disc disease), lumbar 08/11/2018  . Somatic complaints, multiple 03/14/2017  . Chronic pain syndrome 08/25/2014  . Irritable bowel syndrome (IBS) 01/12/2014  . 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 Chronic abdominal pain 01/11/2014  . Chronic venous insufficiency 10/30/2013  . Varicose veins 10/30/2013  . Chest tightness 03/25/2012  . PITUITARY ADENOMA,  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  . RECTAL BLEEDING 08/26/2008  . UTI'S, RECURRENT 08/26/2008   Past Medical History:  Diagnosis Date  . Abnormal involuntary movements(781.0)   . Allergic rhinitis, cause unspecified   . Anxiety state, unspecified   . Cervical spondylosis without myelopathy   . Chronic headaches   . Disturbance of skin sensation   . Fatty liver    per patient, "not sure but does remember being told"  . GERD (gastroesophageal reflux disease)   . History of chicken pox   . Irritable bowel syndrome   . Low blood pressure   . Lumbago   . Myalgia and myositis, unspecified   . Osteoporosis   . Other and unspecified angina pectoris   . Other malaise and fatigue   . Peripheral vascular disease ([place])    per patient "diagnosed with it about 13-14 years ago and does not currently see someone for it, saw Dr. Vista Lawman in the past"  . PONV (postoperative nausea and vomiting)   . 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 Shortness of breath on exertion    per patient, "sometimes depends on distance"  . Somatization disorder   . Spasm of muscle   . Systemic sclerosis ([place])   . TIA (transient ischemic attack)   . Unspecified hereditary and idiopathic peripheral neuropathy     Family History  Problem Relation Age of Onset  . Hypertension Father   . Hyperlipidemia Father   . Diabetes Father   .  Stroke Father   . Cancer Maternal Uncle        lung  . Cancer Paternal Aunt        lung  . Migraines Mother   . Heart attack Paternal Grandmother   . Heart attack Paternal Grandfather   . Hypertension Paternal Grandfather   . Migraines Sister     Past Surgical History:  Procedure Laterality Date  . ABDOMINAL HYSTERECTOMY  1997  . arnold chiari malformation  2013  . BACK SURGERY  2001, 2011,2013   Lumbar x 2  . BALLOON DILATION N/A 12/22/2019   Procedure: BALLOON DILATION;  Surgeon: Wilford Corner, MD;  [facility]: [facility];  Service: Endoscopy;  Laterality: N/A;  . barium swallow test   10/2019  . BLADDER SURGERY  2011  . CARDIAC CATHETERIZATION    . CATARACT EXTRACTION    . CERVICAL SPINE SURGERY  2013  . CHOLECYSTECTOMY    . COLONOSCOPY  2009   "Normal" Dr Sharlett Iles  . COLONOSCOPY  2002   "Normal" Dr Freddrick March  . COLONOSCOPY  ~2013   Dr Ferdinand Lango.  "Chest pain with the prep" = aborted  . COLONOSCOPY WITH PROPOFOL N/A 03/29/2020   Procedure: COLONOSCOPY WITH PROPOFOL;  Surgeon: Wilford Corner, MD;  [facility];  Service: Endoscopy;  Laterality: N/A;  . CRANIECTOMY SUBOCCIPITAL W/ CERVICAL LAMINECTOMY / CHIARI  2012   Dr Annette Stable, Chiari decompression with suboccipital craniectomy and  . CYSTOCELE REPAIR  2002   Dr Gaetano Net  . ESOPHAGOGASTRODUODENOSCOPY (EGD) WITH PROPOFOL N/A 12/22/2019   Procedure: ESOPHAGOGASTRODUODENOSCOPY (EGD) WITH PROPOFOL;  Surgeon: Wilford Corner, MD;  [facility];  Service: Endoscopy;  Laterality: N/A;  . INSERTION OF MESH Bilateral 10/04/2020   Procedure: INSERTION OF MESH;  Surgeon: Ralene Ok, MD;  Location: [facility];  Service: General;  Laterality: Bilateral;  . OOPHORECTOMY  1999, 2000  . PITUITARY EXCISION  2010   pituitary cyst removed  . RECTOCELE REPAIR  2002   Dr. Gaetano Net  . RENAL BIOPSY  08/04/2019  . tear duct tubes  2005  . TUBAL LIGATION    . XI ROBOTIC ASSISTED INGUINAL HERNIA REPAIR WITH MESH Bilateral 10/04/2020   Procedure: XI ROBOTIC ASSISTED BILATERAL INGUINAL HERNIA REPAIR WITH MESH;  Surgeon: Ralene Ok, MD;  Location: [facility];  Service: General;  Laterality: Bilateral;   Social History   Occupational History    Employer: UNEMPLOYED  Tobacco Use  . Smoking status: Never Smoker  . Smokeless tobacco: Never Used  Vaping Use  . Vaping Use: Never used  Substance and Sexual Activity  . Alcohol use: No  . Drug use: No  . Sexual activity: Never    Birth control/protection: Abstinence

## 2020-12-12 ENCOUNTER — Telehealth: Payer: Self-pay | Admitting: Physical Medicine and Rehabilitation

## 2020-12-12 NOTE — Telephone Encounter (Signed)
Called pt and LVM #1 

## 2020-12-12 NOTE — Telephone Encounter (Signed)
Pt called and was calling back . So please call her

## 2020-12-15 ENCOUNTER — Ambulatory Visit (INDEPENDENT_AMBULATORY_CARE_PROVIDER_SITE_OTHER): Payer: Medicaid Other | Admitting: Physician Assistant

## 2020-12-15 ENCOUNTER — Encounter: Payer: Self-pay | Admitting: Orthopaedic Surgery

## 2020-12-15 VITALS — Ht 59.75 in | Wt 140.0 lb

## 2020-12-15 DIAGNOSIS — Z9889 Other specified postprocedural states: Secondary | ICD-10-CM

## 2020-12-15 DIAGNOSIS — G5602 Carpal tunnel syndrome, left upper limb: Secondary | ICD-10-CM

## 2020-12-15 NOTE — Progress Notes (Signed)
Post-Op Visit Note   Patient: Virginia Shaw           Date of Birth: [birth year]           MRN: [id number] Visit Date: 12/15/2020 PCP: [name], MD   Assessment & Plan:  Chief Complaint:  Chief Complaint  Patient presents with  . Left Hand - Follow-up    Left carpal tunnel release   Visit Diagnoses:  1. S/P carpal tunnel release   2. Carpal tunnel syndrome, left upper limb     Plan: Patient is a 55 year old female who comes in today 2 weeks out left carpal tunnel release.  She has been doing well.  She is having some pillar pain, but this is tolerable.  She still notes numbness, tingling and burning throughout the median nerve distribution.  No fevers or chills.  Examination of her left hand reveals a fully healed surgical scar with nylon sutures in place.  No evidence of infection or cellulitis.  Fingers are warm and well-perfused.  Today, sutures were removed and Steri-Strips applied.  No heavy lifting or submerging her hand in water for another 2 weeks.  Follow-up with [place] in 4 weeks time for recheck.  Call with concerns or questions.  Follow-Up Instructions: Return in about 4 weeks (around 01/12/2021).   Orders:  No orders of the defined types were placed in this encounter.  No orders of the defined types were placed in this encounter.   Imaging: No new imaging  PMFS History: Patient Active Problem List   Diagnosis Date Noted  . Special screening for malignant neoplasms, colon 03/29/2020  . Dysphagia 12/22/2019  . DDD (degenerative disc disease), cervical 08/11/2018  . DDD (degenerative disc disease), lumbar 08/11/2018  . Somatic complaints, multiple 03/14/2017  . Chronic pain syndrome 08/25/2014  . Irritable bowel syndrome (IBS) 01/12/2014  . 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 Chronic abdominal pain 01/11/2014  . Chronic venous insufficiency 10/30/2013  . Varicose veins 10/30/2013  . Chest tightness 03/25/2012  . PITUITARY ADENOMA, BENIGN  08/26/2008  . RECTAL BLEEDING 08/26/2008  . UTI'S, RECURRENT 08/26/2008   Past Medical History:  Diagnosis Date  . Abnormal involuntary movements(781.0)   . Allergic rhinitis, cause unspecified   . Anxiety state, unspecified   . Cervical spondylosis without myelopathy   . Chronic headaches   . Disturbance of skin sensation   . Fatty liver    per patient, "not sure but does remember being told"  . GERD (gastroesophageal reflux disease)   . History of chicken pox   . Irritable bowel syndrome   . Low blood pressure   . Lumbago   . Myalgia and myositis, unspecified   . Osteoporosis   . Other and unspecified angina pectoris   . Other malaise and fatigue   . Peripheral vascular disease ([place])    per patient "diagnosed with it about 13-14 years ago and does not currently see someone for it, saw Dr. Vista Lawman in the past"  . PONV (postoperative nausea and vomiting)   . 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 Shortness of breath on exertion    per patient, "sometimes depends on distance"  . Somatization disorder   . Spasm of muscle   . Systemic sclerosis ([place])   . TIA (transient ischemic attack)   . Unspecified hereditary and idiopathic peripheral neuropathy     Family History  Problem Relation Age of Onset  . Hypertension Father   . Hyperlipidemia Father   . Diabetes Father   .  Stroke Father   . Cancer Maternal Uncle        lung  . Cancer Paternal Aunt        lung  . Migraines Mother   . Heart attack Paternal Grandmother   . Heart attack Paternal Grandfather   . Hypertension Paternal Grandfather   . Migraines Sister     Past Surgical History:  Procedure Laterality Date  . ABDOMINAL HYSTERECTOMY  1997  . arnold chiari malformation  2013  . BACK SURGERY  2001, 2011,2013   Lumbar x 2  . BALLOON DILATION N/A 12/22/2019   Procedure: BALLOON DILATION;  Surgeon: Wilford Corner, MD;  [facility];  Service: Endoscopy;  Laterality: N/A;  . barium swallow test   10/2019  . BLADDER SURGERY  2011  . CARDIAC CATHETERIZATION    . CATARACT EXTRACTION    . CERVICAL SPINE SURGERY  2013  . CHOLECYSTECTOMY    . COLONOSCOPY  2009   "Normal" Dr Sharlett Iles  . COLONOSCOPY  2002   "Normal" Dr Freddrick March  . COLONOSCOPY  ~2013   Dr Ferdinand Lango.  "Chest pain with the prep" = aborted  . COLONOSCOPY WITH PROPOFOL N/A 03/29/2020   Procedure: COLONOSCOPY WITH PROPOFOL;  Surgeon: Wilford Corner, MD;  [facility];  Service: Endoscopy;  Laterality: N/A;  . CRANIECTOMY SUBOCCIPITAL W/ CERVICAL LAMINECTOMY / CHIARI  2012   Dr Annette Stable, Chiari decompression with suboccipital craniectomy and  . CYSTOCELE REPAIR  2002   Dr Gaetano Net  . ESOPHAGOGASTRODUODENOSCOPY (EGD) WITH PROPOFOL N/A 12/22/2019   Procedure: ESOPHAGOGASTRODUODENOSCOPY (EGD) WITH PROPOFOL;  Surgeon: Wilford Corner, MD;  [facility];  Service: Endoscopy;  Laterality: N/A;  . INSERTION OF MESH Bilateral 10/04/2020   Procedure: INSERTION OF MESH;  Surgeon: Ralene Ok, MD;  Location: [facility];  Service: General;  Laterality: Bilateral;  . OOPHORECTOMY  1999, 2000  . PITUITARY EXCISION  2010   pituitary cyst removed  . RECTOCELE REPAIR  2002   Dr. Gaetano Net  . RENAL BIOPSY  08/04/2019  . tear duct tubes  2005  . TUBAL LIGATION    . XI ROBOTIC ASSISTED INGUINAL HERNIA REPAIR WITH MESH Bilateral 10/04/2020   Procedure: XI ROBOTIC ASSISTED BILATERAL INGUINAL HERNIA REPAIR WITH MESH;  Surgeon: Ralene Ok, MD;  Location: [facility];  Service: General;  Laterality: Bilateral;   Social History   Occupational History    Employer: UNEMPLOYED  Tobacco Use  . Smoking status: Never Smoker  . Smokeless tobacco: Never Used  Vaping Use  . Vaping Use: Never used  Substance and Sexual Activity  . Alcohol use: No  . Drug use: No  . Sexual activity: Never    Birth control/protection: Abstinence

## 2020-12-16 NOTE — Telephone Encounter (Signed)
Pt has appt with Dr. Louanne Skye.

## 2020-12-22 ENCOUNTER — Ambulatory Visit: Payer: Medicaid Other | Admitting: Specialist

## 2020-12-22 ENCOUNTER — Encounter: Payer: Self-pay | Admitting: Specialist

## 2020-12-22 VITALS — BP 104/72 | HR 79 | Ht 59.75 in | Wt 141.0 lb

## 2020-12-22 DIAGNOSIS — M5136 Other intervertebral disc degeneration, lumbar region: Secondary | ICD-10-CM | POA: Diagnosis not present

## 2020-12-22 DIAGNOSIS — M7072 Other bursitis of hip, left hip: Secondary | ICD-10-CM

## 2020-12-22 DIAGNOSIS — M545 Low back pain, unspecified: Secondary | ICD-10-CM | POA: Diagnosis not present

## 2020-12-22 NOTE — Patient Instructions (Addendum)
Avoid bending, stooping and avoid lifting weights greater than 10 lbs. Avoid prolong standing and walking. Avoid frequent bending and stooping  No lifting greater than 10 lbs. May use ice or moist heat for pain. Weight loss is of benefit. Handicap license is approved. Dr. Romona Curls secretary/Assistant will call to arrange for Left L4 and L5 selective nerve root blocks.  Now that the hernias have been repaired therapy for the lumbar spine involving strengthening the abdomenal muscles may be more effective so if your general surgeons agree doing PT again may be worth considering.

## 2020-12-22 NOTE — Progress Notes (Addendum)
Office Visit Note   Patient: Virginia Shaw           Date of Birth: November 12, 1965           MRN: [id number] Visit Date: 12/22/2020              Requested by: [name], [place],  [place] [address] PCP: [name], MD   Assessment & Plan: Visit Diagnoses:  1. Degenerative disc disease, lumbar   2. Low back pain without sciatica, unspecified back pain laterality, unspecified chronicity   3. Ischial bursitis, left     Plan:Avoid bending, stooping and avoid lifting weights greater than 10 lbs. Avoid prolong standing and walking. Avoid frequent bending and stooping  No lifting greater than 10 lbs. May use ice or moist heat for pain. Weight loss is of benefit. Handicap license is approved. Dr. Romona Curls secretary/Assistant will call to arrange for Left L4 and L5 selective nerve root blocks.  Now that the hernias have been repaired therapy for the lumbar spine involving strengthening the abdomenal muscles may be more effective so if your general surgeons agree doing PT again may be worth considering.  Follow-Up Instructions: No follow-ups on file.   Orders:  No orders of the defined types were placed in this encounter.  No orders of the defined types were placed in this encounter.     Procedures: No procedures performed   Clinical Data: No additional findings.   Subjective: Chief Complaint  Patient presents with  . Lower Back - Follow-up, Pain  . Left Hip - Follow-up, Pain    55 year old female with history of left buttock pain and radiation into the left anterior thigh and left anterolateral calf. Pain is present when she sits on the left leg left buttock. The pain is present and sometime there is numbness that causes whole left leg weakness and numbness. Such that she can not move the left leg over and roll over even in bed. She is not sure that she can walk a mile, daughter does grocery shopping. She does go to department  stores. Standing and walking is painful and she has to sit and take breaks. She is having bladder issues and presently has blood in her urine, this sometimes happens after procedures like the CTR. She has thinning if  Bladder lining and meds may contribute to this. She underwent left carpal tunnel release 1/2   Review of Systems  Constitutional: Negative.   HENT: Negative.   Eyes: Negative.   Respiratory: Negative.   Cardiovascular: Negative.   Gastrointestinal: Negative.   Endocrine: Negative.   Genitourinary: Negative.   Musculoskeletal: Negative.   Skin: Negative.   Allergic/Immunologic: Negative.   Neurological: Negative.   Hematological: Negative.   Psychiatric/Behavioral: Negative.      Objective: Vital Signs: BP 104/72 (BP Location: Left Arm, Patient Position: Sitting)   Pulse 79   Ht 4' 11.75" (1.518 m)   Wt 141 lb (64 kg)   BMI 27.77 kg/m   Physical Exam Constitutional:      Appearance: She is well-developed and well-nourished.  HENT:     Head: Normocephalic and atraumatic.  Eyes:     Extraocular Movements: EOM normal.     Pupils: Pupils are equal, round, and reactive to light.  Pulmonary:     Effort: Pulmonary effort is normal.     Breath sounds: Normal breath sounds.  Abdominal:     General: Bowel sounds are normal.  Palpations: Abdomen is soft.  Musculoskeletal:     Cervical back: Normal range of motion and neck supple.     Lumbar back: Negative right straight leg raise test and negative left straight leg raise test.  Skin:    General: Skin is warm and dry.  Neurological:     Mental Status: She is alert and oriented to person, place, and time.  Psychiatric:        Mood and Affect: Mood and affect normal.        Behavior: Behavior normal.        Thought Content: Thought content normal.        Judgment: Judgment normal.     Back Exam   Tenderness  The patient is experiencing tenderness in the lumbar and sacroiliac.  Range of Motion   Extension: abnormal  Flexion: normal  Lateral bend right: normal  Lateral bend left: normal  Rotation right: normal  Rotation left: normal   Muscle Strength  Right Quadriceps:  5/5  Left Quadriceps:  5/5  Right Hamstrings:  5/5  Left Hamstrings:  5/5   Tests  Straight leg raise right: negative Straight leg raise left: negative  Other  Toe walk: normal Heel walk: normal Sensation: normal Scars: present  Comments:  Left greater trochanter is tender as is the left ischial tuberosity.      Specialty Comments:  No specialty comments available.  Imaging: No results found.   PMFS History: Patient Active Problem List   Diagnosis Date Noted  . Special screening for malignant neoplasms, colon 03/29/2020  . Dysphagia 12/22/2019  . DDD (degenerative disc disease), cervical 08/11/2018  . DDD (degenerative disc disease), lumbar 08/11/2018  . Somatic complaints, multiple 03/14/2017  . Chronic pain syndrome 08/25/2014  . Irritable bowel syndrome (IBS) 01/12/2014  . 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 Chronic abdominal pain 01/11/2014  . Chronic venous insufficiency 10/30/2013  . Varicose veins 10/30/2013  . Chest tightness 03/25/2012  . 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 RECTAL BLEEDING 08/26/2008  . UTI'S, RECURRENT 08/26/2008   Past Medical History:  Diagnosis Date  . Abnormal involuntary movements(781.0)   . Allergic rhinitis, cause unspecified   . Anxiety state, unspecified   . Cervical spondylosis without myelopathy   . Chronic headaches   . Disturbance of skin sensation   . Fatty liver    per patient, "not sure but does remember being told"  . GERD (gastroesophageal reflux disease)   . History of chicken pox   . Irritable bowel syndrome   . Low blood pressure   . Lumbago   . Myalgia and myositis, unspecified   . Osteoporosis   . Other and unspecified angina pectoris   . Other malaise and fatigue   . Peripheral vascular disease ([place])    per  patient "diagnosed with it about 13-14 years ago and does not currently see someone for it, saw Dr. Vista Lawman in the past"  . PONV (postoperative nausea and vomiting)   . 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 Shortness of breath on exertion    per patient, "sometimes depends on distance"  . Somatization disorder   . Spasm of muscle   . Systemic sclerosis ([place])   . TIA (transient ischemic attack)   . Unspecified hereditary and idiopathic peripheral neuropathy     Family History  Problem Relation Age of Onset  . Hypertension Father   . Hyperlipidemia Father   . Diabetes Father   . Stroke Father   . Cancer Maternal  Uncle        lung  . Cancer Paternal Aunt        lung  . Migraines Mother   . Heart attack Paternal Grandmother   . Heart attack Paternal Grandfather   . Hypertension Paternal Grandfather   . Migraines Sister     Past Surgical History:  Procedure Laterality Date  . ABDOMINAL HYSTERECTOMY  1997  . arnold chiari malformation  2013  . BACK SURGERY  2001, 2011,2013   Lumbar x 2  . BALLOON DILATION N/A 12/22/2019   Procedure: BALLOON DILATION;  Surgeon: Wilford Corner, MD;  Location: [facility];  Service: Endoscopy;  Laterality: N/A;  . barium swallow test  10/2019  . BLADDER SURGERY  2011  . CARDIAC CATHETERIZATION    . CATARACT EXTRACTION    . CERVICAL SPINE SURGERY  2013  . CHOLECYSTECTOMY    . COLONOSCOPY  2009   "Normal" Dr Sharlett Iles  . COLONOSCOPY  2002   "Normal" Dr Freddrick March  . COLONOSCOPY  ~2013   Dr Ferdinand Lango.  "Chest pain with the prep" = aborted  . COLONOSCOPY WITH PROPOFOL N/A 03/29/2020   Procedure: COLONOSCOPY WITH PROPOFOL;  Surgeon: Wilford Corner, MD;  Location: [facility];  Service: Endoscopy;  Laterality: N/A;  . CRANIECTOMY SUBOCCIPITAL W/ CERVICAL LAMINECTOMY / CHIARI  2012   Dr Annette Stable, Chiari decompression with suboccipital craniectomy and  . CYSTOCELE REPAIR  2002   Dr Gaetano Net  . ESOPHAGOGASTRODUODENOSCOPY (EGD) WITH PROPOFOL N/A  12/22/2019   Procedure: ESOPHAGOGASTRODUODENOSCOPY (EGD) WITH PROPOFOL;  Surgeon: Wilford Corner, MD;  [facility]: [facility];  Service: Endoscopy;  Laterality: N/A;  . INSERTION OF MESH Bilateral 10/04/2020   Procedure: INSERTION OF MESH;  Surgeon: Ralene Ok, MD;  Location: [facility];  Service: General;  Laterality: Bilateral;  . OOPHORECTOMY  1999, 2000  . PITUITARY EXCISION  2010   pituitary cyst removed  . RECTOCELE REPAIR  2002   Dr. Gaetano Net  . RENAL BIOPSY  08/04/2019  . tear duct tubes  2005  . TUBAL LIGATION    . XI ROBOTIC ASSISTED INGUINAL HERNIA REPAIR WITH MESH Bilateral 10/04/2020   Procedure: XI ROBOTIC ASSISTED BILATERAL INGUINAL HERNIA REPAIR WITH MESH;  Surgeon: Ralene Ok, MD;  Location: [facility];  Service: General;  Laterality: Bilateral;   Social History   Occupational History    Employer: UNEMPLOYED  Tobacco Use  . Smoking status: Never Smoker  . Smokeless tobacco: Never Used  Vaping Use  . Vaping Use: Never used  Substance and Sexual Activity  . Alcohol use: No  . Drug use: No  . Sexual activity: Never    Birth control/protection: Abstinence

## 2021-01-04 ENCOUNTER — Telehealth: Payer: Self-pay | Admitting: Specialist

## 2021-01-04 NOTE — Telephone Encounter (Signed)
Patient called. She is ready to start physical therapy. Her call back number is [phone]

## 2021-01-05 ENCOUNTER — Other Ambulatory Visit: Payer: Self-pay | Admitting: Radiology

## 2021-01-05 DIAGNOSIS — M545 Low back pain, unspecified: Secondary | ICD-10-CM

## 2021-01-05 DIAGNOSIS — M5136 Other intervertebral disc degeneration, lumbar region: Secondary | ICD-10-CM

## 2021-01-05 NOTE — Telephone Encounter (Signed)
I called patient and she would like to go to [facility] in [place], [place].  I placed this order.

## 2021-01-12 ENCOUNTER — Encounter: Payer: Self-pay | Admitting: Orthopaedic Surgery

## 2021-01-12 ENCOUNTER — Other Ambulatory Visit: Payer: Self-pay

## 2021-01-12 ENCOUNTER — Ambulatory Visit (INDEPENDENT_AMBULATORY_CARE_PROVIDER_SITE_OTHER): Payer: Medicaid Other | Admitting: Orthopaedic Surgery

## 2021-01-12 VITALS — Ht 59.75 in | Wt 141.0 lb

## 2021-01-12 DIAGNOSIS — Z9889 Other specified postprocedural states: Secondary | ICD-10-CM

## 2021-01-12 NOTE — Progress Notes (Signed)
Post-Op Visit Note   Patient: Virginia Shaw           Date of Birth: 07-18-1966           MRN: [id number] Visit Date: 01/12/2021 PCP: [name], MD   Assessment & Plan:  Chief Complaint:  Chief Complaint  Patient presents with  . Left Wrist - Follow-up    S/p Left carpal tunnel release    Visit Diagnoses:  1. S/P carpal tunnel release     Plan:   Virginia Shaw is 6 weeks status post left carpal tunnel release.  She no longer takes any pain medication.  She has some discomfort around the surgical scar.  Overall her carpal tunnel symptoms have greatly improved.  She still has a little numbness in the fingertips.  Surgical scar is fully healed.  She is able to make a full composite fist.  Slight tenderness ulnar to the surgical scar no signs of infection.  No neurovascular compromise.  She will continue to work on scar desensitization and increase use of the hand as tolerated.  Wearing the carpal tunnel splint at night right next couple weeks to see if this will help with some of her residual symptoms.  Otherwise we will see her back as needed.  Follow-Up Instructions: Return if symptoms worsen or fail to improve.   Orders:  No orders of the defined types were placed in this encounter.  No orders of the defined types were placed in this encounter.   Imaging: No results found.  PMFS History: Patient Active Problem List   Diagnosis Date Noted  . Special screening for malignant neoplasms, colon 03/29/2020  . Dysphagia 12/22/2019  . DDD (degenerative disc disease), cervical 08/11/2018  . DDD (degenerative disc disease), lumbar 08/11/2018  . Somatic complaints, multiple 03/14/2017  . Chronic pain syndrome 08/25/2014  . Irritable bowel syndrome (IBS) 01/12/2014  . 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 Chronic abdominal pain 01/11/2014  . Chronic venous insufficiency 10/30/2013  . Varicose veins 10/30/2013  . Chest tightness 03/25/2012  . PITUITARY ADENOMA,  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  . RECTAL BLEEDING 08/26/2008  . UTI'S, RECURRENT 08/26/2008   Past Medical History:  Diagnosis Date  . Abnormal involuntary movements(781.0)   . Allergic rhinitis, cause unspecified   . Anxiety state, unspecified   . Cervical spondylosis without myelopathy   . Chronic headaches   . Disturbance of skin sensation   . Fatty liver    per patient, "not sure but does remember being told"  . GERD (gastroesophageal reflux disease)   . History of chicken pox   . Irritable bowel syndrome   . Low blood pressure   . Lumbago   . Myalgia and myositis, unspecified   . Osteoporosis   . Other and unspecified angina pectoris   . Other malaise and fatigue   . Peripheral vascular disease ([place])    per patient "diagnosed with it about 13-14 years ago and does not currently see someone for it, saw Dr. Vista Lawman in the past"  . PONV (postoperative nausea and vomiting)   . 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 Shortness of breath on exertion    per patient, "sometimes depends on distance"  . Somatization disorder   . Spasm of muscle   . Systemic sclerosis ([place])   . TIA (transient ischemic attack)   . Unspecified hereditary and idiopathic peripheral neuropathy     Family History  Problem Relation Age of Onset  . Hypertension Father   . Hyperlipidemia Father   .  Diabetes Father   . Stroke Father   . Cancer Maternal Uncle        lung  . Cancer Paternal Aunt        lung  . Migraines Mother   . Heart attack Paternal Grandmother   . Heart attack Paternal Grandfather   . Hypertension Paternal Grandfather   . Migraines Sister     Past Surgical History:  Procedure Laterality Date  . ABDOMINAL HYSTERECTOMY  1997  . arnold chiari malformation  2013  . BACK SURGERY  2001, 2011,2013   Lumbar x 2  . BALLOON DILATION N/A 12/22/2019   Procedure: BALLOON DILATION;  Surgeon: Wilford Corner, MD;  [facility];  Service: Endoscopy;  Laterality: N/A;  . barium swallow test   10/2019  . BLADDER SURGERY  2011  . CARDIAC CATHETERIZATION    . CATARACT EXTRACTION    . CERVICAL SPINE SURGERY  2013  . CHOLECYSTECTOMY    . COLONOSCOPY  2009   "Normal" Dr Sharlett Iles  . COLONOSCOPY  2002   "Normal" Dr Freddrick March  . COLONOSCOPY  ~2013   Dr Ferdinand Lango.  "Chest pain with the prep" = aborted  . COLONOSCOPY WITH PROPOFOL N/A 03/29/2020   Procedure: COLONOSCOPY WITH PROPOFOL;  Surgeon: Wilford Corner, MD;  [facility];  Service: Endoscopy;  Laterality: N/A;  . CRANIECTOMY SUBOCCIPITAL W/ CERVICAL LAMINECTOMY / CHIARI  2012   Dr Annette Stable, Chiari decompression with suboccipital craniectomy and  . CYSTOCELE REPAIR  2002   Dr Gaetano Net  . ESOPHAGOGASTRODUODENOSCOPY (EGD) WITH PROPOFOL N/A 12/22/2019   Procedure: ESOPHAGOGASTRODUODENOSCOPY (EGD) WITH PROPOFOL;  Surgeon: Wilford Corner, MD;  [facility];  Service: Endoscopy;  Laterality: N/A;  . INSERTION OF MESH Bilateral 10/04/2020   Procedure: INSERTION OF MESH;  Surgeon: Ralene Ok, MD;  Location: [facility];  Service: General;  Laterality: Bilateral;  . OOPHORECTOMY  1999, 2000  . PITUITARY EXCISION  2010   pituitary cyst removed  . RECTOCELE REPAIR  2002   Dr. Gaetano Net  . RENAL BIOPSY  08/04/2019  . tear duct tubes  2005  . TUBAL LIGATION    . XI ROBOTIC ASSISTED INGUINAL HERNIA REPAIR WITH MESH Bilateral 10/04/2020   Procedure: XI ROBOTIC ASSISTED BILATERAL INGUINAL HERNIA REPAIR WITH MESH;  Surgeon: Ralene Ok, MD;  Location: [facility];  Service: General;  Laterality: Bilateral;   Social History   Occupational History    Employer: UNEMPLOYED  Tobacco Use  . Smoking status: Never Smoker  . Smokeless tobacco: Never Used  Vaping Use  . Vaping Use: Never used  Substance and Sexual Activity  . Alcohol use: No  . Drug use: No  . Sexual activity: Never    Birth control/protection: Abstinence

## 2021-01-16 ENCOUNTER — Telehealth: Payer: Self-pay | Admitting: Orthopaedic Surgery

## 2021-01-16 NOTE — Telephone Encounter (Signed)
Pt called and was very upset about TRAMADOL being on her medication list because she is not taking that medication. She would like for it to be taken off of her chart.

## 2021-01-17 ENCOUNTER — Telehealth: Payer: Self-pay

## 2021-01-17 NOTE — Telephone Encounter (Signed)
Rx for tramadol was removed from chart. She never picked up Rx from her pharm

## 2021-01-17 NOTE — Telephone Encounter (Signed)
Called pharm. She never picked up Rx. Canceled order.

## 2021-01-17 NOTE — Telephone Encounter (Signed)
I think I was able to remove it from chart.

## 2021-01-17 NOTE — Telephone Encounter (Signed)
I'm not sure how we correct this?  It sounds like we called it in to take after surgery but she never picked up?

## 2021-02-02 ENCOUNTER — Encounter: Payer: Self-pay | Admitting: Specialist

## 2021-02-02 ENCOUNTER — Other Ambulatory Visit: Payer: Self-pay

## 2021-02-02 ENCOUNTER — Ambulatory Visit: Payer: Medicaid Other | Admitting: Specialist

## 2021-02-02 ENCOUNTER — Ambulatory Visit: Payer: Self-pay

## 2021-02-02 VITALS — BP 107/75 | HR 80 | Ht 59.75 in | Wt 141.0 lb

## 2021-02-02 DIAGNOSIS — R29898 Other symptoms and signs involving the musculoskeletal system: Secondary | ICD-10-CM | POA: Diagnosis not present

## 2021-02-02 DIAGNOSIS — M25452 Effusion, left hip: Secondary | ICD-10-CM | POA: Diagnosis not present

## 2021-02-02 NOTE — Progress Notes (Signed)
Office Visit Note   Patient: Virginia Shaw           Date of Birth: 07/31/1966           MRN: [id number] Visit Date: 02/02/2021              Requested by: [name], [name],  [place] [id number] PCP: [name], MD   Assessment & Plan: Visit Diagnoses:  1. Swelling of joint of pelvic region or thigh, left   2. Weakness of left leg     Plan:Avoid frequent bending and stooping  No lifting greater than 10 lbs. May use ice or moist heat for pain. Weight loss is of benefit. Best medication for lumbar disc disease is arthritis medications like motrin, celebrex and naprosyn. Exercise is important to improve your indurance and does allow people to function better inspite of back pain. EMG/NCV of the left leg ordered. Radiographs and left hip MRI are negative. Swelling is perhaps due to neurogenic reason, EMG will help [place] to understand.    Follow-Up Instructions: No follow-ups on file.   Orders:  Orders Placed This Encounter  Procedures  . XR FEMUR MIN 2 VIEWS LEFT  . Ambulatory referral to Physical Medicine Rehab   No orders of the defined types were placed in this encounter.     Procedures: No procedures performed   Clinical Data: Findings:   Study Result  Narrative & Impression CLINICAL DATA:  Back pain radiating to the left groin.  EXAM: MRI LUMBAR SPINE WITHOUT CONTRAST  TECHNIQUE: Multiplanar, multisequence MR imaging of the lumbar spine was performed. No intravenous contrast was administered.  COMPARISON:  CT scan 08/07/2019, lumbar radiographs 06/01/2019, and lumbar MRI from 01/03/2017.  FINDINGS: Segmentation: The lowest lumbar type non-rib-bearing vertebra is labeled as L5.  Alignment:  3 mm degenerative retrolisthesis at L4-5.  Vertebrae: Disc desiccation at L4-5 and L5-S1 with loss of disc height at both levels.  Conus medullaris and cauda equina: Conus extends to the L1 level. Conus and  cauda equina appear normal.  Paraspinal and other soft tissues: Left kidney lower pole perinephric hematoma is again observed with accentuated T1 signal and intermediate T2 signal. This does not appear larger than it did on 08/07/2019.  Disc levels:  T12-L1: Unremarkable.  L1-2: Unremarkable.  L2-3: Unremarkable.  L3-4: Unremarkable.  L4-5: Borderline left foraminal stenosis due to facet arthropathy, disc bulge, small left lateral recess disc protrusion, and mild intervertebral spurring. The bulge abuts the left L5 nerve roots in the lateral recess does not appear to significantly compress the nerve roots. Left laminectomy noted.  L5-S1: Mild bilateral foraminal stenosis due to intervertebral and facet spurring along with mild disc bulge. Findings at this level are stable.  IMPRESSION: 1. Lumbar spondylosis and degenerative disc disease leading to mild impingement in the neural foramina at L5-S1, and borderline impingement at L4-5, not appreciably changed from the 01/03/2017 exam. 2. Essentially stable appearance of the left kidney lower pole perinephric hematoma.   Electronically Signed   By: Van Clines M.D.   On: 08/11/2019 09:19       Subjective: Chief Complaint  Patient presents with  . Lower Back - Follow-up, Pain    States it is not any better, went to PT and he had trying a breathing technique and it has made her back, left hip/groin and ribs hurt worse.    55 year old female with persistant pain into the left anterolateral thigh and  in to the left calf laterally when she is standing and walking. She gets pain that radiates into the left anterolateral thigh to the left lateral suprapatella area. She had left inquinal hernia repain by Dr. Emogene Shaw in 09/2020. He felt than the hernia was mild to moderate and he performed surgery on both sides and was discharged home within hours laproscopic and she had assistance from her daughter to help  her. She is watching risks when no one is around. No falls though she notes that she is alittle bit weaker. She has bladder difficulty with irritable bladder with hematuria and she normally sees Dr. Amalia Shaw at [facility] and is to see a new nurse practioner, Dr. Ellard Shaw has done surgery on her bladder years ago.    Review of Systems  Constitutional: Negative.   HENT: Negative.   Eyes: Negative.   Respiratory: Negative.   Cardiovascular: Negative.   Gastrointestinal: Negative.   Endocrine: Negative.   Genitourinary: Negative.   Musculoskeletal: Negative.   Skin: Negative.   Allergic/Immunologic: Negative.   Neurological: Negative.   Hematological: Negative.   Psychiatric/Behavioral: Negative.      Objective: Vital Signs: BP 107/75 (BP Location: Left Arm, Patient Position: Sitting)   Pulse 80   Ht 4' 11.75" (1.518 m)   Wt 141 lb (64 kg)   BMI 27.77 kg/m   Physical Exam Constitutional:      Appearance: She is well-developed.  HENT:     Head: Normocephalic and atraumatic.  Eyes:     Pupils: Pupils are equal, round, and reactive to light.  Pulmonary:     Effort: Pulmonary effort is normal.     Breath sounds: Normal breath sounds.  Abdominal:     General: Bowel sounds are normal.     Palpations: Abdomen is soft.  Musculoskeletal:        General: Normal range of motion.     Cervical back: Normal range of motion and neck supple.  Skin:    General: Skin is warm and dry.  Neurological:     Mental Status: She is alert and oriented to person, place, and time.  Psychiatric:        Behavior: Behavior normal.        Thought Content: Thought content normal.        Judgment: Judgment normal.     Back Exam   Tenderness  The patient is experiencing tenderness in the lumbar.  Muscle Strength  Left Quadriceps:  4/5  Left Hamstrings:  5/5   Reflexes  Patellar: 3/4  Comments:  Weak left foot DF and left knee extension.      Specialty Comments:  No specialty comments  available.  Imaging: No results found.   PMFS History: Patient Active Problem List   Diagnosis Date Noted  . Special screening for malignant neoplasms, colon 03/29/2020  . Dysphagia 12/22/2019  . DDD (degenerative disc disease), cervical 08/11/2018  . DDD (degenerative disc disease), lumbar 08/11/2018  . Somatic complaints, multiple 03/14/2017  . Chronic pain syndrome 08/25/2014  . Irritable bowel syndrome (IBS) 01/12/2014  . 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 Chronic abdominal pain 01/11/2014  . Chronic venous insufficiency 10/30/2013  . Varicose veins 10/30/2013  . Chest tightness 03/25/2012  . 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 RECTAL BLEEDING 08/26/2008  . UTI'S, RECURRENT 08/26/2008   Past Medical History:  Diagnosis Date  . Abnormal involuntary movements(781.0)   . Allergic rhinitis, cause unspecified   . Anxiety state, unspecified   . Cervical spondylosis without  myelopathy   . Chronic headaches   . Disturbance of skin sensation   . Fatty liver    per patient, "not sure but does remember being told"  . GERD (gastroesophageal reflux disease)   . History of chicken pox   . Irritable bowel syndrome   . Low blood pressure   . Lumbago   . Myalgia and myositis, unspecified   . Osteoporosis   . Other and unspecified angina pectoris   . Other malaise and fatigue   . Peripheral vascular disease ([place])    per patient "diagnosed with it about 13-14 years ago and does not currently see someone for it, saw Dr. Vista Lawman in the past"  . PONV (postoperative nausea and vomiting)   . 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 Shortness of breath on exertion    per patient, "sometimes depends on distance"  . Somatization disorder   . Spasm of muscle   . Systemic sclerosis ([place])   . TIA (transient ischemic attack)   . Unspecified hereditary and idiopathic peripheral neuropathy     Family History  Problem Relation Age of Onset  . Hypertension Father   .  Hyperlipidemia Father   . Diabetes Father   . Stroke Father   . Cancer Maternal Uncle        lung  . Cancer Paternal Aunt        lung  . Migraines Mother   . Heart attack Paternal Grandmother   . Heart attack Paternal Grandfather   . Hypertension Paternal Grandfather   . Migraines Sister     Past Surgical History:  Procedure Laterality Date  . ABDOMINAL HYSTERECTOMY  1997  . arnold chiari malformation  2013  . BACK SURGERY  2001, 2011,2013   Lumbar x 2  . BALLOON DILATION N/A 12/22/2019   Procedure: BALLOON DILATION;  Surgeon: Wilford Corner, MD;  Location: [facility];  Service: Endoscopy;  Laterality: N/A;  . barium swallow test  10/2019  . BLADDER SURGERY  2011  . CARDIAC CATHETERIZATION    . CATARACT EXTRACTION    . CERVICAL SPINE SURGERY  2013  . CHOLECYSTECTOMY    . COLONOSCOPY  2009   "Normal" Dr Sharlett Iles  . COLONOSCOPY  2002   "Normal" Dr Freddrick March  . COLONOSCOPY  ~2013   Dr Ferdinand Lango.  "Chest pain with the prep" = aborted  . COLONOSCOPY WITH PROPOFOL N/A 03/29/2020   Procedure: COLONOSCOPY WITH PROPOFOL;  Surgeon: Wilford Corner, MD;  Location: [facility];  Service: Endoscopy;  Laterality: N/A;  . CRANIECTOMY SUBOCCIPITAL W/ CERVICAL LAMINECTOMY / CHIARI  2012   Dr Annette Stable, Chiari decompression with suboccipital craniectomy and  . CYSTOCELE REPAIR  2002   Dr Gaetano Net  . ESOPHAGOGASTRODUODENOSCOPY (EGD) WITH PROPOFOL N/A 12/22/2019   Procedure: ESOPHAGOGASTRODUODENOSCOPY (EGD) WITH PROPOFOL;  Surgeon: Wilford Corner, MD;  Location: [facility];  Service: Endoscopy;  Laterality: N/A;  . INSERTION OF MESH Bilateral 10/04/2020   Procedure: INSERTION OF MESH;  Surgeon: Ralene Ok, MD;  Location: [facility];  Service: General;  Laterality: Bilateral;  . OOPHORECTOMY  1999, 2000  . PITUITARY EXCISION  2010   pituitary cyst removed  . RECTOCELE REPAIR  2002   Dr. Gaetano Net  . RENAL BIOPSY  08/04/2019  . tear duct tubes  2005  . TUBAL LIGATION    . XI ROBOTIC  ASSISTED INGUINAL HERNIA REPAIR WITH MESH Bilateral 10/04/2020   Procedure: XI ROBOTIC ASSISTED BILATERAL INGUINAL HERNIA REPAIR WITH MESH;  Surgeon: Ralene Ok, MD;  Location: [facility]  OR;  Service: General;  Laterality: Bilateral;   Social History   Occupational History    Employer: UNEMPLOYED  Tobacco Use  . Smoking status: Never Smoker  . Smokeless tobacco: Never Used  Vaping Use  . Vaping Use: Never used  Substance and Sexual Activity  . Alcohol use: No  . Drug use: No  . Sexual activity: Never    Birth control/protection: Abstinence

## 2021-02-02 NOTE — Patient Instructions (Signed)
Plan:Avoid frequent bending and stooping  No lifting greater than 10 lbs. May use ice or moist heat for pain. Weight loss is of benefit. Best medication for lumbar disc disease is arthritis medications like motrin, celebrex and naprosyn. Exercise is important to improve your indurance and does allow people to function better inspite of back pain. EMG/NCV of the left leg ordered. Radiographs and left hip MRI are negative. Swelling is perhaps due to neurogenic reason, EMG will help [place] to understand.

## 2021-02-23 ENCOUNTER — Telehealth: Payer: Self-pay | Admitting: Physical Medicine and Rehabilitation

## 2021-02-23 NOTE — Telephone Encounter (Signed)
Called pt and kept appt.

## 2021-02-23 NOTE — Telephone Encounter (Signed)
Patient called needing to R/S her appointment with Dr. Ernestina Patches. Patient want to reschedule the appointment after 03/15/2021. The number to contact patient is [phone]

## 2021-03-08 ENCOUNTER — Ambulatory Visit: Payer: Medicaid Other | Admitting: Specialist

## 2021-03-16 ENCOUNTER — Encounter: Payer: Self-pay | Admitting: Physical Medicine and Rehabilitation

## 2021-03-16 ENCOUNTER — Ambulatory Visit (INDEPENDENT_AMBULATORY_CARE_PROVIDER_SITE_OTHER): Payer: Medicaid Other | Admitting: Physical Medicine and Rehabilitation

## 2021-03-16 ENCOUNTER — Other Ambulatory Visit: Payer: Self-pay

## 2021-03-16 DIAGNOSIS — R202 Paresthesia of skin: Secondary | ICD-10-CM

## 2021-03-16 NOTE — Progress Notes (Signed)
Pt state lower back pain that travels to her left side down her leg to the ankle. Pt state Walking and laying down makes the pain worse. Pt state she feels sharp pain, numbness and weakness in her leg. Pt state the pain wakes her up at night.   Numeric Pain Rating Scale and Functional Assessment Average Pain 7   In the last MONTH (on 0-10 scale) has pain interfered with the following?  1. General activity like being  able to carry out your everyday physical activities such as walking, climbing stairs, carrying groceries, or moving a chair?  Rating(8)

## 2021-03-17 NOTE — Procedures (Signed)
EMG & NCV Findings: All nerve conduction studies (as indicated in the following tables) were within normal limits.    All examined muscles (as indicated in the following table) showed no evidence of electrical instability.    Impression: Essentially NORMAL electrodiagnostic study of the left lower limb.  There is no significant electrodiagnostic evidence of nerve entrapment, lumbosacral plexopathy or lumbar radiculopathy.    As you know, purely sensory or demyelinating radiculopathies and chemical radiculitis may not be detected with this particular electrodiagnostic study. **This electrodiagnostic study cannot rule out small fiber polyneuropathy and dysesthesias from central pain syndromes such as stroke or central pain sensitization syndromes such as fibromyalgia.  Myotomal referral pain from trigger points is also not excluded.  Lastly, the patient had very little tolerance to the 31-gauge monopolar EMG needle in the skin and into the muscle.  We were able to get good accurate EMG testing but we did not test the hamstring muscle as the patient wanted to stop at that point.  That alone would not change any of the impression of the current study.  Recommendations: 1.  Follow-up with referring physician. 2.  Continue current management of symptoms.  ___________________________ Laurence Spates FAAPMR Board Certified, American Board of Physical Medicine and Rehabilitation    Nerve Conduction Studies Anti Sensory Summary Table   Stim Site NR Peak (ms) Norm Peak (ms) P-T Amp (V) Norm P-T Amp Site1 Site2 Delta-P (ms) Dist (cm) Vel (m/s) Norm Vel (m/s)  Left Saphenous Anti Sensory (Ant Med Mall)  26.6C  14cm    3.2 <4.4 6.7 >2 14cm Ant Med Mall 3.2 0.0  >32  Left Sup Fibular Anti Sensory (Ant Lat Mall)  26.6C  14 cm    2.9 <4.4 10.9 >5.0 14 cm Ant Lat Mall 2.9 14.0 48 >32  Left Sural Anti Sensory (Lat Mall)  26.3C  Calf    3.4 <4.0 12.7 >5.0 Calf Lat Mall 3.4 14.0 41 >35   Motor Summary  Table   Stim Site NR Onset (ms) Norm Onset (ms) O-P Amp (mV) Norm O-P Amp Site1 Site2 Delta-0 (ms) Dist (cm) Vel (m/s) Norm Vel (m/s)  Left Fibular Motor (Ext Dig Brev)  27.7C  Ankle    3.8 <6.1 5.3 >2.5 B Fib Ankle 5.7 29.0 51 >38  B Fib    9.5  5.1  Poplt B Fib 2.0 10.0 50 >40  Poplt    11.5  4.9         Left Tibial Motor (Abd Hall Brev)  27C  Ankle    4.0 <6.1 5.5 >3.0 Knee Ankle 6.5 33.0 51 >35  Knee    10.5  2.0          EMG   Side Muscle Nerve Root Ins Act Fibs Psw Amp Dur Poly Recrt Int Fraser Din Comment  Left AntTibialis Dp Br Peron L4-5 Nml Nml Nml Nml Nml 0 Nml Nml   Left Fibularis Longus  Sup Br Peron L5-S1 Nml Nml Nml Nml Nml 0 Nml Nml   Left MedGastroc Tibial S1-2 Nml Nml Nml Nml Nml 0 Nml Nml   Left VastusMed Femoral L2-4 Nml Nml Nml Nml Nml 0 Nml Nml   Left BicepsFemS Sciatic L5-S1                 Pt. stopped    Nerve Conduction Studies Anti Sensory Left/Right Comparison   Stim Site L Lat (ms) R Lat (ms) L-R Lat (ms) L Amp (V) R Amp (V) L-R Amp (%)  Site1 Site2 L Vel (m/s) R Vel (m/s) L-R Vel (m/s)  Saphenous Anti Sensory (Ant Med Mall)  26.6C  14cm 3.2   6.7   14cm Ant Med Mall     Sup Fibular Anti Sensory (Ant Lat Mall)  26.6C  14 cm 2.9   10.9   14 cm Ant Lat Mall 48    Sural Anti Sensory (Lat Mall)  26.3C  Calf 3.4   12.7   Calf Lat Mall 41     Motor Left/Right Comparison   Stim Site L Lat (ms) R Lat (ms) L-R Lat (ms) L Amp (mV) R Amp (mV) L-R Amp (%) Site1 Site2 L Vel (m/s) R Vel (m/s) L-R Vel (m/s)  Fibular Motor (Ext Dig Brev)  27.7C  Ankle 3.8   5.3   B Fib Ankle 51    B Fib 9.5   5.1   Poplt B Fib 50    Poplt 11.5   4.9         Tibial Motor (Abd Hall Brev)  27C  Ankle 4.0   5.5   Knee Ankle 51    Knee 10.5   2.0            Waveforms:

## 2021-03-20 NOTE — Progress Notes (Signed)
Virginia Shaw - 55 y.o. female MRN [id number]  Date of birth: [birth year]  Office Visit Note: Visit Date: 03/16/2021 PCP: [name], MD Referred by: [name], MD  Subjective: Chief Complaint  Patient presents with  . Lower Back - Pain  . Left Leg - Pain, Weakness, Numbness  . Left Ankle - Pain, Weakness, Numbness   HPI:  Virginia Shaw is a 55 y.o. female who comes in today at the request of Dr. Basil Dess for electrodiagnostic study of the Left lower extremity.  She reports 7 out of 10 pain with dysesthesia on the left low back hip and leg in a fairly classic L5 distribution down to the ankle possibly S1.  She reports this is worse with walking and laying down.  In fact laying down for the test today because quite a bit of discomfort but she did well.  She denies any right-sided complaints.  She reports this is a sharp pain with numbness and weakness in the leg.  She reports that it does wake her up at night.  No other red flag complaints.  She has a prior lumbar laminectomy with MRI from 2020 showing some retrolisthesis of L4 on L5 with some mild foraminal narrowing and lateral recess narrowing.  She does have a prior electrodiagnostic study by Aviva Kluver, DO at [facility] in [place].  This was completed in 2019 and is reviewed below in the notes.  This did not show any evidence of radiculopathy on the left.  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   ROS Otherwise per HPI.  Assessment & Plan: Visit Diagnoses:    ICD-10-CM   1. Paresthesia of skin  R20.2 NCV with EMG (electromyography)    Plan: Impression: Essentially NORMAL electrodiagnostic study of the left lower limb.  There is no significant electrodiagnostic evidence of nerve entrapment, lumbosacral plexopathy or lumbar radiculopathy.    As you know, purely sensory or demyelinating radiculopathies and chemical radiculitis may not be detected  with this particular electrodiagnostic study. **This electrodiagnostic study cannot rule out small fiber polyneuropathy and dysesthesias from central pain syndromes such as stroke or central pain sensitization syndromes such as fibromyalgia.  Myotomal referral pain from trigger points is also not excluded.  Lastly, the patient had very little tolerance to the 31-gauge monopolar EMG needle in the skin and into the muscle.  We were able to get good accurate EMG testing but we did not test the hamstring muscle as the patient wanted to stop at that point.  That alone would not change any of the impression of the current study.  Recommendations: 1.  Follow-up with referring physician. 2.  Continue current management of symptoms.   Meds & Orders: No orders of the defined types were placed in this encounter.   Orders Placed This Encounter  Procedures  . NCV with EMG (electromyography)    Follow-up: Return for Basil Dess, MD as scheduled.   Procedures: No procedures performed  EMG & NCV Findings: All nerve conduction studies (as indicated in the following tables) were within normal limits.    All examined muscles (as indicated in the following table) showed no evidence of electrical instability.    Impression: Essentially NORMAL electrodiagnostic study of the left lower limb.  There is no significant electrodiagnostic evidence of nerve entrapment, lumbosacral plexopathy or lumbar radiculopathy.    As you know, purely sensory or demyelinating radiculopathies and chemical radiculitis may not be detected  with this particular electrodiagnostic study. **This electrodiagnostic study cannot rule out small fiber polyneuropathy and dysesthesias from central pain syndromes such as stroke or central pain sensitization syndromes such as fibromyalgia.  Myotomal referral pain from trigger points is also not excluded.  Lastly, the patient had very little tolerance to the 31-gauge monopolar EMG needle in the  skin and into the muscle.  We were able to get good accurate EMG testing but we did not test the hamstring muscle as the patient wanted to stop at that point.  That alone would not change any of the impression of the current study.  Recommendations: 1.  Follow-up with referring physician. 2.  Continue current management of symptoms.  ___________________________ Laurence Spates FAAPMR Board Certified, American Board of Physical Medicine and Rehabilitation    Nerve Conduction Studies Anti Sensory Summary Table   Stim Site NR Peak (ms) Norm Peak (ms) P-T Amp (V) Norm P-T Amp Site1 Site2 Delta-P (ms) Dist (cm) Vel (m/s) Norm Vel (m/s)  Left Saphenous Anti Sensory (Ant Med Mall)  26.6C  14cm    3.2 <4.4 6.7 >2 14cm Ant Med Mall 3.2 0.0  >32  Left Sup Fibular Anti Sensory (Ant Lat Mall)  26.6C  14 cm    2.9 <4.4 10.9 >5.0 14 cm Ant Lat Mall 2.9 14.0 48 >32  Left Sural Anti Sensory (Lat Mall)  26.3C  Calf    3.4 <4.0 12.7 >5.0 Calf Lat Mall 3.4 14.0 41 >35   Motor Summary Table   Stim Site NR Onset (ms) Norm Onset (ms) O-P Amp (mV) Norm O-P Amp Site1 Site2 Delta-0 (ms) Dist (cm) Vel (m/s) Norm Vel (m/s)  Left Fibular Motor (Ext Dig Brev)  27.7C  Ankle    3.8 <6.1 5.3 >2.5 B Fib Ankle 5.7 29.0 51 >38  B Fib    9.5  5.1  Poplt B Fib 2.0 10.0 50 >40  Poplt    11.5  4.9         Left Tibial Motor (Abd Hall Brev)  27C  Ankle    4.0 <6.1 5.5 >3.0 Knee Ankle 6.5 33.0 51 >35  Knee    10.5  2.0          EMG   Side Muscle Nerve Root Ins Act Fibs Psw Amp Dur Poly Recrt Int Fraser Din Comment  Left AntTibialis Dp Br Peron L4-5 Nml Nml Nml Nml Nml 0 Nml Nml   Left Fibularis Longus  Sup Br Peron L5-S1 Nml Nml Nml Nml Nml 0 Nml Nml   Left MedGastroc Tibial S1-2 Nml Nml Nml Nml Nml 0 Nml Nml   Left VastusMed Femoral L2-4 Nml Nml Nml Nml Nml 0 Nml Nml   Left BicepsFemS Sciatic L5-S1                 Pt. stopped    Nerve Conduction Studies Anti Sensory Left/Right Comparison   Stim Site L Lat (ms) R  Lat (ms) L-R Lat (ms) L Amp (V) R Amp (V) L-R Amp (%) Site1 Site2 L Vel (m/s) R Vel (m/s) L-R Vel (m/s)  Saphenous Anti Sensory (Ant Med Mall)  26.6C  14cm 3.2   6.7   14cm Ant Med Mall     Sup Fibular Anti Sensory (Ant Lat Mall)  26.6C  14 cm 2.9   10.9   14 cm Ant Lat Mall 48    Sural Anti Sensory (Lat Mall)  26.3C  Calf 3.4   12.7   Calf Lat Mall  41     Motor Left/Right Comparison   Stim Site L Lat (ms) R Lat (ms) L-R Lat (ms) L Amp (mV) R Amp (mV) L-R Amp (%) Site1 Site2 L Vel (m/s) R Vel (m/s) L-R Vel (m/s)  Fibular Motor (Ext Dig Brev)  27.7C  Ankle 3.8   5.3   B Fib Ankle 51    B Fib 9.5   5.1   Poplt B Fib 50    Poplt 11.5   4.9         Tibial Motor (Abd Hall Brev)  27C  Ankle 4.0   5.5   Knee Ankle 51    Knee 10.5   2.0            Waveforms:             Clinical History: Rodell Perna, MD - 09/30/2018 9:30 AM EST Garvin Fila is a 55 y.o. female who has been referred for electrodiagnostic testing. Reported symptoms have been present for many years and include: low back pain with radicular pain down the left leg as well as hand pain. She has undergone several back surgeries including a lumbar spine surgery in 2001.   Clinical Impression:  Possible causes of this condition include median mononeuropathy at the wrist (carpal tunnel syndrome), polyneuropathy and lumbar radiculopathy.  Electrodiagnostic studies were designed to test for these possibilities  Testing Results:  Nerve conduction studies were performed in both upper extremities and the left lower extremity and were unremarkable save the right transcarpal study which showed a 0.8 msec prolonged peak latency in the median nerve compared to the ulnar nerve.  EMG needle examination was performed in the left lower extremity and was unremarkable.   Ultrasound examination of both median nerves was performed and demonstrated slightly decreased mobility and decreased echogenicity in both nerves but  normal median nerve areas in the wrist, palm and forearm bilaterally.  Conclusion: This is an abnormal study. 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 No electrophysiologic evidence of left lumbar radiculopathy was found.   Clinical Note: Results were discussed with the patient at the conclusion of the study.   This is a preliminary report. Full report to follow in the form of a scanned document.  Electronically Signed By: Henrietta Dine DO Date/Time: 09/30/2018/10:53 AM     Objective:  VS:  HT:    WT:   BMI:     BP:   HR: bpm  TEMP: ( )  RESP:  Physical Exam Vitals and nursing note reviewed.  Constitutional:      General: She is not in acute distress.    Appearance: Normal appearance. She is not ill-appearing.  HENT:     Head: Normocephalic and atraumatic.     Right Ear: External ear normal.     Left Ear: External ear normal.  Eyes:     Extraocular Movements: Extraocular movements intact.  Cardiovascular:     Rate and Rhythm: Normal rate.     Pulses: Normal pulses.  Pulmonary:     Effort: Pulmonary effort is normal. No respiratory distress.  Abdominal:     General: There is no distension.     Palpations: Abdomen is soft.  Musculoskeletal:        General: Tenderness present.     Cervical back: Neck supple.     Right lower leg: No edema.     Left lower leg: No edema.  Comments: Patient ambulates without aid with a somewhat forward flexed lumbar spine.  She has a difficult time going from sit to laying down in full extension and does have concordant pain with numbness and tingling in the left leg with trying to lay flat.  She does have intact sensation to light touch in all dermatomes bilaterally.  This is all peripheral nerve distributions as well.  There are some dysesthesia noted and hyperesthesia but not really allodynia on the left.  No specific pain over the greater trochanters.  She has normal muscle  bulk and tone of the lower extremities particularly in the calves and foot intrinsic musculature.  No discoloration or swelling.  Skin:    Findings: No erythema, lesion or rash.  Neurological:     General: No focal deficit present.     Mental Status: She is alert and oriented to person, place, and time.     Sensory: No sensory deficit.     Motor: No weakness or abnormal muscle tone.     Coordination: Coordination normal.  Psychiatric:        Mood and Affect: Mood normal.        Behavior: Behavior normal.      Imaging: No results found.

## 2021-03-30 ENCOUNTER — Ambulatory Visit: Payer: Medicaid Other | Admitting: Specialist

## 2021-03-30 ENCOUNTER — Telehealth: Payer: Self-pay | Admitting: Specialist

## 2021-03-30 NOTE — Telephone Encounter (Signed)
Patient called advised she is not feeling well and can not drive to her appointment. Patient asked if Dr Louanne Skye will call her with the results of the NCS done by Dr. Ernestina Patches? The number to contact patient is [phone]

## 2021-03-31 NOTE — Telephone Encounter (Signed)
I called and advised that he doesn't call with results, that he prefers to have them in office and show images and give you the exact reason and a clear understanding of the problem.  I did put her on the cancellation list and I will call once I get a cancellation

## 2021-04-25 ENCOUNTER — Other Ambulatory Visit: Payer: Self-pay

## 2021-04-25 ENCOUNTER — Encounter: Payer: Self-pay | Admitting: Specialist

## 2021-04-25 ENCOUNTER — Ambulatory Visit: Payer: Medicaid Other | Admitting: Specialist

## 2021-04-25 VITALS — BP 104/70 | HR 76 | Ht 59.75 in | Wt 141.0 lb

## 2021-04-25 DIAGNOSIS — M5136 Other intervertebral disc degeneration, lumbar region: Secondary | ICD-10-CM

## 2021-04-25 DIAGNOSIS — R29898 Other symptoms and signs involving the musculoskeletal system: Secondary | ICD-10-CM | POA: Diagnosis not present

## 2021-04-25 DIAGNOSIS — M25559 Pain in unspecified hip: Secondary | ICD-10-CM

## 2021-04-25 DIAGNOSIS — M25552 Pain in left hip: Secondary | ICD-10-CM | POA: Diagnosis not present

## 2021-04-25 NOTE — Progress Notes (Signed)
Office Visit Note   Patient: Virginia Shaw           Date of Birth: [birth year]           MRN: [id number] Visit Date: 04/25/2021              Requested by: [name], MD [place],  [place] [address] PCP: [name], MD   Assessment & Plan: Visit Diagnoses:  1. Hip pain   2. Weakness of left leg   3. Degenerative disc disease, lumbar     Plan: Avoid frequent bending and stooping  No lifting greater than 10 lbs. May use ice or moist heat for pain. Weight loss is of benefit. Best medication for lumbar disc disease is arthritis medications but you are advised not to take these due to side effects and  Their affect on chest pain, renal disease history and bowel function.  Exercise is important to improve your indurance and does allow people to function better inspite of back pain.  Hemp CBD capsules, amazon.com 5,000-7,000 mg per bottle, 60 capsules per bottle, take one capsule twice a day. Cane in the right hand to use with left hip pain. Follow-Up Instructions: No follow-ups on file.  Tylenol ES  600 mg 3-4 tablets per day maximum.     Follow-Up Instructions: Return in about 6 months (around 10/25/2021).   Orders:  No orders of the defined types were placed in this encounter.  No orders of the defined types were placed in this encounter.     Procedures: No procedures performed   Clinical Data: No additional findings.   Subjective: Chief Complaint  Patient presents with   Left Leg - Follow-up    EMG/NCS Review    55 year old female with chronic back pain and left leg and groin pain. Had hernia surgery and is to be seen in follow up in the next week.  Not normal bowel and also has primary renal disease. She does not take NSIADs due to bowel and renal irritation and also chest spasm with  Their use. Has history of bloody sputum and SOB but has not had this fully worked up.    Review of Systems   Objective: Vital  Signs: BP 104/70 (BP Location: Left Arm, Patient Position: Sitting)   Pulse 76   Ht 4' 11.75" (1.518 m)   Wt 141 lb (64 kg)   BMI 27.77 kg/m   Physical Exam  Ortho Exam  Specialty Comments:  No specialty comments available.  Imaging: No results found.   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   Chronic abdominal pain 01/11/2014   Chronic venous insufficiency 10/30/2013   Varicose veins 10/30/2013   Chest tightness 03/25/2012   PITUITARY ADENOMA, 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   RECTAL BLEEDING 08/26/2008   UTI'S, RECURRENT 08/26/2008   Past Medical History:  Diagnosis Date   Abnormal involuntary movements(781.0)    Allergic rhinitis, cause unspecified    Anxiety state, unspecified    Cervical spondylosis without myelopathy    Chronic headaches    Disturbance of skin sensation    Fatty liver    per  patient, "not sure but does remember being told"   GERD (gastroesophageal reflux disease)    History of chicken pox    Irritable bowel syndrome    Low blood pressure    Lumbago    Myalgia and myositis, unspecified    Osteoporosis    Other and unspecified angina pectoris    Other malaise and fatigue    Peripheral vascular disease ([place])    per patient "diagnosed with it about 13-14 years ago and does not currently see someone for it, saw Dr. 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   Shortness of breath on exertion    per patient, "sometimes depends on distance"   Somatization disorder    Spasm of muscle    Systemic sclerosis ([place])    TIA  (transient ischemic attack)    Unspecified hereditary and idiopathic peripheral neuropathy     Family History  Problem Relation Age of Onset   Hypertension Father    Hyperlipidemia Father    Diabetes Father    Stroke Father    Cancer Maternal Uncle        lung   Cancer Paternal Aunt        lung   Migraines Mother    Heart attack Paternal Grandmother    Heart attack Paternal Grandfather    Hypertension Paternal Grandfather    Migraines Sister     Past Surgical History:  Procedure Laterality Date   ABDOMINAL HYSTERECTOMY  1997   arnold chiari malformation  2013   BACK SURGERY  2001, 2011,2013   Lumbar x 2   BALLOON DILATION N/A 12/22/2019   Procedure: BALLOON DILATION;  Surgeon: Wilford Corner, MD;  Location: [facility];  Service: Endoscopy;  Laterality: N/A;   barium swallow test  10/2019   BLADDER SURGERY  2011   CARDIAC CATHETERIZATION     CATARACT EXTRACTION     CERVICAL SPINE SURGERY  2013   CHOLECYSTECTOMY     COLONOSCOPY  2009   "Normal" Dr Sharlett Iles   COLONOSCOPY  2002   "Normal" Dr Freddrick March   COLONOSCOPY  ~2013   Dr Ferdinand Lango.  "Chest pain with the prep" = aborted   COLONOSCOPY WITH PROPOFOL N/A 03/29/2020   Procedure: COLONOSCOPY WITH PROPOFOL;  Surgeon: Wilford Corner, MD;  Location: [facility];  Service: Endoscopy;  Laterality: N/A;   CRANIECTOMY SUBOCCIPITAL W/ CERVICAL LAMINECTOMY / CHIARI  2012   Dr Annette Stable, Chiari decompression with suboccipital craniectomy and   CYSTOCELE REPAIR  2002   Dr Gaetano Net   ESOPHAGOGASTRODUODENOSCOPY (EGD) WITH PROPOFOL N/A 12/22/2019   Procedure: ESOPHAGOGASTRODUODENOSCOPY (EGD) WITH PROPOFOL;  Surgeon: Wilford Corner, MD;  Location: [facility];  Service: Endoscopy;  Laterality: N/A;   INSERTION OF MESH Bilateral 10/04/2020   Procedure: INSERTION OF MESH;  Surgeon: Ralene Ok, MD;  Location: [facility];  Service: General;  Laterality: Bilateral;   [place], 2000   PITUITARY EXCISION  2010   pituitary cyst  removed   RECTOCELE REPAIR  2002   Dr. Gaetano Net   RENAL BIOPSY  08/04/2019   tear duct tubes  2005   TUBAL LIGATION     XI ROBOTIC ASSISTED INGUINAL HERNIA REPAIR WITH MESH Bilateral 10/04/2020   Procedure: XI ROBOTIC ASSISTED BILATERAL INGUINAL HERNIA REPAIR WITH MESH;  Surgeon: Ralene Ok, MD;  Location: [facility];  Service: General;  Laterality: Bilateral;   Social History   Occupational History    Employer: UNEMPLOYED  Tobacco  Use   Smoking status: Never   Smokeless tobacco: Never  Vaping Use   Vaping Use: Never used  Substance and Sexual Activity   Alcohol use: No   Drug use: No   Sexual activity: Never    Birth control/protection: Abstinence

## 2021-04-25 NOTE — Patient Instructions (Addendum)
Avoid frequent bending and stooping  No lifting greater than 10 lbs. May use ice or moist heat for pain. Weight loss is of benefit. Best medication for lumbar disc disease is arthritis medications but you are advised not to take these due to side effects and  Their affect on chest pain, renal disease history and bowel function.  Exercise is important to improve your indurance and does allow people to function better inspite of back pain.  Hemp CBD capsules, amazon.com 5,000-7,000 mg per bottle, 60 capsules per bottle, take one capsule twice a day. Cane in the right hand to use with left hip pain. Follow-Up Instructions: No follow-ups on file.  Tylenol ES  600 mg 3-4 tablets per day maximum.  Vitamin B complex for B12, B6, thiamine and folic acid or folate are helpful in regeneration of nerve.

## 2021-05-03 ENCOUNTER — Ambulatory Visit: Payer: Medicaid Other | Admitting: Specialist

## 2021-06-21 IMAGING — CT CT ABD-PELV W/O CM
1 of 2 series · 14 of 32 positions shown, 19 images · non-contrast
Comparison: CT scan 08/07/2019

CLINICAL DATA: Left flank pain and gross hematuria for 1 day.
History of recent renal biopsy.

EXAM:
CT ABDOMEN AND PELVIS WITHOUT CONTRAST
TECHNIQUE: Multidetector CT imaging of the abdomen and pelvis was performed
following the standard protocol without IV contrast.

[Series 2: abd/pelvis w/(date) · axial · 0.70mm/px · z∈[-347,+33]mm · 14 of 86 slices shown, 19 images]
[im 5/86  soft-tissue]
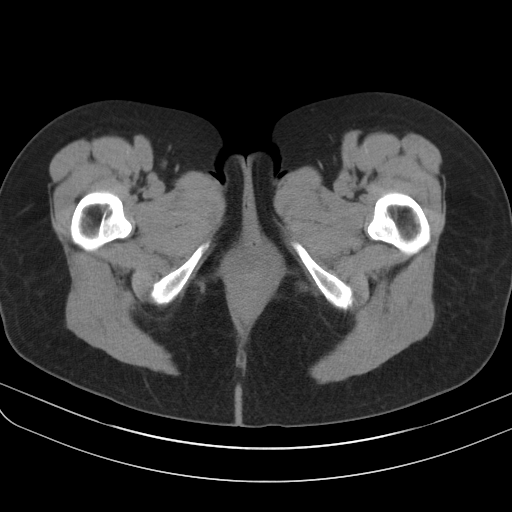
[im 5/86  bone]
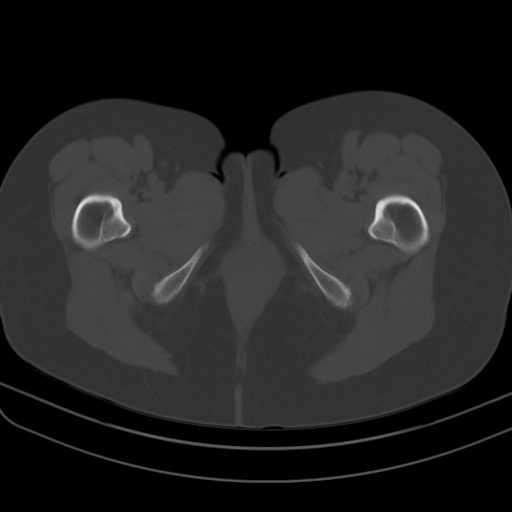
[im 13/86  soft-tissue]
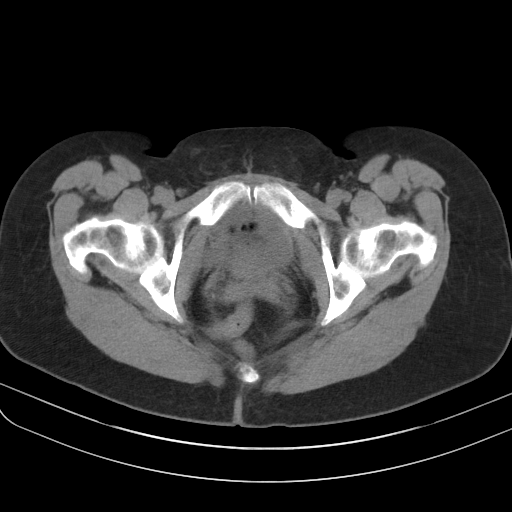
[im 18/86  soft-tissue]
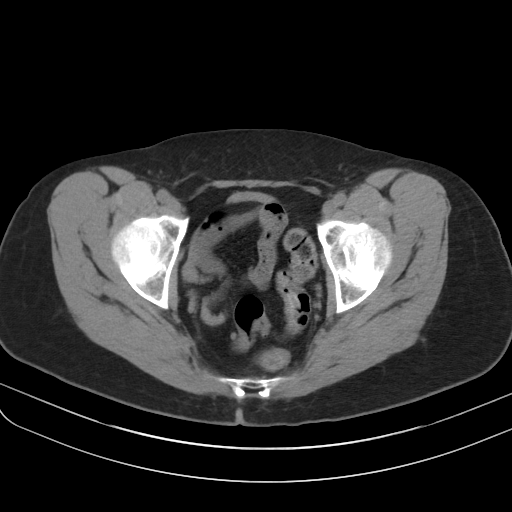
[im 26/86  soft-tissue]
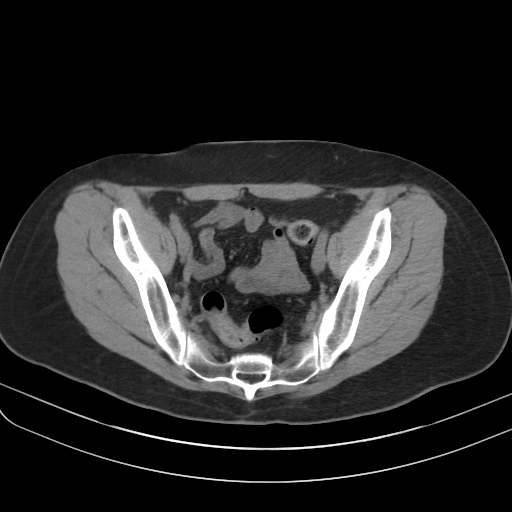
[im 30/86  soft-tissue]
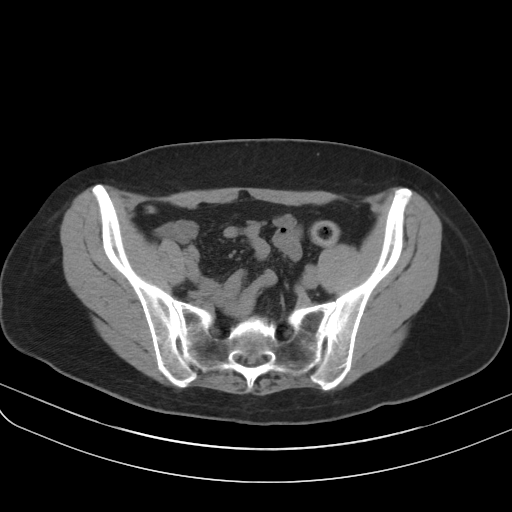
[im 39/86  soft-tissue]
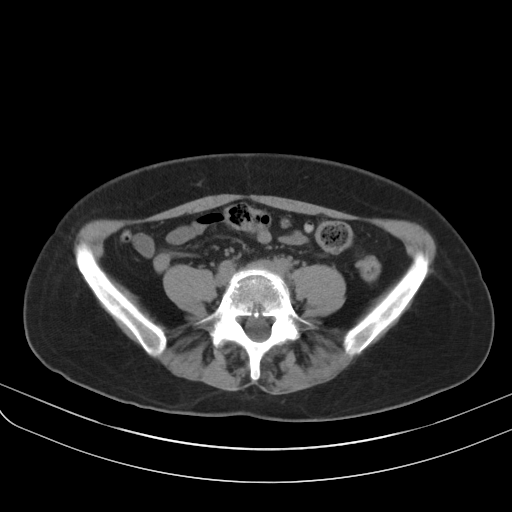
[im 43/86  soft-tissue]
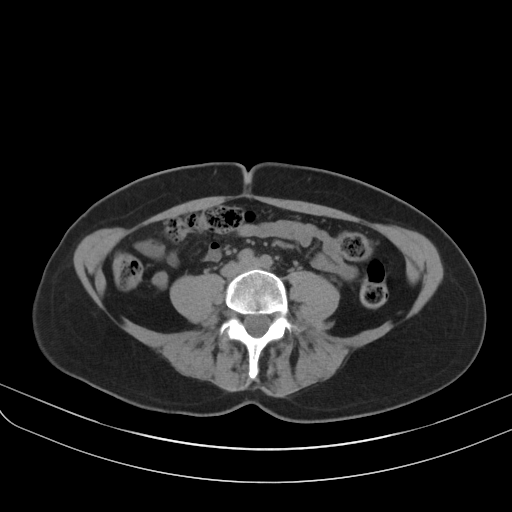
[im 47/86  soft-tissue]
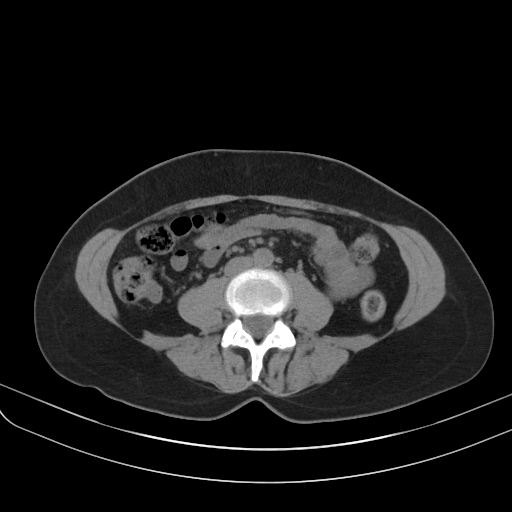
[im 56/86  soft-tissue]
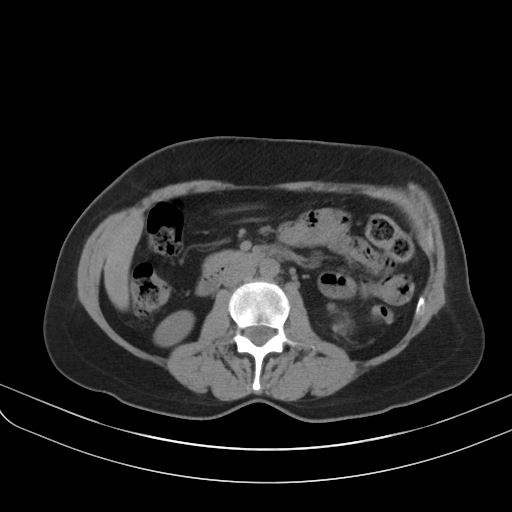
[im 56/86  bone]
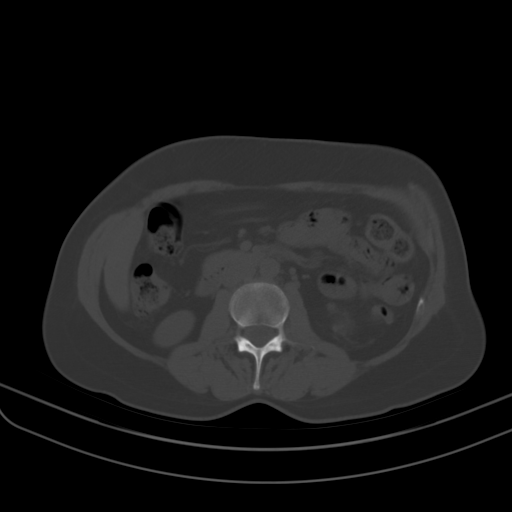
[im 60/86  soft-tissue]
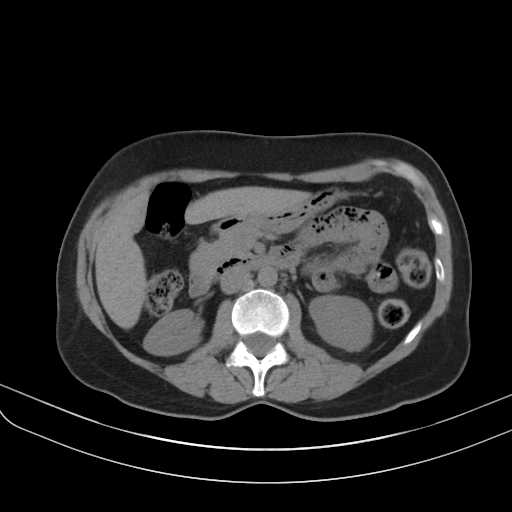
[im 69/86  soft-tissue]
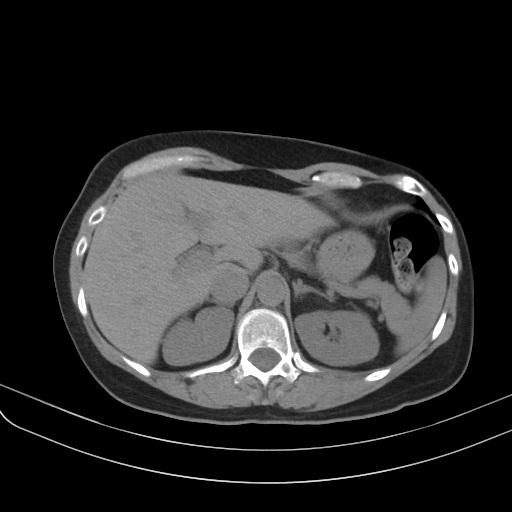
[im 69/86  lung]
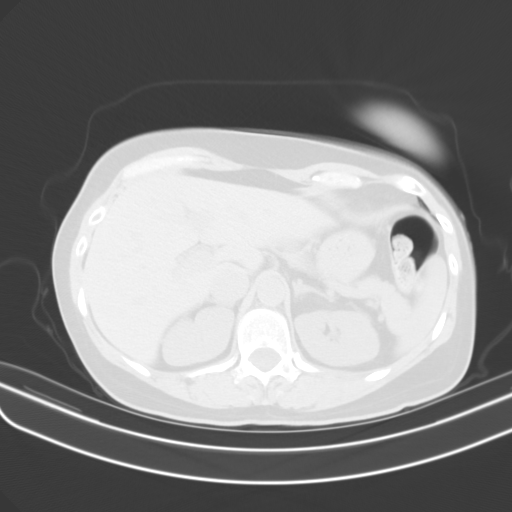
[im 73/86  soft-tissue]
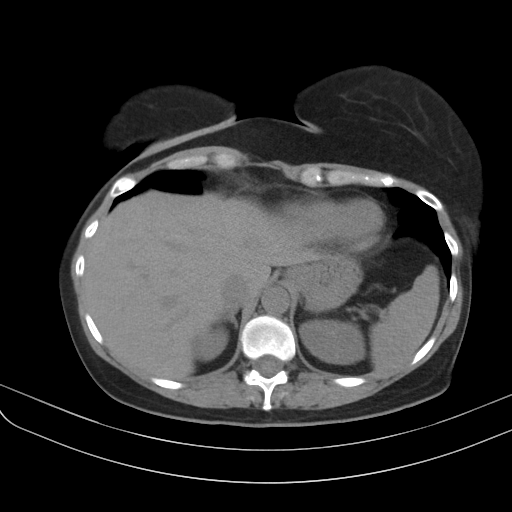
[im 73/86  lung]
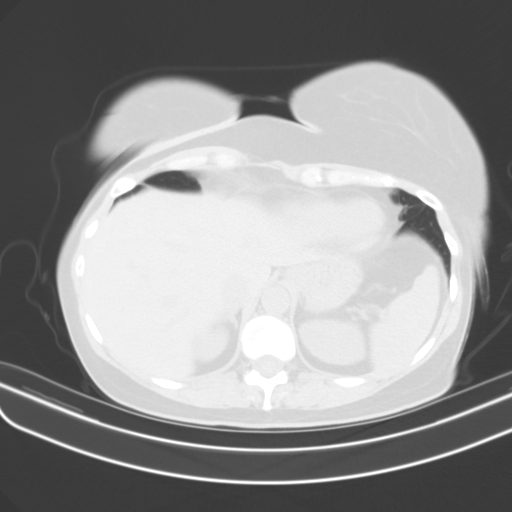
[im 77/86  lung]
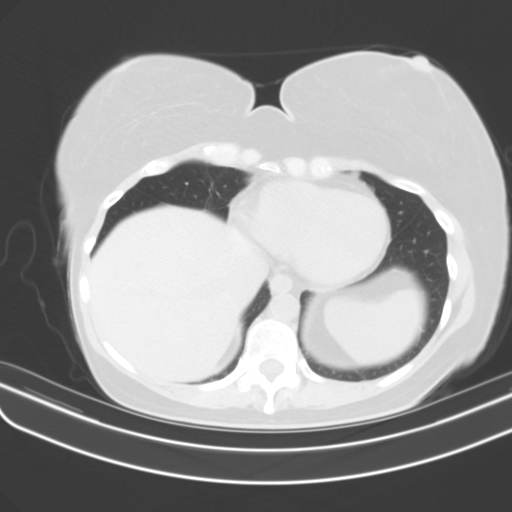
[im 81/86  soft-tissue]
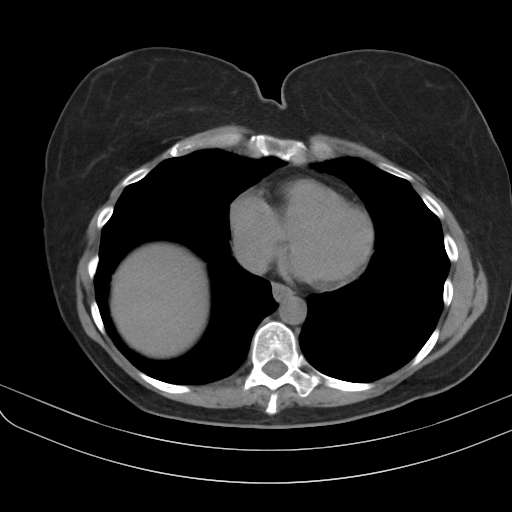
[im 81/86  lung]
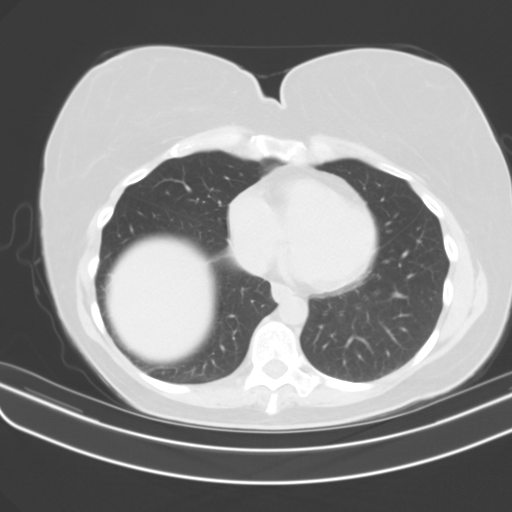

[14 of 32 positions shown; findings below may reference images not displayed]

FINDINGS: Lower chest: The lung bases are clear of acute process. No pleural
effusion or pulmonary lesions. The heart is normal in size. No
pericardial effusion. The distal esophagus and aorta are
unremarkable.

Hepatobiliary: No focal hepatic lesions or intrahepatic biliary
dilatation. The gallbladder is surgically absent. No common bile
duct dilatation.

Pancreas: No mass, inflammation or ductal dilatation.

Spleen: Normal size.  No focal lesions.

Adrenals/Urinary Tract: Adrenal glands and kidneys are unremarkable.
No renal or obstructing ureteral calculi. No hydronephrosis.
Resolving perinephric hemorrhage from recent lower pole left renal
biopsy.

The bladder is unremarkable.

Stomach/Bowel: The stomach, duodenum, small bowel and colon are
grossly normal without oral contrast. No inflammatory changes, mass
lesions or obstructive findings. The terminal ileum and appendix are
normal.

Vascular/Lymphatic: The aorta is normal in caliber. No
atheroscerlotic calcifications. No mesenteric of retroperitoneal
mass or adenopathy. Small scattered lymph nodes are noted.

Reproductive: Surgically absent.

Other: No pelvic mass or adenopathy. No free pelvic fluid
collections. No inguinal mass or adenopathy. No abdominal wall
hernia or subcutaneous lesions.

Musculoskeletal: No significant bony findings.
IMPRESSION: 1. Resolving perinephric hematoma noted on the prior recent CT scan
along the inferior aspect of the left kidney related to a prior
renal biopsy.
2. No renal, ureteral or bladder calculi or mass.
3. No acute abdominal/pelvic findings, mass lesions or adenopathy.
4. Status post cholecystectomy.  No biliary dilatation.

## 2021-07-04 ENCOUNTER — Other Ambulatory Visit: Payer: Self-pay | Admitting: Gastroenterology

## 2021-07-21 ENCOUNTER — Other Ambulatory Visit: Payer: Self-pay

## 2021-07-21 ENCOUNTER — Emergency Department (HOSPITAL_BASED_OUTPATIENT_CLINIC_OR_DEPARTMENT_OTHER): Payer: Medicaid Other

## 2021-07-21 ENCOUNTER — Encounter (HOSPITAL_BASED_OUTPATIENT_CLINIC_OR_DEPARTMENT_OTHER): Payer: Self-pay | Admitting: Emergency Medicine

## 2021-07-21 ENCOUNTER — Emergency Department (HOSPITAL_BASED_OUTPATIENT_CLINIC_OR_DEPARTMENT_OTHER)
Admission: EM | Admit: 2021-07-21 | Discharge: 2021-07-21 | Disposition: A | Discharge status: Home / Self Care | Payer: Medicaid Other | Attending: Emergency Medicine | Admitting: Emergency Medicine

## 2021-07-21 DIAGNOSIS — R519 Headache, unspecified: Secondary | ICD-10-CM | POA: Diagnosis not present

## 2021-07-21 DIAGNOSIS — R0789 Other chest pain: Secondary | ICD-10-CM | POA: Insufficient documentation

## 2021-07-21 DIAGNOSIS — R202 Paresthesia of skin: Secondary | ICD-10-CM | POA: Insufficient documentation

## 2021-07-21 DIAGNOSIS — R079 Chest pain, unspecified: Secondary | ICD-10-CM

## 2021-07-21 LAB — CBC
HCT: 41 % (ref 36.0–46.0)
Hemoglobin: 14.2 g/dL (ref 12.0–15.0)
MCH: 30.9 pg (ref 26.0–34.0)
MCHC: 34.6 g/dL (ref 30.0–36.0)
MCV: 89.3 fL (ref 80.0–100.0)
Platelets: 413 10*3/uL — ABNORMAL HIGH (ref 150–400)
RBC: 4.59 MIL/uL (ref 3.87–5.11)
RDW: 12.3 % (ref 11.5–15.5)
WBC: 8.6 10*3/uL (ref 4.0–10.5)
nRBC: 0 % (ref 0.0–0.2)

## 2021-07-21 LAB — BASIC METABOLIC PANEL
Anion gap: 11 (ref 5–15)
BUN: 17 mg/dL (ref 6–20)
CO2: 21 mmol/L — ABNORMAL LOW (ref 22–32)
Calcium: 9.3 mg/dL (ref 8.9–10.3)
Chloride: 105 mmol/L (ref 98–111)
Creatinine, Ser: 0.74 mg/dL (ref 0.44–1.00)
GFR, Estimated: >60 mL/min (ref >60)
Glucose, Bld: 96 mg/dL (ref 70–99)
Potassium: 3.9 mmol/L (ref 3.5–5.1)
Sodium: 137 mmol/L (ref 135–145)

## 2021-07-21 LAB — TROPONIN I (HIGH SENSITIVITY)
Troponin I (High Sensitivity): <2 ng/L (ref <18)
Troponin I (High Sensitivity): <2 ng/L (ref <18)

## 2021-07-21 NOTE — ED Triage Notes (Signed)
Reports intermittent epigastric pain for the last two weeks.  Reports when on the phone with the doctor around 1510 she had a tia.  Symptoms includes left sided facial numbness with sharp pain that went behind the left eye and left ear.  Reports the numbness has resolved but has a small amount of pain behind the left eye.  PCP was sending patient to pick up treatment for costochondritis and GERD.  Did not pick up the meds.

## 2021-07-21 NOTE — ED Provider Notes (Signed)
[facility] Provider Note   CSN: [id number] Arrival date & time: 07/21/21  1600     History Chief Complaint  Patient presents with   Chest Pain    Aniiya Shaw is a 55 y.o. female.   Chest Pain Associated symptoms: headache   Associated symptoms: no back pain and no shortness of breath   Patient presents with chest pain.  Anterior chest.  Similar to previous pain she has had.  Has been seen by PCP and diagnosed with costochondritis.  Had does have a previous cardiac history.  Reportedly had heart cath that showed potential spasm.  Is on Imdur.  Also with some thought of microvascular disease.  Has not had stents.  Pain is similar although has been lasting longer. Also pain in left side of head.  States she will get numbness in her face and sometimes will drop.  Comes and goes.  States she had an episode while she was on the phone with a doctor.  Patient because of the TIA.  States she has been having episodes like this at least once a month.  Reviewing records to see neurology and other providers for similar symptoms.  States she was previously diagnosed with a TIA for the same thing.     Past Medical History:  Diagnosis Date   Abnormal involuntary movements(781.0)    Allergic rhinitis, cause unspecified    Anxiety state, unspecified    Cervical spondylosis without myelopathy    Chronic headaches    Disturbance of skin sensation    Fatty liver    per patient, "not sure but does remember being told"   GERD (gastroesophageal reflux disease)    History of chicken pox    Irritable bowel syndrome    Low blood pressure    Lumbago    Myalgia and myositis, unspecified    Osteoporosis    Other and unspecified angina pectoris    Other malaise and fatigue    Peripheral vascular disease ([place])    per patient "diagnosed with it about 13-14 years ago and does not currently see someone for it, saw Dr. 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   Shortness of breath on exertion    per patient, "sometimes depends on distance"   Somatization disorder    Spasm of muscle    Systemic sclerosis ([place])    TIA (transient ischemic attack)    Unspecified hereditary and idiopathic peripheral neuropathy     Patient Active Problem List   Diagnosis Date Noted   Special screening for malignant neoplasms, colon 03/29/2020   Dysphagia 12/22/2019   DDD (degenerative disc disease), cervical 08/11/2018   DDD (degenerative disc disease), lumbar 08/11/2018   Somatic complaints, multiple 03/14/2017   Chronic pain syndrome 08/25/2014   Irritable bowel syndrome (IBS) 01/12/2014   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   Chronic abdominal pain 01/11/2014   Chronic venous insufficiency 10/30/2013   Varicose veins 10/30/2013   Chest tightness 03/25/2012   PITUITARY ADENOMA, 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   RECTAL BLEEDING 08/26/2008   UTI'S, RECURRENT 08/26/2008    Past Surgical History:  Procedure Laterality Date   ABDOMINAL HYSTERECTOMY  1997   arnold chiari malformation  2013   BACK SURGERY  2001, 2011,2013   Lumbar x 2   BALLOON DILATION N/A 12/22/2019   Procedure: BALLOON DILATION;  Surgeon: Wilford Corner, MD;  Location: [facility];  Service: Endoscopy;  Laterality: N/A;  barium swallow test  10/2019   BLADDER SURGERY  2011   CARDIAC CATHETERIZATION     CATARACT EXTRACTION     CERVICAL SPINE SURGERY  2013   CHOLECYSTECTOMY     COLONOSCOPY  2009   "Normal" Dr Sharlett Iles   COLONOSCOPY  2002   "Normal" Dr Freddrick March   COLONOSCOPY  ~2013   Dr Ferdinand Lango.  "Chest pain with the prep" = aborted   COLONOSCOPY WITH PROPOFOL N/A 03/29/2020   Procedure: COLONOSCOPY WITH PROPOFOL;  Surgeon: Wilford Corner, MD;  Location: [facility];  Service: Endoscopy;  Laterality: N/A;   CRANIECTOMY SUBOCCIPITAL W/ CERVICAL LAMINECTOMY / CHIARI  2012   Dr Annette Stable, Chiari decompression with suboccipital  craniectomy and   CYSTOCELE REPAIR  2002   Dr Gaetano Net   ESOPHAGOGASTRODUODENOSCOPY (EGD) WITH PROPOFOL N/A 12/22/2019   Procedure: ESOPHAGOGASTRODUODENOSCOPY (EGD) WITH PROPOFOL;  Surgeon: Wilford Corner, MD;  Location: [facility];  Service: Endoscopy;  Laterality: N/A;   INSERTION OF MESH Bilateral 10/04/2020   Procedure: INSERTION OF MESH;  Surgeon: Ralene Ok, MD;  Location: [facility];  Service: General;  Laterality: Bilateral;   [place], 2000   PITUITARY EXCISION  2010   pituitary cyst removed   RECTOCELE REPAIR  2002   Dr. Gaetano Net   RENAL BIOPSY  08/04/2019   tear duct tubes  2005   TUBAL LIGATION     XI ROBOTIC ASSISTED INGUINAL HERNIA REPAIR WITH MESH Bilateral 10/04/2020   Procedure: XI ROBOTIC ASSISTED BILATERAL INGUINAL HERNIA REPAIR WITH MESH;  Surgeon: Ralene Ok, MD;  Location: [place];  Service: General;  Laterality: Bilateral;     OB History   No obstetric history on file.     Family History  Problem Relation Age of Onset   Hypertension Father    Hyperlipidemia Father    Diabetes Father    Stroke Father    Cancer Maternal Uncle        lung   Cancer Paternal Aunt        lung   Migraines Mother    Heart attack Paternal Grandmother    Heart attack Paternal Grandfather    Hypertension Paternal Grandfather    Migraines Sister     Social History   Tobacco Use   Smoking status: Never   Smokeless tobacco: Never  Vaping Use   Vaping Use: Never used  Substance Use Topics   Alcohol use: No   Drug use: No    Home Medications Prior to Admission medications   Medication Sig Start Date End Date Taking? Authorizing Provider  amLODipine (NORVASC) 5 MG tablet Take 5 mg by mouth daily.     [provider]  isosorbide mononitrate (IMDUR) 30 MG 24 hr tablet Take 30 mg by mouth daily.    [provider]  metoprolol succinate (TOPROL-XL) 25 MG 24 hr tablet Take 12.5 mg by mouth daily.    [provider]  Misc Natural  Products (TART CHERRY ADVANCED) CAPS Take 1 capsule by mouth every other day.     [provider]  nitroGLYCERIN (NITROSTAT) 0.4 MG SL tablet Place 0.4 mg under the tongue every 5 (five) minutes x 3 doses as needed for chest pain.  12/24/18   [provider]  Polyethyl Glycol-Propyl Glycol (SYSTANE) 0.4-0.3 % SOLN Place 1 drop into both eyes in the morning and at bedtime.    [provider]    Allergies    Iodinated diagnostic agents, Penicillins, Prednisone, Propoxyphene, Acetaminophen, Benzonatate, Buprenorphine hcl, Citric acid,  Clindamycin, Codeine, Diazepam, Diclofenac, Esomeprazole, Morphine and related, Nsaids, Pantoprazole sodium, Ceftin [cefuroxime axetil], Cephalexin, Cortisone, Diltiazem, Doxycycline, Hydrocodone-acetaminophen, Ibuprofen, Lidocaine, Loracarbef, Macrobid [nitrofurantoin monohyd macro], Maxalt [rizatriptan], Methylprednisolone sodium succ, Naproxen, Omeprazole, Oxycodone-acetaminophen, Phenazopyridine hcl, Tomato, and Budesonide  Review of Systems   Review of Systems  Constitutional:  Negative for appetite change.  HENT:  Negative for congestion.   Respiratory:  Negative for shortness of breath.   Cardiovascular:  Positive for chest pain.  Genitourinary:  Negative for flank pain.  Musculoskeletal:  Negative for back pain.  Skin:  Negative for rash.  Neurological:  Positive for headaches.       Facial pain  Psychiatric/Behavioral:  Negative for confusion.    Physical Exam Updated Vital Signs BP 108/77   Pulse 62   Temp 98.2 F (36.8 C) (Oral)   Resp 13   Ht 4' 11.75" (1.518 m)   Wt 64.9 kg   SpO2 99%   BMI 28.16 kg/m   Physical Exam Vitals and nursing note reviewed.  Constitutional:      Appearance: She is well-developed.  HENT:     Head: Atraumatic.  Eyes:     Extraocular Movements: Extraocular movements intact.  Cardiovascular:     Rate and Rhythm: Normal rate and regular rhythm.  Pulmonary:     Breath sounds: No  wheezing, rhonchi or rales.  Chest:     Chest wall: No deformity.  Abdominal:     Tenderness: There is no abdominal tenderness.  Musculoskeletal:     Right lower leg: No edema.     Left lower leg: No edema.  Skin:    General: Skin is warm.     Capillary Refill: Capillary refill takes less than 2 seconds.  Neurological:     Mental Status: She is alert.    ED Results / Procedures / Treatments   Labs (all labs ordered are listed, but only abnormal results are displayed) Labs Reviewed  BASIC METABOLIC PANEL - Abnormal; Notable for the following components:      Result Value   CO2 21 (*)    All other components within normal limits  CBC - Abnormal; Notable for the following components:   Platelets 413 (*)    All other components within normal limits  TROPONIN I (HIGH SENSITIVITY)  TROPONIN I (HIGH SENSITIVITY)    EKG EKG Interpretation  Date/Time:  Friday July 21 2021 16:10:21 EDT Ventricular Rate:  76 PR Interval:  140 QRS Duration: 64 QT Interval:  376 QTC Calculation: 423 R Axis:   67 Text Interpretation: Normal sinus rhythm Low voltage QRS Borderline ECG No significant change since last tracing Confirmed by Davonna Belling [phone]) on 07/21/2021 5:55:33 PM  Radiology DG Chest 2 View  Result Date: 07/21/2021 CLINICAL DATA:  Chest pain. EXAM: CHEST - 2 VIEW COMPARISON:  April 21, 2020. FINDINGS: The heart size and mediastinal contours are within normal limits. Both lungs are clear. The visualized skeletal structures are unremarkable. IMPRESSION: No active cardiopulmonary disease. Electronically Signed   By: Marijo Conception M.D.   On: 07/21/2021 16:55   CT HEAD WO CONTRAST (5MM)  Result Date: 07/21/2021 CLINICAL DATA:  Headache EXAM: CT HEAD WITHOUT CONTRAST TECHNIQUE: Contiguous axial images were obtained from the base of the skull through the vertex without intravenous contrast. COMPARISON:  MRI 10/02/2016, CT brain 04/06/2013 FINDINGS: Brain: No acute territorial  infarction, hemorrhage or intracranial mass. The ventricles are nonenlarged. Vascular: No hyperdense vessels.  No unexpected calcification Skull: Status post sub occipital decompression  craniectomy. With presumed resection of posterior arch of C1. Sinuses/Orbits: No acute finding. Other: None IMPRESSION: 1. No CT evidence for acute intracranial abnormality. 2. Status post sub occipital craniectomy. Electronically Signed   By: Donavan Foil M.D.   On: 07/21/2021 19:03    Procedures Procedures   Medications Ordered in ED Medications - No data to display  ED Course  I have reviewed the triage vital signs and the nursing notes.  Pertinent labs & imaging results that were available during my care of the patient were reviewed by me and considered in my medical decision making (see chart for details).    MDM Rules/Calculators/A&P                           Patient's chest pain.  Anterior chest.  Reproducible.  Diagnosed with costochondritis by PCP.  Has previous cardiac history but EKG and troponin is negative.  Doubt cardiac ischemia.  X-ray reassuring.  Doubt pneumonia. Also left-sided facial pain.  Comes and goes.  No deficit now.  Accompanied with headache most of the time.  States sometimes facial droop.  Has been seen for this previously.  It appears she has had both potentially a migraine diagnosis and also potentially TIA diagnosis.  States she have these episodes at least once a month.  Head CT done and reassuring.  Doubt TIA.  Think it is stable for outpatient follow-up with neurology.  Doubt tumor.  Doubt ischemia.  Discharge home Final Clinical Impression(s) / ED Diagnoses Final diagnoses:  Nonspecific chest pain  Facial pain    Rx / DC Orders ED Discharge Orders     None        Davonna Belling, MD 07/21/21 1934

## 2021-07-21 NOTE — Discharge Instructions (Addendum)
Follow-up with your doctors.  You may need to follow-up with neurology again for the headache/facial pain.

## 2021-07-27 ENCOUNTER — Other Ambulatory Visit: Payer: Self-pay | Admitting: Family Medicine

## 2021-07-27 DIAGNOSIS — R0789 Other chest pain: Secondary | ICD-10-CM

## 2021-08-05 ENCOUNTER — Inpatient Hospital Stay: Admission: RE | Admit: 2021-08-05 | Payer: Medicaid Other | Source: Ambulatory Visit

## 2021-08-09 ENCOUNTER — Ambulatory Visit (HOSPITAL_COMMUNITY): Admission: RE | Admit: 2021-08-09 | Payer: Medicaid Other | Source: Home / Self Care | Admitting: Gastroenterology

## 2021-08-09 SURGERY — MANOMETRY, ESOPHAGUS

## 2021-08-25 ENCOUNTER — Other Ambulatory Visit: Payer: Self-pay | Admitting: Endocrinology

## 2021-08-25 DIAGNOSIS — D443 Neoplasm of uncertain behavior of pituitary gland: Secondary | ICD-10-CM

## 2021-09-05 ENCOUNTER — Other Ambulatory Visit: Payer: Self-pay | Admitting: Endocrinology

## 2021-09-05 DIAGNOSIS — D443 Neoplasm of uncertain behavior of pituitary gland: Secondary | ICD-10-CM

## 2021-09-17 ENCOUNTER — Ambulatory Visit
Admission: RE | Admit: 2021-09-17 | Discharge: 2021-09-17 | Disposition: A | Discharge status: Home / Self Care | Payer: Medicaid Other | Source: Ambulatory Visit | Attending: Endocrinology | Admitting: Endocrinology

## 2021-09-17 ENCOUNTER — Other Ambulatory Visit: Payer: Self-pay

## 2021-09-17 DIAGNOSIS — D443 Neoplasm of uncertain behavior of pituitary gland: Secondary | ICD-10-CM

## 2021-10-04 NOTE — Progress Notes (Deleted)
Office Visit Note  Patient: Virginia Shaw             Date of Birth: [birth year]           MRN: [id number]             PCP: [name], MD Referring: [name], MD Visit Date: 10/18/2021 Occupation: @GUAROCC @  Subjective:  No chief complaint on file.   History of Present Illness: Virginia Shaw is a 55 y.o. female ***   Activities of Daily Living:  Patient reports morning stiffness for *** {minute/hour:19697}.   Patient {ACTIONS;DENIES/REPORTS:21021675::"Denies"} nocturnal pain.  Difficulty dressing/grooming: {ACTIONS;DENIES/REPORTS:21021675::"Denies"} Difficulty climbing stairs: {ACTIONS;DENIES/REPORTS:21021675::"Denies"} Difficulty getting out of chair: {ACTIONS;DENIES/REPORTS:21021675::"Denies"} Difficulty using hands for taps, buttons, cutlery, and/or writing: {ACTIONS;DENIES/REPORTS:21021675::"Denies"}  No Rheumatology ROS completed.   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   RECTAL BLEEDING 08/26/2008   UTI'S, RECURRENT 08/26/2008    Past Medical History:  Diagnosis Date   Abnormal involuntary movements(781.0)    Allergic rhinitis, cause unspecified    Anxiety state, unspecified    Cervical spondylosis without myelopathy    Chronic headaches    Disturbance of skin sensation    Fatty liver    per patient, "not sure but does remember being told"   GERD (gastroesophageal reflux disease)     History of chicken pox    Irritable bowel syndrome    Low blood pressure    Lumbago    Myalgia and myositis, unspecified    Osteoporosis    Other and unspecified angina pectoris    Other malaise and fatigue    Peripheral vascular disease ([place])    per patient "diagnosed with it about 13-14 years ago and does not currently see someone for it, saw Dr. 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   Shortness of breath on exertion    per patient, "sometimes depends on distance"   Somatization disorder    Spasm of muscle    Systemic sclerosis ([place])    TIA (transient ischemic attack)    Unspecified hereditary and idiopathic peripheral neuropathy     Family History  Problem Relation Age of Onset   Hypertension Father    Hyperlipidemia Father    Diabetes Father    Stroke Father    Cancer Maternal Uncle        lung   Cancer Paternal Aunt        lung   Migraines Mother    Heart attack Paternal Grandmother    Heart attack Paternal Grandfather    Hypertension Paternal Grandfather    Migraines Sister    Past Surgical History:  Procedure Laterality Date   ABDOMINAL HYSTERECTOMY  1997   arnold chiari malformation  2013   BACK SURGERY  2001, 2011,2013   Lumbar x 2   BALLOON DILATION N/A 12/22/2019   Procedure: BALLOON DILATION;  Surgeon: Wilford Corner, MD;  Location: [facility];  Service: Endoscopy;  Laterality: N/A;   barium swallow test  10/2019   BLADDER SURGERY  2011   CARDIAC CATHETERIZATION     CATARACT EXTRACTION     CERVICAL SPINE SURGERY  2013   CHOLECYSTECTOMY     COLONOSCOPY  2009   "Normal" Dr Sharlett Iles   COLONOSCOPY  2002   "Normal" Dr Freddrick March   COLONOSCOPY  ~2013   Dr Ferdinand Lango.  "Chest pain with the prep" = aborted   COLONOSCOPY WITH PROPOFOL N/A 03/29/2020   Procedure: COLONOSCOPY WITH PROPOFOL;  Surgeon: Wilford Corner, MD;  Location: [facility];  Service: Endoscopy;  Laterality: N/A;    CRANIECTOMY SUBOCCIPITAL W/ CERVICAL LAMINECTOMY / CHIARI  2012   Dr Annette Stable, Chiari decompression with suboccipital craniectomy and   CYSTOCELE REPAIR  2002   Dr Gaetano Net   ESOPHAGOGASTRODUODENOSCOPY (EGD) WITH PROPOFOL N/A 12/22/2019   Procedure: ESOPHAGOGASTRODUODENOSCOPY (EGD) WITH PROPOFOL;  Surgeon: Wilford Corner, MD;  [facility];  Service: Endoscopy;  Laterality: N/A;   INSERTION OF MESH Bilateral 10/04/2020   Procedure: INSERTION OF MESH;  Surgeon: Ralene Ok, MD;  Location: [facility];  Service: General;  Laterality: Bilateral;   [place], 2000   PITUITARY EXCISION  2010   pituitary cyst removed   RECTOCELE REPAIR  2002   Dr. Gaetano Net   RENAL BIOPSY  08/04/2019   tear duct tubes  2005   TUBAL LIGATION     XI ROBOTIC ASSISTED INGUINAL HERNIA REPAIR WITH MESH Bilateral 10/04/2020   Procedure: XI ROBOTIC ASSISTED BILATERAL INGUINAL HERNIA REPAIR WITH MESH;  Surgeon: Ralene Ok, MD;  Location: [facility];  Service: General;  Laterality: Bilateral;   Social History   Social History Narrative   Lives at home with children. ( 2 daughters)   Regular exercise-yes   Caffeine Use-no   GED   Not Working outside home    There is no immunization history on file for this patient.   Objective: Vital Signs: There were no vitals taken for this visit.   Physical Exam   Musculoskeletal Exam: ***  CDAI Exam: CDAI Score: -- Patient Global: --; Provider Global: -- Swollen: --; Tender: -- Joint Exam 10/18/2021   No joint exam has been documented for this visit   There is currently no information documented on the homunculus. Go to the Rheumatology activity and complete the homunculus joint exam.  Investigation: No additional findings.  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 COMPARISON:  MRI October 02, 2016. FINDINGS: Brain: No acute infarction, hemorrhage, hydrocephalus, extra-axial collection or intraparenchymal brain mass lesion. Prior suboccipital craniectomy with widely patent foramen magnum, similar. High-resolution imaging was performed through the sella/pituitary without contrast. Similar morphology/size of the pituitary status post transsphenoidal pituitary surgery. Similar size/appearance of an approximately 3 x 7 mm cystic area anteriorly, which is better characterized on this study due to high-resolution imaging (for example series 12, image 9). Normal optic chiasm. Unremarkable infundibulum, which is midline. Normal posterior T1 hyperintense pituitary bright spot. Evaluation of pituitary enhancement is precluded given the absence of contrast. Vascular: Major arterial flow voids are maintained at the skull base. Skull and upper cervical spine: Normal marrow signal. Incompletely imaged cervical ACDF. Sinuses/Orbits: Chronic retention cyst in the inferior maxillary sinuses bilaterally. Mild ethmoid air cell mucosal thickening. Unremarkable orbits. Other: No mastoid effusions.  IMPRESSION: 1. No substantial change in 7 mm cystic area in the anterior pituitary, probably postsurgical change given prior transsphenoidal surgery. Similar size/morphology of the pituitary on this noncontrast study. 2. Prior suboccipital craniectomy with widely patent foramen magnum, similar. 3. No evidence of acute abnormality. Electronically Signed   By: Margaretha Sheffield M.D.   On: 09/17/2021 11:54    Recent Labs: Lab Results  Component Value Date   WBC 8.6 07/21/2021   HGB 14.2 07/21/2021   PLT 413 (H) 07/21/2021   NA 137 07/21/2021   K 3.9 07/21/2021   CL 105 07/21/2021   CO2 21 (L) 07/21/2021   GLUCOSE 96 07/21/2021   BUN 17 07/21/2021   CREATININE 0.74 07/21/2021   BILITOT 0.6 09/27/2020    ALKPHOS 64 09/27/2020   AST 24 09/27/2020   ALT 22 09/27/2020   PROT 7.0 09/27/2020   ALBUMIN 4.0 09/27/2020   CALCIUM 9.3 07/21/2021   GFRAA >60 04/21/2020    Speciality Comments: No specialty comments available.  Procedures:  No procedures performed Allergies: Iodinated diagnostic agents, Penicillins, Prednisone, Propoxyphene, Acetaminophen, Benzonatate, Buprenorphine hcl, Citric acid, Clindamycin, Codeine, Diazepam, Diclofenac, Esomeprazole, Morphine and related, Nsaids, Pantoprazole sodium, Ceftin [cefuroxime axetil], Cephalexin, Cortisone, Diltiazem, Doxycycline, Hydrocodone-acetaminophen, Ibuprofen, Lidocaine, Loracarbef, Macrobid [nitrofurantoin monohyd macro], Maxalt [rizatriptan], Methylprednisolone sodium succ, Naproxen, Omeprazole, Oxycodone-acetaminophen, Phenazopyridine hcl, Tomato, and Budesonide   Assessment / Plan:     Visit Diagnoses: No diagnosis found.  Orders: No orders of the defined types were placed in this encounter.  No orders of the defined types were placed in this encounter.   Face-to-face time spent with patient was *** minutes. Greater than 50% of time was spent in counseling and coordination of care.  Follow-Up Instructions: No follow-ups on file.   Earnestine Mealing, CMA  Note - This record has been created using Editor, commissioning.  Chart creation errors have been sought, but may not always  have been located. Such creation errors do not reflect on  the standard of medical care.

## 2021-10-12 NOTE — Progress Notes (Signed)
Office Visit Note  Patient: Virginia Shaw             Date of Birth: Apr 04, 1966           MRN: [id number]             PCP: [name], MD Referring: [name], MD Visit Date: 10/13/2021 Occupation: @GUAROCC @  Subjective:  Chronic joint pain   History of Present Illness: Virginia Shaw is a 55 y.o. female with history of chronic inflammatory arthritis, OA, and myofascial pain.  She continues to have chronic pain in both hands.  She has intermittent inflammation in both hands.  She has ongoing pain in the left ankle joint.   She has been alternating heat and ice for pain relief. She has ongoing pain due to IC.  She avoids the use of NSAIDs and Tylenol.  She continues to have a history of chronic neck and lower back pain which was previously followed by Dr. Louanne Skye.  She presents today to further discuss starting on Plaquenil since her symptoms have progressively been worsening since her last office visit in December 2021.  Activities of Daily Living:  Patient reports morning stiffness for all day. Patient Reports nocturnal pain.  Difficulty dressing/grooming: Denies Difficulty climbing stairs: Denies Difficulty getting out of chair: Reports Difficulty using hands for taps, buttons, cutlery, and/or writing: Reports  Review of Systems  Constitutional:  Negative for fatigue.  HENT:  Positive for nose dryness. Negative for mouth sores and mouth dryness.   Eyes:  Positive for dryness. Negative for pain, itching and visual disturbance.  Respiratory:  Negative for cough, hemoptysis and difficulty breathing.   Cardiovascular:  Positive for palpitations. Negative for hypertension and swelling in legs/feet.  Gastrointestinal:  Positive for blood in stool. Negative for constipation and diarrhea.  Endocrine: Positive for increased urination.  Genitourinary:  Positive for difficulty urinating. Negative for painful urination.  Musculoskeletal:  Positive for joint pain, joint  pain, joint swelling, myalgias, morning stiffness, muscle tenderness and myalgias. Negative for muscle weakness.  Skin:  Positive for rash. Negative for color change, pallor, hair loss, nodules/bumps, skin tightness, ulcers and sensitivity to sunlight.  Allergic/Immunologic: Negative for susceptible to infections.  Neurological:  Positive for headaches. Negative for dizziness and memory loss.  Hematological:  Negative for swollen glands.  Psychiatric/Behavioral:  Negative for depressed mood, confusion and sleep disturbance. The patient is not nervous/anxious.    PMFS History:  Patient Active Problem List   Diagnosis Date Noted  . Special screening for malignant neoplasms, colon 03/29/2020  . Dysphagia 12/22/2019  . DDD (degenerative disc disease), cervical 08/11/2018  . DDD (degenerative disc disease), lumbar 08/11/2018  . Somatic complaints, multiple 03/14/2017  . Chronic pain syndrome 08/25/2014  . Irritable bowel syndrome (IBS) 01/12/2014  . 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 Chronic abdominal pain 01/11/2014  . Chronic venous insufficiency 10/30/2013  . Varicose veins 10/30/2013  . Chest tightness 03/25/2012  . 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 RECTAL BLEEDING 08/26/2008  . UTI'S, RECURRENT 08/26/2008    Past Medical History:  Diagnosis Date  . Abnormal involuntary movements(781.0)   . Allergic rhinitis, cause unspecified   . Anxiety state, unspecified   . Cervical spondylosis without myelopathy   . Chronic headaches   . Disturbance of skin sensation   . Fatty liver    per patient, "not sure but does remember being told"  . GERD (gastroesophageal reflux disease)   . History of chicken pox   . Irritable  bowel syndrome   . Low blood pressure   . Lumbago   . Myalgia and myositis, unspecified   . Osteoporosis   . Other and unspecified angina pectoris   . Other malaise and fatigue   . Peripheral vascular disease ([place])    per patient "diagnosed with it about  13-14 years ago and does not currently see someone for it, saw Dr. Vista Lawman in the past"  . PONV (postoperative nausea and vomiting)   . 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 Shortness of breath on exertion    per patient, "sometimes depends on distance"  . Somatization disorder   . Spasm of muscle   . Systemic sclerosis ([place])   . TIA (transient ischemic attack)   . Unspecified hereditary and idiopathic peripheral neuropathy     Family History  Problem Relation Age of Onset  . Hypertension Father   . Hyperlipidemia Father   . Diabetes Father   . Stroke Father   . Cancer Maternal Uncle        lung  . Cancer Paternal Aunt        lung  . Migraines Mother   . Heart attack Paternal Grandmother   . Heart attack Paternal Grandfather   . Hypertension Paternal Grandfather   . Migraines Sister    Past Surgical History:  Procedure Laterality Date  . ABDOMINAL HYSTERECTOMY  1997  . arnold chiari malformation  2013  . BACK SURGERY  2001, 2011,2013   Lumbar x 2  . BALLOON DILATION N/A 12/22/2019   Procedure: BALLOON DILATION;  Surgeon: Wilford Corner, MD;  Location: [facility];  Service: Endoscopy;  Laterality: N/A;  . barium swallow test  10/2019  . BLADDER SURGERY  2011  . CARDIAC CATHETERIZATION    . CARPAL TUNNEL RELEASE Left   . CATARACT EXTRACTION    . CERVICAL SPINE SURGERY  2013  . CHOLECYSTECTOMY    . COLONOSCOPY  2009   "Normal" Dr Sharlett Iles  . COLONOSCOPY  2002   "Normal" Dr Freddrick March  . COLONOSCOPY  ~2013   Dr Ferdinand Lango.  "Chest pain with the prep" = aborted  . COLONOSCOPY WITH PROPOFOL N/A 03/29/2020   Procedure: COLONOSCOPY WITH PROPOFOL;  Surgeon: Wilford Corner, MD;  Location: [facility];  Service: Endoscopy;  Laterality: N/A;  . CRANIECTOMY SUBOCCIPITAL W/ CERVICAL LAMINECTOMY / CHIARI  2012   Dr Annette Stable, Chiari decompression with suboccipital craniectomy and  . CYSTOCELE REPAIR  2002   Dr Gaetano Net  . ESOPHAGOGASTRODUODENOSCOPY (EGD) WITH PROPOFOL N/A  12/22/2019   Procedure: ESOPHAGOGASTRODUODENOSCOPY (EGD) WITH PROPOFOL;  Surgeon: Wilford Corner, MD;  Location: [facility];  Service: Endoscopy;  Laterality: N/A;  . INSERTION OF MESH Bilateral 10/04/2020   Procedure: INSERTION OF MESH;  Surgeon: Ralene Ok, MD;  Location: [facility];  Service: General;  Laterality: Bilateral;  . OOPHORECTOMY  1999, 2000  . PITUITARY EXCISION  2010   pituitary cyst removed  . RECTOCELE REPAIR  2002   Dr. Gaetano Net  . RENAL BIOPSY  08/04/2019  . tear duct tubes  2005  . TUBAL LIGATION    . XI ROBOTIC ASSISTED INGUINAL HERNIA REPAIR WITH MESH Bilateral 10/04/2020   Procedure: XI ROBOTIC ASSISTED BILATERAL INGUINAL HERNIA REPAIR WITH MESH;  Surgeon: Ralene Ok, MD;  Location: [place] [facility];  Service: General;  Laterality: Bilateral;   Social History   Social History Narrative   Lives at home with children. ( 2 daughters)   Regular exercise-yes   Caffeine Use-no   GED   Not Working  outside home    There is no immunization history on file for this patient.   Objective: Vital Signs: BP 108/74 (BP Location: Left Arm, Patient Position: Sitting, Cuff Size: Normal)   Pulse 79   Ht 4' 11.75" (1.518 m)   Wt 142 lb 3.2 oz (64.5 kg)   BMI 28.00 kg/m    Physical Exam Vitals and nursing note reviewed.  Constitutional:      Appearance: She is well-developed.  HENT:     Head: Normocephalic and atraumatic.  Eyes:     Conjunctiva/sclera: Conjunctivae normal.  Pulmonary:     Effort: Pulmonary effort is normal.  Abdominal:     Palpations: Abdomen is soft.  Musculoskeletal:     Cervical back: Normal range of motion.  Skin:    General: Skin is warm and dry.     Capillary Refill: Capillary refill takes less than 2 seconds.  Neurological:     Mental Status: She is alert and oriented to person, place, and time.  Psychiatric:        Behavior: Behavior normal.     Musculoskeletal Exam: Generalized hyperalgesia.  C-spine is limited range of motion  without rotation.  Painful range of motion of the lumbar spine.  Shoulder joints have limited abduction to about 120 degrees with discomfort.  Elbow joints have good range of motion with some tenderness in the left elbow.  Tenderness over both wrist joints.  Tenderness of all MCP joints and mild tenderness in the PIP joints.  PIP and DIP thickening consistent with osteoarthritis of both hands noted.  Hip joints have limited and painful range of motion bilaterally.  Knee joints have good range of motion with no warmth or effusion.  Ankle joints have good range of motion with some tenderness in the left ankle.  Tenderness over the left fifth MTP joint.  CDAI Exam: CDAI Score: 14.2  Patient Global: 7 mm; Provider Global: 5 mm Swollen: 0 ; Tender: 15  Joint Exam 10/13/2021      Right  Left  Glenohumeral   Tender   Tender  Elbow      Tender  MCP 1      Tender  MCP 2   Tender   Tender  MCP 3   Tender   Tender  MCP 4   Tender     MCP 5   Tender     IP      Tender  PIP 2      Tender  PIP 3      Tender  Ankle      Tender  MTP 5      Tender     Investigation: No additional findings.  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 COMPARISON:  MRI October 02, 2016. FINDINGS: Brain: No acute infarction, hemorrhage, hydrocephalus, extra-axial collection or intraparenchymal brain mass lesion. Prior suboccipital craniectomy with widely patent foramen magnum, similar. High-resolution imaging was performed through the sella/pituitary without contrast. Similar morphology/size of the pituitary status post transsphenoidal pituitary surgery. Similar size/appearance of an approximately 3 x 7 mm cystic area anteriorly, which is better characterized  on this study due to high-resolution imaging (for example series 12, image 9). Normal optic chiasm. Unremarkable infundibulum, which is midline. Normal posterior T1 hyperintense pituitary bright spot. Evaluation of pituitary  enhancement is precluded given the absence of contrast. Vascular: Major arterial flow voids are maintained at the skull base. Skull and upper cervical spine: Normal marrow signal. Incompletely imaged cervical ACDF. Sinuses/Orbits: Chronic retention cyst in the inferior maxillary sinuses bilaterally. Mild ethmoid air cell mucosal thickening. Unremarkable orbits. Other: No mastoid effusions. IMPRESSION: 1. No substantial change in 7 mm cystic area in the anterior pituitary, probably postsurgical change given prior transsphenoidal surgery. Similar size/morphology of the pituitary on this noncontrast study. 2. Prior suboccipital craniectomy with widely patent foramen magnum, similar. 3. No evidence of acute abnormality. Electronically Signed   By: Margaretha Sheffield M.D.   On: 09/17/2021 11:54    Recent Labs: Lab Results  Component Value Date   WBC 8.6 07/21/2021   HGB 14.2 07/21/2021   PLT 413 (H) 07/21/2021   NA 137 07/21/2021   K 3.9 07/21/2021   CL 105 07/21/2021   CO2 21 (L) 07/21/2021   GLUCOSE 96 07/21/2021   BUN 17 07/21/2021   CREATININE 0.74 07/21/2021   BILITOT 0.6 09/27/2020   ALKPHOS 64 09/27/2020   AST 24 09/27/2020   ALT 22 09/27/2020   PROT 7.0 09/27/2020   ALBUMIN 4.0 09/27/2020   CALCIUM 9.3 07/21/2021   GFRAA >60 04/21/2020    Speciality Comments: No specialty comments available.  Procedures:  No procedures performed Allergies: Iodinated diagnostic agents, Penicillins, Prednisone, Propoxyphene, Acetaminophen, Benzonatate, Buprenorphine hcl, Citric acid, Clindamycin, Codeine, Diazepam, Diclofenac, Esomeprazole, Morphine and related, Nsaids, Pantoprazole sodium, Ceftin [cefuroxime axetil], Cephalexin, Cortisone, Diltiazem, Doxycycline,  Hydrocodone-acetaminophen, Ibuprofen, Lidocaine, Loracarbef, Macrobid [nitrofurantoin monohyd macro], Maxalt [rizatriptan], Methylprednisolone sodium succ, Naproxen, Omeprazole, Oxycodone-acetaminophen, Phenazopyridine hcl, Tomato, and Budesonide   Assessment / Plan:     Visit Diagnoses: Chronic inflammatory arthritis - RF negative, anti-CCP negative, 14 3 3  eta positive, positive mild synovitis on ultrasound examination.  Her bilateral median nerves are within normal limits: She presents today with increased pain involving multiple joints especially both hands, both wrist joints, and the left ankle joint.  No obvious synovitis was noted on examination today. She has noticed intermittent inflammation in her hands and left ankle intermittently.  At her last office visit on 10/19/2020 Dr. Estanislado Pandy discussed proceeding with a trial of Plaquenil but she declined at that time.  Due to the increase in her symptoms she requested to further discuss Plaquenil in detail today.  Indications, contraindications, potential side effects were discussed today in detail.  All questions were addressed.  After reviewing the potential side effects she declined starting on Plaquenil at this time.  She asked about alternative medications but I discussed that other DMARDs and Biologics will be more immunosuppressive and have other potential side effects.  She would like to further discuss Plaquenil with her ophthalmologist prior to initiating therapy.  Consent was not obtained today.  She will need to return to the office to sign the consent form if she changes her mind about starting Plaquenil.  She will follow-up as needed.  Patient was counseled on the purpose, proper use, and adverse effects of hydroxychloroquine including nausea/diarrhea, skin rash, headaches, and sun sensitivity.  Advised patient to wear sunscreen once starting hydroxychloroquine to reduce risk of rash associated with sun sensitivity.  Discussed importance of  annual eye exams while on hydroxychloroquine to monitor to ocular toxicity and discussed importance of frequent laboratory monitoring.  Provided patient with eye exam form for baseline ophthalmologic exam.  Reviewed risk for QTC prolongation when used in combination with other QTc prolonging agents (including but not  limited to antiarrhythmics, macrolide antibiotics, flouroquinolones, tricyclic antidepressants, citalopram, specific antipsychotics, ondansetron, migraine triptans, and methadone). Provided patient with educational materials on hydroxychloroquine and answered all questions.  Patient consented to hydroxychloroquine.   High risk medication use: Discussed a trial of Plaquenil in detail today.  All questions were addressed.  She is apprehensive about potential side effects and declined therapy at this time.  She would like to further discuss with her ophthalmologist and will notify us if she changes her mind.  Consent was obtained today. Discussed that the other DMARDs and Biologics have more potential side effects as well as will suppress her immune system.  She does not want to initiate therapy at this time.  Primary osteoarthritis of both hands: She has PIP and DIP thickening consistent with osteoarthritis of both hands.  Complete fist formation bilaterally.  Discussed the importance of joint protection and muscle strengthening.  Myofascial pain: She has generalized hyperalgesia and positive tender points on examination today.  She would benefit from regular exercise and good sleep hygiene.  Bilateral carpal tunnel syndrome - Nerve conduction study performed at [facility]  She was evaluated by Dr. Erlinda Hong on 05/26/20.  She had surgical release on the right side with no improvement in her symptoms.  DDD (degenerative disc disease), cervical: She has limited range of motion with lateral rotation.  She continues to have persistent neck pain and stiffness.  DDD (degenerative disc disease), lumbar: Chronic  pain.  Followed by Dr. Louanne Skye.  Radiculopathy, lumbar region: She is not experiencing any symptoms of radiculopathy currently.  Chronic pain syndrome: She continues to have chronic pain on a daily basis.  She has generalized arthralgias and myalgias at this time.  She cannot tolerate taking Tylenol or NSAIDs due to history of IC.  Other medical conditions are listed as follows:  Chronic venous insufficiency  Pituitary adenoma ([place])  History of varicose veins  History of gastroesophageal reflux (GERD)  History of ovarian cyst  Coronary artery spasm ([facility])  History of IBS  Orders: No orders of the defined types were placed in this encounter.  No orders of the defined types were placed in this encounter.    Follow-Up Instructions: Return in about 1 year (around 10/13/2022), or if symptoms worsen or fail to improve, for Chronic inflammatory arthritis, OA, myofascial pain.   Ofilia Neas, PA-C  Note - This record has been created using Dragon software.  Chart creation errors have been sought, but may not always  have been located. Such creation errors do not reflect on  the standard of medical care.

## 2021-10-13 ENCOUNTER — Other Ambulatory Visit: Payer: Self-pay

## 2021-10-13 ENCOUNTER — Encounter: Payer: Self-pay | Admitting: Physician Assistant

## 2021-10-13 ENCOUNTER — Ambulatory Visit: Payer: Medicaid Other | Admitting: Physician Assistant

## 2021-10-13 VITALS — BP 108/74 | HR 79 | Ht 59.75 in | Wt 142.2 lb

## 2021-10-13 DIAGNOSIS — M1990 Unspecified osteoarthritis, unspecified site: Secondary | ICD-10-CM

## 2021-10-13 DIAGNOSIS — M19041 Primary osteoarthritis, right hand: Secondary | ICD-10-CM

## 2021-10-13 DIAGNOSIS — Z8742 Personal history of other diseases of the female genital tract: Secondary | ICD-10-CM

## 2021-10-13 DIAGNOSIS — M19042 Primary osteoarthritis, left hand: Secondary | ICD-10-CM

## 2021-10-13 DIAGNOSIS — Z8679 Personal history of other diseases of the circulatory system: Secondary | ICD-10-CM

## 2021-10-13 DIAGNOSIS — M5136 Other intervertebral disc degeneration, lumbar region: Secondary | ICD-10-CM

## 2021-10-13 DIAGNOSIS — Z79899 Other long term (current) drug therapy: Secondary | ICD-10-CM

## 2021-10-13 DIAGNOSIS — M7918 Myalgia, other site: Secondary | ICD-10-CM

## 2021-10-13 DIAGNOSIS — M5030 Other cervical disc degeneration, unspecified cervical region: Secondary | ICD-10-CM

## 2021-10-13 DIAGNOSIS — Z8719 Personal history of other diseases of the digestive system: Secondary | ICD-10-CM

## 2021-10-13 DIAGNOSIS — M1380 Other specified arthritis, unspecified site: Secondary | ICD-10-CM

## 2021-10-13 DIAGNOSIS — G894 Chronic pain syndrome: Secondary | ICD-10-CM

## 2021-10-13 DIAGNOSIS — M51369 Other intervertebral disc degeneration, lumbar region without mention of lumbar back pain or lower extremity pain: Secondary | ICD-10-CM

## 2021-10-13 DIAGNOSIS — G5603 Carpal tunnel syndrome, bilateral upper limbs: Secondary | ICD-10-CM | POA: Diagnosis not present

## 2021-10-13 DIAGNOSIS — M5416 Radiculopathy, lumbar region: Secondary | ICD-10-CM

## 2021-10-13 DIAGNOSIS — I872 Venous insufficiency (chronic) (peripheral): Secondary | ICD-10-CM

## 2021-10-13 DIAGNOSIS — D352 Benign neoplasm of pituitary gland: Secondary | ICD-10-CM

## 2021-10-13 DIAGNOSIS — I201 Angina pectoris with documented spasm: Secondary | ICD-10-CM

## 2021-10-13 NOTE — Patient Instructions (Signed)
Standing Labs We placed an order today for your standing lab work.   Please have your standing labs drawn in 1 month, 3 months, and every 5 months   If possible, please have your labs drawn 2 weeks prior to your appointment so that the provider can discuss your results at your appointment.  Please note that you may see your imaging and lab results in [facility] before we have reviewed them. We may be awaiting multiple results to interpret others before contacting you. Please allow our office up to 72 hours to thoroughly review all of the results before contacting the office for clarification of your results.  We have open lab daily: Monday through Thursday from 1:30-4:30 PM and Friday from 1:30-4:00 PM at the office of Dr. Bo Merino, [facility].   Please be advised, all patients with office appointments requiring lab work will take precedent over walk-in lab work.  If possible, please come for your lab work on Monday and Friday afternoons, as you may experience shorter wait times. The office is located at [address] [place], [place], [place] [address] No appointment is necessary.   Labs are drawn by [facility]. Please bring your co-pay at the time of your lab draw.  You may receive a bill from [facility] for your lab work.  If you wish to have your labs drawn at another location, please call the office 24 hours in advance to send orders.  If you have any questions regarding directions or hours of operation,  please call [phone].   As a reminder, please drink plenty of water prior to coming for your lab work. Thanks!   Hydroxychloroquine Tablets What is this medication? HYDROXYCHLOROQUINE (hye drox ee KLOR oh kwin) treats autoimmune conditions, such as rheumatoid arthritis and lupus. It works by slowing down an overactive immune system. It may also be used to prevent and treat malaria. It works by killing the parasite that causes malaria. It belongs to a group of  medications called DMARDs. This medicine may be used for other purposes; ask your health care provider or pharmacist if you have questions. COMMON BRAND NAME(S): Plaquenil, Quineprox What should I tell my care team before I take this medication? They need to know if you have any of these conditions: Diabetes Eye disease, vision problems G6PD deficiency Heart disease History of irregular heartbeat If you often drink alcohol Kidney disease Liver disease Porphyria Psoriasis An unusual or allergic reaction to chloroquine, hydroxychloroquine, other medications, foods, dyes, or preservatives Pregnant or trying to get pregnant Breast-feeding How should I use this medication? Take this medication by mouth with a glass of water. Take it as directed on the prescription label. Do not cut, crush or chew this medication. Swallow the tablets whole. Take it with food. Do not take it more than directed. Take all of this medication unless your care team tells you to stop it early. Keep taking it even if you think you are better. Take products with antacids in them at a different time of day than this medication. Take this medication 4 hours before or 4 hours after antacids. Talk to your care team if you have questions. Talk to your care team about the use of this medication in children. While this medication may be prescribed for selected conditions, precautions do apply. Overdosage: If you think you have taken too much of this medicine contact a poison control center or emergency room at once. NOTE: This medicine is only for you. Do not share  this medicine with others. What if I miss a dose? If you miss a dose, take it as soon as you can. If it is almost time for your next dose, take only that dose. Do not take double or extra doses. What may interact with this medication? Do not take this medication with any of the following: Cisapride Dronedarone Pimozide Thioridazine This medication may also  interact with the following: Ampicillin Antacids Cimetidine Cyclosporine Digoxin Kaolin Medications for diabetes, like insulin, glipizide, glyburide Medications for seizures like carbamazepine, phenobarbital, phenytoin Mefloquine Methotrexate Other medications that prolong the QT interval (cause an abnormal heart rhythm) Praziquantel This list may not describe all possible interactions. Give your health care provider a list of all the medicines, herbs, non-prescription drugs, or dietary supplements you use. Also tell them if you smoke, drink alcohol, or use illegal drugs. Some items may interact with your medicine. What should I watch for while using this medication? Visit your care team for regular checks on your progress. Tell your care team if your symptoms do not start to get better or if they get worse. You may need blood work done while you are taking this medication. If you take other medications that can affect heart rhythm, you may need more testing. Talk to your care team if you have questions. Your vision may be tested before and during use of this medication. Tell your care team right away if you have any change in your eyesight. This medication may cause serious skin reactions. They can happen weeks to months after starting the medication. Contact your care team right away if you notice fevers or flu-like symptoms with a rash. The rash may be red or purple and then turn into blisters or peeling of the skin. Or, you might notice a red rash with swelling of the face, lips or lymph nodes in your neck or under your arms. If you or your family notice any changes in your behavior, such as new or worsening depression, thoughts of harming yourself, anxiety, or other unusual or disturbing thoughts, or memory loss, call your care team right away. What side effects may I notice from receiving this medication? Side effects that you should report to your care team as soon as possible: Allergic  reactions--skin rash, itching, hives, swelling of the face, lips, tongue, or throat Aplastic anemia--unusual weakness or fatigue, dizziness, headache, trouble breathing, increased bleeding or bruising Change in vision Heart rhythm changes--fast or irregular heartbeat, dizziness, feeling faint or lightheaded, chest pain, trouble breathing Infection--fever, chills, cough, or sore throat Low blood sugar (hypoglycemia)--tremors or shaking, anxiety, sweating, cold or clammy skin, confusion, dizziness, rapid heartbeat Muscle injury--unusual weakness or fatigue, muscle pain, dark yellow or brown urine, decrease in amount of urine Pain, tingling, or numbness in the hands or feet Rash, fever, and swollen lymph nodes Redness, blistering, peeling, or loosening of the skin, including inside the mouth Thoughts of suicide or self-harm, worsening mood, or feelings of depression Unusual bruising or bleeding Side effects that usually do not require medical attention (report to your care team if they continue or are bothersome): Diarrhea Headache Nausea Stomach pain Vomiting This list may not describe all possible side effects. Call your doctor for medical advice about side effects. You may report side effects to FDA at 1-800-FDA-1088. Where should I keep my medication? Keep out of the reach of children and pets. Store at room temperature up to 30 degrees C (86 degrees F). Protect from light. Get rid of any unused medication  after the expiration date. To get rid of medications that are no longer needed or have expired: Take the medication to a medication take-back program. Check with your pharmacy or law enforcement to find a location. If you cannot return the medication, check the label or package insert to see if the medication should be thrown out in the garbage or flushed down the toilet. If you are not sure, ask your care team. If it is safe to put it in the trash, empty the medication out of the  container. Mix the medication with cat litter, dirt, coffee grounds, or other unwanted substance. Seal the mixture in a bag or container. Put it in the trash. NOTE: This sheet is a summary. It may not cover all possible information. If you have questions about this medicine, talk to your doctor, pharmacist, or health care provider.  2022 Elsevier/Gold Standard (2021-03-09 00:00:00)

## 2021-10-18 ENCOUNTER — Ambulatory Visit: Payer: Medicaid Other | Admitting: Physician Assistant

## 2021-10-18 DIAGNOSIS — M1990 Unspecified osteoarthritis, unspecified site: Secondary | ICD-10-CM

## 2021-10-18 DIAGNOSIS — M19041 Primary osteoarthritis, right hand: Secondary | ICD-10-CM

## 2021-10-18 DIAGNOSIS — Z8679 Personal history of other diseases of the circulatory system: Secondary | ICD-10-CM

## 2021-10-18 DIAGNOSIS — D352 Benign neoplasm of pituitary gland: Secondary | ICD-10-CM

## 2021-10-18 DIAGNOSIS — I872 Venous insufficiency (chronic) (peripheral): Secondary | ICD-10-CM

## 2021-10-18 DIAGNOSIS — I201 Angina pectoris with documented spasm: Secondary | ICD-10-CM

## 2021-10-18 DIAGNOSIS — M5030 Other cervical disc degeneration, unspecified cervical region: Secondary | ICD-10-CM

## 2021-10-18 DIAGNOSIS — M5136 Other intervertebral disc degeneration, lumbar region: Secondary | ICD-10-CM

## 2021-10-18 DIAGNOSIS — G5603 Carpal tunnel syndrome, bilateral upper limbs: Secondary | ICD-10-CM

## 2021-10-18 DIAGNOSIS — Z8742 Personal history of other diseases of the female genital tract: Secondary | ICD-10-CM

## 2021-10-18 DIAGNOSIS — G894 Chronic pain syndrome: Secondary | ICD-10-CM

## 2021-10-18 DIAGNOSIS — M7918 Myalgia, other site: Secondary | ICD-10-CM

## 2021-10-18 DIAGNOSIS — M5416 Radiculopathy, lumbar region: Secondary | ICD-10-CM

## 2021-10-18 DIAGNOSIS — Z8719 Personal history of other diseases of the digestive system: Secondary | ICD-10-CM

## 2021-10-19 ENCOUNTER — Telehealth: Payer: Self-pay | Admitting: Rheumatology

## 2021-10-19 NOTE — Telephone Encounter (Signed)
Patient called the office stating she would like a printout with other treatment options. Patient stated she is concerned about side effects of the medication she was recently prescribed 10-13-21. Patient stated she would like to know about other options for treatment so she can discuss them with her primary physician and daughter.

## 2021-10-20 NOTE — Telephone Encounter (Signed)
Patient advised Virginia Shaw spoke with Dr. Estanislado Pandy.    Patient advised her arthritis is not aggressive enough for biologics at this time.  Plaquenil would be the safest choice.  Arava or methotrexate are other options if she would like to further review but both have have significantly more side effects and are more immunosuppressive than plaquenil.    Patient states she has a bladder infection and was given antibiotics. Patient states she has been having pain in her kidneys. Patient wants to clear this up prior to starting any medication. Patient will contact the office when she is ready to start medication. Patient states she will drop off a copy of her last visit with the nephrologist and her last bone density scan.

## 2022-01-01 ENCOUNTER — Other Ambulatory Visit: Payer: Self-pay

## 2022-01-01 ENCOUNTER — Encounter (HOSPITAL_COMMUNITY): Payer: Self-pay

## 2022-01-01 ENCOUNTER — Emergency Department (HOSPITAL_COMMUNITY): Payer: Medicaid Other

## 2022-01-01 ENCOUNTER — Emergency Department (HOSPITAL_COMMUNITY)
Admission: EM | Admit: 2022-01-01 | Discharge: 2022-01-01 | Disposition: A | Discharge status: Home / Self Care | Payer: Medicaid Other | Attending: Emergency Medicine | Admitting: Emergency Medicine

## 2022-01-01 DIAGNOSIS — M79605 Pain in left leg: Secondary | ICD-10-CM | POA: Diagnosis not present

## 2022-01-01 DIAGNOSIS — K920 Hematemesis: Secondary | ICD-10-CM | POA: Diagnosis not present

## 2022-01-01 DIAGNOSIS — Z79899 Other long term (current) drug therapy: Secondary | ICD-10-CM | POA: Insufficient documentation

## 2022-01-01 DIAGNOSIS — G8929 Other chronic pain: Secondary | ICD-10-CM | POA: Insufficient documentation

## 2022-01-01 DIAGNOSIS — M542 Cervicalgia: Secondary | ICD-10-CM | POA: Diagnosis not present

## 2022-01-01 DIAGNOSIS — R0789 Other chest pain: Secondary | ICD-10-CM | POA: Diagnosis not present

## 2022-01-01 DIAGNOSIS — R042 Hemoptysis: Secondary | ICD-10-CM | POA: Diagnosis not present

## 2022-01-01 DIAGNOSIS — M545 Low back pain, unspecified: Secondary | ICD-10-CM | POA: Diagnosis not present

## 2022-01-01 DIAGNOSIS — Z20822 Contact with and (suspected) exposure to covid-19: Secondary | ICD-10-CM | POA: Insufficient documentation

## 2022-01-01 DIAGNOSIS — R059 Cough, unspecified: Secondary | ICD-10-CM | POA: Insufficient documentation

## 2022-01-01 DIAGNOSIS — R0602 Shortness of breath: Secondary | ICD-10-CM | POA: Insufficient documentation

## 2022-01-01 LAB — COMPREHENSIVE METABOLIC PANEL
ALT: 23 U/L (ref 0–44)
AST: 22 U/L (ref 15–41)
Albumin: 4.1 g/dL (ref 3.5–5.0)
Alkaline Phosphatase: 61 U/L (ref 38–126)
Anion gap: 7 (ref 5–15)
BUN: 12 mg/dL (ref 6–20)
CO2: 28 mmol/L (ref 22–32)
Calcium: 8.9 mg/dL (ref 8.9–10.3)
Chloride: 103 mmol/L (ref 98–111)
Creatinine, Ser: 0.58 mg/dL (ref 0.44–1.00)
GFR, Estimated: >60 mL/min (ref >60)
Glucose, Bld: 95 mg/dL (ref 70–99)
Potassium: 3.9 mmol/L (ref 3.5–5.1)
Sodium: 138 mmol/L (ref 135–145)
Total Bilirubin: 0.4 mg/dL (ref 0.3–1.2)
Total Protein: 7.5 g/dL (ref 6.5–8.1)

## 2022-01-01 LAB — CBC WITH DIFFERENTIAL/PLATELET
Abs Immature Granulocytes: 0.02 10*3/uL (ref 0.00–0.07)
Basophils Absolute: 0.1 10*3/uL (ref 0.0–0.1)
Basophils Relative: 1 %
Eosinophils Absolute: 0.5 10*3/uL (ref 0.0–0.5)
Eosinophils Relative: 7 %
HCT: 41.4 % (ref 36.0–46.0)
Hemoglobin: 13.7 g/dL (ref 12.0–15.0)
Immature Granulocytes: 0 %
Lymphocytes Relative: 29 %
Lymphs Abs: 2.2 10*3/uL (ref 0.7–4.0)
MCH: 30.1 pg (ref 26.0–34.0)
MCHC: 33.1 g/dL (ref 30.0–36.0)
MCV: 91 fL (ref 80.0–100.0)
Monocytes Absolute: 0.6 10*3/uL (ref 0.1–1.0)
Monocytes Relative: 8 %
Neutro Abs: 4 10*3/uL (ref 1.7–7.7)
Neutrophils Relative %: 55 %
Platelets: 391 10*3/uL (ref 150–400)
RBC: 4.55 MIL/uL (ref 3.87–5.11)
RDW: 12.2 % (ref 11.5–15.5)
WBC: 7.3 10*3/uL (ref 4.0–10.5)
nRBC: 0 % (ref 0.0–0.2)

## 2022-01-01 LAB — RESP PANEL BY RT-PCR (FLU A&B, COVID) ARPGX2
Influenza A by PCR: NEGATIVE
Influenza B by PCR: NEGATIVE
SARS Coronavirus 2 by RT PCR: NEGATIVE

## 2022-01-01 LAB — TROPONIN I (HIGH SENSITIVITY)
Troponin I (High Sensitivity): <2 ng/L (ref <18)
Troponin I (High Sensitivity): <2 ng/L (ref <18)

## 2022-01-01 LAB — BRAIN NATRIURETIC PEPTIDE: B Natriuretic Peptide: 22.5 pg/mL (ref 0.0–100.0)

## 2022-01-01 LAB — D-DIMER, QUANTITATIVE: D-Dimer, Quant: 0.31 ug/mL-FEU (ref 0.00–0.50)

## 2022-01-01 NOTE — ED Triage Notes (Signed)
Pt coming from home with c/o blood in vomit, chest tightness/pain, and left sided neck pain since Saturday. Pt went to pcp this morning and they sent pt here for a more thorough workup. Pt reports only one episode of emesis with blood on Saturday. Pt has hx of hematemesis.

## 2022-01-01 NOTE — ED Provider Notes (Signed)
[facility] DEPT Provider Note   CSN: [id number] Arrival date & time: 01/01/22  1535     History  Chief Complaint  Patient presents with   Hematemesis   Chest Pain   Neck Pain    Virginia Shaw is a 56 y.o. female.  HPI       56 year old female with history of systemic sclerosis, anxiety, cervical spondylosis, irritable bowel syndrome, peripheral vascular disease, TIA, somatization disorder, pituitary cyst status post transsphenoidal surgery, angina with normal coronary arteriogram, stress test December 2022 negative for inducible ischemia, Chiari syndrome   Recently saw neurosurgery for spondylosis, benign neoplasm of pituitary gland Saw cardiology for dyspnea on exertion and had normal echo   1 week ago vomited one time, metal taste Saturday had episode of coughing up blood, had 50cent piece blood clot and a few streaks of blood and mucus Coughed it up not vomiting Started having deep cough Thursday, since Saturday has been more of a dry cough Shortness of breath with talking, exertion, not resting, does have orthopnea back and neck pain when laying flat too Left leg and groin with pain, waking up more at night, chronic, chronic with the back pain Swelling coming and going in legs  Has had some nausea but not every day and no vomiting except Sunday, has some chronically  Chest staying tight, eases up and doesn't stay consistent, stayed tight since Saturday, pain left rib, some dyspnea Has had it for a while but getting worse since Saturday CP used to come and go, not consistent, but now it is every day all day. Taking a deep breath in hurts worse, walking more dyspnea, radiation of pain to neck and back 6/10  Told to come for evaluation by her dr. No fevers No recent surgeries or travel, hx DVT No black or bloody stools  Denies TB risk factors, homelessness, incarceration, travel or exposures.  Past Medical History:   Diagnosis Date   Abnormal involuntary movements(781.0)    Allergic rhinitis, cause unspecified    Anxiety state, unspecified    Cervical spondylosis without myelopathy    Chronic headaches    Disturbance of skin sensation    Fatty liver    per patient, "not sure but does remember being told"   GERD (gastroesophageal reflux disease)    History of chicken pox    Irritable bowel syndrome    Low blood pressure    Lumbago    Myalgia and myositis, unspecified    Osteoporosis    Other and unspecified angina pectoris    Other malaise and fatigue    Peripheral vascular disease ([place])    per patient "diagnosed with it about 13-14 years ago and does not currently see someone for it, saw Dr. 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 Authorizing Provider  amLODipine (NORVASC) 5 MG tablet Take 5 mg by mouth daily.     [provider]  CINNAMON PO Take by mouth. Adds to tea daily    [provider]  Ginger, Zingiber officinalis, (GINGER  PO) Take by mouth. Adds to tea daily    [provider]  isosorbide mononitrate (IMDUR) 30 MG 24 hr tablet Take 60 mg by mouth daily.    [provider]  metoprolol succinate (TOPROL-XL) 25 MG 24 hr tablet Take 12.5 mg by mouth daily.    [provider]  Misc Natural Products (TART CHERRY ADVANCED) CAPS Take 1 capsule by mouth every other day.     [provider]  nitroGLYCERIN (NITROSTAT) 0.4 MG SL tablet Place 0.4 mg under the tongue every 5 (five) minutes x 3 doses as needed for chest pain.  12/24/18    [provider]  Polyethyl Glycol-Propyl Glycol (SYSTANE) 0.4-0.3 % SOLN Place 1 drop into both eyes in the morning and at bedtime.    [provider]      Allergies    Iodinated contrast media, Penicillins, Prednisone, Propoxyphene, Acetaminophen, Benzonatate, Buprenorphine hcl, Citric acid, Clindamycin, Codeine, Diazepam, Diclofenac, Esomeprazole, Morphine and related, Nsaids, Pantoprazole sodium, Ceftin [cefuroxime axetil], Cephalexin, Cortisone, Diltiazem, Doxycycline, Hydrocodone-acetaminophen, Ibuprofen, Lidocaine, Loracarbef, Macrobid [nitrofurantoin monohyd macro], Maxalt [rizatriptan], Methylprednisolone sodium succ, Naproxen, Omeprazole, Oxycodone-acetaminophen, Phenazopyridine hcl, Tomato, and Budesonide    Review of Systems   Review of Systems See above  Physical Exam Updated Vital Signs BP 114/81    Pulse 68    Temp 98.5 F (36.9 C) (Oral)    Resp 15    Ht 5' (1.524 m)    Wt 64.9 kg    SpO2 100%    BMI 27.93 kg/m  Physical Exam Vitals and nursing note reviewed.  Constitutional:      General: She is not in acute distress.    Appearance: She is well-developed. She is not diaphoretic.  HENT:     Head: Normocephalic and atraumatic.  Eyes:     Conjunctiva/sclera: Conjunctivae normal.  Cardiovascular:     Rate and Rhythm: Normal rate and regular rhythm.     Heart sounds: Normal heart sounds. No murmur heard.   No friction rub. No gallop.  Pulmonary:     Effort: Pulmonary effort is normal. No respiratory distress.     Breath sounds: Normal breath sounds. No wheezing or rales.  Abdominal:     General: There is no distension.     Palpations: Abdomen is soft.     Tenderness: There is no abdominal tenderness. There is no guarding.  Musculoskeletal:        General: No tenderness.     Cervical back: Normal range of motion.  Skin:    General: Skin is warm and dry.     Findings: No erythema or rash.  Neurological:     Mental Status: She is alert and oriented  to person, place, and time.    ED Results / Procedures / Treatments   Labs (all labs ordered are listed, but only abnormal results are displayed) Labs Reviewed  RESP PANEL BY RT-PCR (FLU A&B, COVID) ARPGX2  CBC WITH DIFFERENTIAL/PLATELET  COMPREHENSIVE METABOLIC PANEL  D-DIMER, QUANTITATIVE  BRAIN NATRIURETIC PEPTIDE  TROPONIN I (HIGH SENSITIVITY)  TROPONIN I (HIGH SENSITIVITY)    EKG EKG Interpretation  Date/Time:  Monday January 01 2022 15:51:51 EST Ventricular Rate:  75 PR Interval:  152 QRS Duration: 75 QT Interval:  380 QTC Calculation: 425 R Axis:   58 Text Interpretation: Sinus rhythm Low voltage, precordial leads No significant change since last tracing Confirmed by Gareth Morgan [phone]) on 01/01/2022 6:24:42 PM  Radiology DG Chest Portable 1 View  Result Date: 01/01/2022 CLINICAL  DATA:  Chest pain. EXAM: PORTABLE CHEST 1 VIEW COMPARISON:  January 01, 2022 FINDINGS: The heart size and mediastinal contours are within normal limits. Both lungs are clear. The visualized skeletal structures are unremarkable. IMPRESSION: No active disease. Electronically Signed   By: Dorise Bullion III M.D.   On: 01/01/2022 16:42    Procedures Procedures    Medications Ordered in ED Medications - No data to display  ED Course/ Medical Decision Making/ A&P   56 year old female with history of systemic sclerosis, anxiety, cervical spondylosis, irritable bowel syndrome, peripheral vascular disease, TIA, somatization disorder, pituitary cyst status post transsphenoidal surgery, angina with normal coronary arteriogram, stress test December 2022 negative for inducible ischemia, Chiari syndrome who presents with concern for hemoptysis, chest tightness, dyspnea.  Note that triage note describes hematemesis, but on repeat questioning patient denies any hematemesis and reports only hemoptysis.  Differential diagnosis for chest pain includes pulmonary embolus, dissection, pneumothorax,  pneumonia, ACS, myocarditis, pericarditis.  EKG was done and evaluate by me and showed no acute ST changes and no signs of pericarditis. Chest x-ray was done and evaluated by me and radiology and showed no sign of pneumonia, edema, mass or pneumothorax.  Considered CT, but she has an allergy to contrast as well as steroids, and given that she is low risk [place], had hemoptysis in the setting of preceding cough for a few days, feel D-dimer testing is appropriate.  D-dimer negative and have low suspicion for PE.  Do not feel history or exam are consistent with aortic dissection.  Delta troponins both negative, with patient having a history of angina with normal coronary arteriogram, normal stress test in December 2022, and do not feel admission for further cardiac work-up is indicated at this time.  BNP is within normal limits, normal heart size and no pulmonary edema on x-ray and have low suspicion for CHF.  Labs personally evaluated by me and show no anemia, no significant electrolyte abnormalities, negative troponins as noted above.  She has seen pulmonology for hemoptysis in May 2021.  While it is possible her hemoptysis may be secondary to bronchitis, recommend follow-up with her pulmonologist Dr. Vaughan Browner for further evaluation.            Final Clinical Impression(s) / ED Diagnoses Final diagnoses:  Hemoptysis    Rx / DC Orders ED Discharge Orders     None         Gareth Morgan, MD 01/01/22 2055

## 2022-01-05 ENCOUNTER — Ambulatory Visit (INDEPENDENT_AMBULATORY_CARE_PROVIDER_SITE_OTHER): Payer: Medicaid Other | Admitting: Adult Health

## 2022-01-05 ENCOUNTER — Telehealth: Payer: Self-pay | Admitting: Rheumatology

## 2022-01-05 ENCOUNTER — Encounter: Payer: Self-pay | Admitting: Adult Health

## 2022-01-05 ENCOUNTER — Other Ambulatory Visit: Payer: Self-pay

## 2022-01-05 DIAGNOSIS — R042 Hemoptysis: Secondary | ICD-10-CM

## 2022-01-05 MED ORDER — ALBUTEROL SULFATE HFA 108 (90 BASE) MCG/ACT IN AERS: 1.0000 | INHALATION_SPRAY | Freq: Four times a day (QID) | RESPIRATORY_TRACT | 2 refills | Status: DC | PRN

## 2022-01-05 NOTE — Patient Instructions (Signed)
Begin Claritin 5mg  daily for 1 week , then as needed.  ?Saline nasal rinses Twice daily   ?Delsym 2 tsp Twice daily  As needed  cough ?Albuterol inhaler 1-2 puffs every 6hr as needed for wheezing/shortness of breath .  ?Follow up with with Dr. Vaughan Browner in 6 weeks with PFT and As needed   ?Please contact office for sooner follow up if symptoms do not improve or worsen or seek emergency care  ? ?

## 2022-01-05 NOTE — Assessment & Plan Note (Signed)
Episode of hemoptysis greater than 1 week ago with no recurrence.  Chest x-ray was clear emergency work-up with negative D-dimer and BNP. ?Patient has a history of recurrent hemoptysis questionable etiology.  Possibly recent upper respiratory infection now improving. ?We will check PFTs on return.  Questionable component of reactive airways or asthma patient may use Claritin and Delsym as needed ?If hemoptysis returns could consider bronchoscopy ? ?Plan  ?Patient Instructions  ?Begin Claritin 5mg  daily for 1 week , then as needed.  ?Saline nasal rinses Twice daily   ?Delsym 2 tsp Twice daily  As needed  cough ?Albuterol inhaler 1-2 puffs every 6hr as needed for wheezing/shortness of breath .  ?Follow up with with Dr. Vaughan Browner in 6 weeks with PFT and As needed   ?Please contact office for sooner follow up if symptoms do not improve or worsen or seek emergency care  ? ?  ? ?

## 2022-01-05 NOTE — Telephone Encounter (Signed)
I returned patient's call after reviewing her chart.  She was seen in the emergency room for hemoptysis.  She was evaluated at the pulmonary office today.  She also has CT scan of her chest and PFTs  scheduled.  She has an appointment with Dr. Vaughan Browner in April.  Patient states after her visit in December 2022 she did not start hydroxychloroquine.  She continues to have some joint pain.  I advised call us in case her symptoms get worse.  Otherwise we will see her after evaluation by Dr. Vaughan Browner.  Please a schedule a follow-up appointment in May.

## 2022-01-05 NOTE — Progress Notes (Signed)
@Patient  ID: Virginia Shaw, female    DOB: Nov 15, 1965, 56 y.o.   MRN: [id number]  Chief Complaint  Patient presents with   Follow-up   Hospitalization Follow-up    Referring provider: [name], MD  HPI: 56 year old female never smoker followed for recurrent hemoptysis.  Followed by Rheumatology for chronic inflammatory arthritis .  Medical history significant for pituitary cyst status post resection, Chiari syndrome  TEST/EVENTS :  Pets: Has a dog.  No cats, birds, farm animals Occupation: Used to work at a [employer] until 2007.  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  No current ongoing exposures.  Denies any mold, hot tub, Jacuzzi, humidifier Smoking history: Never smoker Travel history: No significant travel history Relevant family history: There is significant family history of lung disease.  Her aunt had a lung transplant.  She is not clear why the transplant was needed.. Several family members have lung cancer, mesothelioma, COPD.  01/05/2022 Follow up : Hemoptysis Patient returns for a emergency room follow-up.  She was last seen in our office in May 2022 for recurrent hemoptysis.  Was seen in rheumatology as there was concern for possible underlying vasculitis.  Patient has been diagnosed with chronic inflammatory arthritis.  Patient says overall over the last 2 years she has been doing okay with no significant hemoptysis.  However she was recently seen in the emergency room on January 01, 2022 with episode of hemoptysis.  Emergency room notes were reviewed .chest x-ray  showed clear lungs.  D-dimer was negative.  BNP was normal.  Troponin and respiratory panel were negative.  Patient says it started about 2 weeks ago when she developed a cough and congestion with thick mucus.  She had 1 episode where she coughed up a blood-tinged mucus.  Had no further episodes.  She says she is has  some residual tightness and shortness of breath intermittently.  She also has been having some seasonal allergies with postnasal drainage.  Patient is not on any blood thinners.  She denies any chest pain orthopnea PND or leg swelling.   Allergies  Allergen Reactions   Iodinated Contrast Media Hives and Itching    Throat scratchiness mild *Pt needs to receive a 13 hour prednisone prep prior to future CT scans containing Iodinated contrast media* Patient has prednisone allergy as well!! Throat scratchiness   MRI causes severe headache, swelling,N & V,  bad sore throat and chest pain  Also caused kidney swelling and pain   Penicillins Anaphylaxis and Rash    Did it involve swelling of the face/tongue/throat, SOB, or low BP? Yes Did it involve sudden or severe rash/hives, skin peeling, or any reaction on the inside of your mouth or nose? Yes Did you need to seek medical attention at a hospital or doctor's office? Yes When did it last happen?    don't remember  If all above answers are NO, may proceed with cephalosporin use.    Prednisone Other (See Comments)    Chest pain and headache. Bladder spasms, chest spasms, increase blood pressure    Propoxyphene Other (See Comments)    Chest pain.     Acetaminophen Other (See Comments)    Cannot take adult strength tylenol, it cause chest pain. Can take 1/4 tsp of Children's Tylenol only without any chest pain.   Benzonatate Other (See Comments)    Chest pain.   Buprenorphine Hcl Other (See Comments)  Bladder spasms   Citric Acid Hives and Swelling    Fever blisters in mouth   Clindamycin Other (See Comments)    Chest tightness   Codeine Other (See Comments)    Chest pain.   Diazepam Other (See Comments)    Chest pain.   Diclofenac Other (See Comments)    Rapid heartbeat   Esomeprazole Other (See Comments)    Chest pain and rectal bleeding.   Morphine And Related Other (See Comments)    Bladder spasms    Nsaids Other (See  Comments)    Bladder spasms, chest pain    Pantoprazole Sodium Other (See Comments)    Chest spasms    Ceftin [Cefuroxime Axetil]    Cephalexin    Cortisone Other (See Comments)    unknown reaction   Diltiazem    Doxycycline    Hydrocodone-Acetaminophen     Chest pain.   Ibuprofen    Lidocaine Other (See Comments)    No reaction noted   Loracarbef Other (See Comments)    All over   Macrobid [Nitrofurantoin Monohyd Macro] Other (See Comments)    Numbness in mouth   Maxalt [Rizatriptan]    Methylprednisolone Sodium Succ Other (See Comments)    unknown reaction   Naproxen     Chest pain   Omeprazole    Oxycodone-Acetaminophen     Chest pain. (incontinence)   Phenazopyridine Hcl Other (See Comments)    Unknown reaction   Tomato Hives   Budesonide Other (See Comments)    fatigued    Immunization History  Administered Date(s) Administered   Tdap 10/08/2018    Past Medical History:  Diagnosis Date   Abnormal involuntary movements(781.0)    Allergic rhinitis, cause unspecified    Anxiety state, unspecified    Cervical spondylosis without myelopathy    Chronic headaches    Disturbance of skin sensation    Fatty liver    per patient, "not sure but does remember being told"   GERD (gastroesophageal reflux disease)    History of chicken pox    Irritable bowel syndrome    Low blood pressure    Lumbago    Myalgia and myositis, unspecified    Osteoporosis    Other and unspecified angina pectoris    Other malaise and fatigue    Peripheral vascular disease ([place])    per patient "diagnosed with it about 13-14 years ago and does not currently see someone for it, saw Dr. 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      CINNAMON PO Take by mouth. Adds to tea daily     Ginger, Zingiber officinalis, (GINGER PO) Take by mouth. Adds to tea daily     isosorbide mononitrate (IMDUR) 30 MG 24 hr tablet Take 60 mg by mouth daily.     metoprolol succinate (TOPROL-XL) 25 MG 24 hr  tablet Take 12.5 mg by mouth daily.     Misc Natural Products (TART CHERRY ADVANCED) CAPS Take 1 capsule by mouth every other day.      nitroGLYCERIN (NITROSTAT) 0.4 MG SL tablet Place 0.4 mg under the tongue every 5 (five) minutes x 3 doses as needed for chest pain.      Polyethyl Glycol-Propyl Glycol (SYSTANE) 0.4-0.3 % SOLN Place 1 drop into both eyes in the morning and at bedtime.     No facility-administered medications prior to visit.     Review of Systems:   Constitutional:   No  weight loss, night sweats,  Fevers, chills,  +fatigue, or  lassitude.  HEENT:   No headaches,  Difficulty swallowing,  Tooth/dental problems, or  Sore throat,                No sneezing, itching, ear ache, nasal congestion, post nasal drip,   CV:  No chest pain,  Orthopnea, PND, swelling in lower extremities, anasarca, dizziness, palpitations, syncope.   GI  No heartburn, indigestion, abdominal pain, nausea, vomiting, diarrhea, change in bowel habits, loss of appetite, bloody stools.   Resp: .  No chest wall deformity  Skin: no rash or lesions.  GU: no dysuria, change in color of urine, no urgency or frequency.  No flank pain, no hematuria   MS:  No joint pain or swelling.  No decreased range of motion.  No back pain.    Physical Exam  BP 100/70 (BP Location: Left Arm, Patient Position: Sitting, Cuff Size:  Normal)    Pulse 83    Temp 98.3 F (36.8 C) (Oral)    Ht 5' (1.524 m)    Wt 143 lb 4.8 oz (65 kg)    SpO2 97%    BMI 27.99 kg/m   GEN: A/Ox3; pleasant , NAD, well nourished    HEENT:  [place]/AT,  EACs-clear, TMs-wnl, NOSE-clear, THROAT-clear, no lesions, no postnasal drip or exudate noted.   NECK:  Supple w/ fair ROM; no JVD; normal carotid impulses w/o bruits; no thyromegaly or nodules palpated; no lymphadenopathy.    RESP  Clear  P & A; w/o, wheezes/ rales/ or rhonchi. no accessory muscle use, no dullness to percussion  CARD:  RRR, no m/r/g, no peripheral edema, pulses intact, no cyanosis or clubbing.  GI:   Soft & nt; nml bowel sounds; no organomegaly or masses detected.   Musco: Warm bil, no deformities or joint swelling noted.   Neuro: alert, no focal deficits noted.    Skin: Warm, no lesions or rashes    Lab Results:  CBC    Component Value Date/Time   WBC 7.3 01/01/2022 1627   RBC 4.55 01/01/2022 1627   HGB 13.7 01/01/2022 1627   HCT 41.4 01/01/2022 1627   PLT 391 01/01/2022 1627   MCV 91.0 01/01/2022 1627   MCH 30.1 01/01/2022 1627   MCHC 33.1 01/01/2022 1627   RDW 12.2 01/01/2022 1627   LYMPHSABS 2.2 01/01/2022 1627   MONOABS 0.6 01/01/2022 1627   EOSABS 0.5 01/01/2022 1627   BASOSABS 0.1 01/01/2022 1627    BMET    Component Value Date/Time   NA 138 01/01/2022 1627   NA 137 08/25/2014 1140   K 3.9 01/01/2022 1627   CL 103 01/01/2022 1627   CO2 28 01/01/2022 1627   GLUCOSE 95 01/01/2022 1627   BUN 12 01/01/2022 1627   BUN 13 08/25/2014 1140   CREATININE 0.58 01/01/2022 1627   CALCIUM 8.9 01/01/2022  [place] 01/01/2022 1627   GFRAA >60 04/21/2020 1404    BNP    Component Value Date/Time   BNP 22.5 01/01/2022 1629    ProBNP No results found for: PROBNP  Imaging: DG Chest Portable 1 View  Result Date: 01/01/2022 CLINICAL DATA:  Chest pain. EXAM: PORTABLE CHEST 1 VIEW COMPARISON:  January 01, 2022 FINDINGS: The heart size and  mediastinal contours are within normal limits. Both lungs are clear. The visualized skeletal structures are unremarkable. IMPRESSION: No active disease. Electronically Signed   By: Dorise Bullion III M.D.   On: 01/01/2022 16:42      No flowsheet data found.  No results found for: NITRICOXIDE      Assessment & Plan:   Hemoptysis Episode of hemoptysis greater than 1 week ago with no recurrence.  Chest x-ray was clear emergency work-up with negative D-dimer and BNP. Patient has a history of recurrent hemoptysis questionable etiology.  Possibly recent upper respiratory infection now improving. We will check PFTs on return.  Questionable component of reactive airways or asthma patient may use Claritin and Delsym as needed If hemoptysis returns could consider bronchoscopy  Plan  Patient Instructions  Begin Claritin 5mg  daily for 1 week , then as needed.  Saline nasal rinses Twice daily   Delsym 2 tsp Twice daily  As needed  cough Albuterol inhaler 1-2 puffs every 6hr as needed for wheezing/shortness of breath .  Follow up with with Dr. Vaughan Browner in 6 weeks with PFT and As needed   Please contact office for sooner follow up if symptoms do not improve or worsen or seek emergency care       I spent   30 minutes dedicated to the care of this patient on the date of this encounter to include pre-visit review of records, face-to-face time with the patient discussing conditions above, post visit ordering of testing, clinical documentation with the electronic health record, making appropriate referrals as documented, and communicating necessary findings to members of the patients care team.    Rexene Edison, NP 01/05/2022

## 2022-01-05 NOTE — Telephone Encounter (Signed)
Patient called the office stating she was seen in the ED on 2/27. Patient states she was diagnosed with something and she would like Dr. Estanislado Pandy to review her chart and see what she thinks about that diagnosis. ?

## 2022-01-12 ENCOUNTER — Telehealth: Payer: Self-pay | Admitting: Adult Health

## 2022-01-12 ENCOUNTER — Ambulatory Visit (INDEPENDENT_AMBULATORY_CARE_PROVIDER_SITE_OTHER): Payer: Medicaid Other | Admitting: Emergency Medicine

## 2022-01-12 ENCOUNTER — Encounter: Payer: Self-pay | Admitting: Emergency Medicine

## 2022-01-12 ENCOUNTER — Other Ambulatory Visit: Payer: Self-pay

## 2022-01-12 DIAGNOSIS — R0789 Other chest pain: Secondary | ICD-10-CM

## 2022-01-12 DIAGNOSIS — R042 Hemoptysis: Secondary | ICD-10-CM

## 2022-01-12 NOTE — Progress Notes (Signed)
Subjective:    Patient ID: Virginia Shaw, female    DOB: 1966-08-01, 56 y.o.   MRN: [id number]  HPI This is an acute visit for Virginia Shaw, 56 year old woman has been followed by Dr. Vaughan Browner for hemoptysis.  Her history is well summarized by Dr. Matilde Bash notes (see 06/28/2020) and includes evaluation for nephropathy with biopsy of the did not show any overt vasculitis, revealed thin membrane GBM disease.  MPO, c-ANCA, p-ANCA anti-GBM and ANA are all negative.  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  It looks like bronchoscopy was recommended 06/2020, not done. She was seen here 1 week ago after a single episode of sputum streaked with blood.  Returns now reporting that she had a mucous mixed w bright red, quarter-sized, cleared from throat at mid-day about 2 weeks ago. Saw TP for this and some chest tightness 3/3. Then this am she cleared her throat when she woke up and had mucous with some strings of blood. She may have had occasions in the past when she sniffs nasosinus mucous and sees blood - not sure that this has been the case on this occasion.   Chest x-ray 01/01/2022 reviewed by me shows no infiltrates or any evidence of active disease  CT scan of the chest 05/17/2020 reviewed by me shows no mediastinal or hilar lymphadenopathy, normal esophagus, no bronchiectasis or bronchial wall thickening.  There is a tiny calcified left lower lobe granuloma.  No masses or other nodules.  No consolidation   Review of Systems As per HPI  Past Medical History:  Diagnosis Date   Abnormal involuntary movements(781.0)    Allergic rhinitis, cause unspecified    Anxiety state, unspecified    Cervical spondylosis without myelopathy    Chronic headaches    Disturbance of skin sensation    Fatty liver    per patient, "not sure but does remember being told"   GERD (gastroesophageal reflux disease)    History of chicken pox    Irritable bowel syndrome    Low blood pressure     Lumbago    Myalgia and myositis, unspecified    Osteoporosis    Other and unspecified angina pectoris    Other malaise and fatigue    Peripheral vascular disease ([place])    per patient "diagnosed with it about 13-14 years ago and does not currently see someone for it, saw Dr. 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   Shortness of breath on exertion    per patient, "sometimes depends on distance"   Somatization disorder    Spasm of muscle    Systemic sclerosis ([place])    TIA (transient ischemic attack)    Unspecified hereditary and idiopathic peripheral neuropathy      Family History  Problem Relation Age of Onset   Hypertension Father    Hyperlipidemia Father    Diabetes Father    Stroke Father    Cancer Maternal Uncle        lung   Cancer Paternal Aunt        lung   Migraines Mother    Heart attack Paternal Grandmother    Heart attack Paternal Grandfather    Hypertension Paternal Grandfather    Migraines Sister      Social History   Socioeconomic History   Marital status: Legally Separated    Spouse name: Not on file   Number of children: 2  Years of education: GED   Highest education level: Not on file  Occupational History    Employer: UNEMPLOYED  Tobacco Use   Smoking status: Never   Smokeless tobacco: Never  Vaping Use   Vaping Use: Never used  Substance and Sexual Activity   Alcohol use: No   Drug use: No   Sexual activity: Never    Birth control/protection: Abstinence  Other Topics Concern   Not on file  Social History Narrative   Lives at home with children. ( 2 daughters)   Regular exercise-yes   Caffeine Use-no   GED   Not Working outside home   Social Determinants of Radio broadcast assistant Strain: Not on file  Food Insecurity: Not on file  Transportation Needs: Not on file  Physical Activity: Not on file  Stress: Not on file  Social Connections: Not on file  Intimate  Partner Violence: Not on file     Allergies  Allergen Reactions   Iodinated Contrast Media Hives and Itching    Throat scratchiness mild *Pt needs to receive a 13 hour prednisone prep prior to future CT scans containing Iodinated contrast media* Patient has prednisone allergy as well!! Throat scratchiness   MRI causes severe headache, swelling,N & V,  bad sore throat and chest pain  Also caused kidney swelling and pain   Penicillins Anaphylaxis and Rash    Did it involve swelling of the face/tongue/throat, SOB, or low BP? Yes Did it involve sudden or severe rash/hives, skin peeling, or any reaction on the inside of your mouth or nose? Yes Did you need to seek medical attention at a hospital or doctor's office? Yes When did it last happen?    don't remember  If all above answers are NO, may proceed with cephalosporin use.    Prednisone Other (See Comments)    Chest pain and headache. Bladder spasms, chest spasms, increase blood pressure    Propoxyphene Other (See Comments)    Chest pain.     Acetaminophen Other (See Comments)    Cannot take adult strength tylenol, it cause chest pain. Can take 1/4 tsp of Children's Tylenol only without any chest pain.   Benzonatate Other (See Comments)    Chest pain.   Buprenorphine Hcl Other (See Comments)    Bladder spasms   Citric Acid Hives and Swelling    Fever blisters in mouth   Clindamycin Other (See Comments)    Chest tightness   Codeine Other (See Comments)    Chest pain.   Diazepam Other (See Comments)    Chest pain.   Diclofenac Other (See Comments)    Rapid heartbeat   Esomeprazole Other (See Comments)    Chest pain and rectal bleeding.   Morphine And Related Other (See Comments)    Bladder spasms    Nsaids Other (See Comments)    Bladder spasms, chest pain    Pantoprazole Sodium Other (See Comments)    Chest spasms    Ceftin [Cefuroxime Axetil]    Cephalexin    Cortisone Other (See Comments)    unknown reaction    Diltiazem    Doxycycline    Hydrocodone-Acetaminophen     Chest pain.   Ibuprofen    Lidocaine Other (See Comments)    No reaction noted   Loracarbef Other (See Comments)    All over   Macrobid [Nitrofurantoin Monohyd Macro] Other (See Comments)    Numbness in mouth   Maxalt [Rizatriptan]    Methylprednisolone Sodium Succ  Other (See Comments)    unknown reaction   Naproxen     Chest pain   Omeprazole    Oxycodone-Acetaminophen     Chest pain. (incontinence)   Phenazopyridine Hcl Other (See Comments)    Unknown reaction   Tomato Hives   Budesonide Other (See Comments)    fatigued     Outpatient Medications Prior to Visit  Medication Sig Dispense Refill   albuterol (VENTOLIN HFA) 108 (90 Base) MCG/ACT inhaler Inhale 1-2 puffs into the lungs every 6 (six) hours as needed. 8 g 2   amLODipine (NORVASC) 5 MG tablet Take 5 mg by mouth daily.      CINNAMON PO Take by mouth. Adds to tea daily     Ginger, Zingiber officinalis, (GINGER PO) Take by mouth. Adds to tea daily     isosorbide mononitrate (IMDUR) 30 MG 24 hr tablet Take 60 mg by mouth daily.     metoprolol succinate (TOPROL-XL) 25 MG 24 hr tablet Take 12.5 mg by mouth daily.     Misc Natural Products (TART CHERRY ADVANCED) CAPS Take 1 capsule by mouth every other day.      nitroGLYCERIN (NITROSTAT) 0.4 MG SL tablet Place 0.4 mg under the tongue every 5 (five) minutes x 3 doses as needed for chest pain.      Polyethyl Glycol-Propyl Glycol (SYSTANE) 0.4-0.3 % SOLN Place 1 drop into both eyes in the morning and at bedtime.     No facility-administered medications prior to visit.         Objective:   Physical Exam Vitals:   01/12/22 1019  BP: 116/76  Pulse: 68  Temp: 98.2 F (36.8 C)  TempSrc: Oral  SpO2: 97%  Weight: 143 lb 6.4 oz (65 kg)  Height: 5' (1.524 m)    Gen: Pleasant, well-nourished, in no distress,  normal affect  ENT: No lesions,  mouth clear,  oropharynx clear, no postnasal drip, significant  bilateral turbinate erythema without any overt blood seen  Neck: No JVD, no stridor  Lungs: No use of accessory muscles, no crackles or wheezing on normal respiration, no wheeze on forced expiration  Cardiovascular: RRR, heart sounds normal, no murmur or gallops, no peripheral edema  Musculoskeletal: No deformities, no cyanosis or clubbing  Neuro: alert, awake, non focal  Skin: Warm, no lesions or rash      Assessment & Plan:   Hemoptysis Based on her description and intermittent symptoms this may be blood coming from her nasal or sinus region.  She does have turbinate erythema on exam although I did not see any overt blood in the nasal passages.  Dr. Vaughan Browner has recommended bronchoscopy in the past, not yet done.  The patient is now agreeable to proceed.  I will discuss with Dr. Vaughan Browner and help organize.  Chest tightness Pulmonary function testing has been ordered for mid April.  Time spent 30 minutes  Baltazar Apo, MD, PhD 01/12/2022, 10:37 AM [facility] [phone] or if no answer before 7:00PM call [phone] For any issues after 7:00PM please call eLink [phone]

## 2022-01-12 NOTE — Assessment & Plan Note (Signed)
Pulmonary function testing has been ordered for mid April. ?

## 2022-01-12 NOTE — Assessment & Plan Note (Signed)
Based on her description and intermittent symptoms this may be blood coming from her nasal or sinus region.  She does have turbinate erythema on exam although I did not see any overt blood in the nasal passages.  Dr. Vaughan Browner has recommended bronchoscopy in the past, not yet done.  The patient is now agreeable to proceed.  I will discuss with Dr. Vaughan Browner and help organize. ?

## 2022-01-12 NOTE — Telephone Encounter (Signed)
Spoke with the pt  ?She states woke up with hemoptysis  ?OV with Dr Lamonte Sakai at 10:15 for eval  ?Seek emergent care if symptoms get worse  ?Nothing further needed ?

## 2022-01-12 NOTE — Patient Instructions (Signed)
Get your pulmonary function testing as planned in April ?Dr. Lamonte Sakai will discuss your symptoms with Dr. Vaughan Browner.  Recommend that you have bronchoscopy to inspect airways and determine any source or cause of bleeding.  We will work on getting bronchoscopy and follow-up set up with Dr. Vaughan Browner ? ?

## 2022-01-15 ENCOUNTER — Telehealth: Payer: Self-pay | Admitting: Emergency Medicine

## 2022-01-15 NOTE — Telephone Encounter (Signed)
Spoke with the pt  ?She states that she discussed with her daughters having the bronch and she decided against this  ?She wants to do the PFT in April and go from there  ?She states her daughter listened to her lung sounds and she was clear so they do not see need for bronch  ?I explained to her that the bronch is totally different test than PFT  ?She will talk to daughters again and call back once she makes her final decision  ?

## 2022-01-23 ENCOUNTER — Ambulatory Visit: Payer: Medicaid Other | Attending: Neurosurgery | Admitting: Physical Therapy

## 2022-01-23 ENCOUNTER — Other Ambulatory Visit: Payer: Self-pay

## 2022-01-23 DIAGNOSIS — R252 Cramp and spasm: Secondary | ICD-10-CM | POA: Diagnosis present

## 2022-01-23 DIAGNOSIS — M542 Cervicalgia: Secondary | ICD-10-CM | POA: Insufficient documentation

## 2022-01-23 DIAGNOSIS — R293 Abnormal posture: Secondary | ICD-10-CM | POA: Diagnosis present

## 2022-01-23 DIAGNOSIS — M6281 Muscle weakness (generalized): Secondary | ICD-10-CM | POA: Diagnosis present

## 2022-01-23 NOTE — Therapy (Signed)
[place] [facility] ?[address] ?[place], [place], [address] ?Phone: [phone]   Fax:  [phone] ? ?Physical Therapy Evaluation ? ?Patient Details  ?Name: Virginia Shaw ?MRN: [id number] ?Date of Birth: [birth year] ?Referring Provider (PT): Pool, Mallie Mussel ? ? ?Encounter Date: 01/23/2022 ? ? PT End of Session - 01/23/22 1113   ? ? Visit Number 1   ? Number of Visits 10   ? Date for PT Re-Evaluation 04/03/22   ? Authorization Type [employer] Medicaid   ? Progress Note Due on Visit 10   ? PT Start Time 0805   ? PT Stop Time 0850   ? PT Time Calculation (min) 45 min   ? Activity Tolerance Patient tolerated treatment well;Patient limited by pain   ? Behavior During Therapy [facility] for tasks assessed/performed   ? ?  ?  ? ?  ? ? ?Past Medical History:  ?Diagnosis Date  ? Abnormal involuntary movements(781.0)   ? Allergic rhinitis, cause unspecified   ? Anxiety state, unspecified   ? Cervical spondylosis without myelopathy   ? Chronic headaches   ? Disturbance of skin sensation   ? Fatty liver   ? per patient, "not sure but does remember being told"  ? GERD (gastroesophageal reflux disease)   ? History of chicken pox   ? Irritable bowel syndrome   ? Low blood pressure   ? Lumbago   ? Myalgia and myositis, unspecified   ? Osteoporosis   ? Other and unspecified angina pectoris   ? Other malaise and fatigue   ? Peripheral vascular disease ([place])   ? per patient "diagnosed with it about 13-14 years ago and does not currently see someone for it, saw Dr. Vista Lawman in the past"  ? PONV (postoperative nausea and vomiting)   ? 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 Shortness of breath on exertion   ? per patient, "sometimes depends on distance"  ? Somatization disorder   ? Spasm of muscle   ? Systemic sclerosis ([place])   ? TIA (transient ischemic attack)   ? Unspecified hereditary and idiopathic peripheral neuropathy   ? ? ?Past Surgical History:  ?Procedure Laterality Date  ?  ABDOMINAL HYSTERECTOMY  1997  ? arnold chiari malformation  2013  ? BACK SURGERY  2001, 2011,2013  ? Lumbar x 2  ? BALLOON DILATION N/A 12/22/2019  ? Procedure: BALLOON DILATION;  Surgeon: Wilford Corner, MD;  Location: [facility];  Service: Endoscopy;  Laterality: N/A;  ? barium swallow test  10/2019  ? BLADDER SURGERY  2011  ? CARDIAC CATHETERIZATION    ? CARPAL TUNNEL RELEASE Left   ? CATARACT EXTRACTION    ? CERVICAL SPINE SURGERY  2013  ? CHOLECYSTECTOMY    ? COLONOSCOPY  2009  ? "Normal" Dr Sharlett Iles  ? COLONOSCOPY  2002  ? "Normal" Dr Freddrick March  ? COLONOSCOPY  ~2013  ? Dr Ferdinand Lango.  "Chest pain with the prep" = aborted  ? COLONOSCOPY WITH PROPOFOL N/A 03/29/2020  ? Procedure: COLONOSCOPY WITH PROPOFOL;  Surgeon: Wilford Corner, MD;  [facility];  Service: Endoscopy;  Laterality: N/A;  ? CRANIECTOMY SUBOCCIPITAL W/ CERVICAL LAMINECTOMY / CHIARI  2012  ? Dr Annette Stable, Chiari decompression with suboccipital craniectomy and  ? CYSTOCELE REPAIR  2002  ? Dr Gaetano Net  ? ESOPHAGOGASTRODUODENOSCOPY (EGD) WITH PROPOFOL N/A 12/22/2019  ? Procedure: ESOPHAGOGASTRODUODENOSCOPY (EGD) WITH PROPOFOL;  Surgeon: Wilford Corner, MD;  [facility];  Service: Endoscopy;  Laterality: N/A;  ?  INSERTION OF MESH Bilateral 10/04/2020  ? Procedure: INSERTION OF MESH;  Surgeon: Ralene Ok, MD;  Location: [place];  Service: General;  Laterality: Bilateral;  ? [place], 2000  ? PITUITARY EXCISION  2010  ? pituitary cyst removed  ? RECTOCELE REPAIR  2002  ? Dr. Gaetano Net  ? RENAL BIOPSY  08/04/2019  ? tear duct tubes  2005  ? TUBAL LIGATION    ? XI ROBOTIC ASSISTED INGUINAL HERNIA REPAIR WITH MESH Bilateral 10/04/2020  ? Procedure: XI ROBOTIC ASSISTED BILATERAL INGUINAL HERNIA REPAIR WITH MESH;  Surgeon: Ralene Ok, MD;  Location: [place];  Service: General;  Laterality: Bilateral;  ? ? ?There were no vitals filed for this visit. ? ? ? Subjective Assessment - 01/23/22 0812   ? ? Subjective Patient  reports neck pain for years but worsening lately, having trouble sleeping.  She reports history of neck surgery, including cervical fusion C45-6, C6-7 (ACDF) and cervical laminectomy and Chiari decompression.  She reports pain in back of neck and left side of head, with pain even into L head.  also reports some numbness into both arms, with history of radicular symptoms that was improved with cervical fusion, also had L carpal tunnel release.  Reports hands cramp.   ? Pertinent History She underwent double hernia repair by Dr. Rosendo Gros in November of 2021. She has a history of a S1 laminotomy with right microdiskectomy that was performed in September of 2001 by Dr. Sherwood Gambler. She underwent a Chiari decompression by Dr. Annette Stable in January of 2010. She underwent a C5-6, C6-7 ACDF by Dr. Annette Stable in March of 2013. She has also undergone a left L4-5 discectomy in December of 2013 by Dr. Annette Stable.  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 This is been stable over time but over the last 2 months she has noted significant visual change with some change in her visual fields and acuity. She has some associated headaches.   ? Limitations Sitting   ? How long can you sit comfortably? 10 minutes, especially if sitting straight up   ? Diagnostic tests cervical Xray 06/01/2019 C5-6 and C6-7 fusion was noted.  None of the other disc spaces showed   significant narrowing.     Impression: Degenerative disc disease of cervical spine with C5-6 and C6-7   fusion.   ? Patient Stated Goals decrease pain and sleep better   ? Currently in Pain? Yes   ? Pain Score 6    increases when sleeping and sitting too long, up to 9/10  ? Pain Location Neck   ? Pain Orientation Left;Mid   ? Pain Descriptors / Indicators Pressure;Sharp   ? Pain Type Chronic pain   ? Pain Radiating Towards left shoulder and side of face, also has tingling in both hands, cramping   ? Pain Onset Other  (comment)   years but worsened last few months  ? Pain Frequency Constant   ? Aggravating Factors  sitting straight up, trying to sleep   ? Pain Relieving Factors changing positions, towel rolled behind neck   ? Effect of Pain on Daily Activities difficulty with sleep   ? ?  ?  ? ?  ? ? ? ? ? [facility] PT Assessment - 01/23/22 0001   ? ?  ? Assessment  ? Medical Diagnosis M47.812 (ICD-10-CM) - Spondylosis without myelopathy or radiculopathy, cervical region   ? Referring Provider (PT) Pool, Mallie Mussel   ?  Onset Date/Surgical Date --   chronic worsening last few months  ? Hand Dominance Right   ? Prior Therapy yes   ?  ? Precautions  ? Precautions None   ?  ? Restrictions  ? Weight Bearing Restrictions No   ?  ? Balance Screen  ? Has the patient fallen in the past 6 months Yes   ? How many times? 1   lost balance or blacked out while dusting, but caught self  ? Has the patient had a decrease in activity level because of a fear of falling?  No   ? Is the patient reluctant to leave their home because of a fear of falling?  No   ?  ? Home Environment  ? Living Environment Private residence   ? Living Arrangements Children   ? Type of Home House   ? Home Access Stairs to enter   ? Entrance Stairs-Number of Steps 3   ? Home Layout One level   ?  ? Prior Function  ? Level of Independence Independent   ? Vocation On disability   ? Leisure hang out with daughter, shopping   ?  ? Cognition  ? Overall Cognitive Status Within Functional Limits for tasks assessed   ?  ? Observation/Other Assessments  ? Observations pt. enters independently without deviation or device.  Sits leaning foward with foward head posture.   ? Focus on Therapeutic Outcomes (FOTO)  neck: 38%, predicted outcome 45% after 12 visits   ?  ? Sensation  ? Light Touch Impaired by gross assessment   decreased sensation L 1st and 2nd fingers.  ?  ? Posture/Postural Control  ? Posture/Postural Control Postural limitations   ? Postural Limitations Rounded Shoulders;Forward  head;Increased thoracic kyphosis   ?  ? ROM / Strength  ? AROM / PROM / Strength AROM;Strength   ?  ? AROM  ? Overall AROM  Deficits   ? Overall AROM Comments tested in sitting, increased pain with all movement

## 2022-01-23 NOTE — Patient Instructions (Signed)
Access Code: [email or web address] ?URL: [email or web address] ?Date: 01/23/2022 ?Prepared by: Glenetta Hew ? ?Exercises ?Seated Shoulder Shrugs - 3 x daily - 7 x weekly - 1 sets - 10 reps ?Seated Shoulder Shrug Circles AROM Backward - 3 x daily - 7 x weekly - 1 sets - 10 reps ?Seated Cervical Retraction - 3 x daily - 7 x weekly - 1 sets - 10 reps - 5sec hold ?Seated Cervical Rotation AROM - 3 x daily - 7 x weekly - 1 sets - 10 reps ? ?

## 2022-01-31 ENCOUNTER — Telehealth: Payer: Self-pay | Admitting: Physical Therapy

## 2022-01-31 ENCOUNTER — Other Ambulatory Visit: Payer: Self-pay

## 2022-01-31 ENCOUNTER — Encounter: Payer: Self-pay | Admitting: Physical Therapy

## 2022-01-31 ENCOUNTER — Ambulatory Visit: Payer: Medicaid Other | Admitting: Physical Therapy

## 2022-01-31 DIAGNOSIS — M6281 Muscle weakness (generalized): Secondary | ICD-10-CM

## 2022-01-31 DIAGNOSIS — M542 Cervicalgia: Secondary | ICD-10-CM

## 2022-01-31 DIAGNOSIS — R293 Abnormal posture: Secondary | ICD-10-CM

## 2022-01-31 NOTE — Therapy (Addendum)
PHYSICAL THERAPY DISCHARGE SUMMARY ? ?Visits from Start of Care: 2 ? ?Current functional level related to goals / functional outcomes: ?See below ?  ?Remaining deficits: ?See below ?  ?Education / Equipment: ?HEP  ?Plan: ?Patient agrees to discharge.  Patient goals were not met. Patient is being discharged per request, does not feel PT is beneficial to her.     ? ?Rennie Natter, PT, DPT 03/05/2022 8:46AM ? ? ?[place] ?[facility] ?[address] ?[place], [place], [address] ?Phone: [phone]   Fax:  [phone] ? ?Physical Therapy Treatment ? ?Patient Details  ?Name: Virginia Shaw ?MRN: [id number] ?Date of Birth: 1966-08-13 ?Referring Provider (PT): Pool, Mallie Mussel ? ? ?Encounter Date: 01/31/2022 ? ? PT End of Session - 01/31/22 0853   ? ? Visit Number 2   ? Number of Visits 10   ? Date for PT Re-Evaluation 04/03/22   ? Authorization Type [employer]HC Medicaid   ? Progress Note Due on Visit 10   ? PT Start Time [id number]   ? PT Stop Time 0930   ? PT Time Calculation (min) 40 min   ? Activity Tolerance Patient limited by pain   ? Behavior During Therapy [facility] for tasks assessed/performed   ? ?  ?  ? ?  ? ? ?Past Medical History:  ?Diagnosis Date  ? Abnormal involuntary movements(781.0)   ? Allergic rhinitis, cause unspecified   ? Anxiety state, unspecified   ? Cervical spondylosis without myelopathy   ? Chronic headaches   ? Disturbance of skin sensation   ? Fatty liver   ? per patient, "not sure but does remember being told"  ? GERD (gastroesophageal reflux disease)   ? History of chicken pox   ? Irritable bowel syndrome   ? Low blood pressure   ? Lumbago   ? Myalgia and myositis, unspecified   ? Osteoporosis   ? Other and unspecified angina pectoris   ? Other malaise and fatigue   ? Peripheral vascular disease ([place])   ? per patient "diagnosed with it about 13-14 years ago and does not currently see someone for it, saw Dr. Vista Lawman in the past"  ? PONV (postoperative  nausea and vomiting)   ? 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 Shortness of breath on exertion   ? per patient, "sometimes depends on distance"  ? Somatization disorder   ? Spasm of muscle   ? Systemic sclerosis ([place])   ? TIA (transient ischemic attack)   ? Unspecified hereditary and idiopathic peripheral neuropathy   ? ? ?Past Surgical History:  ?Procedure Laterality Date  ? ABDOMINAL HYSTERECTOMY  1997  ? arnold chiari malformation  2013  ? BACK SURGERY  2001, 2011,2013  ? Lumbar x 2  ? BALLOON DILATION N/A 12/22/2019  ? Procedure: BALLOON DILATION;  Surgeon: Wilford Corner, MD;  [facility];  Service: Endoscopy;  Laterality: N/A;  ? barium swallow test  10/2019  ? BLADDER SURGERY  2011  ? CARDIAC CATHETERIZATION    ? CARPAL TUNNEL RELEASE Left   ? CATARACT EXTRACTION    ? CERVICAL SPINE SURGERY  2013  ? CHOLECYSTECTOMY    ? COLONOSCOPY  2009  ? "Normal" Dr Sharlett Iles  ? COLONOSCOPY  2002  ? "Normal" Dr Freddrick March  ? COLONOSCOPY  ~2013  ? Dr Ferdinand Lango.  "Chest pain with the prep" = aborted  ? COLONOSCOPY WITH PROPOFOL N/A 03/29/2020  ? Procedure: COLONOSCOPY WITH PROPOFOL;  Surgeon: Wilford Corner,  MD;  [facility];  Service: Endoscopy;  Laterality: N/A;  ? CRANIECTOMY SUBOCCIPITAL W/ CERVICAL LAMINECTOMY / CHIARI  2012  ? Dr Annette Stable, Chiari decompression with suboccipital craniectomy and  ? CYSTOCELE REPAIR  2002  ? Dr Gaetano Net  ? ESOPHAGOGASTRODUODENOSCOPY (EGD) WITH PROPOFOL N/A 12/22/2019  ? Procedure: ESOPHAGOGASTRODUODENOSCOPY (EGD) WITH PROPOFOL;  Surgeon: Wilford Corner, MD;  [facility];  Service: Endoscopy;  Laterality: N/A;  ? INSERTION OF MESH Bilateral 10/04/2020  ? Procedure: INSERTION OF MESH;  Surgeon: Ralene Ok, MD;  Location: [place];  Service: General;  Laterality: Bilateral;  ? [place], 2000  ? PITUITARY EXCISION  2010  ? pituitary cyst removed  ? RECTOCELE REPAIR  2002  ? Dr. Gaetano Net  ? RENAL BIOPSY  08/04/2019  ? tear duct tubes  2005  ? TUBAL  LIGATION    ? XI ROBOTIC ASSISTED INGUINAL HERNIA REPAIR WITH MESH Bilateral 10/04/2020  ? Procedure: XI ROBOTIC ASSISTED BILATERAL INGUINAL HERNIA REPAIR WITH MESH;  Surgeon: Ralene Ok, MD;  Location: [place];  Service: General;  Laterality: Bilateral;  ? ? ?There were no vitals filed for this visit. ? ? Subjective Assessment - 01/31/22 0851   ? ? Subjective Pt. reports that she is having a lot of back pain today, also has neck pain on R side, and on the left side with certain movements in the inside part, like something is sticking in there.   ? Pertinent History She underwent double hernia repair by Dr. Rosendo Gros in November of 2021. She has a history of a S1 laminotomy with right microdiskectomy that was performed in September of 2001 by Dr. Sherwood Gambler. She underwent a Chiari decompression by Dr. Annette Stable in January of 2010. She underwent a C5-6, C6-7 ACDF by Dr. Annette Stable in March of 2013. She has also undergone a left L4-5 discectomy in December of 2013 by Dr. Annette Stable.  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 This is been stable over time but over the last 2 months she has noted significant visual change with some change in her visual fields and acuity. She has some associated headaches.   ? Limitations Sitting   ? How long can you sit comfortably? 10 minutes, especially if sitting straight up   ? Diagnostic tests cervical Xray 06/01/2019 C5-6 and C6-7 fusion was noted.  None of the other disc spaces showed   significant narrowing.     Impression: Degenerative disc disease of cervical spine with C5-6 and C6-7   fusion.   ? Patient Stated Goals decrease pain and sleep better   ? Currently in Pain? Yes   ? Pain Score 8    ? Pain Location Back   ? Pain Orientation Lower   ? Pain Onset Other (comment)   years but worsened last few months  ? Multiple Pain Sites Yes   ? Pain Score 7   ? Pain Location Neck   ? Pain Orientation Left   ? ?  ?   ? ?  ? ? ? ? ? ? ? ? ? ? ? ? ? ? ? ? ? ? ? ? [place] Adult PT Treatment/Exercise - 01/31/22 0001   ? ?  ? Exercises  ? Exercises Neck   ?  ? Neck Exercises: Seated  ? Shoulder Rolls Backwards;5 reps;Limitations   ? Shoulder Rolls Limitations between exercises   ? Other Seated Exercise TMR based exercises focusing on  non-painful side/ROM, including cervical rotation, upper trunk twist, arm raises and leg raises.  Two sets of 10 with retest after sets, pt. not reporting any improvement with movements and poor tolerance overall.   ?  ? Modalities  ? Modalities Cryotherapy   ?  ? Cryotherapy  ? Number Minutes Cryotherapy 20 Minutes   ? Cryotherapy Location Lumbar Spine   ? Type of Cryotherapy Ice pack   during manual therapy to neck  ?  ? Manual Therapy  ? Manual Therapy Soft tissue mobilization;Myofascial release   ? Manual therapy comments to cervical region in sitting to decrease muscle spasm.   ? Soft tissue mobilization gentle STM to cervical paraspinals, UT, and scalenes   ? Myofascial Release TPR to bil UT, myofacial release to R UT   ? ?  ?  ? ?  ? ? ? ? ? ? ? ? ? ? PT Education - 01/31/22 1339   ? ? Education Details Initiated pain neuroscience education, starting with the concept of the nervous system as the bodies alarm system, and the role of nociception to warn the body of danger.   ? Person(s) Educated Patient   ? Methods Explanation   ? ?  ?  ? ?  ? ? ? PT Short Term Goals - 01/23/22 1129   ? ?  ? PT SHORT TERM GOAL #1  ? Title Ind with initial HEP   ? Time 2   ? Period Weeks   ? Status New   ? Target Date 02/06/22   ? ?  ?  ? ?  ? ? ? ? PT Long Term Goals - 01/23/22 1129   ? ?  ? PT LONG TERM GOAL #1  ? Title Pt will be independent with progressed HEP to improve carryover.   ? Time 10   ? Period Weeks   ? Status New   ? Target Date 04/03/22   ?  ? PT LONG TERM GOAL #2  ? Title Pt. will report 50% improvement in sleep disruption/pain due to neck pain.   ? Time 10   ? Period Weeks   ? Status New   ? Target  Date 04/03/22   ?  ? PT LONG TERM GOAL #3  ? Title Pt. will demonstrate improve sitting posture without cues.   ? Baseline foward head posture, sits leaning foward   ? Time 10   ? Period Weeks   ? Status Ne

## 2022-01-31 NOTE — Telephone Encounter (Signed)
Patient called in afternoon following her therapy appt. Reporting increased pain in L side of neck/eye area.  Suggested gentle movement in pain free ROM, alternating heat and cold to decrease muscle spasm, and can try OTC creams like aspercreme or biofreeze if she finds those helpful.  Also can try gentle pressure to trigger points to see if decreases spasm.  ?

## 2022-02-15 ENCOUNTER — Other Ambulatory Visit: Payer: Self-pay | Admitting: Emergency Medicine

## 2022-02-15 DIAGNOSIS — R042 Hemoptysis: Secondary | ICD-10-CM

## 2022-02-16 ENCOUNTER — Encounter: Payer: Self-pay | Admitting: Nurse Practitioner

## 2022-02-16 ENCOUNTER — Ambulatory Visit (INDEPENDENT_AMBULATORY_CARE_PROVIDER_SITE_OTHER): Payer: Medicaid Other | Admitting: Emergency Medicine

## 2022-02-16 ENCOUNTER — Ambulatory Visit (INDEPENDENT_AMBULATORY_CARE_PROVIDER_SITE_OTHER): Payer: Medicaid Other | Admitting: Nurse Practitioner

## 2022-02-16 VITALS — BP 94/64 | HR 72 | Temp 98.2°F | Ht 59.0 in | Wt 143.0 lb

## 2022-02-16 DIAGNOSIS — D72118 Other hypereosinophilic syndrome: Secondary | ICD-10-CM | POA: Diagnosis not present

## 2022-02-16 DIAGNOSIS — R0789 Other chest pain: Secondary | ICD-10-CM

## 2022-02-16 DIAGNOSIS — R042 Hemoptysis: Secondary | ICD-10-CM | POA: Diagnosis not present

## 2022-02-16 LAB — PULMONARY FUNCTION TEST
DL/VA % pred: 89 %
DL/VA: 3.92 ml/min/mmHg/L
DLCO cor % pred: 100 %
DLCO cor: 18.46 ml/min/mmHg
DLCO unc % pred: 101 %
DLCO unc: 18.63 ml/min/mmHg
FEF 25-75 Post: 2.48 L/sec
FEF 25-75 Pre: 1.89 L/sec
FEF2575-%Change-Post: 31 %
FEF2575-%Pred-Post: 103 %
FEF2575-%Pred-Pre: 78 %
FEV1-%Change-Post: 8 %
FEV1-%Pred-Post: 104 %
FEV1-%Pred-Pre: 96 %
FEV1-Post: 2.49 L
FEV1-Pre: 2.3 L
FEV1FVC-%Change-Post: 4 %
FEV1FVC-%Pred-Pre: 95 %
FEV6-%Change-Post: 3 %
FEV6-%Pred-Post: 105 %
FEV6-%Pred-Pre: 102 %
FEV6-Post: 3.12 L
FEV6-Pre: 3.02 L
FEV6FVC-%Pred-Post: 103 %
FEV6FVC-%Pred-Pre: 103 %
FVC-%Change-Post: 3 %
FVC-%Pred-Post: 102 %
FVC-%Pred-Pre: 99 %
FVC-Post: 3.12 L
FVC-Pre: 3.02 L
Post FEV1/FVC ratio: 80 %
Post FEV6/FVC ratio: 100 %
Pre FEV1/FVC ratio: 76 %
Pre FEV6/FVC Ratio: 100 %
RV % pred: 139 %
RV: 2.36 L
TLC % pred: 119 %
TLC: 5.42 L

## 2022-02-16 LAB — NITRIC OXIDE: FeNO level (ppb): 19

## 2022-02-16 MED ORDER — MONTELUKAST SODIUM 10 MG PO TABS: 10.0000 mg | ORAL_TABLET | Freq: Every day | ORAL | 5 refills | Status: DC

## 2022-02-16 MED ORDER — LEVALBUTEROL TARTRATE 45 MCG/ACT IN AERO: 2.0000 | INHALATION_SPRAY | Freq: Four times a day (QID) | RESPIRATORY_TRACT | 2 refills | Status: DC | PRN

## 2022-02-16 NOTE — Progress Notes (Signed)
? ?'@Patient'$  ID: Virginia Shaw, female    DOB: [birth year], 56 y.o.   MRN: [id number] ? ?Chief Complaint  ?Patient presents with  ? Follow-up  ?  Patient is here to go over PFT results.   ? ? ?Referring provider: ?[name], MD ? ?HPI: ?56 year old female, never smoker followed for recurrent hemoptysis.  She is a patient Dr. Matilde Bash and recently seen by Dr. Lamonte Sakai for bronchoscopy consult.  Last seen in office 01/12/2022.  She is followed by rheumatology for chronic inflammatory arthritis. Past medical history significant for HTN, venous insufficiency, dysphagia, IBS, DDD, benign pituitary adenoma, recurrent UTIs, Chiari.  ? ?TEST/EVENTS:  ?05/17/2020 CT chest without contrast: There is a tiny calcified granuloma in the left lower lobe that is stable.  There are no suspicious pulmonary nodules or mass.  Lungs are overall unremarkable.  There are tiny lesions in the left kidney which are stable since 2015, consistent with benign etiology.  There is also no evidence of acute process or chronic process in pelvis to explain hematuria. ? ?01/05/2022: OV with [place] NP for follow-up after patient was seen in ED for episode of hemoptysis.  Prior to that she had not had any significant hemoptysis in over 2 years.  ED work-up was unremarkable with clear x-ray, negative D-dimer and no evidence of ACS.  Reported that her cough had began about 2 weeks earlier and she had 1 episode where she coughed up blood-tinged mucus.  No further episodes since.  Some residual chest tightness and shortness of breath intermittently.  Also has been having some seasonal allergies and postnasal drainage.  Not currently on any blood thinners.  Suspected possibly related to recent upper respiratory infection which is now improving.  Advise checking PFTs upon return.  Questionable component of reactive airways or asthma.  If hemoptysis returns could consider bronchoscopy. ? ?01/12/2022: Consult with Dr. Lamonte Sakai for possible bronchoscopy.   Patient reported that she had had recurrent episodes of hemoptysis.  Based on intermittent symptoms and description, could be blood coming from nasal or sinus region.  However felt bronchoscopy was appropriate for further evaluation.  Patient agreeable to procedure at visit.  Plans for pulmonary function testing for mid April.  Patient later contacted the office and decided that she would no longer like to move forward with bronchoscopy.  Wanted to complete PFTs and go from there. ? ?Pets: Has a dog.  No cats, birds, farm animals ?Occupation: Used to work at a [employer] until 2007.  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  No current ongoing exposures.  Denies any mold, hot tub, Jacuzzi, humidifier ?Smoking history: Never smoker ?Travel history: No significant travel history ?Relevant family history: There is significant family history of lung disease.  Her aunt had a lung transplant.  She is not clear why the transplant was needed.Marland Kitchen ?Several family members have lung cancer, mesothelioma, COPD. ? ?02/16/2022: Today-follow-up ?Patient presents today for follow-up after pulmonary function testing. She was only able to take 2 puffs of Xopenex d/t lightheadedness during testing. Post bronchodilator response was 8%. No midflow volumes to assess. Questionable obstructive pattern to flow volume loops; otherwise, normal PFTs. Today, reports feeling relatively well. Has not had any recent episodes of hemoptysis. Breathing has also been stable. Doesn't notice any wheezing. No recent weight loss, anorexia, fatigue or lower extremity swelling. She does have occasional allergy symptoms but usually only if she goes out in  the early morning or when there's dew on the ground. She takes children's benadryl if she develops symptoms, which works well for her. Feels like daily antihistamine makes her jittery so she prefers not to use them. There was  suspicion some of her hemoptysis was related to blood from nasal passages/sinus region. She has been seen in the past by ENT and underwent sinus surgery. Still hesitant on undergoing bronchoscopy at this point.  ? ?Allergies  ?Allergen Reactions  ? Iodinated Contrast Media Hives and Itching  ?  Throat scratchiness mild ?*Pt needs to receive a 13 hour prednisone prep prior to future CT scans containing Iodinated contrast media* Patient has prednisone allergy as well!! ?Throat scratchiness  ? ?MRI causes severe headache, swelling,N & V,  bad sore throat and chest pain ? ?Also caused kidney swelling and pain  ? Penicillins Anaphylaxis and Rash  ?  Did it involve swelling of the face/tongue/throat, SOB, or low BP? Yes ?Did it involve sudden or severe rash/hives, skin peeling, or any reaction on the inside of your mouth or nose? Yes ?Did you need to seek medical attention at a hospital or doctor's office? Yes ?When did it last happen?    don't remember  ?If all above answers are ?NO?, may proceed with cephalosporin use. ?  ? Prednisone Other (See Comments)  ?  Chest pain and headache. ?Bladder spasms, chest spasms, increase blood pressure ?  ? Propoxyphene Other (See Comments)  ?  Chest pain. ? ?  ? Acetaminophen Other (See Comments)  ?  Cannot take adult strength tylenol, it cause chest pain. Can take 1/4 tsp of Children's Tylenol only without any chest pain.  ? Benzonatate Other (See Comments)  ?  Chest pain.  ? Buprenorphine Hcl Other (See Comments)  ?  Bladder spasms  ? Citric Acid Hives and Swelling  ?  Fever blisters in mouth  ? Clindamycin Other (See Comments)  ?  Chest tightness  ? Codeine Other (See Comments)  ?  Chest pain.  ? Diazepam Other (See Comments)  ?  Chest pain.  ? Diclofenac Other (See Comments)  ?  Rapid heartbeat  ? Esomeprazole Other (See Comments)  ?  Chest pain and rectal bleeding.  ? Morphine And Related Other (See Comments)  ?  Bladder spasms ?  ? Nsaids Other (See Comments)  ?  Bladder  spasms, chest pain ?  ? Pantoprazole Sodium Other (See Comments)  ?  Chest spasms ?  ? Ceftin [Cefuroxime Axetil]   ? Cephalexin   ? Cortisone Other (See Comments)  ?  unknown reaction  ? Diltiazem   ? Doxycycline   ? Hydrocodone-Acetaminophen   ?  Chest pain.  ? Ibuprofen   ? Lidocaine Other (See Comments)  ?  No reaction noted  ? Loracarbef Other (See Comments)  ?  All over  ? Macrobid [employer] Macro] Other (See Comments)  ?  Numbness in mouth  ? Maxalt [Rizatriptan]   ? Methylprednisolone Sodium Succ Other (See Comments)  ?  unknown reaction  ? Naproxen   ?  Chest pain  ? Omeprazole   ? Oxycodone-Acetaminophen   ?  Chest pain. (incontinence)  ? Phenazopyridine Hcl Other (See Comments)  ?  Unknown reaction  ? Tomato Hives  ? Budesonide Other (See Comments)  ?  fatigued  ? ? ?Immunization History  ?Administered Date(s) Administered  ? Tdap 10/08/2018  ? ? ?Past Medical History:  ?Diagnosis Date  ? Abnormal involuntary movements(781.0)   ?  Allergic rhinitis, cause unspecified   ? Anxiety state, unspecified   ? Cervical spondylosis without myelopathy   ? Chronic headaches   ? Disturbance of skin sensation   ? Fatty liver   ? per patient, "not sure but does remember being told"  ? GERD (gastroesophageal reflux disease)   ? History of chicken pox   ? Irritable bowel syndrome   ? Low blood pressure   ? Lumbago   ? Myalgia and myositis, unspecified   ? Osteoporosis   ? Other and unspecified angina pectoris   ? Other malaise and fatigue   ? Peripheral vascular disease ([place])   ? per patient "diagnosed with it about 13-14 years ago and does not currently see someone for it, saw Dr. Vista Lawman in the past"  ? PONV (postoperative nausea and vomiting)   ? 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 Shortness of breath on exertion   ? per patient, "sometimes depends on distance"  ? Somatization disorder   ? Spasm of muscle   ? Systemic sclerosis ([place])   ? TIA (transient ischemic attack)   ? Unspecified hereditary and  idiopathic peripheral neuropathy   ? ? ?Tobacco History: ?Social History  ? ?Tobacco Use  ?Smoking Status Never  ?Smokeless Tobacco Never  ? ?Counseling given: Not Answered ? ? ?Outpatient Medications Prior to Visit  ?M

## 2022-02-16 NOTE — Progress Notes (Signed)
Full PFT completed today ? ?

## 2022-02-16 NOTE — Assessment & Plan Note (Addendum)
Stable with no recent episodes. Etiology unknown at this point. Possibly related to blood from nasal passage/sinus. Question for possible asthmatic/reactive airway component given chest tightness and intermittent allergy symptoms although PFTs today without significant BD response (8%) and FeNO normal at visit. She does have an elevated eosinophil count (0.5). Will trial addition of singulair 10 mg At bedtime. May need to refer back to ENT for re-evaluation. Advised her to discuss bronchoscopy with her family and notify us if she would like to move forward given no significant findings today.  ? ?Patient Instructions  ?Continue levalbuterol 2 puffs every 6 hours as needed for shortness of breath or wheezing  ?Continue benadryl over the counter as needed for allergies ?Continue delsym 2 tsp Twice daily as needed for cough  ? ?Start singulair 10 mg At bedtime  ? ?If you would like to move forward with bronchoscopy with Dr. Lamonte Sakai, please contact our office to discuss next steps. ? ?Follow up in 4-6 weeks with Dr. Vaughan Browner. If symptoms do not improve or worsen, please contact office for sooner follow up or seek emergency care. ? ? ?

## 2022-02-16 NOTE — Addendum Note (Signed)
Addended by: Fran Lowes on: 02/16/2022 11:28 AM ? ? Modules accepted: Orders ? ?

## 2022-02-16 NOTE — Assessment & Plan Note (Addendum)
Intermittent with DOE. Has tried pulmicort in the past, which has made her dizzy. Rarely uses albuterol as it makes her heart race. Will trial Xopenex inhaler 1-2 puffs as needed. Advised her to notify if she feels any improvement with this.  ?

## 2022-02-16 NOTE — Patient Instructions (Addendum)
Continue levalbuterol 2 puffs every 6 hours as needed for shortness of breath or wheezing  ?Continue benadryl over the counter as needed for allergies ?Continue delsym 2 tsp Twice daily as needed for cough  ? ?Start singulair 10 mg At bedtime  ? ?If you would like to move forward with bronchoscopy with Dr. Lamonte Sakai, please contact our office to discuss next steps. ? ?Follow up in 4-6 weeks with Dr. Vaughan Browner. If symptoms do not improve or worsen, please contact office for sooner follow up or seek emergency care. ?

## 2022-02-19 ENCOUNTER — Telehealth: Payer: Self-pay | Admitting: Nurse Practitioner

## 2022-02-19 ENCOUNTER — Ambulatory Visit: Payer: Medicaid Other | Admitting: Physical Therapy

## 2022-02-19 NOTE — Telephone Encounter (Signed)
Patient is calling because she recently has a PFT done, during the test she got really dizzy and was not able to get the last of the 2 puffs done. She felt dizzy the remainder of the day and eventually had to take her nitro. She did not get her RX for her singulair or her Levalbuterol.  Today she feeling a little better. She's not as dizzy and her chest doesn't feel as tight. Wants to what [facility] thinks she do.  ?

## 2022-02-19 NOTE — Telephone Encounter (Signed)
Called and spoke with pt letting her know info per [facility] and she verbalized understanding. Nothing further needed. ?

## 2022-02-19 NOTE — Telephone Encounter (Signed)
If she had improvement in her symptoms with nitroglycerin, I would advise she contact her cardiologist for visit ASAP for further workup. If still having chest tightness/dizziness, further eval in ED. Concerned unrelated to PFTs/levalbuterol given persistent symptoms and improvement with nitroglycerin. Thanks.

## 2022-02-19 NOTE — Telephone Encounter (Signed)
Called and spoke with pt who stated during the PFT 4/14, she got really dizzy during the test and was not able to get the last two puffs done. Stated the remainder of the day 4/14, she was still feeling very dizzy and also stated that she had a lot of tightness in her chest. Pt said she ended up taking one of her nitroglycerins 4/14 due to her symptoms. Said after taking it, she started feeling better. Said the tightness in her chest went away and said that she still had some soreness. ? ?Pt said that she is still feeling a little dizzy but it is a lot better compared to how she was 4/14.  Due to all this, pt wants to know what Joellen Jersey thinks she might need to do. ? ? ?Joellen Jersey, please advise. ?

## 2022-02-20 NOTE — Progress Notes (Signed)
? ?Office Visit Note ? ?Patient: Virginia Shaw             ?Date of Birth: 1966/08/04           ?MRN: [id number]             ?PCP: [name], MD ?Referring: [name], MD ?Visit Date: 03/06/2022 ?Occupation: '@GUAROCC'$ @ ? ?Subjective:  ?Increased pain in joints ? ?History of Present Illness: Virginia Shaw is a 56 y.o. female with history of chronic inflammatory arthritis, osteoarthritis, myofascial pain and chronic pain syndrome.  She states she has been experiencing increased pain and discomfort in her bilateral hands.  She is also noticing that some of her fingers trigger.  She has been experiencing increased pain in her neck and lower back.  She notices some swelling in her hands and ankles.  She denies any generalized pain. ? ?Activities of Daily Living:  ?Patient reports morning stiffness for 5-10  minutes.   ?Patient Reports nocturnal pain.  ?Difficulty dressing/grooming: Denies ?Difficulty climbing stairs: Reports ?Difficulty getting out of chair: Denies ?Difficulty using hands for taps, buttons, cutlery, and/or writing: Reports ? ?Review of Systems  ?Constitutional:  Negative for fatigue.  ?HENT:  Negative for mouth sores, mouth dryness and nose dryness.   ?Eyes:  Positive for visual disturbance and dryness. Negative for itching.  ?Respiratory:  Positive for shortness of breath. Negative for difficulty breathing.   ?Cardiovascular:  Positive for chest pain. Negative for palpitations.  ?Gastrointestinal:  Negative for blood in stool, constipation and diarrhea.  ?Endocrine: Negative for increased urination.  ?Genitourinary:  Negative for difficulty urinating.  ?Musculoskeletal:  Positive for joint pain, joint pain, joint swelling, myalgias, morning stiffness, muscle tenderness and myalgias.  ?Skin:  Negative for color change, rash, redness and sensitivity to sunlight.  ?Allergic/Immunologic: Negative for susceptible to infections.  ?Neurological:  Positive for dizziness, numbness, headaches  and weakness. Negative for memory loss.  ?Hematological:  Negative for bruising/bleeding tendency.  ?Psychiatric/Behavioral:  Positive for sleep disturbance. Negative for depressed mood and confusion. The patient is not nervous/anxious.   ? ?PMFS History:  ?Patient Active Problem List  ? Diagnosis Date Noted  ? Hemoptysis 01/05/2022  ? Special screening for malignant neoplasms, colon 03/29/2020  ? Dysphagia 12/22/2019  ? DDD (degenerative disc disease), cervical 08/11/2018  ? DDD (degenerative disc disease), lumbar 08/11/2018  ? Somatic complaints, multiple 03/14/2017  ? Chronic pain syndrome 08/25/2014  ? Irritable bowel syndrome (IBS) 01/12/2014  ? 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 Chronic abdominal pain 01/11/2014  ? Chronic venous insufficiency 10/30/2013  ? Varicose veins 10/30/2013  ? Chest tightness 03/25/2012  ? PITUITARY ADENOMA, 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  ? RECTAL BLEEDING 08/26/2008  ? UTI'S, RECURRENT 08/26/2008  ?  ?Past Medical History:  ?Diagnosis Date  ? Abnormal involuntary movements(781.0)   ? Allergic rhinitis, cause unspecified   ? Anxiety state, unspecified   ? Cervical spondylosis without myelopathy   ? Chronic headaches   ? Disturbance of skin sensation   ? Fatty liver   ? per patient, "not sure but does remember being told"  ? GERD (gastroesophageal reflux disease)   ? History of chicken pox   ? Irritable bowel syndrome   ? Low blood pressure   ? Lumbago   ? Myalgia and myositis, unspecified   ? Osteoporosis   ? Other and unspecified angina pectoris   ? Other malaise and fatigue   ? Peripheral vascular disease ([place])   ? per patient "diagnosed with it about 13-14  years ago and does not currently see someone for it, saw Dr. Vista Lawman in the past"  ? PONV (postoperative nausea and vomiting)   ? 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 Shortness of breath on exertion   ? per patient, "sometimes depends on distance"  ? Somatization disorder   ? Spasm of muscle   ? Systemic sclerosis ([place])   ? TIA  (transient ischemic attack)   ? Unspecified hereditary and idiopathic peripheral neuropathy   ?  ?Family History  ?Problem Relation Age of Onset  ? Hypertension Father   ? Hyperlipidemia Father   ? Diabetes Father   ? Stroke Father   ? Cancer Maternal Uncle   ?     lung  ? Cancer Paternal Aunt   ?     lung  ? Migraines Mother   ? Heart attack Paternal Grandmother   ? Heart attack Paternal Grandfather   ? Hypertension Paternal Grandfather   ? Migraines Sister   ? ?Past Surgical History:  ?Procedure Laterality Date  ? ABDOMINAL HYSTERECTOMY  1997  ? arnold chiari malformation  2013  ? BACK SURGERY  2001, 2011,2013  ? Lumbar x 2  ? BALLOON DILATION N/A 12/22/2019  ? Procedure: BALLOON DILATION;  Surgeon: Wilford Corner, MD;  Location: [facility];  Service: Endoscopy;  Laterality: N/A;  ? barium swallow test  10/2019  ? BLADDER SURGERY  2011  ? CARDIAC CATHETERIZATION    ? CARPAL TUNNEL RELEASE Left   ? CATARACT EXTRACTION    ? CERVICAL SPINE SURGERY  2013  ? CHOLECYSTECTOMY    ? COLONOSCOPY  2009  ? "Normal" Dr Sharlett Iles  ? COLONOSCOPY  2002  ? "Normal" Dr Freddrick March  ? COLONOSCOPY  ~2013  ? Dr Ferdinand Lango.  "Chest pain with the prep" = aborted  ? COLONOSCOPY WITH PROPOFOL N/A 03/29/2020  ? Procedure: COLONOSCOPY WITH PROPOFOL;  Surgeon: Wilford Corner, MD;  Location: [facility];  Service: Endoscopy;  Laterality: N/A;  ? CRANIECTOMY SUBOCCIPITAL W/ CERVICAL LAMINECTOMY / CHIARI  2012  ? Dr Annette Stable, Chiari decompression with suboccipital craniectomy and  ? CYSTOCELE REPAIR  2002  ? Dr Gaetano Net  ? ESOPHAGOGASTRODUODENOSCOPY (EGD) WITH PROPOFOL N/A 12/22/2019  ? Procedure: ESOPHAGOGASTRODUODENOSCOPY (EGD) WITH PROPOFOL;  Surgeon: Wilford Corner, MD;  Location: [facility];  Service: Endoscopy;  Laterality: N/A;  ? INSERTION OF MESH Bilateral 10/04/2020  ? Procedure: INSERTION OF MESH;  Surgeon: Ralene Ok, MD;  Location: [place];  Service: General;  Laterality: Bilateral;  ? [place], 2000  ? PITUITARY  EXCISION  2010  ? pituitary cyst removed  ? RECTOCELE REPAIR  2002  ? Dr. Gaetano Net  ? RENAL BIOPSY  08/04/2019  ? tear duct tubes  2005  ? TUBAL LIGATION    ? XI ROBOTIC ASSISTED INGUINAL HERNIA REPAIR WITH MESH Bilateral 10/04/2020  ? Procedure: XI ROBOTIC ASSISTED BILATERAL INGUINAL HERNIA REPAIR WITH MESH;  Surgeon: Ralene Ok, MD;  Location: [place];  Service: General;  Laterality: Bilateral;  ? ?Social History  ? ?Social History Narrative  ? Lives at home with children. ( 2 daughters)  ? Regular exercise-yes  ? Caffeine Use-no  ? GED  ? Not Working outside home  ? ?Immunization History  ?Administered Date(s) Administered  ? Tdap 10/08/2018  ?  ? ?Objective: ?Vital Signs: BP 108/73 (BP Location: Left Arm, Patient Position: Sitting, Cuff Size: Normal)   Pulse 71   Ht 4' 11.75" (1.518 m)   Wt 143 lb 9.6 oz (65.1 kg)  BMI 28.28 kg/m?   ? ?Physical Exam ?Vitals and nursing note reviewed.  ?Constitutional:   ?   Appearance: She is well-developed.  ?HENT:  ?   Head: Normocephalic and atraumatic.  ?Eyes:  ?   Conjunctiva/sclera: Conjunctivae normal.  ?Cardiovascular:  ?   Rate and Rhythm: Normal rate and regular rhythm.  ?   Heart sounds: Normal heart sounds.  ?Pulmonary:  ?   Effort: Pulmonary effort is normal.  ?   Breath sounds: Normal breath sounds.  ?Abdominal:  ?   General: Bowel sounds are normal.  ?   Palpations: Abdomen is soft.  ?Musculoskeletal:  ?   Cervical back: Normal range of motion.  ?Lymphadenopathy:  ?   Cervical: No cervical adenopathy.  ?Skin: ?   General: Skin is warm and dry.  ?   Capillary Refill: Capillary refill takes less than 2 seconds.  ?Neurological:  ?   Mental Status: She is alert and oriented to person, place, and time.  ?Psychiatric:     ?   Behavior: Behavior normal.  ?  ? ?Musculoskeletal Exam: She had limited painful range of motion of the cervical and lumbar spine.  Shoulder joints, elbow joints, wrist joints with good range of motion.  She had bilateral PIP and DIP  thickening.  She had left middle trigger finger.  Hip joints and knee joints in good range of motion.  There was no tenderness over ankles or MTPs.  No synovitis was noted on the examination. ? ?CDAI Exam: ?CDAI [place]

## 2022-02-21 ENCOUNTER — Telehealth: Payer: Self-pay | Admitting: Nurse Practitioner

## 2022-02-21 NOTE — Telephone Encounter (Signed)
Did the nitroglycerin relieve her symptoms? If so, sounds like it could be angina/cardiac in nature and I would advise she seek further care in ED. She should not still have residual symptoms from the levalbuterol this many days later. Thanks.

## 2022-02-21 NOTE — Telephone Encounter (Signed)
Called and spoke with patient to let her know the recs from [place] to go to the ED for further eval. She states that the nitro did relieve/help her symptoms. Patient states that she is waiting on a call back from her cardiologist and will go from there. She has requested that Levalbuterol be added to her allergy list because she feels like that is what started everything. Nothing further needed at this time.  ?

## 2022-02-21 NOTE — Telephone Encounter (Addendum)
Called and spoke with patient who states that she is still having chest tightness and cough after PFT. Patient states she took nitro Friday after testing and also last night.  ? ?Please advise ?

## 2022-02-27 ENCOUNTER — Ambulatory Visit: Payer: Medicaid Other | Admitting: Pulmonary Disease

## 2022-02-28 ENCOUNTER — Encounter: Payer: Medicaid Other | Admitting: Physical Therapy

## 2022-03-06 ENCOUNTER — Ambulatory Visit: Payer: Medicaid Other | Admitting: Rheumatology

## 2022-03-06 ENCOUNTER — Encounter: Payer: Self-pay | Admitting: Rheumatology

## 2022-03-06 VITALS — BP 108/73 | HR 71 | Ht 59.75 in | Wt 143.6 lb

## 2022-03-06 DIAGNOSIS — M65332 Trigger finger, left middle finger: Secondary | ICD-10-CM

## 2022-03-06 DIAGNOSIS — G5603 Carpal tunnel syndrome, bilateral upper limbs: Secondary | ICD-10-CM | POA: Diagnosis not present

## 2022-03-06 DIAGNOSIS — M19041 Primary osteoarthritis, right hand: Secondary | ICD-10-CM

## 2022-03-06 DIAGNOSIS — I872 Venous insufficiency (chronic) (peripheral): Secondary | ICD-10-CM

## 2022-03-06 DIAGNOSIS — M5030 Other cervical disc degeneration, unspecified cervical region: Secondary | ICD-10-CM

## 2022-03-06 DIAGNOSIS — G894 Chronic pain syndrome: Secondary | ICD-10-CM

## 2022-03-06 DIAGNOSIS — Z79899 Other long term (current) drug therapy: Secondary | ICD-10-CM

## 2022-03-06 DIAGNOSIS — Z8742 Personal history of other diseases of the female genital tract: Secondary | ICD-10-CM

## 2022-03-06 DIAGNOSIS — Z8719 Personal history of other diseases of the digestive system: Secondary | ICD-10-CM

## 2022-03-06 DIAGNOSIS — M5136 Other intervertebral disc degeneration, lumbar region: Secondary | ICD-10-CM

## 2022-03-06 DIAGNOSIS — I201 Angina pectoris with documented spasm: Secondary | ICD-10-CM

## 2022-03-06 DIAGNOSIS — M7918 Myalgia, other site: Secondary | ICD-10-CM

## 2022-03-06 DIAGNOSIS — M5416 Radiculopathy, lumbar region: Secondary | ICD-10-CM

## 2022-03-06 DIAGNOSIS — M1990 Unspecified osteoarthritis, unspecified site: Secondary | ICD-10-CM | POA: Diagnosis not present

## 2022-03-06 DIAGNOSIS — M19042 Primary osteoarthritis, left hand: Secondary | ICD-10-CM

## 2022-03-06 DIAGNOSIS — Z8679 Personal history of other diseases of the circulatory system: Secondary | ICD-10-CM

## 2022-03-06 DIAGNOSIS — D352 Benign neoplasm of pituitary gland: Secondary | ICD-10-CM

## 2022-03-07 ENCOUNTER — Encounter: Payer: Medicaid Other | Admitting: Physical Therapy

## 2022-03-14 ENCOUNTER — Encounter: Payer: Medicaid Other | Admitting: Physical Therapy

## 2022-03-21 ENCOUNTER — Encounter: Payer: Medicaid Other | Admitting: Physical Therapy

## 2022-03-26 ENCOUNTER — Ambulatory Visit: Payer: Medicaid Other | Admitting: Pulmonary Disease

## 2022-03-29 ENCOUNTER — Encounter: Payer: Self-pay | Admitting: Pulmonary Disease

## 2022-03-29 ENCOUNTER — Ambulatory Visit: Payer: Medicaid Other | Admitting: Pulmonary Disease

## 2022-03-29 VITALS — BP 102/62 | HR 76 | Temp 98.4°F | Ht 60.0 in | Wt 144.2 lb

## 2022-03-29 DIAGNOSIS — R042 Hemoptysis: Secondary | ICD-10-CM

## 2022-03-29 DIAGNOSIS — R0602 Shortness of breath: Secondary | ICD-10-CM

## 2022-03-29 NOTE — Progress Notes (Signed)
Virginia Shaw    [id number]    August 15, 1966  Primary Care Physician:Smith, Hal Hope, MD  Referring Physician: [name], [place],  [place] [address]  Chief complaint: Follow up for blood in sputum  HPI: 56 year old with history of angina, osteoarthritis, endometriosis, IBS, chronic neck and back pain sent here for evaluation of hemoptysis.  She has had intermittent streaks of blood in the sputum for the past year.  She had an episode of blood with clots in the sputum around May of this year.  Also noted to have proteinuria and hematuria.  Follows with urology at [facility].  She has been evaluated by Dr. Harrie Jeans, nephrology.  Work-up as noted below with elevated anti-proteinase 3 antibody.    She has not had any more episodes of blood in sputum since May.  Blood is not associated with cough.  Denies any dyspnea, sputum production, fevers, chills.  History noted for sinusitis.  She has had nasal surgery in the past.  Also has intermittent episodes of GERD.  She has chronic joint pain which is thought to be secondary to osteoarthritis and degenerative disc disease.  Follows with Dr. Estanislado Pandy.  She does not take NSAIDs due to side effects.  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  She is not on any particular treatment from her nephrologist and is being monitored.  Pets: Has a dog.  No cats, birds, farm animals Occupation: Used to work at a [employer] until 2007.  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  No current ongoing exposures.  Denies any mold, hot tub, Jacuzzi, humidifier Smoking history: Never smoker Travel history: No significant travel history Relevant family history: There is significant family history of lung disease.  Her aunt had a lung transplant.  She is not clear why the transplant was  needed.. Several family members have lung cancer, mesothelioma, COPD.  Interim history: Last seen by me in 2021 when a bronchoscopy is recommended but she did not want to go ahead with the procedure She had a recurrence of hemoptysis in early 2023 and saw Dr. Lamonte Sakai. Bronchoscopy was discussed again but did not go through that  She is ready to reconsider the procedure now. She has not had any recurrence of hemoptysis for the past 22 months.  Outpatient Encounter Medications as of 03/29/2022  Medication Sig   albuterol (VENTOLIN HFA) 108 (90 Base) MCG/ACT inhaler as needed.   amLODipine (NORVASC) 5 MG tablet Take 5 mg by mouth daily.    CINNAMON PO Take by mouth. Adds to coffee daily   Ginger, Zingiber officinalis, (GINGER PO) Take by mouth. Adds to tea daily   isosorbide mononitrate (IMDUR) 30 MG 24 hr tablet Take 30 mg by mouth 2 (two) times daily.   metoprolol succinate (TOPROL-XL) 25 MG 24 hr tablet Take 12.5 mg by mouth daily.   nitroGLYCERIN (NITROSTAT) 0.4 MG SL tablet Place 0.4 mg under the tongue every 5 (five) minutes x 3 doses as needed for chest pain.    Polyethyl Glycol-Propyl Glycol (SYSTANE) 0.4-0.3 % SOLN Place 1 drop into both eyes in the morning and at bedtime.   No facility-administered encounter medications on file as of 03/29/2022.   Physical Exam: Blood pressure 102/62, pulse 76, temperature 98.4 F (36.9 C), temperature source Oral, height 5' (1.524 m),  weight 144 lb 3.2 oz (65.4 kg), SpO2 97 %. Gen:      No acute distress HEENT:  EOMI, sclera anicteric Neck:     No masses; no thyromegaly Lungs:    Clear to auscultation bilaterally; normal respiratory effort CV:         Regular rate and rhythm; no murmurs Abd:      + bowel sounds; soft, non-tender; no palpable masses, no distension Ext:    No edema; adequate peripheral perfusion Skin:      Warm and dry; no rash Neuro: alert and oriented x 3 Psych: normal mood and affect   Data Reviewed: Imaging: Chest x-ray  07/24/2018- no active cardiopulmonary disease. CT abdomen pelvis 03/01/2019- no acute abnormality in lower chest. High-resolution CT 07/08/2019- no interstitial lung disease.  Minimal air trapping.  Aortic atherosclerosis.   CT chest 05/17/2020-no acute lung findings.  Tiny lesion in left kidney. I have reviewed the images personally.  Labs: From nephrology office 05/05/2019- Anti-proteinase 3 antibody-8.8 [0-3.5) MPO ab, c-ANCA, p-ANCA-negative Anti-GBM antibody-6 units C3-C4 complement-within normal limits ANA- negative  Metabolic panel 9/47/6546- BUN 17, creatinine 0.85 CBC--WBC 7.9, hemoglobin 14.3, platelets 379, eos 15%, absolute eosinophil count 1185 UA 05/05/2019-1+ occult blood, negative protein  Pathology: Kidney biopsy 08/04/2019-glomerulopathy with segmental GBM thinning.  Assessment:  Evaluation for blood in sputum Unclear if this is actually hemoptysis.  She has been worked up for hematuria with anti-PR-3 antibody. Kidney biopsy with no evidence of vasculitis.  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 There is also question of GERD with esophageal stricture as she is seeing a gastroenterologist for dilatation.  She is ready to proceed with bronchoscope. We'll get CT chest for evaluation as she has not had imaging study for several years Review CT in the next two to four weeks and plan for next steps  Sinus congestion, allergies Singulair was recommended at last clinic visit but she wants to avoid due to side effects I advised her to take over-the-counter Zyrtec or Claritin  Plan/Recommendations: -CT chest  Marshell Garfinkel MD [facility] 03/29/2022, 4:19 PM  CC: [name], MD

## 2022-03-29 NOTE — Patient Instructions (Signed)
Will get a high-resolution CT next available date for better evaluation of your lungs follow-up in clinic in 2 to 4 weeks to review scan results and discuss possible bronchoscopy

## 2022-04-09 ENCOUNTER — Telehealth: Payer: Self-pay | Admitting: Pulmonary Disease

## 2022-04-10 NOTE — Telephone Encounter (Signed)
I called the patient and she has a CT scan on 04/12/22. She is aware of where to go. She did not have any other questions. Nothing further needed.

## 2022-04-12 ENCOUNTER — Ambulatory Visit (HOSPITAL_BASED_OUTPATIENT_CLINIC_OR_DEPARTMENT_OTHER)
Admission: RE | Admit: 2022-04-12 | Discharge: 2022-04-12 | Disposition: A | Discharge status: Home / Self Care | Payer: Medicaid Other | Source: Ambulatory Visit | Attending: Pulmonary Disease | Admitting: Pulmonary Disease

## 2022-04-12 DIAGNOSIS — R0602 Shortness of breath: Secondary | ICD-10-CM | POA: Insufficient documentation

## 2022-04-25 ENCOUNTER — Encounter: Payer: Self-pay | Admitting: Specialist

## 2022-04-25 ENCOUNTER — Ambulatory Visit (INDEPENDENT_AMBULATORY_CARE_PROVIDER_SITE_OTHER): Payer: Medicaid Other

## 2022-04-25 ENCOUNTER — Ambulatory Visit: Payer: Medicaid Other | Admitting: Specialist

## 2022-04-25 VITALS — BP 106/70 | HR 76 | Ht 59.0 in | Wt 143.5 lb

## 2022-04-25 DIAGNOSIS — M5136 Other intervertebral disc degeneration, lumbar region: Secondary | ICD-10-CM | POA: Diagnosis not present

## 2022-04-25 NOTE — Patient Instructions (Addendum)
Plan: Avoid frequent bending and stooping  No lifting greater than 10 lbs. May use ice or moist heat for pain. Weight loss is of benefit. Best medication for lumbar disc disease is arthritis medications but you can not take theses due to kidney changes and angina.  Exercise is important to improve your indurance and does allow people to function better inspite of back pain. Lumbar MRI with out contrast due to allergy to contrast with hive

## 2022-04-25 NOTE — Progress Notes (Signed)
Office Visit Note   Patient: Virginia Shaw           Date of Birth: 06-Mar-1966           MRN: [id number] Visit Date: 04/25/2022              Requested by: [name], [place],  [place] [address] PCP: [name], MD   Assessment & Plan: Visit Diagnoses:  1. Degenerative disc disease, lumbar     Plan: Avoid frequent bending and stooping  No lifting greater than 10 lbs. May use ice or moist heat for pain. Weight loss is of benefit. Best medication for lumbar disc disease is arthritis medications but you can not take theses due to kidney changes and angina.  Exercise is important to improve your indurance and does allow people to function better inspite of back pain. Lumbar MRI with out contrast due to allergy to contrast with hives  Follow-Up Instructions: No follow-ups on file.   Orders:  Orders Placed This Encounter  Procedures   XR Lumbar Spine 2-3 Views   No orders of the defined types were placed in this encounter.     Procedures: No procedures performed   Clinical Data: No additional findings.   Subjective: Chief Complaint  Patient presents with   Lower Back - Follow-up    56 year old female with back and left hip pain. Sitting at a pinic table with the legs out from the table but moving the legs to get under the table was painful. She has been told she has borderline Interstital. Having more pain in the left rib cage and alot in the pain. Today a "7" and on Sunday it was a 10.  Review of Systems   Objective: Vital Signs: BP 106/70 (BP Location: Left Arm, Patient Position: Sitting)   Pulse 76   Ht '4\' 11"'$  (1.499 m)   Wt 143 lb 8 oz (65.1 kg)   BMI 28.98 kg/m   Physical Exam  Ortho Exam  Specialty Comments:  No specialty comments available.  Imaging: No results found.   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   Chronic abdominal pain 01/11/2014   Chronic venous insufficiency 10/30/2013   Varicose veins 10/30/2013   Chest tightness 03/25/2012   PITUITARY ADENOMA, 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   RECTAL BLEEDING 08/26/2008   UTI'S, RECURRENT 08/26/2008   Past Medical History:  Diagnosis Date   Abnormal involuntary movements(781.0)    Allergic rhinitis, cause unspecified    Anxiety state, unspecified    Cervical spondylosis without myelopathy    Chronic headaches    Disturbance of skin sensation    Fatty liver    per patient, "not sure but does remember being told"   GERD (gastroesophageal reflux disease)    History of chicken pox    Irritable bowel syndrome    Low blood pressure    Lumbago    Myalgia and myositis, unspecified    Osteoporosis    Other and unspecified angina pectoris    Other malaise and fatigue    Peripheral vascular disease ([place])  per patient "diagnosed with it about 13-14 years ago and does not currently see someone for it, saw Dr. 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   Shortness of breath on exertion    per patient, "sometimes depends on distance"   Somatization disorder    Spasm of muscle    Systemic sclerosis ([place])    TIA (transient ischemic attack)    Unspecified hereditary and idiopathic peripheral neuropathy     Family History  Problem Relation Age of Onset   Hypertension Father    Hyperlipidemia Father    Diabetes Father    Stroke Father    Cancer Maternal Uncle        lung   Cancer Paternal Aunt        lung   Migraines Mother    Heart attack Paternal Grandmother    Heart  attack Paternal Grandfather    Hypertension Paternal Grandfather    Migraines Sister     Past Surgical History:  Procedure Laterality Date   ABDOMINAL HYSTERECTOMY  1997   arnold chiari malformation  2013   BACK SURGERY  2001, 2011,2013   Lumbar x 2   BALLOON DILATION N/A 12/22/2019   Procedure: BALLOON DILATION;  Surgeon: Wilford Corner, MD;  Location: [facility];  Service: Endoscopy;  Laterality: N/A;   barium swallow test  10/2019   BLADDER SURGERY  2011   CARDIAC CATHETERIZATION     CARPAL TUNNEL RELEASE Left    CATARACT EXTRACTION     CERVICAL SPINE SURGERY  2013   CHOLECYSTECTOMY     COLONOSCOPY  2009   "Normal" Dr Sharlett Iles   COLONOSCOPY  2002   "Normal" Dr Freddrick March   COLONOSCOPY  ~2013   Dr Ferdinand Lango.  "Chest pain with the prep" = aborted   COLONOSCOPY WITH PROPOFOL N/A 03/29/2020   Procedure: COLONOSCOPY WITH PROPOFOL;  Surgeon: Wilford Corner, MD;  Location: [facility];  Service: Endoscopy;  Laterality: N/A;   CRANIECTOMY SUBOCCIPITAL W/ CERVICAL LAMINECTOMY / CHIARI  2012   Dr Annette Stable, Chiari decompression with suboccipital craniectomy and   CYSTOCELE REPAIR  2002   Dr Gaetano Net   ESOPHAGOGASTRODUODENOSCOPY (EGD) WITH PROPOFOL N/A 12/22/2019   Procedure: ESOPHAGOGASTRODUODENOSCOPY (EGD) WITH PROPOFOL;  Surgeon: Wilford Corner, MD;  Location: [facility];  Service: Endoscopy;  Laterality: N/A;   INSERTION OF MESH Bilateral 10/04/2020   Procedure: INSERTION OF MESH;  Surgeon: Ralene Ok, MD;  Location: [facility];  Service: General;  Laterality: Bilateral;   OOPHORECTOMY  1999, 2000   PITUITARY EXCISION  2010   pituitary cyst removed   RECTOCELE REPAIR  2002   Dr. Gaetano Net   RENAL BIOPSY  08/04/2019   tear duct tubes  2005   TUBAL LIGATION     XI ROBOTIC ASSISTED INGUINAL HERNIA REPAIR WITH MESH Bilateral 10/04/2020   Procedure: XI ROBOTIC ASSISTED BILATERAL INGUINAL HERNIA REPAIR WITH MESH;  Surgeon: Ralene Ok, MD;  Location: [facility];  Service:  General;  Laterality: Bilateral;   Social History   Occupational History    Employer: UNEMPLOYED  Tobacco Use   Smoking status: Never    Passive exposure: Past   Smokeless tobacco: Never  Vaping Use   Vaping Use: Never used  Substance and Sexual Activity   Alcohol use: No   Drug use: No   Sexual activity: Never    Birth control/protection: Abstinence

## 2022-04-30 ENCOUNTER — Ambulatory Visit: Payer: Medicaid Other | Admitting: Pulmonary Disease

## 2022-05-01 ENCOUNTER — Ambulatory Visit
Admission: RE | Admit: 2022-05-01 | Discharge: 2022-05-01 | Disposition: A | Discharge status: Home / Self Care | Payer: Medicaid Other | Source: Ambulatory Visit | Attending: Specialist | Admitting: Specialist

## 2022-05-01 ENCOUNTER — Encounter: Payer: Self-pay | Admitting: Pulmonary Disease

## 2022-05-01 ENCOUNTER — Ambulatory Visit (INDEPENDENT_AMBULATORY_CARE_PROVIDER_SITE_OTHER): Payer: Medicaid Other | Admitting: Pulmonary Disease

## 2022-05-01 VITALS — BP 110/70 | HR 82 | Temp 98.3°F | Ht 60.0 in | Wt 142.0 lb

## 2022-05-01 DIAGNOSIS — R042 Hemoptysis: Secondary | ICD-10-CM

## 2022-05-01 DIAGNOSIS — M5136 Other intervertebral disc degeneration, lumbar region: Secondary | ICD-10-CM

## 2022-05-01 NOTE — Progress Notes (Signed)
Virginia Shaw    [id number]    05/15/1966  Primary Care Physician:Smith, Hal Hope, MD  Referring Physician: [name], [employer] [address] [place],  [place] [address]  Chief complaint: Follow up for blood in sputum  HPI: 56 year old with history of angina, osteoarthritis, endometriosis, IBS, chronic neck and back pain sent here for evaluation of hemoptysis.  She has had intermittent streaks of blood in the sputum for the past year.  She had an episode of blood with clots in the sputum around May of this year.  Also noted to have proteinuria and hematuria.  Follows with urology at [facility].  She has been evaluated by Dr. Harrie Jeans, nephrology.  Work-up as noted below with elevated anti-proteinase 3 antibody.    She has not had any more episodes of blood in sputum since May.  Blood is not associated with cough.  Denies any dyspnea, sputum production, fevers, chills.  History noted for sinusitis.  She has had nasal surgery in the past.  Also has intermittent episodes of GERD.  She has chronic joint pain which is thought to be secondary to osteoarthritis and degenerative disc disease.  Follows with Dr. Estanislado Pandy.  She does not take NSAIDs due to side effects.  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  She is not on any particular treatment from her nephrologist and is being monitored.  Pets: Has a dog.  No cats, birds, farm animals Occupation: Used to work at a [employer] until 2007.  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  No current ongoing exposures.  Denies any mold, hot tub, Jacuzzi, humidifier Smoking history: Never smoker Travel history: No significant travel history Relevant family history: There is significant family history of lung disease.  Her aunt had a lung transplant.  She is not clear why the transplant was  needed.. Several family members have lung cancer, mesothelioma, COPD.  Interim history: Last seen by me in 2021 when a bronchoscopy is recommended but she did not want to go ahead with the procedure She had a recurrence of hemoptysis in early 2023 and saw Dr. Lamonte Sakai. Bronchoscopy was discussed again but did not go through that  She is ready to reconsider the procedure now.   Outpatient Encounter Medications as of 05/01/2022  Medication Sig   albuterol (VENTOLIN HFA) 108 (90 Base) MCG/ACT inhaler as needed.   amLODipine (NORVASC) 5 MG tablet Take 5 mg by mouth daily.    CINNAMON PO Take by mouth. Adds to coffee daily   Ginger, Zingiber officinalis, (GINGER PO) Take by mouth. Adds to tea daily   isosorbide mononitrate (IMDUR) 30 MG 24 hr tablet Take 30 mg by mouth 2 (two) times daily.   metoprolol succinate (TOPROL-XL) 25 MG 24 hr tablet Take 12.5 mg by mouth daily.   nitroGLYCERIN (NITROSTAT) 0.4 MG SL tablet Place 0.4 mg under the tongue every 5 (five) minutes x 3 doses as needed for chest pain.    Polyethyl Glycol-Propyl Glycol (SYSTANE) 0.4-0.3 % SOLN Place 1 drop into both eyes in the morning and at bedtime.   No facility-administered encounter medications on file as of 05/01/2022.   Physical Exam: Blood pressure 110/70, pulse 82, temperature 98.3 F (36.8 C), temperature source Oral, height 5' (1.524 m), weight 142 lb (64.4 kg), SpO2 96 %. Gen:  No acute distress HEENT:  EOMI, sclera anicteric Neck:     No masses; no thyromegaly Lungs:    Clear to auscultation bilaterally; normal respiratory effort CV:         Regular rate and rhythm; no murmurs Abd:      + bowel sounds; soft, non-tender; no palpable masses, no distension Ext:    No edema; adequate peripheral perfusion Skin:      Warm and dry; no rash Neuro: alert and oriented x 3 Psych: normal mood and affect   Data Reviewed: Imaging: Chest x-ray 07/24/2018- no active cardiopulmonary disease. CT abdomen pelvis 03/01/2019-  no acute abnormality in lower chest. High-resolution CT 07/08/2019- no interstitial lung disease.  Minimal air trapping.  Aortic atherosclerosis.   CT chest 05/17/2020-no acute lung findings.  Tiny lesion in left kidney. High-resolution CT 04/12/2022-mild bland scarring at the bases.  No interstitial lung disease. I have reviewed the images personally.  Labs: From nephrology office 05/05/2019- Anti-proteinase 3 antibody-8.8 [0-3.5) MPO ab, c-ANCA, p-ANCA-negative Anti-GBM antibody-6 units C3-C4 complement-within normal limits ANA- negative  Metabolic panel 2/97/9892- BUN 17, creatinine 0.85 CBC--WBC 7.9, hemoglobin 14.3, platelets 379, eos 15%, absolute eosinophil count 1185 UA 05/05/2019-1+ occult blood, negative protein  Pathology: Kidney biopsy 08/04/2019-glomerulopathy with segmental GBM thinning.  Assessment:  Evaluation for blood in sputum Unclear if this is actually hemoptysis.  She has been worked up for hematuria with anti-PR-3 antibody. Kidney biopsy with no evidence of vasculitis.  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 There is also question of GERD with esophageal stricture as she is seeing a gastroenterologist for dilatation.  CT reviewed with no obvious source of hemoptysis She is ready to proceed with bronchoscope for airway inspection.   Sinus congestion, allergies Singulair was recommended at last clinic visit but she wants to avoid due to side effects I advised her to take over-the-counter Zyrtec or Claritin  Plan/Recommendations: Schedule bronchoscopy  Marshell Garfinkel MD [facility] 05/01/2022, 4:15 PM  CC: [name], MD

## 2022-05-02 ENCOUNTER — Other Ambulatory Visit: Payer: Medicaid Other

## 2022-05-11 ENCOUNTER — Other Ambulatory Visit: Payer: Self-pay | Admitting: Pulmonary Disease

## 2022-05-11 ENCOUNTER — Other Ambulatory Visit: Payer: Self-pay

## 2022-05-11 DIAGNOSIS — R042 Hemoptysis: Secondary | ICD-10-CM

## 2022-05-11 LAB — SARS CORONAVIRUS 2 (TAT 6-24 HRS): SARS Coronavirus 2: NEGATIVE

## 2022-05-14 ENCOUNTER — Other Ambulatory Visit: Payer: Self-pay | Admitting: Pulmonary Disease

## 2022-05-14 ENCOUNTER — Encounter (HOSPITAL_COMMUNITY): Payer: Self-pay | Admitting: Pulmonary Disease

## 2022-05-14 ENCOUNTER — Other Ambulatory Visit: Payer: Self-pay

## 2022-05-14 ENCOUNTER — Ambulatory Visit (HOSPITAL_COMMUNITY)
Admission: RE | Admit: 2022-05-14 | Discharge: 2022-05-14 | Disposition: A | Discharge status: Home / Self Care | Payer: Medicaid Other | Attending: Pulmonary Disease | Admitting: Pulmonary Disease

## 2022-05-14 ENCOUNTER — Ambulatory Visit (HOSPITAL_BASED_OUTPATIENT_CLINIC_OR_DEPARTMENT_OTHER): Payer: Medicaid Other | Admitting: Anesthesiology

## 2022-05-14 ENCOUNTER — Encounter (HOSPITAL_COMMUNITY)
Admission: RE | Disposition: A | Discharge status: Home / Self Care | Payer: Self-pay | Source: Home / Self Care | Attending: Pulmonary Disease

## 2022-05-14 ENCOUNTER — Ambulatory Visit (HOSPITAL_COMMUNITY): Payer: Medicaid Other

## 2022-05-14 ENCOUNTER — Ambulatory Visit (HOSPITAL_COMMUNITY): Payer: Medicaid Other | Admitting: Anesthesiology

## 2022-05-14 DIAGNOSIS — I2510 Atherosclerotic heart disease of native coronary artery without angina pectoris: Secondary | ICD-10-CM | POA: Diagnosis not present

## 2022-05-14 DIAGNOSIS — I739 Peripheral vascular disease, unspecified: Secondary | ICD-10-CM | POA: Diagnosis not present

## 2022-05-14 DIAGNOSIS — K219 Gastro-esophageal reflux disease without esophagitis: Secondary | ICD-10-CM | POA: Diagnosis not present

## 2022-05-14 DIAGNOSIS — M1990 Unspecified osteoarthritis, unspecified site: Secondary | ICD-10-CM | POA: Diagnosis not present

## 2022-05-14 DIAGNOSIS — Z955 Presence of coronary angioplasty implant and graft: Secondary | ICD-10-CM | POA: Insufficient documentation

## 2022-05-14 DIAGNOSIS — R042 Hemoptysis: Secondary | ICD-10-CM

## 2022-05-14 DIAGNOSIS — Z8673 Personal history of transient ischemic attack (TIA), and cerebral infarction without residual deficits: Secondary | ICD-10-CM | POA: Diagnosis not present

## 2022-05-14 HISTORY — PX: VIDEO BRONCHOSCOPY: SHX5072

## 2022-05-14 SURGERY — VIDEO BRONCHOSCOPY WITHOUT FLUORO
Anesthesia: Monitor Anesthesia Care

## 2022-05-14 MED ORDER — EPINEPHRINE 1 MG/10ML IJ SOSY
PREFILLED_SYRINGE | INTRAMUSCULAR | Status: AC
  Filled 2022-05-14: qty 10

## 2022-05-14 MED ORDER — PHENYLEPHRINE HCL 0.25 % NA SOLN: 1.0000 | Freq: Four times a day (QID) | NASAL | Status: DC | PRN

## 2022-05-14 MED ORDER — FENTANYL CITRATE (PF) 100 MCG/2ML IJ SOLN
INTRAMUSCULAR | Status: DC | PRN
  Administered 2022-05-14 (×2): 50 ug via INTRAVENOUS

## 2022-05-14 MED ORDER — BUTAMBEN-TETRACAINE-BENZOCAINE 2-2-14 % EX AERO: 1.0000 | INHALATION_SPRAY | Freq: Once | CUTANEOUS | Status: DC

## 2022-05-14 MED ORDER — PROPOFOL 500 MG/50ML IV EMUL
INTRAVENOUS | Status: DC | PRN
  Administered 2022-05-14: 140 ug/kg/min via INTRAVENOUS

## 2022-05-14 MED ORDER — LIDOCAINE HCL URETHRAL/MUCOSAL 2 % EX GEL: 1.0000 | Freq: Once | CUTANEOUS | Status: DC

## 2022-05-14 MED ORDER — ONDANSETRON HCL 4 MG/2ML IJ SOLN
INTRAMUSCULAR | Status: DC | PRN
  Administered 2022-05-14: 4 mg via INTRAVENOUS

## 2022-05-14 MED ORDER — ONDANSETRON HCL 4 MG/2ML IJ SOLN
INTRAMUSCULAR | Status: AC
  Filled 2022-05-14: qty 2

## 2022-05-14 MED ORDER — FENTANYL CITRATE (PF) 100 MCG/2ML IJ SOLN
INTRAMUSCULAR | Status: AC
  Filled 2022-05-14: qty 2

## 2022-05-14 MED ORDER — ONDANSETRON HCL 4 MG PO TABS: 4.0000 mg | ORAL_TABLET | Freq: Three times a day (TID) | ORAL | 0 refills | Status: DC | PRN

## 2022-05-14 MED ORDER — ONDANSETRON HCL 4 MG/2ML IJ SOLN
4.0000 mg | Freq: Once | INTRAMUSCULAR | Status: AC
  Administered 2022-05-14: 4 mg via INTRAVENOUS

## 2022-05-14 MED ORDER — LACTATED RINGERS IV SOLN: INTRAVENOUS | Status: DC | PRN

## 2022-05-14 NOTE — Transfer of Care (Signed)
Immediate Anesthesia Transfer of Care Note  Patient: Virginia Shaw  Procedure(s) Performed: VIDEO BRONCHOSCOPY WITHOUT FLUORO  Patient Location: PACU and Endoscopy Unit  Anesthesia Type:MAC  Level of Consciousness: awake, alert  and oriented  Airway & Oxygen Therapy: Patient Spontanous Breathing  Post-op Assessment: Report given to RN and Post -op Vital signs reviewed and stable  Post vital signs: Reviewed and stable  Last Vitals:  Vitals Value Taken Time  BP    Temp    Pulse    Resp    SpO2      Last Pain:  Vitals:   05/14/22 1306  TempSrc: Oral  PainSc: 0-No pain         Complications: No notable events documented.

## 2022-05-14 NOTE — Op Note (Signed)
[facility] Cardiopulmonary Patient Name: Virginia Shaw Procedure Date: 05/14/2022 MRN: [id number] Attending MD: Marshell Garfinkel , MD Date of Birth: [birth year] CSN: [id number] Age: 56 Admit Type: Outpatient Ethnicity: Not Hispanic or Latino Procedure:             Bronchoscopy Indications:           Hemoptysis with normal CXR Providers:             Marshell Garfinkel, MD, Allayne Gitelman, RN, Darliss Cheney,                         Technician Referring MD:           Medicines:             Propofol per Anesthesia Complications:         No immediate complications Estimated Blood Loss:  Estimated blood loss: none. Procedure:      Pre-Anesthesia Assessment:      - A History and Physical has been performed. The patient's medications,       allergies and sensitivities have been reviewed.      After obtaining informed consent, the bronchoscope was passed under       direct vision. Throughout the procedure, the patient's blood pressure,       pulse, and oxygen saturations were monitored continuously. the [id number]       ([id number]) Olympus bronoscope was introduced through the left nostril and       advanced to the tracheobronchial tree of both lungs. The total duration       of the procedure was 15 minutes. Findings:      The nasopharynx/oropharynx appears normal. The larynx appears normal.       The vocal cords appear normal. The subglottic space is normal. The       trachea is of normal caliber. The carina is sharp. The tracheobronchial       tree was examined to at least the first subsegmental level. Bronchial       mucosa and anatomy are normal; there are no endobronchial lesions, and       no secretions. Impression:      - The airway examination was normal.      - No specimens collected. Moderate Sedation:      An independent trained observer was present and continuously monitored       the patient. Recommendation:      - Follow up in clinic as previously scheduled. Procedure  Code(s):      --- Professional ---      [phone], Bronchoscopy, rigid or flexible, including fluoroscopic guidance,       when performed; diagnostic, with cell washing, when performed (separate       procedure) Diagnosis Code(s):      --- Professional ---      R04.2, Hemoptysis CPT copyright 2019 American Medical Association. All rights reserved. The codes documented in this report are preliminary and upon coder review may  be revised to meet current compliance requirements. Marshell Garfinkel, MD Marshell Garfinkel, MD 05/14/2022 3:19:39 PM Number of Addenda: 0 Scope In: Scope Out:

## 2022-05-14 NOTE — Anesthesia Preprocedure Evaluation (Addendum)
Anesthesia Evaluation  Patient identified by MRN, date of birth, ID band Patient awake    Reviewed: Allergy & Precautions, NPO status , Patient's Chart, lab work & pertinent test results  History of Anesthesia Complications (+) PONV and history of anesthetic complications  Airway Mallampati: III  TM Distance: >3 FB Neck ROM: Full  Mouth opening: Limited Mouth Opening  Dental  (+) Chipped, Dental Advisory Given,    Pulmonary neg pulmonary ROS,    Pulmonary exam normal breath sounds clear to auscultation       Cardiovascular + CAD, + Cardiac Stents and + Peripheral Vascular Disease  Normal cardiovascular exam Rhythm:Regular Rate:Normal     Neuro/Psych  Headaches, PSYCHIATRIC DISORDERS Anxiety TIA   GI/Hepatic Neg liver ROS, GERD  ,  Endo/Other  negative endocrine ROS  Renal/GU negative Renal ROS  negative genitourinary   Musculoskeletal  (+) Arthritis ,   Abdominal   Peds  Hematology negative hematology ROS (+)   Anesthesia Other Findings Systemic sclerosis  Bronch for hemoptysis, last episode May 2023   Reproductive/Obstetrics                            Anesthesia Physical Anesthesia Plan  ASA: 3  Anesthesia Plan: MAC   Post-op Pain Management:    Induction: Intravenous  PONV Risk Score and Plan: Propofol infusion and Treatment may vary due to age or medical condition  Airway Management Planned: Natural Airway  Additional Equipment:   Intra-op Plan:   Post-operative Plan:   Informed Consent: I have reviewed the patients History and Physical, chart, labs and discussed the procedure including the risks, benefits and alternatives for the proposed anesthesia with the patient or authorized representative who has indicated his/her understanding and acceptance.     Dental advisory given  Plan Discussed with: CRNA  Anesthesia Plan Comments:         Anesthesia Quick  Evaluation

## 2022-05-15 ENCOUNTER — Encounter (HOSPITAL_COMMUNITY): Payer: Self-pay | Admitting: Pulmonary Disease

## 2022-05-15 NOTE — Anesthesia Postprocedure Evaluation (Signed)
Anesthesia Post Note  Patient: Virginia Shaw  Procedure(s) Performed: VIDEO BRONCHOSCOPY WITHOUT FLUORO     Patient location during evaluation: Endoscopy Anesthesia Type: MAC Level of consciousness: awake and alert Pain management: pain level controlled Vital Signs Assessment: post-procedure vital signs reviewed and stable Respiratory status: spontaneous breathing, nonlabored ventilation, respiratory function stable and patient connected to nasal cannula oxygen Cardiovascular status: blood pressure returned to baseline and stable Postop Assessment: no apparent nausea or vomiting Anesthetic complications: no   No notable events documented.  Last Vitals:  Vitals:   05/14/22 1533 05/14/22 1551  BP: (!) 91/55 (!) 92/54  Pulse: 72 68  Resp: 12 17  Temp:    SpO2: 94% 97%    Last Pain:  Vitals:   05/14/22 1551  TempSrc:   PainSc: 0-No pain                 Birttany Dechellis L Tarrin Lebow

## 2022-05-28 ENCOUNTER — Ambulatory Visit: Payer: Medicaid Other | Admitting: Specialist

## 2022-05-28 ENCOUNTER — Encounter: Payer: Self-pay | Admitting: Specialist

## 2022-05-28 VITALS — BP 101/67 | HR 72 | Ht 59.0 in | Wt 142.0 lb

## 2022-05-28 DIAGNOSIS — M5136 Other intervertebral disc degeneration, lumbar region: Secondary | ICD-10-CM

## 2022-05-28 NOTE — Progress Notes (Signed)
Office Visit Note   Patient: Virginia Shaw           Date of Birth: 1965/11/11           MRN: [id number] Visit Date: 05/28/2022              Requested by: [name], [place],  [place] [id number] PCP: [name], MD   Assessment & Plan: Visit Diagnoses:  1. Degenerative disc disease, lumbar     Plan: Avoid frequent bending and stooping  No lifting greater than 10 lbs. May use ice or moist heat for pain. Weight loss is of benefit. Best medication for lumbar disc disease is arthritis medications like motrin, celebrex and naprosyn. Exercise is important to improve your indurance and does allow people to function better inspite of back pain.    Follow-Up Instructions: Return in about 3 months (around 08/28/2022).   Orders:  No orders of the defined types were placed in this encounter.  No orders of the defined types were placed in this encounter.     Procedures: No procedures performed   Clinical Data: No additional findings.   Subjective: Chief Complaint  Patient presents with   Lower Back - Follow-up    MRI Review    HPI  Review of Systems   Objective: Vital Signs: BP 101/67 (BP Location: Left Arm, Patient Position: Sitting)   Pulse 72   Ht '4\' 11"'$  (1.499 m)   Wt 142 lb (64.4 kg)   BMI 28.68 kg/m   Physical Exam  Ortho Exam  Specialty Comments:  No specialty comments available.  Imaging: No results found.   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[place]) 01/11/2014   Chronic abdominal pain 01/11/2014   Chronic  venous insufficiency 10/30/2013   Varicose veins 10/30/2013   Chest tightness 03/25/2012   PITUITARY ADENOMA, 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   RECTAL BLEEDING 08/26/2008   UTI'S, RECURRENT 08/26/2008   Past Medical History:  Diagnosis Date   Abnormal involuntary movements(781.0)    Allergic rhinitis, cause unspecified    Anxiety state, unspecified    Cervical spondylosis without myelopathy    Chronic headaches    Disturbance of skin sensation    Fatty liver    per patient, "not sure but does remember being told"   GERD (gastroesophageal reflux disease)    History of chicken pox    Irritable bowel syndrome    Low blood pressure    Lumbago    Myalgia and myositis, unspecified    Osteoporosis    Other and unspecified angina pectoris    Other malaise and fatigue    Peripheral vascular disease ([place])    per patient "diagnosed with it about 13-14 years ago and does not currently see someone for it, saw Dr. 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   Shortness of breath on exertion    per patient, "sometimes depends on distance"   Somatization disorder    Spasm of muscle    Systemic sclerosis ([place])    TIA (transient ischemic attack)  Unspecified hereditary and idiopathic peripheral neuropathy     Family History  Problem Relation Age of Onset   Hypertension Father    Hyperlipidemia Father    Diabetes Father    Stroke Father    Cancer Maternal Uncle        lung   Cancer Paternal Aunt        lung   Migraines Mother    Heart attack Paternal Grandmother    Heart attack Paternal Grandfather    Hypertension Paternal Grandfather    Migraines Sister     Past Surgical History:  Procedure Laterality Date   ABDOMINAL HYSTERECTOMY  1997   arnold chiari malformation  2013   BACK SURGERY  2001, 2011,2013   Lumbar x 2   BALLOON DILATION N/A 12/22/2019   Procedure: BALLOON DILATION;  Surgeon: Wilford Corner, MD;  Location: [facility];  Service: Endoscopy;  Laterality: N/A;   barium swallow test  10/2019   BLADDER SURGERY  2011   CARDIAC CATHETERIZATION     CARPAL TUNNEL RELEASE Left    CATARACT EXTRACTION     CERVICAL SPINE SURGERY  2013   CHOLECYSTECTOMY     COLONOSCOPY  2009   "Normal" Dr Sharlett Iles   COLONOSCOPY  2002   "Normal" Dr Freddrick March   COLONOSCOPY  ~2013   Dr Ferdinand Lango.  "Chest pain with the prep" = aborted   COLONOSCOPY WITH PROPOFOL N/A 03/29/2020   Procedure: COLONOSCOPY WITH PROPOFOL;  Surgeon: Wilford Corner, MD;  [facility]: [facility];  Service: Endoscopy;  Laterality: N/A;   CRANIECTOMY SUBOCCIPITAL W/ CERVICAL LAMINECTOMY / CHIARI  2012   Dr Annette Stable, Chiari decompression with suboccipital craniectomy and   CYSTOCELE REPAIR  2002   Dr Gaetano Net   ESOPHAGOGASTRODUODENOSCOPY (EGD) WITH PROPOFOL N/A 12/22/2019   Procedure: ESOPHAGOGASTRODUODENOSCOPY (EGD) WITH PROPOFOL;  Surgeon: Wilford Corner, MD;  [facility];  Service: Endoscopy;  Laterality: N/A;   INSERTION OF MESH Bilateral 10/04/2020   Procedure: INSERTION OF MESH;  Surgeon: Ralene Ok, MD;  Location: [facility];  Service: General;  Laterality: Bilateral;   [place], [place]   PITUITARY EXCISION  2010   pituitary cyst removed   RECTOCELE REPAIR  2002   Dr. Gaetano Net   RENAL BIOPSY  08/04/2019   tear duct tubes  2005   TUBAL LIGATION     VIDEO BRONCHOSCOPY N/A 05/14/2022   Procedure: VIDEO BRONCHOSCOPY WITHOUT FLUORO;  Surgeon: Marshell Garfinkel, MD;  [facility];  Service: Cardiopulmonary;  Laterality: N/A;   XI ROBOTIC ASSISTED INGUINAL HERNIA REPAIR WITH MESH Bilateral 10/04/2020   Procedure: XI ROBOTIC ASSISTED BILATERAL INGUINAL HERNIA REPAIR WITH MESH;  Surgeon: Ralene Ok, MD;  Location: [facility];  Service: General;  Laterality: Bilateral;   Social History   Occupational History    Employer: UNEMPLOYED  Tobacco Use   Smoking status: Never    Passive exposure: Past   Smokeless tobacco:  Never  Vaping Use   Vaping Use: Never used  Substance and Sexual Activity   Alcohol use: No   Drug use: No   Sexual activity: Never    Birth control/protection: Abstinence

## 2022-05-28 NOTE — Patient Instructions (Signed)
Avoid frequent bending and stooping  No lifting greater than 10 lbs. May use ice or moist heat for pain. Weight loss is of benefit. Best medication for lumbar disc disease is arthritis medications like motrin, celebrex and naprosyn. Exercise is important to improve your indurance and does allow people to function better inspite of back pain. Injection with steroid may be of benefit. Hemp CBD capsules, amazon.com 5,000-7,000 mg per bottle, 60 capsules per bottle, take one capsule twice a day. Cane in the left hand to use with left leg weight bearing. Follow-Up Instructions: No follow-ups on file.

## 2022-06-07 ENCOUNTER — Ambulatory Visit: Payer: Medicaid Other | Admitting: Surgery

## 2022-06-08 ENCOUNTER — Ambulatory Visit: Payer: Medicaid Other | Admitting: Surgery

## 2022-06-11 ENCOUNTER — Telehealth: Payer: Self-pay | Admitting: Specialist

## 2022-06-11 NOTE — Telephone Encounter (Signed)
Patient request a Rx for a cane.

## 2022-06-13 NOTE — Progress Notes (Unsigned)
Office Visit Note  Patient: Virginia Shaw             Date of Birth: [birth year]           MRN: [id number]             PCP: [name], MD Referring: [name], MD Visit Date: 06/14/2022 Occupation: '@GUAROCC'$ @  Subjective:  No chief complaint on file.   History of Present Illness: Virginia Shaw is a 56 y.o. female ***   Activities of Daily Living:  Patient reports morning stiffness for *** {minute/hour:19697}.   Patient {ACTIONS;DENIES/REPORTS:21021675::"Denies"} nocturnal pain.  Difficulty dressing/grooming: {ACTIONS;DENIES/REPORTS:21021675::"Denies"} Difficulty climbing stairs: {ACTIONS;DENIES/REPORTS:21021675::"Denies"} Difficulty getting out of chair: {ACTIONS;DENIES/REPORTS:21021675::"Denies"} Difficulty using hands for taps, buttons, cutlery, and/or writing: {ACTIONS;DENIES/REPORTS:21021675::"Denies"}  No Rheumatology ROS completed.   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/11/2014   Chronic abdominal pain 01/11/2014   Chronic venous insufficiency 10/30/2013   Varicose veins 10/30/2013   Chest tightness 03/25/2012   PITUITARY ADENOMA, 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   RECTAL BLEEDING 08/26/2008   UTI'S, RECURRENT 08/26/2008    Past Medical History:  Diagnosis Date   Abnormal involuntary movements(781.0)    Allergic rhinitis, cause unspecified    Anxiety state, unspecified    Cervical spondylosis without myelopathy    Chronic headaches    Disturbance of skin sensation    Fatty liver    per patient, "not sure but does remember being told"   GERD  (gastroesophageal reflux disease)    History of chicken pox    Irritable bowel syndrome    Low blood pressure    Lumbago    Myalgia and myositis, unspecified    Osteoporosis    Other and unspecified angina pectoris    Other malaise and fatigue    Peripheral vascular disease ([place])    per patient "diagnosed with it about 13-14 years ago and does not currently see someone for it, saw Dr. 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   Shortness of breath on exertion    per patient, "sometimes depends on distance"   Somatization disorder    Spasm of muscle    Systemic sclerosis ([place])    TIA (transient ischemic attack)    Unspecified hereditary and idiopathic peripheral neuropathy     Family History  Problem Relation Age of Onset   Hypertension Father    Hyperlipidemia Father    Diabetes Father    Stroke Father    Cancer Maternal Uncle        lung   Cancer Paternal Aunt        lung   Migraines Mother    Heart attack Paternal Grandmother    Heart attack Paternal Grandfather    Hypertension Paternal Grandfather    Migraines Sister    Past Surgical History:  Procedure Laterality Date   ABDOMINAL HYSTERECTOMY  1997   arnold chiari malformation  2013   BACK SURGERY  2001, 2011,2013   Lumbar x 2   BALLOON DILATION N/A 12/22/2019   Procedure: BALLOON DILATION;  Surgeon: Wilford Corner, MD;  [facility];  Service: Endoscopy;  Laterality: N/A;   barium swallow test  10/2019   BLADDER SURGERY  2011   CARDIAC CATHETERIZATION     CARPAL TUNNEL RELEASE Left    CATARACT EXTRACTION     CERVICAL SPINE SURGERY  2013   CHOLECYSTECTOMY     COLONOSCOPY  2009   "Normal" Dr Sharlett Iles   COLONOSCOPY  2002   "Normal" Dr Freddrick March   COLONOSCOPY  ~2013   Dr Ferdinand Lango.  "Chest pain with the prep" = aborted   COLONOSCOPY WITH PROPOFOL N/A 03/29/2020   Procedure: COLONOSCOPY WITH PROPOFOL;  Surgeon: Wilford Corner, MD;   [facility];  Service: Endoscopy;  Laterality: N/A;   CRANIECTOMY SUBOCCIPITAL W/ CERVICAL LAMINECTOMY / CHIARI  2012   Dr Annette Stable, Chiari decompression with suboccipital craniectomy and   CYSTOCELE REPAIR  2002   Dr Gaetano Net   ESOPHAGOGASTRODUODENOSCOPY (EGD) WITH PROPOFOL N/A 12/22/2019   Procedure: ESOPHAGOGASTRODUODENOSCOPY (EGD) WITH PROPOFOL;  Surgeon: Wilford Corner, MD;  [facility];  Service: Endoscopy;  Laterality: N/A;   INSERTION OF MESH Bilateral 10/04/2020   Procedure: INSERTION OF MESH;  Surgeon: Ralene Ok, MD;  Location: [facility];  Service: General;  Laterality: Bilateral;   [place], [place]   PITUITARY EXCISION  2010   pituitary cyst removed   RECTOCELE REPAIR  2002   Dr. Gaetano Net   RENAL BIOPSY  08/04/2019   tear duct tubes  2005   TUBAL LIGATION     VIDEO BRONCHOSCOPY N/A 05/14/2022   Procedure: VIDEO BRONCHOSCOPY WITHOUT FLUORO;  Surgeon: Marshell Garfinkel, MD;  [facility];  Service: Cardiopulmonary;  Laterality: N/A;   XI ROBOTIC ASSISTED INGUINAL HERNIA REPAIR WITH MESH Bilateral 10/04/2020   Procedure: XI ROBOTIC ASSISTED BILATERAL INGUINAL HERNIA REPAIR WITH MESH;  Surgeon: Ralene Ok, MD;  Location: [facility];  Service: General;  Laterality: Bilateral;   Social History   Social History Narrative   Lives at home with children. ( 2 daughters)   Regular exercise-yes   Caffeine Use-no   GED   Not Working outside home   Immunization History  Administered Date(s) Administered   Tdap 10/08/2018     Objective: Vital Signs: There were no vitals taken for this visit.   Physical Exam   Musculoskeletal Exam: ***  CDAI Exam: CDAI Score: -- Patient Global: --; Provider Global: -- Swollen: --; Tender: -- Joint Exam 06/14/2022   No joint exam has been documented for this visit   There is currently no information documented on the homunculus. Go to the Rheumatology activity and complete the homunculus joint  exam.  Investigation: No additional findings.  Imaging: DG CHEST PORT 1 VIEW  Result Date: 05/14/2022 CLINICAL DATA:  Post bronchoscopy EXAM: PORTABLE CHEST 1 VIEW COMPARISON:  Radiograph 01/01/2022 FINDINGS: Unchanged cardiomediastinal silhouette. There is no focal airspace consolidation. There is no pleural effusion. No pneumothorax. There is no acute osseous abnormality. IMPRESSION: No evidence of acute cardiopulmonary disease. Electronically Signed   By: Maurine Simmering M.D.   On: 05/14/2022 15:28    Recent Labs: Lab Results  Component Value Date   WBC 7.3 01/01/2022   HGB 13.7 01/01/2022   PLT 391 01/01/2022   NA 138 01/01/2022   K 3.9 01/01/2022   CL 103 01/01/2022   CO2 28 01/01/2022   GLUCOSE 95 01/01/2022   BUN 12 01/01/2022   CREATININE 0.58 01/01/2022   BILITOT 0.4 01/01/2022   ALKPHOS 61 01/01/2022   AST 22 01/01/2022   ALT 23 01/01/2022   PROT  7.5 01/01/2022   ALBUMIN 4.1 01/01/2022   CALCIUM 8.9 01/01/2022   GFRAA >60 04/21/2020    Speciality Comments: No specialty comments available.  Procedures:  No procedures performed Allergies: Iodinated contrast media, Penicillins, Prednisone, Propoxyphene, Acetaminophen, Benzonatate, Buprenorphine hcl, Citric acid, Clindamycin, Codeine, Diazepam, Diclofenac, Esomeprazole, Morphine and related, Nsaids, Pantoprazole sodium, Albuterol and levalbuterol, Ceftin [cefuroxime axetil], Cephalexin, Cortisone, Diltiazem, Doxycycline, Hydrocodone-acetaminophen, Ibuprofen, Loracarbef, Macrobid [nitrofurantoin monohyd macro], Maxalt [rizatriptan], Methylprednisolone sodium succ, Naproxen, Omeprazole, Oxycodone-acetaminophen, Phenazopyridine hcl, Tomato, Budesonide, and Lidocaine   Assessment / Plan:     Visit Diagnoses: No diagnosis found.  Orders: No orders of the defined types were placed in this encounter.  No orders of the defined types were placed in this encounter.   Face-to-face time spent with patient was *** minutes.  Greater than 50% of time was spent in counseling and coordination of care.  Follow-Up Instructions: No follow-ups on file.   Earnestine Mealing, CMA  Note - This record has been created using Editor, commissioning.  Chart creation errors have been sought, but may not always  have been located. Such creation errors do not reflect on  the standard of medical care.

## 2022-06-14 ENCOUNTER — Encounter: Payer: Self-pay | Admitting: Rheumatology

## 2022-06-14 ENCOUNTER — Ambulatory Visit: Payer: Medicaid Other | Attending: Rheumatology | Admitting: Rheumatology

## 2022-06-14 VITALS — BP 108/71 | HR 81 | Ht 60.0 in | Wt 141.0 lb

## 2022-06-14 DIAGNOSIS — I201 Angina pectoris with documented spasm: Secondary | ICD-10-CM

## 2022-06-14 DIAGNOSIS — G5603 Carpal tunnel syndrome, bilateral upper limbs: Secondary | ICD-10-CM

## 2022-06-14 DIAGNOSIS — G894 Chronic pain syndrome: Secondary | ICD-10-CM

## 2022-06-14 DIAGNOSIS — M1990 Unspecified osteoarthritis, unspecified site: Secondary | ICD-10-CM

## 2022-06-14 DIAGNOSIS — M546 Pain in thoracic spine: Secondary | ICD-10-CM

## 2022-06-14 DIAGNOSIS — M5136 Other intervertebral disc degeneration, lumbar region: Secondary | ICD-10-CM

## 2022-06-14 DIAGNOSIS — M2550 Pain in unspecified joint: Secondary | ICD-10-CM | POA: Diagnosis not present

## 2022-06-14 DIAGNOSIS — Z8679 Personal history of other diseases of the circulatory system: Secondary | ICD-10-CM

## 2022-06-14 DIAGNOSIS — Z8719 Personal history of other diseases of the digestive system: Secondary | ICD-10-CM

## 2022-06-14 DIAGNOSIS — M65332 Trigger finger, left middle finger: Secondary | ICD-10-CM

## 2022-06-14 DIAGNOSIS — M5030 Other cervical disc degeneration, unspecified cervical region: Secondary | ICD-10-CM

## 2022-06-14 DIAGNOSIS — M797 Fibromyalgia: Secondary | ICD-10-CM

## 2022-06-14 DIAGNOSIS — M19041 Primary osteoarthritis, right hand: Secondary | ICD-10-CM

## 2022-06-14 DIAGNOSIS — I872 Venous insufficiency (chronic) (peripheral): Secondary | ICD-10-CM

## 2022-06-14 DIAGNOSIS — M19042 Primary osteoarthritis, left hand: Secondary | ICD-10-CM

## 2022-06-14 DIAGNOSIS — M7062 Trochanteric bursitis, left hip: Secondary | ICD-10-CM

## 2022-06-14 DIAGNOSIS — M5416 Radiculopathy, lumbar region: Secondary | ICD-10-CM

## 2022-06-14 DIAGNOSIS — D352 Benign neoplasm of pituitary gland: Secondary | ICD-10-CM

## 2022-06-14 DIAGNOSIS — M51369 Other intervertebral disc degeneration, lumbar region without mention of lumbar back pain or lower extremity pain: Secondary | ICD-10-CM

## 2022-06-14 DIAGNOSIS — M7918 Myalgia, other site: Secondary | ICD-10-CM

## 2022-06-14 NOTE — Patient Instructions (Addendum)
Back Exercises The following exercises strengthen the muscles that help to support the trunk (torso) and back. They also help to keep the lower back flexible. Doing these exercises can help to prevent or lessen existing low back pain. If you have back pain or discomfort, try doing these exercises 2-3 times each day or as told by your health care provider. As your pain improves, do them once each day, but increase the number of times that you repeat the steps for each exercise (do more repetitions). To prevent the recurrence of back pain, continue to do these exercises once each day or as told by your health care provider. Do exercises exactly as told by your health care provider and adjust them as directed. It is normal to feel mild stretching, pulling, tightness, or discomfort as you do these exercises, but you should stop right away if you feel sudden pain or your pain gets worse. Exercises Single knee to chest Repeat these steps 3-5 times for each leg: Lie on your back on a firm bed or the floor with your legs extended. Bring one knee to your chest. Your other leg should stay extended and in contact with the floor. Hold your knee in place by grabbing your knee or thigh with both hands and hold. Pull on your knee until you feel a gentle stretch in your lower back or buttocks. Hold the stretch for 10-30 seconds. Slowly release and straighten your leg.  Pelvic tilt Repeat these steps 5-10 times: Lie on your back on a firm bed or the floor with your legs extended. Bend your knees so they are pointing toward the ceiling and your feet are flat on the floor. Tighten your lower abdominal muscles to press your lower back against the floor. This motion will tilt your pelvis so your tailbone points up toward the ceiling instead of pointing to your feet or the floor. With gentle tension and even breathing, hold this position for 5-10 seconds.  Cat-cow Repeat these steps until your lower back becomes  more flexible: Get into a hands-and-knees position on a firm bed or the floor. Keep your hands under your shoulders, and keep your knees under your hips. You may place padding under your knees for comfort. Let your head hang down toward your chest. Contract your abdominal muscles and point your tailbone toward the floor so your lower back becomes rounded like the back of a cat. Hold this position for 5 seconds. Slowly lift your head, let your abdominal muscles relax, and point your tailbone up toward the ceiling so your back forms a sagging arch like the back of a cow. Hold this position for 5 seconds.  Press-ups Repeat these steps 5-10 times: Lie on your abdomen (face-down) on a firm bed or the floor. Place your palms near your head, about shoulder-width apart. Keeping your back as relaxed as possible and keeping your hips on the floor, slowly straighten your arms to raise the top half of your body and lift your shoulders. Do not use your back muscles to raise your upper torso. You may adjust the placement of your hands to make yourself more comfortable. Hold this position for 5 seconds while you keep your back relaxed. Slowly return to lying flat on the floor.  Bridges Repeat these steps 10 times: Lie on your back on a firm bed or the floor. Bend your knees so they are pointing toward the ceiling and your feet are flat on the floor. Your arms should be flat   at your sides, next to your body. Tighten your buttocks muscles and lift your buttocks off the floor until your waist is at almost the same height as your knees. You should feel the muscles working in your buttocks and the back of your thighs. If you do not feel these muscles, slide your feet 1-2 inches (2.5-5 cm) farther away from your buttocks. Hold this position for 3-5 seconds. Slowly lower your hips to the starting position, and allow your buttocks muscles to relax completely. If this exercise is too easy, try doing it with your arms  crossed over your chest. Abdominal crunches Repeat these steps 5-10 times: Lie on your back on a firm bed or the floor with your legs extended. Bend your knees so they are pointing toward the ceiling and your feet are flat on the floor. Cross your arms over your chest. Tip your chin slightly toward your chest without bending your neck. Tighten your abdominal muscles and slowly raise your torso high enough to lift your shoulder blades a tiny bit off the floor. Avoid raising your torso higher than that because it can put too much stress on your lower back and does not help to strengthen your abdominal muscles. Slowly return to your starting position.  Back lifts Repeat these steps 5-10 times: Lie on your abdomen (face-down) with your arms at your sides, and rest your forehead on the floor. Tighten the muscles in your legs and your buttocks. Slowly lift your chest off the floor while you keep your hips pressed to the floor. Keep the back of your head in line with the curve in your back. Your eyes should be looking at the floor. Hold this position for 3-5 seconds. Slowly return to your starting position.  Contact a health care provider if: Your back pain or discomfort gets much worse when you do an exercise. Your worsening back pain or discomfort does not lessen within 2 hours after you exercise. If you have any of these problems, stop doing these exercises right away. Do not do them again unless your health care provider says that you can. Get help right away if: You develop sudden, severe back pain. If this happens, stop doing the exercises right away. Do not do them again unless your health care provider says that you can. This information is not intended to replace advice given to you by your health care provider. Make sure you discuss any questions you have with your health care provider. Document Revised: 04/18/2021 Document Reviewed: 01/04/2021 Elsevier Patient Education  [place] [facility] Ask your health care provider which exercises are safe for you. Do exercises exactly as told by your health care provider and adjust them as directed. It is normal to feel mild stretching, pulling, tightness, or discomfort as you do these exercises. Stop right away if you feel sudden pain or your pain gets significantly worse. Do not begin these exercises until told by your health care provider. Stretching and range-of-motion exercises These exercises warm up your muscles and joints and improve the movement and flexibility of your hip and pelvis. Quadriceps stretch, prone  Lie on your abdomen (prone position) on a firm surface, such as a bed or padded floor. Bend your left / right knee and reach back to hold your ankle or pant leg. If you cannot reach your ankle or pant leg, loop a belt around your foot and grab the belt instead. Gently pull your heel toward your buttocks.  Your knee should not slide out to the side. You should feel a stretch in the front of your thigh and knee (quadriceps). Hold this position for __________ seconds. Repeat __________ times. Complete this exercise __________ times a day. Iliotibial band stretch An iliotibial band is a strong band of muscle tissue that runs from the outer side of your hip to the outer side of your thigh and knee. Lie on your side with your left / right leg in the top position. Bend both of your knees and grab your left / right ankle. Stretch out your bottom arm to help you balance. Slowly bring your top knee back so your thigh goes behind your trunk. Slowly lower your top leg toward the floor until you feel a gentle stretch on the outside of your left / right hip and thigh. If you do not feel a stretch and your knee will not fall farther, place the heel of your other foot on top of your knee and pull your knee down toward the floor with your foot. Hold this position for __________ seconds. Repeat __________  times. Complete this exercise __________ times a day. Strengthening exercises These exercises build strength and endurance in your hip and pelvis. Endurance is the ability to use your muscles for a long time, even after they get tired. Straight leg raises, side-lying This exercise strengthens the muscles that rotate the leg at the hip and move it away from your body (hip abductors). Lie on your side with your left / right leg in the top position. Lie so your head, shoulder, hip, and knee line up. You may bend your bottom knee to help you balance. Roll your hips slightly forward so your hips are stacked directly over each other and your left / right knee is facing forward. Tense the muscles in your outer thigh and lift your top leg 4-6 inches (10-15 cm). Hold this position for __________ seconds. Slowly lower your leg to return to the starting position. Let your muscles relax completely before doing another repetition. Repeat __________ times. Complete this exercise __________ times a day. Leg raises, prone This exercise strengthens the muscles that move the hips backward (hip extensors). Lie on your abdomen (prone position) on your bed or a firm surface. You can put a pillow under your hips if that is more comfortable for your lower back. Bend your left / right knee so your foot is straight up in the air. Squeeze your buttocks muscles and lift your left / right thigh off the bed. Do not let your back arch. Tense your thigh muscle as hard as you can without increasing any knee pain. Hold this position for __________ seconds. Slowly lower your leg to return to the starting position and allow it to relax completely. Repeat __________ times. Complete this exercise __________ times a day. Hip hike Stand sideways on a bottom step. Stand on your left / right leg with your other foot unsupported next to the step. You can hold on to a railing or wall for balance if needed. Keep your knees straight and  your torso square. Then lift your left / right hip up toward the ceiling. Slowly let your left / right hip lower toward the floor, past the starting position. Your foot should get closer to the floor. Do not lean or bend your knees. Repeat __________ times. Complete this exercise __________ times a day. This information is not intended to replace advice given to you by your health care provider. Make  sure you discuss any questions you have with your health care provider. Document Revised: 12/30/2019 Document Reviewed: 12/30/2019 Elsevier Patient Education  [place].

## 2022-08-02 ENCOUNTER — Ambulatory Visit (INDEPENDENT_AMBULATORY_CARE_PROVIDER_SITE_OTHER): Payer: Medicaid Other | Admitting: Pulmonary Disease

## 2022-08-02 ENCOUNTER — Encounter: Payer: Self-pay | Admitting: Pulmonary Disease

## 2022-08-02 VITALS — BP 106/62 | HR 80 | Temp 98.3°F | Ht 60.0 in | Wt 142.6 lb

## 2022-08-02 DIAGNOSIS — R0789 Other chest pain: Secondary | ICD-10-CM

## 2022-08-02 DIAGNOSIS — R0602 Shortness of breath: Secondary | ICD-10-CM

## 2022-08-02 MED ORDER — BUDESONIDE-FORMOTEROL FUMARATE 160-4.5 MCG/ACT IN AERO: 2.0000 | INHALATION_SPRAY | Freq: Two times a day (BID) | RESPIRATORY_TRACT | 5 refills | Status: DC

## 2022-08-02 NOTE — Progress Notes (Signed)
Virginia Shaw    [id number]    July 05, 1966  Primary Care Physician:Smith, Hal Hope, MD  Referring Physician: [name], [place] [place],  [place] [id number]  Chief complaint: Follow up for blood in sputum  HPI: 56 year old with history of angina, osteoarthritis, endometriosis, IBS, chronic neck and back pain sent here for evaluation of hemoptysis.  She has had intermittent streaks of blood in the sputum for the past year.  She had an episode of blood with clots in the sputum around May of this year.  Also noted to have proteinuria and hematuria.  Follows with urology at [facility].  She has been evaluated by Dr. Harrie Jeans, nephrology.  Work-up as noted below with elevated anti-proteinase 3 antibody.    She has not had any more episodes of blood in sputum since May.  Blood is not associated with cough.  Denies any dyspnea, sputum production, fevers, chills.  History noted for sinusitis.  She has had nasal surgery in the past.  Also has intermittent episodes of GERD.  She has chronic joint pain which is thought to be secondary to osteoarthritis and degenerative disc disease.  Follows with Dr. Estanislado Pandy.  She does not take NSAIDs due to side effects.  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  She is not on any particular treatment from her nephrologist and is being monitored.  Pets: Has a dog.  No cats, birds, farm animals Occupation: Used to work at a [employer] until 2007.  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  No current ongoing exposures.  Denies any mold, hot tub, Jacuzzi, humidifier Smoking history: Never smoker Travel history: No significant travel history Relevant family history: There is significant family history of lung disease.  Her aunt had a lung transplant.  She is not clear why the transplant was  needed.. Several family members have lung cancer, mesothelioma, COPD.  Interim history: Last seen by me in 2021 when a bronchoscopy is recommended but she did not want to go ahead with the procedure She had a recurrence of hemoptysis in early 2023 and saw Dr. Lamonte Sakai. Bronchoscopy was discussed again but did not go through that  She finally underwent bronchoscopy on 05/14/2022 for airway inspection with no evidence of any abnormality Has not had any more episodes of hemoptysis Complains of intermittent dyspnea, wheezing.  Outpatient Encounter Medications as of 08/02/2022  Medication Sig   albuterol (VENTOLIN HFA) 108 (90 Base) MCG/ACT inhaler Inhale 1 puff into the lungs as needed for shortness of breath.   amLODipine (NORVASC) 5 MG tablet Take 5 mg by mouth daily.    Artificial Tear Ointment (DRY EYES OP) Place 1 drop into both eyes daily as needed (Dry eyes). Saline   CINNAMON PO Take by mouth. Adds to coffee daily   Ginger, Zingiber officinalis, (GINGER PO) Take by mouth. Adds to tea daily   isosorbide mononitrate (IMDUR) 60 MG 24 hr tablet 30 mg 2 (two) times daily.  '30mg'$  twice daily   metoprolol succinate (TOPROL-XL) 25 MG 24 hr tablet Take 12.5 mg by mouth daily.   nitroGLYCERIN (NITROSTAT) 0.4 MG SL tablet Place 0.4 mg under the tongue every 5 (five) minutes x 3 doses as needed for chest pain.    vitamin B-12 (CYANOCOBALAMIN) 1000 MCG tablet Take 1,000 mcg by mouth 3 (three)  times a week.   [DISCONTINUED] ondansetron (ZOFRAN) 4 MG tablet Take 1 tablet (4 mg total) by mouth every 8 (eight) hours as needed for nausea or vomiting.   No facility-administered encounter medications on file as of 08/02/2022.   Physical Exam: Blood pressure 106/62, pulse 80, temperature 98.3 F (36.8 C), temperature source Oral, height 5' (1.524 m), weight 142 lb 9.6 oz (64.7 kg), SpO2 96 %. Gen:      No acute distress HEENT:  EOMI, sclera anicteric Neck:     No masses; no thyromegaly Lungs:    Clear to  auscultation bilaterally; normal respiratory effort CV:         Regular rate and rhythm; no murmurs Abd:      + bowel sounds; soft, non-tender; no palpable masses, no distension Ext:    No edema; adequate peripheral perfusion Skin:      Warm and dry; no rash Neuro: alert and oriented x 3 Psych: normal mood and affect   Data Reviewed: Imaging: Chest x-ray 07/24/2018- no active cardiopulmonary disease. CT abdomen pelvis 03/01/2019- no acute abnormality in lower chest. High-resolution CT 07/08/2019- no interstitial lung disease.  Minimal air trapping.  Aortic atherosclerosis.   CT chest 05/17/2020-no acute lung findings.  Tiny lesion in left kidney. High-resolution CT 04/12/2022-mild bland scarring at the bases.  No interstitial lung disease. I have reviewed the images personally.  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  Assessment:  Evaluation for blood in sputum Unclear if this is actually hemoptysis.  She has been worked up for hematuria with anti-PR-3 antibody. Kidney biopsy with no evidence of vasculitis.  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 There is also question of GERD with esophageal stricture as she is seeing a gastroenterologist for dilatation.  CT reviewed with no obvious source of hemoptysis and there are no airway abnormalities on bronchoscopy.  Sinus congestion, allergies She may have reactive airway disease given intermittent wheezing Start Symbicort inhaler Singulair was recommended at last clinic  visit but she wants to avoid due to side effects I advised her to take over-the-counter Zyrtec or Claritin  Plan/Recommendations: Start Symbicort inhaler.  Marshell Garfinkel MD [facility] 08/02/2022, 10:39 AM  CC: [name], MD

## 2022-08-02 NOTE — Patient Instructions (Signed)
We will start you on an inhaler called Symbicort 160 for treatment of shortness of breath and wheezing Follow-up in 6 months

## 2022-08-03 ENCOUNTER — Telehealth: Payer: Self-pay | Admitting: Pulmonary Disease

## 2022-08-03 NOTE — Telephone Encounter (Signed)
Called and spoke with patient.  Patient stated she was prescribed Symbicort, but can not take it because of steroids.  Patient stated steroids effects her angina and kidneys. Patient is requesting a nonsteroidal inhaler.  Message routed to Dr.Mannam to advise on inhaler

## 2022-08-09 ENCOUNTER — Ambulatory Visit (HOSPITAL_BASED_OUTPATIENT_CLINIC_OR_DEPARTMENT_OTHER): Payer: Medicaid Other | Admitting: Physical Therapy

## 2022-08-13 NOTE — Telephone Encounter (Signed)
Dr. Mannam, can you please advise? Thanks!  

## 2022-08-13 NOTE — Telephone Encounter (Signed)
I called and discussed with patient.  She is still concerned about steroid side effects even though I told her that the impact from inhaled corticosteroid is low  Told her that she can stop using the Symbicort and just use albuterol as needed.  Nothing further needed.

## 2022-08-29 ENCOUNTER — Encounter: Payer: Self-pay | Admitting: Specialist

## 2022-08-29 ENCOUNTER — Ambulatory Visit (INDEPENDENT_AMBULATORY_CARE_PROVIDER_SITE_OTHER): Payer: Medicaid Other | Admitting: Specialist

## 2022-08-29 VITALS — BP 111/76 | HR 73 | Ht 59.0 in | Wt 142.0 lb

## 2022-08-29 DIAGNOSIS — M51369 Other intervertebral disc degeneration, lumbar region without mention of lumbar back pain or lower extremity pain: Secondary | ICD-10-CM

## 2022-08-29 DIAGNOSIS — M25552 Pain in left hip: Secondary | ICD-10-CM | POA: Diagnosis not present

## 2022-08-29 DIAGNOSIS — M5136 Other intervertebral disc degeneration, lumbar region: Secondary | ICD-10-CM

## 2022-08-29 NOTE — Progress Notes (Signed)
Office Visit Note   Patient: Virginia Shaw           Date of Birth: 02/13/1966           MRN: [id number] Visit Date: 08/29/2022              Requested by: [name], [place],  [place] [address] PCP: [name], MD   Assessment & Plan: Visit Diagnoses:  1. Degenerative disc disease, lumbar   2. Pain of left hip     Plan: Avoid frequent bending and stooping  No lifting greater than 10 lbs. May use ice or moist heat for pain. Weight loss is of benefit. Best medication for lumbar disc disease is arthritis medications like motrin, celebrex and naprosyn. Exercise is important to improve your indurance and does allow people to function better inspite of back pain.  I recommend a left hip block without steroid or contrast to discern if your left hip pain is intrinsic to the hip. If Pain is relieved with block then further evaluation of the hip is indicated and she should be seen by Dr. Ninfa Linden for assessment of the left hip. Dr. Rolena Infante performs the ultrasound guided left hip block. Follow up of the lumbar DDD with Dr. Laurance Flatten in 3 months.   Follow-Up Instructions: Return in about 3 weeks (around 09/19/2022) for Dr. Ninfa Linden follow up left hip pain after injection in 3 wks, See Dr. Laurance Flatten in 3 mo for lumbar DDD .   Orders:  No orders of the defined types were placed in this encounter.  No orders of the defined types were placed in this encounter.     Procedures: No procedures performed   Clinical Data: Findings:  Narrative & Impression CLINICAL DATA:  Low back pain radiating to left lower extremity for 2 years. History of surgery most recently in 2012   EXAM: MRI LUMBAR SPINE WITHOUT CONTRAST   TECHNIQUE: Multiplanar, multisequence MR imaging of the lumbar spine was performed. No intravenous contrast was administered.   COMPARISON:  Lumbar spine MRI 08/10/2019, CT abdomen/pelvis 05/18/2019   FINDINGS: Segmentation:  Standard; the lowest formed disc space is designated L5-S1.   Alignment: There is trace retrolisthesis of L4 on L5, unchanged. Alignment is otherwise normal.   Vertebrae: Vertebral body heights are preserved. Marrow signal is normal. There is no suspicious marrow signal abnormality or marrow edema. Postsurgical changes reflecting left laminectomy at L4-L5 are noted.   Conus medullaris and cauda equina: Conus extends to the T12-L1 level. Conus and cauda equina appear normal.   Paraspinal and other soft tissues: A subcentimeter T2 hyperintense lesion in the left kidney lower pole likely reflects a small cyst, for which no specific imaging follow-up is required.   Disc levels:   T12-L1: No significant spinal canal or neural foraminal stenosis.   L1-L2: No significant spinal canal or neural foraminal stenosis   L2-L3: No significant spinal canal or neural foraminal stenosis   L3-L4: No significant spinal canal or neural foraminal stenosis   L4-L5: Status post left laminectomy. There is disc desiccation and narrowing with a mild bulge and mild bilateral facet arthropathy resulting in mild left and no significant right neural foraminal stenosis and no significant spinal canal stenosis. There is no evidence of nerve root impingement in the subarticular zones.   L5-S1: There is disc desiccation and narrowing with a mild bulge, degenerative endplate spurring, and mild bilateral facet arthropathy resulting in mild bilateral neural foraminal stenosis without  significant spinal canal stenosis   The above findings are overall not significantly changed since 2020.   IMPRESSION: 1. Unchanged disc degeneration and mild facet arthropathy at L4-L5 and L5-S1 resulting in mild neural foraminal stenosis in the left at L4-L5 and bilaterally at L5-S1. No evidence of high-grade spinal canal or neural foraminal stenosis or nerve root impingement. 2. Overall, no significant interval change since  08/10/2019.     Electronically Signed   By: Valetta Mole M.D.   On: 05/01/2022 15:08        Subjective: Chief Complaint  Patient presents with   Lower Back - Pain, Follow-up    56 year old female with history of low back pain and pain thatn is in the left hip and groin area. She pain with bending twisting and stooping. Has bladder difficulty. And has seen Dr. [name], unfortunately not able to consider some of the testing that he had recommended and has not had a follow up. She also has seen her OB/GYN physician and was told that she needs to follow up with Dr. Louanne Skye.    Review of Systems  Constitutional: Negative.   HENT: Negative.    Eyes: Negative.   Respiratory: Negative.    Cardiovascular: Negative.   Gastrointestinal: Negative.   Endocrine: Negative.   Genitourinary: Negative.   Musculoskeletal: Negative.   Skin: Negative.   Allergic/Immunologic: Negative.   Neurological: Negative.   Hematological: Negative.   Psychiatric/Behavioral: Negative.       Objective: Vital Signs: BP 111/76 (BP Location: Left Arm, Patient Position: Sitting, Cuff Size: Normal)   Pulse 73   Ht '4\' 11"'$  (1.499 m)   Wt 142 lb (64.4 kg)   BMI 28.68 kg/m   Physical Exam Constitutional:      Appearance: She is well-developed.  HENT:     Head: Normocephalic and atraumatic.  Eyes:     Pupils: Pupils are equal, round, and reactive to light.  Pulmonary:     Effort: Pulmonary effort is normal.     Breath sounds: Normal breath sounds.  Abdominal:     General: Bowel sounds are normal.     Palpations: Abdomen is soft.  Musculoskeletal:        General: Normal range of motion.     Cervical back: Normal range of motion and neck supple.  Skin:    General: Skin is warm and dry.  Neurological:     Mental Status: She is alert and oriented to person, place, and time.  Psychiatric:        Behavior: Behavior normal.        Thought Content: Thought content normal.        Judgment: Judgment  normal.     Ortho Exam  Specialty Comments:  No specialty comments available.  Imaging: No results found.   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[place]) 01/11/2014   Chronic abdominal pain 01/11/2014   Chronic venous insufficiency 10/30/2013   Varicose veins 10/30/2013   Chest tightness 03/25/2012   PITUITARY ADENOMA, 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   RECTAL BLEEDING 08/26/2008   UTI'S, RECURRENT 08/26/2008   Past Medical History:  Diagnosis Date  Abnormal involuntary movements(781.0)    Allergic rhinitis, cause unspecified    Anxiety state, unspecified    Cervical spondylosis without myelopathy    Chronic headaches    Disturbance of skin sensation    Fatty liver    per patient, "not sure but does remember being told"   GERD (gastroesophageal reflux disease)    History of chicken pox    Irritable bowel syndrome    Low blood pressure    Lumbago    Myalgia and myositis, unspecified    Osteoporosis    Other and unspecified angina pectoris    Other malaise and fatigue    Peripheral vascular disease ([place])    per patient "diagnosed with it about 13-14 years ago and does not currently see someone for it, saw Dr. 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   Shortness of breath on exertion    per patient, "sometimes depends on distance"   Sinus arrhythmia    per patient   Somatization disorder    Spasm of muscle    Systemic sclerosis ([place])    TIA (transient ischemic attack)    Unspecified hereditary and idiopathic peripheral  neuropathy     Family History  Problem Relation Age of Onset   Hypertension Father    Hyperlipidemia Father    Diabetes Father    Stroke Father    Cancer Maternal Uncle        lung   Cancer Paternal Aunt        lung   Migraines Mother    Heart attack Paternal Grandmother    Heart attack Paternal Grandfather    Hypertension Paternal Grandfather    Migraines Sister     Past Surgical History:  Procedure Laterality Date   ABDOMINAL HYSTERECTOMY  1997   arnold chiari malformation  2013   BACK SURGERY  2001, 2011,2013   Lumbar x 2   BALLOON DILATION N/A 12/22/2019   Procedure: BALLOON DILATION;  Surgeon: Wilford Corner, MD;  Location: [facility];  Service: Endoscopy;  Laterality: N/A;   barium swallow test  10/2019   BLADDER SURGERY  2011   CARDIAC CATHETERIZATION     CARPAL TUNNEL RELEASE Left    CATARACT EXTRACTION     CERVICAL SPINE SURGERY  2013   CHOLECYSTECTOMY     COLONOSCOPY  2009   "Normal" Dr Sharlett Iles   COLONOSCOPY  2002   "Normal" Dr Freddrick March   COLONOSCOPY  ~2013   Dr Ferdinand Lango.  "Chest pain with the prep" = aborted   COLONOSCOPY WITH PROPOFOL N/A 03/29/2020   Procedure: COLONOSCOPY WITH PROPOFOL;  Surgeon: Wilford Corner, MD;  Location: [facility];  Service: Endoscopy;  Laterality: N/A;   CRANIECTOMY SUBOCCIPITAL W/ CERVICAL LAMINECTOMY / CHIARI  2012   Dr Annette Stable, Chiari decompression with suboccipital craniectomy and   CYSTOCELE REPAIR  2002   Dr Gaetano Net   ESOPHAGOGASTRODUODENOSCOPY (EGD) WITH PROPOFOL N/A 12/22/2019   Procedure: ESOPHAGOGASTRODUODENOSCOPY (EGD) WITH PROPOFOL;  Surgeon: Wilford Corner, MD;  Location: [facility];  Service: Endoscopy;  Laterality: N/A;   INSERTION OF MESH Bilateral 10/04/2020   Procedure: INSERTION OF MESH;  Surgeon: Ralene Ok, MD;  Location: [facility];  Service: General;  Laterality: Bilateral;   [place], 2000   PITUITARY EXCISION  2010   pituitary cyst removed   RECTOCELE REPAIR  2002   Dr.  Gaetano Net   RENAL BIOPSY  08/04/2019   tear duct tubes  2005  TUBAL LIGATION     VIDEO BRONCHOSCOPY N/A 05/14/2022   Procedure: VIDEO BRONCHOSCOPY WITHOUT FLUORO;  Surgeon: Marshell Garfinkel, MD;  Location: [facility];  Service: Cardiopulmonary;  Laterality: N/A;   XI ROBOTIC ASSISTED INGUINAL HERNIA REPAIR WITH MESH Bilateral 10/04/2020   Procedure: XI ROBOTIC ASSISTED BILATERAL INGUINAL HERNIA REPAIR WITH MESH;  Surgeon: Ralene Ok, MD;  Location: [facility];  Service: General;  Laterality: Bilateral;   Social History   Occupational History    Employer: UNEMPLOYED  Tobacco Use   Smoking status: Never    Passive exposure: Past   Smokeless tobacco: Never  Vaping Use   Vaping Use: Never used  Substance and Sexual Activity   Alcohol use: No   Drug use: No   Sexual activity: Never    Birth control/protection: Abstinence

## 2022-08-29 NOTE — Patient Instructions (Signed)
Avoid frequent bending and stooping  No lifting greater than 10 lbs. May use ice or moist heat for pain. Weight loss is of benefit. Best medication for lumbar disc disease is arthritis medications like motrin, celebrex and naprosyn. Exercise is important to improve your indurance and does allow people to function better inspite of back pain.  I recommend a left hip block without steroid or contrast to discern if your left hip pain is intrinsic to the hip. If Pain is relieved with block then further evaluation of the hip is indicated and she should be seen by Dr. Ninfa Linden for assessment of the left hip. Dr. Rolena Infante performs the ultrasound guided left hip block. Follow up of the lumbar DDD with Dr. Laurance Flatten in 3 months.

## 2022-09-11 DIAGNOSIS — I639 Cerebral infarction, unspecified: Secondary | ICD-10-CM

## 2022-09-11 HISTORY — DX: Cerebral infarction, unspecified: I63.9

## 2022-09-19 ENCOUNTER — Ambulatory Visit: Payer: Medicaid Other | Admitting: Orthopaedic Surgery

## 2022-10-12 ENCOUNTER — Ambulatory Visit: Payer: Medicaid Other | Admitting: Rheumatology

## 2022-11-19 ENCOUNTER — Ambulatory Visit: Payer: Medicaid Other | Admitting: Sports Medicine

## 2022-11-29 ENCOUNTER — Ambulatory Visit (INDEPENDENT_AMBULATORY_CARE_PROVIDER_SITE_OTHER): Payer: Medicaid Other | Admitting: Orthopedic Surgery

## 2022-11-29 ENCOUNTER — Ambulatory Visit (INDEPENDENT_AMBULATORY_CARE_PROVIDER_SITE_OTHER): Payer: Medicaid Other

## 2022-11-29 VITALS — BP 110/74 | HR 81 | Ht 59.0 in | Wt 142.0 lb

## 2022-11-29 DIAGNOSIS — M5136 Other intervertebral disc degeneration, lumbar region: Secondary | ICD-10-CM

## 2022-11-29 DIAGNOSIS — M51369 Other intervertebral disc degeneration, lumbar region without mention of lumbar back pain or lower extremity pain: Secondary | ICD-10-CM

## 2022-11-29 NOTE — Progress Notes (Signed)
Orthopedic Spine Surgery Office Note  Assessment: Patient is a 57 y.o. female with chronic low back pain with acute worsening.  Also has radicular leg pain that seems to be in an L5 distribution.   Plan: -Explained that initially conservative treatment is tried as a significant number of patients may experience relief with these treatment modalities. Discussed that the conservative treatments include:  -activity modification  -physical therapy  -over the counter pain medications  -medrol dosepak  -lumbar steroid injections -Patient has tried children's Tylenol and physical therapy -Since she has multiple allergies and cannot do most of the medication that I normally prescribe and cannot tolerate a steroid injection for diagnostic purposes, recommended pain management.  A referral was provided to her today -If her pain is not well-controlled with pain management and we consider surgery, would want a EMG/NCS for diagnostic purposes since she cannot do diagnostic steroid injection in her stenosis is only mild on the left at L4/5 and L5/S1 -Patient should return to office on an as-needed basis   Patient expressed understanding of the plan and all questions were answered to the patient's satisfaction.   ___________________________________________________________________________   History:  Patient is a 57 y.o. female who presents today for lumbar spine. Patient has had several years of low back pain. It goes back to at least the early 2000s which is when she underwent her first lumbar spine surgery with [facility]. She said she had two more surgeries after that index surgery.  She states that she did get better after the surgeries but did not get complete relief of her pain.  She has had that same amount of pain for several years now.  Pain in the last year has gotten notably worse.  She feels it mostly in her lower back but she does have some pain that radiates down the left leg.  She feels it goes  down the lateral aspect of her thigh into the anterior lateral aspect of the leg.  It does not radiate into the foot.  She has no symptoms on the right side.  There is no recent trauma or injury that brought on this worsening of the pain.  She has tried physical therapy and children's Tylenol.  She has 35 allergies and does not have many medications that are options and she cannot do steroid injections.   Weakness: yes, lower back feels weak Symptoms of imbalance: denies Paresthesias and numbness: denies Bowel or bladder incontinence: yes, chronic with no recent changes Saddle anesthesia: denies  Treatments tried: Physical therapy, Children's Tylenol  Review of systems: Denies fevers and chills, night sweats, unexplained weight loss, history of cancer, pain that wakes them at night  Past medical history: Hyperlipidemia Coronary artery disease GERD Irritable bowel syndrome Fibromyalgia Osteoporosis History of TIA Chronic kidney disease Chronic pain Osteoarthritis Angina  Allergies (35): Several, see chart.  If surgery ever considered, has allergy to steroid, penicillin, Tylenol, clindamycin, NSAIDs, cephalosporins, tricyclic antidepressants, nitrofurantoin  Past surgical history:  Lumbar surgery x 3 Bilateral eye surgery Sinus surgery Chiari malformation surgery Pituitary cyst removal Cholecystectomy Anterior cervical discectomy and fusion Hernia repair Kidney biopsy Angioplasty Hysterectomy Carpal tunnel release Cystocele repair Rectocele repair Tubal ligation  Social history: Denies use of nicotine product (smoking, vaping, patches, smokeless) Alcohol use: denies Denies recreational drug use   Physical Exam:  General: no acute distress, appears stated age Neurologic: alert, answering questions appropriately, following commands Respiratory: unlabored breathing on room air, symmetric chest rise Psychiatric: appropriate affect, normal cadence to speech  MSK  (spine):  -Strength exam      Left  Right EHL    5/5  4/5 TA    5/5  5/5 GSC    5/5  5/5 Knee extension  5/5  5/5 Hip flexion   5/5  5/5  -Sensory exam    Sensation intact to light touch in L3-S1 nerve distributions of bilateral lower extremities  -Achilles DTR: 1/4 on the left, 1/4 on the right -Patellar tendon DTR: 1/4 on the left, 1/4 on the right  -Straight leg raise: negative -Contralateral straight leg raise: negative -Femoral nerve stretch test: negative bilaterally -Clonus: no beats bilaterally  -Left hip exam: Positive FADIR, positive FABER, positive SI joint compression test, pain with internal and external rotation through the hip, no pain through mid range of motion at the hip, no tenderness palpation over the hip or SI joint -Right hip exam: Positive FADIR, positive FABER, positive SI joint compression test, no pain with external rotation at the hip, tender to palpation over the SI joint-winced in pain and moaned  Imaging: XR of the lumbar spine from 11/29/2022 was independently reviewed and interpreted, showing disc height loss at L4/5 and L5/S1.  No fracture or dislocation.  No evidence of instability on flexion/extension views.  MRI of the lumbar spine from 05/01/2022 was independently reviewed and interpreted, showing DDD at L4/5 and L5/S1. No significant central or lateral recess stenosis. Mild L4/5 and L5/S1 foraminal stenosis on the left. Hemilaminotomy defects at L4/5 and L5/S1.    Patient name: Virginia Shaw Patient MRN: [id number] Date of visit: 11/29/22

## 2022-12-10 ENCOUNTER — Ambulatory Visit: Payer: Medicaid Other | Admitting: Orthopaedic Surgery

## 2022-12-12 NOTE — Progress Notes (Signed)
Office Visit Note  Patient: Virginia Shaw             Date of Birth: [birth year]           MRN: [id number]             PCP: [name], MD Referring: [name], MD Visit Date: 12/26/2022 Occupation: @GUAROCC$ @  Subjective:  Pending neck and both hands   History of Present Illness: Virginia Shaw is a 57 y.o. female with history of osteoarthritis, degenerative disc disease, fibromyalgia and chronic pain syndrome.  She states she continues to have discomfort in her entire spine.  She also has discomfort in her bilateral hands from carpal tunnel syndrome.  She had left carpal tunnel release in the past which continues to hurt.  She also gives history of intermittent triggering of her left middle finger.  She has been having discomfort in the left trochanteric bursa which causes nocturnal pain.  He continues to have generalized pain and discomfort from fibromyalgia syndrome.  Patient states she has an appointment coming up with the pain management in [place].  She has an appointment with the physical therapist on 6 March.    Activities of Daily Living:  Patient reports morning stiffness for 5-10 minutes.   Patient Reports nocturnal pain.  Difficulty dressing/grooming: Denies Difficulty climbing stairs: Reports Difficulty getting out of chair: Denies Difficulty using hands for taps, buttons, cutlery, and/or writing: Reports  Review of Systems  Constitutional:  Positive for fatigue.  HENT: Negative.  Negative for mouth sores and mouth dryness.   Eyes:  Positive for dryness.  Respiratory:  Negative for difficulty breathing.   Cardiovascular:  Positive for palpitations.  Gastrointestinal: Negative.  Negative for blood in stool, constipation and diarrhea.  Endocrine: Negative.  Negative for increased urination.  Genitourinary: Negative.  Negative for involuntary urination.  Musculoskeletal:  Positive for joint pain, joint pain, joint swelling, muscle weakness and  morning stiffness. Negative for gait problem, myalgias, muscle tenderness and myalgias.  Skin:  Negative for color change, rash and sensitivity to sunlight.  Allergic/Immunologic: Negative.  Negative for susceptible to infections.  Neurological:  Positive for dizziness and headaches.  Hematological: Negative.  Negative for swollen glands.  Psychiatric/Behavioral:  Positive for sleep disturbance. Negative for depressed mood. The patient is not nervous/anxious.     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[place]) 01/11/2014   Chronic abdominal pain 01/11/2014   Chronic venous insufficiency 10/30/2013   Varicose veins 10/30/2013   Chest tightness 03/25/2012   PITUITARY ADENOMA, 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   RECTAL BLEEDING 08/26/2008   UTI'S, RECURRENT 08/26/2008    Past Medical History:  Diagnosis Date   Abnormal involuntary movements(781.0)    Allergic rhinitis, cause unspecified    Anxiety state, unspecified    Cervical spondylosis without myelopathy    Chronic headaches    Disturbance of skin sensation    Fatty liver    per patient, "not sure but does remember being told"   GERD (gastroesophageal reflux disease)    History of chicken pox    Irritable bowel syndrome    Low blood pressure    Lumbago  Myalgia and myositis, unspecified    Osteoporosis    Other and unspecified angina pectoris    Other malaise and fatigue    Peripheral vascular disease (HCC)    per patient "diagnosed with it about 13-14 years ago and does not currently see someone for it, saw Dr. 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   Shortness of breath on exertion    per patient, "sometimes depends on distance"   Sinus arrhythmia    per patient   Somatization disorder    Spasm of muscle    Stroke ([place]) 09/11/2022   Systemic sclerosis ([place])    TIA (transient ischemic attack)    Unspecified hereditary and idiopathic peripheral neuropathy     Family History  Problem Relation Age of Onset   Hypertension Father    Hyperlipidemia Father    Diabetes Father    Stroke Father    Cancer Maternal Uncle        lung   Cancer Paternal Aunt        lung   Migraines Mother    Heart attack Paternal Grandmother    Heart attack Paternal Grandfather    Hypertension Paternal Grandfather    Migraines Sister    Past Surgical History:  Procedure Laterality Date   ABDOMINAL HYSTERECTOMY  1997   arnold chiari malformation  2013   BACK SURGERY  2001, 2011,2013   Lumbar x 2   BALLOON DILATION N/A 12/22/2019   Procedure: BALLOON DILATION;  Surgeon: Wilford Corner, MD;  Location: [facility];  Service: Endoscopy;  Laterality: N/A;   barium swallow test  10/2019   BLADDER SURGERY  2011   CARDIAC CATHETERIZATION     CARPAL TUNNEL RELEASE Left    CATARACT EXTRACTION     CERVICAL SPINE SURGERY  2013   CHOLECYSTECTOMY     COLONOSCOPY  2009   "Normal" Dr Sharlett Iles   COLONOSCOPY  2002   "Normal" Dr Freddrick March   COLONOSCOPY  ~2013   Dr Ferdinand Lango.  "Chest pain with the prep" = aborted   COLONOSCOPY WITH PROPOFOL N/A 03/29/2020   Procedure: COLONOSCOPY WITH PROPOFOL;  Surgeon: Wilford Corner, MD;  Location: [facility];  Service: Endoscopy;  Laterality: N/A;   CRANIECTOMY SUBOCCIPITAL W/ CERVICAL LAMINECTOMY / CHIARI  2012   Dr Annette Stable, Chiari decompression with suboccipital craniectomy and   CYSTOCELE REPAIR  2002   Dr Gaetano Net   ESOPHAGOGASTRODUODENOSCOPY (EGD) WITH PROPOFOL N/A 12/22/2019   Procedure: ESOPHAGOGASTRODUODENOSCOPY (EGD) WITH PROPOFOL;   Surgeon: Wilford Corner, MD;  Location: [facility];  Service: Endoscopy;  Laterality: N/A;   INSERTION OF MESH Bilateral 10/04/2020   Procedure: INSERTION OF MESH;  Surgeon: Ralene Ok, MD;  Location: [facility];  Service: General;  Laterality: Bilateral;   [place], [place]   PITUITARY EXCISION  2010   pituitary cyst removed   RECTOCELE REPAIR  2002   Dr. Gaetano Net   RENAL BIOPSY  08/04/2019   tear duct tubes  2005   TUBAL LIGATION     VIDEO BRONCHOSCOPY N/A 05/14/2022   Procedure: VIDEO BRONCHOSCOPY WITHOUT FLUORO;  Surgeon: Marshell Garfinkel, MD;  [facility]: [facility];  Service: Cardiopulmonary;  Laterality: N/A;   XI ROBOTIC ASSISTED INGUINAL HERNIA REPAIR WITH MESH Bilateral 10/04/2020   Procedure: XI ROBOTIC ASSISTED BILATERAL INGUINAL HERNIA REPAIR WITH MESH;  Surgeon: Ralene Ok, MD;  Location: [facility];  Service: General;  Laterality: Bilateral;   Social History   Social  History Narrative   Lives at home with children. ( 2 daughters)   Regular exercise-yes   Caffeine Use-no   GED   Not Working outside home   Immunization History  Administered Date(s) Administered   Tdap 10/08/2018     Objective: Vital Signs: BP 104/69 (BP Location: Left Arm, Patient Position: Sitting, Cuff Size: Normal)   Pulse 81   Resp 14   Ht 4' 11"$  (1.499 m)   Wt 144 lb 12.8 oz (65.7 kg)   BMI 29.25 kg/m    Physical Exam Vitals and nursing note reviewed.  Constitutional:      Appearance: She is well-developed.  HENT:     Head: Normocephalic and atraumatic.  Eyes:     Conjunctiva/sclera: Conjunctivae normal.  Cardiovascular:     Rate and Rhythm: Normal rate and regular rhythm.     Heart sounds: Normal heart sounds.  Pulmonary:     Effort: Pulmonary effort is normal.     Breath sounds: Normal breath sounds.  Abdominal:     General: Bowel sounds are normal.     Palpations: Abdomen is soft.  Musculoskeletal:     Cervical back: Normal range of motion.  Lymphadenopathy:      Cervical: No cervical adenopathy.  Skin:    General: Skin is warm and dry.     Capillary Refill: Capillary refill takes less than 2 seconds.  Neurological:     Mental Status: She is alert and oriented to person, place, and time.  Psychiatric:        Behavior: Behavior normal.      Musculoskeletal Exam: She had limited painful range of motion of the cervical, thoracic and lumbar spine.  Thoracic kyphosis was noted.  She had bilateral trapezius spasm.  Shoulders, elbows, wrists were in good range of motion without any synovitis.  MCPs PIPs and DIPs were in good range of motion with no synovitis.  Hip joints were in good range of motion.  She had tenderness over left trochanteric bursa.  Knee joints were in good range of motion without any warmth swelling or effusion.  There was no tenderness over ankles or MTPs.  CDAI Exam: CDAI Score: -- Patient Global: --; Provider Global: -- Swollen: --; Tender: -- Joint Exam 12/26/2022   No joint exam has been documented for this visit   There is currently no information documented on the homunculus. Go to the Rheumatology activity and complete the homunculus joint exam.  Investigation: No additional findings.  Imaging: No results found.  Recent Labs: Lab Results  Component Value Date   WBC 7.3 01/01/2022   HGB 13.7 01/01/2022   PLT 391 01/01/2022   NA 138 01/01/2022   K 3.9 01/01/2022   CL 103 01/01/2022   CO2 28 01/01/2022   GLUCOSE 95 01/01/2022   BUN 12 01/01/2022   CREATININE 0.58 01/01/2022   BILITOT 0.4 01/01/2022   ALKPHOS 61 01/01/2022   AST 22 01/01/2022   ALT 23 01/01/2022   PROT 7.5 01/01/2022   ALBUMIN 4.1 01/01/2022   CALCIUM 8.9 01/01/2022   GFRAA >60 04/21/2020    Speciality Comments: No specialty comments available.  Procedures:  No procedures performed Allergies: Iodinated contrast media, Penicillins, Prednisone, Propoxyphene, Acetaminophen, Benzonatate, Buprenorphine hcl, Citric acid, Clindamycin,  Codeine, Diazepam, Diclofenac, Esomeprazole, Morphine and related, Nsaids, Pantoprazole sodium, Albuterol and levalbuterol, Ceftin [cefuroxime axetil], Cephalexin, Cortisone, Diltiazem, Doxycycline, Hydrocodone-acetaminophen, Ibuprofen, Loracarbef, Macrobid [nitrofurantoin monohyd macro], Maxalt [rizatriptan], Methylprednisolone sodium succ, Naproxen, Omeprazole, Oxycodone-acetaminophen, Phenazopyridine hcl, Tomato, Budesonide, and Lidocaine  Assessment / Plan:     Visit Diagnoses: Polyarthralgia - RF negative, anti-CCP negative, 14 3 3 $ eta positive, positive mild synovitis on ultrasound examination.  Her bilateral median nerves are within normal limits: -She continues to have pain and discomfort in her bilateral hands.  She complains of bilateral carpal tunnel syndrome symptoms.  She continues to have pain and discomfort in her entire spine.  No synovitis was noted.  She had bilateral PIP and DIP thickening which is causing discomfort.  A handout on hand exercises was given.  Patient wanted to repeat labs for rheumatoid arthritis. Plan: Rheumatoid factor, Sedimentation rate, Cyclic citrul peptide antibody, IgG  Primary osteoarthritis of both hands -she continues to have pain and discomfort in the bilateral hands.  Clinical and radiographic findings are consistent with osteoarthritis.  No synovitis was noted on the examination.  Joint protection of all strengthening was discussed.  Trigger finger, left middle finger-I offered cortisone injection.  Patient states she cannot have cortisone injection as she gets palpitations and other side effects.  Bilateral carpal tunnel syndrome - Nerve conduction study performed at [facility].TCTR by Dr. Erlinda Hong 2022.  She did not have surgery on the right hand.  She continues to have pain and discomfort in her bilateral hands.  Patient states she had no relief from carpal tunnel release surgery.  Trochanteric bursitis, left hip-IT band stretches were demonstrated and discussed.   Water aerobics and swimming was advised.  Patient will be starting physical therapy soon.    DDD (degenerative disc disease), cervical - she is followed by Dr. Trenton Gammon.  A handout on neck exercises was given.  She had bilateral trapezius spasm.  She cannot have trapezius injections due to sensitivity to cortisone injections.  Pain in thoracic spine-she complains of chronic pain.  She has thoracic kyphosis.  Stretching exercises were emphasized.  DDD (degenerative disc disease), lumbar - Followed by Dr. Laurance Flatten.  She recently saw Dr. Laurance Flatten who advised conservative therapy.  I reviewed the office visit note from November 29, 2022.  Fibromyalgia-she continues to have generalized pain, hyperalgesia and positive tender points.  Provide aerobics and summing was emphasized.  Chronic pain syndrome-patient has an appointment coming up with pain management next month.  Other medical problems are listed as follows:  Coronary artery spasm (HCC)  History of gastroesophageal reflux (GERD)  History of IBS  History of varicose veins  Pituitary adenoma ([place])  Orders: Orders Placed This Encounter  Procedures   Rheumatoid factor   Sedimentation rate   Cyclic citrul peptide antibody, IgG   No orders of the defined types were placed in this encounter.    Follow-Up Instructions: Return in about 6 months (around 06/26/2023) for Osteoarthritis.   Bo Merino, MD  Note - This record has been created using Editor, commissioning.  Chart creation errors have been sought, but may not always  have been located. Such creation errors do not reflect on  the standard of medical care.

## 2022-12-26 ENCOUNTER — Ambulatory Visit: Payer: Medicaid Other | Attending: Physician Assistant | Admitting: Rheumatology

## 2022-12-26 ENCOUNTER — Encounter: Payer: Self-pay | Admitting: Rheumatology

## 2022-12-26 VITALS — BP 104/69 | HR 81 | Resp 14 | Ht 59.0 in | Wt 144.8 lb

## 2022-12-26 DIAGNOSIS — M546 Pain in thoracic spine: Secondary | ICD-10-CM

## 2022-12-26 DIAGNOSIS — G5603 Carpal tunnel syndrome, bilateral upper limbs: Secondary | ICD-10-CM

## 2022-12-26 DIAGNOSIS — M5030 Other cervical disc degeneration, unspecified cervical region: Secondary | ICD-10-CM

## 2022-12-26 DIAGNOSIS — M5136 Other intervertebral disc degeneration, lumbar region: Secondary | ICD-10-CM

## 2022-12-26 DIAGNOSIS — I201 Angina pectoris with documented spasm: Secondary | ICD-10-CM

## 2022-12-26 DIAGNOSIS — M65332 Trigger finger, left middle finger: Secondary | ICD-10-CM

## 2022-12-26 DIAGNOSIS — I872 Venous insufficiency (chronic) (peripheral): Secondary | ICD-10-CM

## 2022-12-26 DIAGNOSIS — G894 Chronic pain syndrome: Secondary | ICD-10-CM

## 2022-12-26 DIAGNOSIS — D352 Benign neoplasm of pituitary gland: Secondary | ICD-10-CM

## 2022-12-26 DIAGNOSIS — Z8679 Personal history of other diseases of the circulatory system: Secondary | ICD-10-CM

## 2022-12-26 DIAGNOSIS — M19041 Primary osteoarthritis, right hand: Secondary | ICD-10-CM

## 2022-12-26 DIAGNOSIS — M2550 Pain in unspecified joint: Secondary | ICD-10-CM | POA: Diagnosis not present

## 2022-12-26 DIAGNOSIS — Z8719 Personal history of other diseases of the digestive system: Secondary | ICD-10-CM

## 2022-12-26 DIAGNOSIS — M797 Fibromyalgia: Secondary | ICD-10-CM

## 2022-12-26 DIAGNOSIS — M7062 Trochanteric bursitis, left hip: Secondary | ICD-10-CM

## 2022-12-26 DIAGNOSIS — M19042 Primary osteoarthritis, left hand: Secondary | ICD-10-CM

## 2022-12-26 NOTE — Patient Instructions (Signed)
Cervical Strain and Sprain Rehab Ask your health care provider which exercises are safe for you. Do exercises exactly as told by your health care provider and adjust them as directed. It is normal to feel mild stretching, pulling, tightness, or discomfort as you do these exercises. Stop right away if you feel sudden pain or your pain gets worse. Do not begin these exercises until told by your health care provider. Stretching and range-of-motion exercises Cervical side bending  Using good posture, sit on a stable chair or stand up. Without moving your shoulders, slowly tilt your left / right ear to your shoulder until you feel a stretch in the neck muscles on the opposite side. You should be looking straight ahead. Hold for __________ seconds. Repeat with the other side of your neck. Repeat __________ times. Complete this exercise __________ times a day. Cervical rotation  Using good posture, sit on a stable chair or stand up. Slowly turn your head to the side as if you are looking over your left / right shoulder. Keep your eyes level with the ground. Stop when you feel a stretch along the side and the back of your neck. Hold for __________ seconds. Repeat this by turning to your other side. Repeat __________ times. Complete this exercise __________ times a day. Thoracic extension and pectoral stretch  Roll a towel or a small blanket so it is about 4 inches (10 cm) in diameter. Lie down on your back on a firm surface. Put the towel in the middle of your back across your spine. It should not be under your shoulder blades. Put your hands behind your head and let your elbows fall out to your sides. Hold for __________ seconds. Repeat __________ times. Complete this exercise __________ times a day. Strengthening exercises Upper cervical flexion  Lie on your back with a thin pillow behind your head or a small, rolled-up towel under your neck. Gently tuck your chin toward your chest and nod  your head down to look toward your feet. Do not lift your head off the pillow. Hold for __________ seconds. Release the tension slowly. Relax your neck muscles completely before you repeat this exercise. Repeat __________ times. Complete this exercise __________ times a day. Cervical extension  Stand about 6 inches (15 cm) away from a wall, with your back facing the wall. Place a soft object, about 6-8 inches (15-20 cm) in diameter, between the back of your head and the wall. A soft object could be a small pillow, a ball, or a folded towel. Gently tilt your head back and press into the soft object. Keep your jaw and forehead relaxed. Hold for __________ seconds. Release the tension slowly. Relax your neck muscles completely before you repeat this exercise. Repeat __________ times. Complete this exercise __________ times a day. Posture and body mechanics Body mechanics refer to the movements and positions of your body while you do your daily activities. Posture is part of body mechanics. Good posture and healthy body mechanics can help to relieve stress in your body's tissues and joints. Good posture means that your spine is in its natural S-curve position (your spine is neutral), your shoulders are pulled back slightly, and your head is not tipped forward. The following are general guidelines for using improved posture and body mechanics in your everyday activities. Sitting  When sitting, keep your spine neutral and keep your feet flat on the floor. Use a footrest, if needed, and keep your thighs parallel to the floor. Avoid rounding  your shoulders. Avoid tilting your head forward. When working at a desk or a computer, keep your desk at a height where your hands are slightly lower than your elbows. Slide your chair under your desk so you are close enough to maintain good posture. When working at a computer, place your monitor at a height where you are looking straight ahead and you do not have to  tilt your head forward or downward to look at the screen. Standing  When standing, keep your spine neutral and keep your feet about hip-width apart. Keep a slight bend in your knees. Your ears, shoulders, and hips should line up. When you do a task in which you stand in one place for a long time, place one foot up on a stable object that is 2-4 inches (5-10 cm) high, such as a footstool. This helps keep your spine neutral. Resting When lying down and resting, avoid positions that are most painful for you. Try to support your neck in a neutral position. You can use a contour pillow or a small rolled-up towel. Your pillow should support your neck but not push on it. This information is not intended to replace advice given to you by your health care provider. Make sure you discuss any questions you have with your health care provider. Document Revised: 05/14/2022 Document Reviewed: 05/14/2022 Elsevier Patient Education  [place]. Hand Exercises Hand exercises can be helpful for almost anyone. These exercises can strengthen the hands, improve flexibility and movement, and increase blood flow to the hands. These results can make work and daily tasks easier. Hand exercises can be especially helpful for people who have joint pain from arthritis or have nerve damage from overuse (carpal tunnel syndrome). These exercises can also help people who have injured a hand. Exercises Most of these hand exercises are gentle stretching and motion exercises. It is usually safe to do them often throughout the day. Warming up your hands before exercise may help to reduce stiffness. You can do this with gentle massage or by placing your hands in warm water for 10-15 minutes. It is normal to feel some stretching, pulling, tightness, or mild discomfort as you begin new exercises. This will gradually improve. Stop an exercise right away if you feel sudden, severe pain or your pain gets worse. Ask your health care  provider which exercises are best for you. Knuckle bend or "claw" fist  Stand or sit with your arm, hand, and all five fingers pointed straight up. Make sure to keep your wrist straight during the exercise. Gently bend your fingers down toward your palm until the tips of your fingers are touching the top of your palm. Keep your big knuckle straight and just bend the small knuckles in your fingers. Hold this position for __________ seconds. Straighten (extend) your fingers back to the starting position. Repeat this exercise 5-10 times with each hand. Full finger fist  Stand or sit with your arm, hand, and all five fingers pointed straight up. Make sure to keep your wrist straight during the exercise. Gently bend your fingers into your palm until the tips of your fingers are touching the middle of your palm. Hold this position for __________ seconds. Extend your fingers back to the starting position, stretching every joint fully. Repeat this exercise 5-10 times with each hand. Straight fist Stand or sit with your arm, hand, and all five fingers pointed straight up. Make sure to keep your wrist straight during the exercise. Gently bend your  fingers at the big knuckle, where your fingers meet your hand, and the middle knuckle. Keep the knuckle at the tips of your fingers straight and try to touch the bottom of your palm. Hold this position for __________ seconds. Extend your fingers back to the starting position, stretching every joint fully. Repeat this exercise 5-10 times with each hand. Tabletop  Stand or sit with your arm, hand, and all five fingers pointed straight up. Make sure to keep your wrist straight during the exercise. Gently bend your fingers at the big knuckle, where your fingers meet your hand, as far down as you can while keeping the small knuckles in your fingers straight. Think of forming a tabletop with your fingers. Hold this position for __________ seconds. Extend your  fingers back to the starting position, stretching every joint fully. Repeat this exercise 5-10 times with each hand. Finger spread  Place your hand flat on a table with your palm facing down. Make sure your wrist stays straight as you do this exercise. Spread your fingers and thumb apart from each other as far as you can until you feel a gentle stretch. Hold this position for __________ seconds. Bring your fingers and thumb tight together again. Hold this position for __________ seconds. Repeat this exercise 5-10 times with each hand. Making circles  Stand or sit with your arm, hand, and all five fingers pointed straight up. Make sure to keep your wrist straight during the exercise. Make a circle by touching the tip of your thumb to the tip of your index finger. Hold for __________ seconds. Then open your hand wide. Repeat this motion with your thumb and each finger on your hand. Repeat this exercise 5-10 times with each hand. Thumb motion  Sit with your forearm resting on a table and your wrist straight. Your thumb should be facing up toward the ceiling. Keep your fingers relaxed as you move your thumb. Lift your thumb up as high as you can toward the ceiling. Hold for __________ seconds. Bend your thumb across your palm as far as you can, reaching the tip of your thumb for the small finger (pinkie) side of your palm. Hold for __________ seconds. Repeat this exercise 5-10 times with each hand. Grip strengthening  Hold a stress ball or other soft ball in the middle of your hand. Slowly increase the pressure, squeezing the ball as much as you can without causing pain. Think of bringing the tips of your fingers into the middle of your palm. All of your finger joints should bend when doing this exercise. Hold your squeeze for __________ seconds, then relax. Repeat this exercise 5-10 times with each hand. Contact a health care provider if: Your hand pain or discomfort gets much worse when you  do an exercise. Your hand pain or discomfort does not improve within 2 hours after you exercise. If you have any of these problems, stop doing these exercises right away. Do not do them again unless your health care provider says that you can. Get help right away if: You develop sudden, severe hand pain or swelling. If this happens, stop doing these exercises right away. Do not do them again unless your health care provider says that you can. This information is not intended to replace advice given to you by your health care provider. Make sure you discuss any questions you have with your health care provider. Document Revised: 02/09/2021 Document Reviewed: 02/09/2021 Elsevier Patient Education  [place].

## 2022-12-28 LAB — RHEUMATOID FACTOR: Rheumatoid fact SerPl-aCnc: <14 IU/mL (ref <14)

## 2022-12-28 LAB — CYCLIC CITRUL PEPTIDE ANTIBODY, IGG: Cyclic Citrullin Peptide Ab: <16 UNITS

## 2022-12-28 LAB — SEDIMENTATION RATE: Sed Rate: 6 mm/h (ref 0–30)

## 2022-12-31 NOTE — Progress Notes (Signed)
RF and CCP Ab are negative. ESR is normal.

## 2023-01-16 ENCOUNTER — Telehealth: Payer: Self-pay | Admitting: *Deleted

## 2023-01-16 NOTE — Telephone Encounter (Signed)
   Pre-operative Risk Assessment    Patient Name: Virginia Shaw  DOB: 01-Jun-1966 MRN: [id number]      Request for Surgical Clearance    Procedure:   C4-5 ANTERIOR CERVICAL FUSION   Date of Surgery:  Clearance TBD                                 Surgeon:  DR. Earnie Larsson Surgeon's Group or Practice Name:  [facility] Phone number:  [phone] Fax number:  [phone] ATTN: Lorriane Shire EXT [id number]   Type of Clearance Requested:   - Medical ; ASA   Type of Anesthesia:  General    Additional requests/questions:    Jiles Prows   01/16/2023, 6:05 PM

## 2023-01-18 ENCOUNTER — Telehealth: Payer: Self-pay | Admitting: Nurse Practitioner

## 2023-01-18 NOTE — Telephone Encounter (Signed)
Fax received from Dr. Earnie Larsson with [facility] to perform a C4-5 Anterior Cervical Fusion on patient.  Patient needs surgery clearance. Surgery is Pending. Patient was seen on 08/02/2022. Office protocol is a risk assessment can be sent to surgeon if patient has been seen in 60 days or less.   Sending to Roxan Diesel NP for risk assessment or recommendations if patient needs to be seen in office prior to surgical procedure.    Called patient and got her set up for office visit with Katie on 01/23/2023.

## 2023-01-18 NOTE — Telephone Encounter (Signed)
Looks like pt is followed by: [facility]  [address]  [place]  [place], [place] [address]  [phone]   Pt has recent appt 12/28/22   Requesting office may need to reach out to office stated above for pre op clearance. I will update the pre op APP as well.

## 2023-01-18 NOTE — Telephone Encounter (Signed)
   Name: Adannaya Fernholz  DOB: 03/18/1966  MRN: [id number]  Primary Cardiologist: None  Chart reviewed as part of pre-operative protocol coverage. Because of Sherryann Hinze Homes's past medical history and time since last visit, she will require a follow-up in-office visit in order to better assess preoperative cardiovascular risk. Patient is technically considered a new patient.  Has not been seen since 2013.  Pre-op covering staff: - Please schedule appointment and call patient to inform them. If patient already had an upcoming appointment within acceptable timeframe, please add "pre-op clearance" to the appointment notes so provider is aware. - Please contact requesting surgeon's office via preferred method (i.e, phone, fax) to inform them of need for appointment prior to surgery.   Lenna Sciara, NP  01/18/2023, 8:47 AM

## 2023-01-23 ENCOUNTER — Ambulatory Visit: Payer: Medicaid Other | Admitting: Nurse Practitioner

## 2023-01-23 ENCOUNTER — Encounter: Payer: Self-pay | Admitting: Nurse Practitioner

## 2023-01-23 ENCOUNTER — Ambulatory Visit (INDEPENDENT_AMBULATORY_CARE_PROVIDER_SITE_OTHER): Payer: Medicaid Other

## 2023-01-23 VITALS — BP 96/60 | HR 81 | Temp 98.4°F | Ht 60.0 in | Wt 144.4 lb

## 2023-01-23 DIAGNOSIS — J45909 Unspecified asthma, uncomplicated: Secondary | ICD-10-CM | POA: Insufficient documentation

## 2023-01-23 DIAGNOSIS — R0602 Shortness of breath: Secondary | ICD-10-CM | POA: Diagnosis not present

## 2023-01-23 DIAGNOSIS — J4530 Mild persistent asthma, uncomplicated: Secondary | ICD-10-CM

## 2023-01-23 DIAGNOSIS — Z01811 Encounter for preprocedural respiratory examination: Secondary | ICD-10-CM | POA: Insufficient documentation

## 2023-01-23 MED ORDER — BUDESONIDE-FORMOTEROL FUMARATE 80-4.5 MCG/ACT IN AERO: 1.0000 | INHALATION_SPRAY | Freq: Two times a day (BID) | RESPIRATORY_TRACT | 5 refills | Status: DC

## 2023-01-23 NOTE — Progress Notes (Signed)
@Patient  ID: Virginia Shaw, female    DOB: 10-29-1966, 57 y.o.   MRN: [id number]  No chief complaint on file.   Referring provider: [name], MD  HPI: 57 year old female, never smoker followed for recurrent hemoptysis.  She is a patient Dr. Matilde Bash and last seen in office 08/02/2022.  She is followed by rheumatology for chronic inflammatory arthritis. Past medical history significant for HTN, venous insufficiency, dysphagia, IBS, DDD, benign pituitary adenoma, recurrent UTIs, Chiari.   TEST/EVENTS:  05/17/2020 CT chest without contrast: There is a tiny calcified granuloma in the left lower lobe that is stable.  There are no suspicious pulmonary nodules or mass.  Lungs are overall unremarkable.  There are tiny lesions in the left kidney which are stable since 2015, consistent with benign etiology.  There is also no evidence of acute process or chronic process in pelvis to explain hematuria. 04/12/2022 HRCT chest: Mild bland scarring at the bases.  No ILD.  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    08/02/2022: OV with Dr. Vaughan Browner. Previous worked up for hemoptysis. She had bronchoscopy in July 2023. No evidence of abnormality. She has not had any more episodes of hemoptysis.  Complains of intermittent dyspnea and wheezing.  May have a component of reactive airway disease given intermittent wheezing.  Will start her on Symbicort inhaler.  She was prescribed Singulair at last visit but she wanted to avoid it due to side effects.  Advised her to take over-the-counter Zyrtec or  Claritin for allergic tinnitus.  01/23/2023: Today-surgical clearance Patient presents today for surgical clearance.  She is anticipated to have ACDF with Dr. Annette Stable.  Date TBD.  She has been doing okay since she was here last.  Feeling relatively stable.  She still gets dyspneic with more strenuous activity.  She also notices some occasional wheezing.  She did try the Symbicort after her last visit but felt like it gave her palpitations.  She was not sure if this was due to the steroid or not.  She has been using her Ventolin for rescue purposes, which tends to help.  Using it a few times a week.  This does make her heart race a little bit but it goes away relatively quickly.  She denies any recurrent episodes of hemoptysis, chest congestion, cough.  Allergies  Allergen Reactions   Iodinated Contrast Media Hives and Itching    Throat scratchiness mild *Pt needs to receive a 13 hour prednisone prep prior to future CT scans containing Iodinated contrast media* Patient has prednisone allergy as well!! Throat scratchiness   MRI causes severe headache, swelling,N & V,  bad sore throat and chest pain  Also caused kidney swelling and pain   Penicillins Anaphylaxis and Rash    Did it involve swelling of the face/tongue/throat, SOB, or low BP? Yes Did it involve sudden or severe rash/hives, skin peeling, or any reaction on the inside of your mouth or nose? Yes Did you need to seek medical attention at a hospital or doctor's office? Yes When did it last happen?    don't remember  If all above answers are "NO", may proceed with cephalosporin use.    Prednisone Other (See Comments)  Chest pain and headache. Bladder spasms, chest spasms, increase blood pressure    Propoxyphene Other (See Comments)    Chest pain.     Acetaminophen Other (See Comments)    Cannot take adult strength tylenol, it cause chest pain. Can take 1/4 tsp of Children's Tylenol only without any chest pain.   Benzonatate Other  (See Comments)    Chest pain.   Buprenorphine Hcl Other (See Comments)    Bladder spasms   Citric Acid Hives and Swelling    Fever blisters in mouth   Clindamycin Other (See Comments)    Chest tightness   Codeine Other (See Comments)    Chest pain.   Diazepam Other (See Comments)    Chest pain.   Diclofenac Other (See Comments)    Rapid heartbeat   Esomeprazole Other (See Comments)    Chest pain and rectal bleeding.   Morphine And Related Other (See Comments)    Bladder spasms    Nsaids Other (See Comments)    Bladder spasms, chest pain    Pantoprazole Sodium Other (See Comments)    Chest spasms    Albuterol And Levalbuterol Other (See Comments)    Chest tightness and angina per patient (1 puff as needed)   Ceftin [Cefuroxime Axetil] Other (See Comments)    Chills & shakes Cannot urinate   Cephalexin Other (See Comments)    Unknown   Cortisone Other (See Comments)    unknown reaction   Diltiazem Other (See Comments)    Rapid Heart beat   Doxycycline Other (See Comments)    Chest pain  Bladder spasm   Hydrocodone-Acetaminophen     Chest pain.   Ibuprofen Other (See Comments)    Chest pain  Cannot urinary   Loracarbef Other (See Comments)    All over   Macrobid [Nitrofurantoin Monohyd Macro] Other (See Comments)    Numbness in mouth   Maxalt [Rizatriptan]    Methylprednisolone Sodium Succ Other (See Comments)    unknown reaction   Naproxen     Chest pain   Omeprazole Other (See Comments)    Chest Tightness Bladder spasm   Oxycodone-Acetaminophen     Chest pain. (incontinence)   Phenazopyridine Hcl Other (See Comments)    Unknown reaction   Tomato Hives   Budesonide Other (See Comments)    fatigued   Lidocaine Rash     Cream-Burning & rash Chest pain  Bladder spasm    Immunization History  Administered Date(s) Administered   Tdap 10/08/2018    Past Medical History:  Diagnosis Date   Abnormal involuntary movements(781.0)    Allergic rhinitis,  cause unspecified    Anxiety state, unspecified    Cervical spondylosis without myelopathy    Chronic headaches    Disturbance of skin sensation    Fatty liver    per patient, "not sure but does remember being told"   GERD (gastroesophageal reflux disease)    History of chicken pox    Irritable bowel syndrome    Low blood pressure    Lumbago    Myalgia and myositis, unspecified    Osteoporosis    Other and unspecified angina pectoris    Other malaise and fatigue    Peripheral vascular disease ([place])    per patient "diagnosed with it about 13-14 years ago and does not currently see someone for it, saw Dr. 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   Shortness of  breath on exertion    per patient, "sometimes depends on distance"   Sinus arrhythmia    per patient   Somatization disorder    Spasm of muscle    Stroke ([place]) 09/11/2022   Systemic sclerosis ([facility])    TIA (transient ischemic attack)    Unspecified hereditary and idiopathic peripheral neuropathy     Tobacco History: Social History   Tobacco Use  Smoking Status Never   Passive exposure: Past  Smokeless Tobacco Never   Counseling given: Not Answered   Outpatient Medications Prior to Visit  Medication Sig Dispense Refill   amLODipine (NORVASC) 5 MG tablet Take 5 mg by mouth daily.      Artificial Tear Ointment (DRY EYES OP) Place 1 drop into both eyes daily as needed (Dry eyes). Saline     aspirin EC 81 MG tablet Take 81 mg by mouth. Swallow whole twice a week     isosorbide mononitrate (IMDUR) 60 MG 24 hr tablet 60mg  in the am and 30 mg in the pm     metoprolol succinate (TOPROL-XL) 25 MG 24 hr tablet Take 12.5 mg by mouth daily.     nitroGLYCERIN (NITROSTAT) 0.4 MG SL tablet Place 0.4 mg under the tongue every 5 (five) minutes x 3 doses as needed for chest pain.      vitamin B-12 (CYANOCOBALAMIN) 1000 MCG tablet Take 1,000 mcg by mouth 3 (three)  times a week.     budesonide-formoterol (SYMBICORT) 160-4.5 MCG/ACT inhaler Inhale 2 puffs into the lungs in the morning and at bedtime. 10.2 g 5   CINNAMON PO Take by mouth. Adds to coffee daily (Patient not taking: Reported on 12/26/2022)     Ginger, Zingiber officinalis, (GINGER PO) Take by mouth. Adds to tea daily (Patient not taking: Reported on 12/26/2022)     No facility-administered medications prior to visit.     Review of Systems:   Constitutional: No weight loss or gain, night sweats, fevers, chills, fatigue, or lassitude. HEENT: No headaches, difficulty swallowing, tooth/dental problems, or sore throat. No sneezing, itching, ear ache. +occasional nasal congestion/drainage CV:  No chest pain, orthopnea, PND, swelling in lower extremities, anasarca, dizziness, palpitations, syncope Resp: +shortness of breath with exertion; occasional chest tightness; occasional wheeze. No excess mucus or change in color of mucus. No productive or non-productive. No hemoptysis. No chest wall deformity GI:  No heartburn, indigestion, abdominal pain, nausea, vomiting, diarrhea, change in bowel habits, loss of appetite, bloody stools.  Skin: No rash, lesions, ulcerations MSK:  No joint pain or swelling. Neuro: No dizziness or lightheadedness.  Psych: No depression or anxiety. Mood stable.     Physical Exam:  BP 96/60 (BP Location: Right Arm, Patient Position: Sitting, Cuff Size: Normal)   Pulse 81   Temp 98.4 F (36.9 C) (Oral)   Ht 5' (1.524 m)   Wt 144 lb 6.4 oz (65.5 kg)   SpO2 98%   BMI 28.20 kg/m   GEN: Pleasant, interactive, well-appearing; in no acute distress. HEENT:  Normocephalic and atraumatic. PERRLA. Sclera white. Nasal turbinates erythematous, moist and patent bilaterally. No rhinorrhea present. Oropharynx pink and moist, without exudate or edema. No lesions, ulcerations, or postnasal drip.  NECK:  Supple w/ fair ROM. No JVD present. Normal carotid impulses w/o bruits. Thyroid  symmetrical with no goiter or nodules palpated. No lymphadenopathy.   CV: RRR, no m/r/g, no peripheral edema. Pulses intact, +2 bilaterally. No cyanosis, pallor or clubbing. PULMONARY:  Unlabored, regular breathing. Clear bilaterally A&P w/o wheezes/rales/rhonchi.  No accessory muscle use. No dullness to percussion. GI: BS present and normoactive. Soft, non-tender to palpation.  MSK: No erythema, warmth or tenderness. Cap refil <2 sec all extrem. No deformities or joint swelling noted.  Neuro: A/Ox3. No focal deficits noted.   Skin: Warm, no lesions or rashe Psych: Normal affect and behavior. Judgement and thought content appropriate.     Lab Results:  CBC    Component Value Date/Time   WBC 7.3 01/01/2022 1627   RBC 4.55 01/01/2022 1627   HGB 13.7 01/01/2022 1627   HCT 41.4 01/01/2022 1627   PLT 391 01/01/2022 1627   MCV 91.0 01/01/2022 1627   MCH 30.1 01/01/2022 1627   MCHC 33.1 01/01/2022 1627   RDW 12.2 01/01/2022 1627   LYMPHSABS 2.2 01/01/2022 1627   MONOABS 0.6 01/01/2022 1627   EOSABS 0.5 01/01/2022 1627   BASOSABS 0.1 01/01/2022 1627    BMET    Component Value Date/Time   NA 138 01/01/2022 1627   NA 137 08/25/2014 1140   K 3.9 01/01/2022 1627   CL 103 01/01/2022 1627   CO2 28 01/01/2022 1627   GLUCOSE 95 01/01/2022 1627   BUN 12 01/01/2022 1627   BUN 13 08/25/2014 1140   CREATININE 0.58 01/01/2022 1627   CALCIUM 8.9 01/01/2022 1627   GFRNONAA >60 01/01/2022 1627   GFRAA >60 04/21/2020 1404    BNP    Component Value Date/Time   BNP 22.5 01/01/2022 1629     Imaging:  No results found.       Latest Ref Rng & Units 02/16/2022    8:41 AM  PFT Results  FVC-Pre L 3.02   FVC-Predicted Pre % 99   FVC-Post L 3.12   FVC-Predicted Post % 102   Pre FEV1/FVC % % 76   Post FEV1/FCV % % 80   FEV1-Pre L 2.30   FEV1-Predicted Pre % 96   FEV1-Post L 2.49   DLCO uncorrected ml/min/mmHg 18.63   DLCO UNC% % 101   DLCO corrected ml/min/mmHg 18.46   DLCO COR  %Predicted % 100   DLVA Predicted % 89   TLC L 5.42   TLC % Predicted % 119   RV % Predicted % 139     No results found for: "NITRICOXIDE"      Assessment & Plan:   Reactive airway disease She seems to have component of reactive airway disease, which is overall mild.  Does get good benefit from [place] use.  She had some difficulties with the higher dose Symbicort.  Will retry her on Symbicort 80 mcg and see if she is able to tolerate this.  Educated on proper use.  Medication side effect profile reviewed.  Action plan in place.  Patient Instructions  Continue ventolin 2 puffs every 6 hours as needed for shortness of breath, cough, or wheezing  Try lower dose Symbicort - 1-2 puffs Twice daily. Brush tongue and rinse mouth afterwards   Good luck with your surgery! Up out of bed as soon as possible after your procedure. Use incentive spirometer 10 times an hour while awake during your initial recovery period.   Follow up in 3 months with Dr. Vaughan Browner or Alanson Aly. If symptoms do not improve or worsen, please contact office for sooner follow up or seek emergency care.    Preoperative respiratory examination See above.  Does not appear to be in acute exacerbation. Remains dyspneic with more strenuous activity, which is her baseline.  Moderate risk. Factors that increase  the risk for postoperative pulmonary complications are cervical spine surgery, reactive airway disease.  Respiratory complications generally occur in 1% of ASA Class I patients, 5% of ASA Class II and 10% of ASA Class III-IV patients These complications rarely result in mortality and include postoperative pneumonia, atelectasis, pulmonary embolism, ARDS and increased time requiring postoperative mechanical ventilation.   Overall, I recommend proceeding with the surgery if the risk for respiratory complications are outweighed by the potential benefits. This will need to be discussed between the patient and surgeon.    To reduce risks of respiratory complications, I recommend: --Pre- and post-operative incentive spirometry performed frequently while awake --Inpatient use of currently prescribed inhaler therapies --Avoiding use of pancuronium during anesthesia. --OOB, encourage mobility post-op   1) RISK FOR PROLONGED MECHANICAL VENTILAION - > 48h  1A) Arozullah - Prolonged mech ventilation risk Arozullah Postperative Pulmonary Risk Score - for mech ventilation dependence >48h [employer], [facility] 2000, major non-cardiac surgery) Comment Score  Type of surgery - abd ao aneurysm (27), thoracic (21), neurosurgery / upper abdominal / vascular (21), neck (11) ACDF 21  Emergency Surgery - (11)  0  ALbumin < 3 or poor nutritional state - (9)  0  BUN > 30 -  (8)  0  Partial or completely dependent functional status - (7)  0  COPD -  (6) Mild reactive airway disease 3  Age - 60 to 65 (4), > 70  (6)  0  TOTAL  24  Risk Stratifcation scores  - < 10 (0.5%), 11-19 (1.8%), 20-27 (4.2%), 28-40 (10.1%), >40 (26.6%)  4.2%      I spent 32 minutes of dedicated to the care of this patient on the date of this encounter to include pre-visit review of records, face-to-face time with the patient discussing conditions above, post visit ordering of testing, clinical documentation with the electronic health record, making appropriate referrals as documented, and communicating necessary findings to members of the patients care team.  Clayton Bibles, NP 01/23/2023  Pt aware and understands NP's role.

## 2023-01-23 NOTE — Assessment & Plan Note (Signed)
See above.  Does not appear to be in acute exacerbation. Remains dyspneic with more strenuous activity, which is her baseline.  Moderate risk. Factors that increase the risk for postoperative pulmonary complications are cervical spine surgery, reactive airway disease.  Respiratory complications generally occur in 1% of ASA Class I patients, 5% of ASA Class II and 10% of ASA Class III-IV patients These complications rarely result in mortality and include postoperative pneumonia, atelectasis, pulmonary embolism, ARDS and increased time requiring postoperative mechanical ventilation.   Overall, I recommend proceeding with the surgery if the risk for respiratory complications are outweighed by the potential benefits. This will need to be discussed between the patient and surgeon.   To reduce risks of respiratory complications, I recommend: --Pre- and post-operative incentive spirometry performed frequently while awake --Inpatient use of currently prescribed inhaler therapies --Avoiding use of pancuronium during anesthesia. --OOB, encourage mobility post-op   1) RISK FOR PROLONGED MECHANICAL VENTILAION - > 48h  1A) Arozullah - Prolonged mech ventilation risk Arozullah Postperative Pulmonary Risk Score - for mech ventilation dependence >48h [employer], Ann Surg 2000, major non-cardiac surgery) Comment Score  Type of surgery - abd ao aneurysm (27), thoracic (21), neurosurgery / upper abdominal / vascular (21), neck (11) ACDF 21  Emergency Surgery - (11)  0  ALbumin < 3 or poor nutritional state - (9)  0  BUN > 30 -  (8)  0  Partial or completely dependent functional status - (7)  0  COPD -  (6) Mild reactive airway disease 3  Age - 60 to 69 (4), > 70  (6)  0  TOTAL  24  Risk Stratifcation scores  - < 10 (0.5%), 11-19 (1.8%), 20-27 (4.2%), 28-40 (10.1%), >40 (26.6%)  4.2%

## 2023-01-23 NOTE — Telephone Encounter (Signed)
OV notes and clearance form have been faxed back to [facility]. Nothing further needed at this time.

## 2023-01-23 NOTE — Assessment & Plan Note (Signed)
She seems to have component of reactive airway disease, which is overall mild.  Does get good benefit from [place] use.  She had some difficulties with the higher dose Symbicort.  Will retry her on Symbicort 80 mcg and see if she is able to tolerate this.  Educated on proper use.  Medication side effect profile reviewed.  Action plan in place.  Patient Instructions  Continue ventolin 2 puffs every 6 hours as needed for shortness of breath, cough, or wheezing  Try lower dose Symbicort - 1-2 puffs Twice daily. Brush tongue and rinse mouth afterwards   Good luck with your surgery! Up out of bed as soon as possible after your procedure. Use incentive spirometer 10 times an hour while awake during your initial recovery period.   Follow up in 3 months with Dr. Vaughan Browner or Alanson Aly. If symptoms do not improve or worsen, please contact office for sooner follow up or seek emergency care.

## 2023-01-23 NOTE — Patient Instructions (Signed)
Continue ventolin 2 puffs every 6 hours as needed for shortness of breath, cough, or wheezing  Try lower dose Symbicort - 1-2 puffs Twice daily. Brush tongue and rinse mouth afterwards   Good luck with your surgery! Up out of bed as soon as possible after your procedure. Use incentive spirometer 10 times an hour while awake during your initial recovery period.   Follow up in 3 months with Dr. Vaughan Browner or Alanson Aly. If symptoms do not improve or worsen, please contact office for sooner follow up or seek emergency care.

## 2023-02-11 ENCOUNTER — Other Ambulatory Visit: Payer: Self-pay | Admitting: Neurosurgery

## 2023-02-14 NOTE — Pre-Procedure Instructions (Signed)
Surgical Instructions    Your procedure is scheduled on Monday, April 15.  Report to [facility] Main Entrance "A" at 9:40 A.M., then check in with the Admitting office.  Call this number if you have problems the morning of surgery:  [phone]   If you have any questions prior to your surgery date call [phone]: Open Monday-Friday 8am-4pm If you experience any cold or flu symptoms such as cough, fever, chills, shortness of breath, etc. between now and your scheduled surgery, please notify us at the above number     Remember:  Do not eat or drink after midnight the night before your surgery    Take these medicines the morning of surgery with A SIP OF WATER:  amLODipine (NORVASC)  isosorbide mononitrate (IMDUR)  metoprolol succinate (TOPROL-XL)  If needed: albuterol (VENTOLIN HFA) 108 (90 Base) MCG/ACT inhaler - Please bring all inhalers with you the day of surgery.    Follow your surgeon's instructions on when to stop Aspirin.  If no instructions were given by your surgeon then you will need to call the office to get those instructions.    As of today, STOP taking any  Aleve, Naproxen, Ibuprofen, Motrin, Advil, Goody's, BC's, all herbal medications, fish oil, and all vitamins.         [facility] is not responsible for any belongings or valuables.    Do NOT Smoke (Tobacco/Vaping)  24 hours prior to your procedure  If you use a CPAP at night, you may bring your mask for your overnight stay.   Contacts, glasses, hearing aids, dentures or partials may not be worn into surgery, please bring cases for these belongings   For patients admitted to the hospital, discharge time will be determined by your treatment team.   Patients discharged the day of surgery will not be allowed to drive home, and someone needs to stay with them for 24 hours.   SURGICAL WAITING ROOM VISITATION Patients having surgery or a procedure may have no more than 2 support people in the waiting area -  these visitors may rotate.   Children under the age of 54 must have an adult with them who is not the patient. If the patient needs to stay at the hospital during part of their recovery, the visitor guidelines for inpatient rooms apply. Pre-op nurse will coordinate an appropriate time for 1 support person to accompany patient in pre-op.  This support person may not rotate.   Please refer to [email or web address] for the visitor guidelines for Inpatients (after your surgery is over and you are in a regular room).    Special instructions:    Oral Hygiene is also important to reduce your risk of infection.  Remember - BRUSH YOUR TEETH THE MORNING OF SURGERY WITH YOUR REGULAR TOOTHPASTE   Please follow attached instructions on bathing with CHG.   Day of Surgery:  Take a shower with CHG soap. Wear Clean/Comfortable clothing the morning of surgery Do not wear jewelry or makeup. Do not wear lotions, powders, perfumes/cologne or deodorant. Do not shave 48 hours prior to surgery.  Men may shave face and neck. Do not bring valuables to the hospital. Do not wear nail polish, gel polish, artificial nails, or any other type of covering on natural nails (fingers and toes) If you have artificial nails or gel coating that need to be removed by a nail salon, please have this removed prior to surgery. Artificial nails or gel coating may interfere with anesthesia's ability to adequately  monitor your vital signs.  Remember to brush your teeth WITH YOUR REGULAR TOOTHPASTE.    If you received a COVID test during your pre-op visit, it is requested that you wear a mask when out in public, stay away from anyone that may not be feeling well, and notify your surgeon if you develop symptoms. If you have been in contact with anyone that has tested positive in the last 10 days, please notify your surgeon.    Please read over the following fact sheets that you were  given.

## 2023-02-15 ENCOUNTER — Other Ambulatory Visit: Payer: Self-pay

## 2023-02-15 ENCOUNTER — Encounter (HOSPITAL_COMMUNITY): Payer: Self-pay

## 2023-02-15 ENCOUNTER — Encounter (HOSPITAL_COMMUNITY)
Admission: RE | Admit: 2023-02-15 | Discharge: 2023-02-15 | Disposition: A | Discharge status: Home / Self Care | Payer: Medicaid Other | Source: Ambulatory Visit | Attending: Neurosurgery | Admitting: Neurosurgery

## 2023-02-15 VITALS — BP 100/69 | HR 63 | Temp 98.0°F | Resp 18 | Ht 60.0 in | Wt 143.9 lb

## 2023-02-15 DIAGNOSIS — Z01812 Encounter for preprocedural laboratory examination: Secondary | ICD-10-CM | POA: Diagnosis not present

## 2023-02-15 DIAGNOSIS — Z8673 Personal history of transient ischemic attack (TIA), and cerebral infarction without residual deficits: Secondary | ICD-10-CM | POA: Diagnosis not present

## 2023-02-15 DIAGNOSIS — Z01818 Encounter for other preprocedural examination: Secondary | ICD-10-CM

## 2023-02-15 DIAGNOSIS — J45909 Unspecified asthma, uncomplicated: Secondary | ICD-10-CM | POA: Diagnosis not present

## 2023-02-15 DIAGNOSIS — M4802 Spinal stenosis, cervical region: Secondary | ICD-10-CM | POA: Insufficient documentation

## 2023-02-15 DIAGNOSIS — Q0700 Arnold-Chiari syndrome without spina bifida or hydrocephalus: Secondary | ICD-10-CM | POA: Insufficient documentation

## 2023-02-15 LAB — BASIC METABOLIC PANEL
Anion gap: 8 (ref 5–15)
BUN: 11 mg/dL (ref 6–20)
CO2: 27 mmol/L (ref 22–32)
Calcium: 9.4 mg/dL (ref 8.9–10.3)
Chloride: 105 mmol/L (ref 98–111)
Creatinine, Ser: 0.76 mg/dL (ref 0.44–1.00)
GFR, Estimated: >60 mL/min (ref >60)
Glucose, Bld: 106 mg/dL — ABNORMAL HIGH (ref 70–99)
Potassium: 4.4 mmol/L (ref 3.5–5.1)
Sodium: 140 mmol/L (ref 135–145)

## 2023-02-15 LAB — CBC
HCT: 45.1 % (ref 36.0–46.0)
Hemoglobin: 15 g/dL (ref 12.0–15.0)
MCH: 30.6 pg (ref 26.0–34.0)
MCHC: 33.3 g/dL (ref 30.0–36.0)
MCV: 92 fL (ref 80.0–100.0)
Platelets: 385 10*3/uL (ref 150–400)
RBC: 4.9 MIL/uL (ref 3.87–5.11)
RDW: 12.4 % (ref 11.5–15.5)
WBC: 8.2 10*3/uL (ref 4.0–10.5)
nRBC: 0 % (ref 0.0–0.2)

## 2023-02-15 LAB — SURGICAL PCR SCREEN
MRSA, PCR: NEGATIVE
Staphylococcus aureus: NEGATIVE

## 2023-02-15 NOTE — Anesthesia Preprocedure Evaluation (Signed)
Anesthesia Evaluation  Patient identified by MRN, date of birth, ID band Patient awake    Reviewed: Allergy & Precautions, NPO status , Patient's Chart, lab work & pertinent test results  History of Anesthesia Complications (+) PONV and history of anesthetic complications  Airway Mallampati: II  TM Distance: >3 FB Neck ROM: Full    Dental   Pulmonary asthma    Pulmonary exam normal        Cardiovascular hypertension, Pt. on medications and Pt. on home beta blockers + Peripheral Vascular Disease  Normal cardiovascular exam     Neuro/Psych  Headaches TIA Neuromuscular disease CVA, No Residual Symptoms    GI/Hepatic Neg liver ROS,GERD  ,,  Endo/Other  negative endocrine ROS    Renal/GU negative Renal ROS     Musculoskeletal  (+) Arthritis ,    Abdominal   Peds  Hematology negative hematology ROS (+)   Anesthesia Other Findings   Reproductive/Obstetrics                             Anesthesia Physical Anesthesia Plan  ASA: 2  Anesthesia Plan: General   Post-op Pain Management:    Induction: Intravenous  PONV Risk Score and Plan: 4 or greater and Midazolam, Dexamethasone, Ondansetron and Treatment may vary due to age or medical condition  Airway Management Planned: Oral ETT and Video Laryngoscope Planned  Additional Equipment: None  Intra-op Plan:   Post-operative Plan: Extubation in OR  Informed Consent: I have reviewed the patients History and Physical, chart, labs and discussed the procedure including the risks, benefits and alternatives for the proposed anesthesia with the patient or authorized representative who has indicated his/her understanding and acceptance.     Dental advisory given  Plan Discussed with:   Anesthesia Plan Comments:        Anesthesia Quick Evaluation

## 2023-02-15 NOTE — Progress Notes (Signed)
Anesthesia Chart Review:   Case: [id number] Date/Time: 02/18/23 1125   Procedure: ACDF - C4-C5 - 3C   Anesthesia type: General   Pre-op diagnosis: Stenosis   [facility] / [facility]   Surgeons: Julio Sicks, MD       DISCUSSION: Pt is 57 years old with hx chest pain (with normal coronary angiogram and no coronary spasm), stroke, TIA, Arnold Chiari syndrome, asthma  Pt has frequent episodes of chest pain with and without activity. This has been thoroughly evaluated and found to be non-cardiac (normal cath, no coronary spasm, normal echo last year, normal exercise stress test 2 years ago).   VS: BP 100/69   Pulse 63   Temp 36.7 C (Oral)   Resp 18   Ht 5' (1.524 m)   Wt 65.3 kg   SpO2 99%   BMI 28.10 kg/m   PROVIDERS: - PCP is Merri Brunette, MD - Cardiologist is Sandy Salaam, MD - Pulmonologist is Chilton Greathouse, MD. Determined to be at moderate risk for surgery from pulm standpoint at last office visit 01/23/23 with Micheline Maze, NP  LABS: Labs reviewed: Acceptable for surgery. (all labs ordered are listed, but only abnormal results are displayed)  Labs Reviewed  BASIC METABOLIC PANEL - Abnormal; Notable for the following components:      Result Value   Glucose, Bld 106 (*)    All other components within normal limits  SURGICAL PCR SCREEN  CBC     IMAGES: CXR 01/23/23:  - No active cardiopulmonary disease.   CT chest hi res 04/12/22:  1. Minimal, bland appearing scarring of the dependent bilateral lung bases. No specific evidence of fibrotic interstitial lung disease.  2. No noncontrast CT findings to explain hemoptysis - Aortic Atherosclerosis   EKG 11/16/22 (Dr. Glenford Peers office): sinus rhythm   CV: Cardiac monitor 03/27/22 (Dr. Jerrell Mylar office):   - Note by cadiology Jodell Cipro, NP 05/23/22 documents "Unremarkable Zio patch monitor result from 03/27/2022"  Echo 12/13/21 (care everywhere):  - No previous study for comparison.  - The left ventricular size  is normal.  - There is normal left ventricular wall thickness.  - Left ventricular systolic function is normal.  - LV ejection fraction = 60-65%.  - Left ventricular filling pattern is normal.  - RVSP not able to be calculated.   Exercise treadmill stress test 08/29/21 (care everywhere) - No EKG evidence of ischemia, with target heart rate achieved.  - Good exercise capacity   Isuprel challenge 04/23/11: negative study for coronary spasm  Cardiac cath 08/16/10: normal coronary arteries.    Past Medical History:  Diagnosis Date   Abnormal involuntary movements(781.0)    Allergic rhinitis, cause unspecified    Anxiety state, unspecified    Cervical spondylosis without myelopathy    Chronic headaches    Disturbance of skin sensation    Fatty liver    per patient, "not sure but does remember being told"   GERD (gastroesophageal reflux disease)    History of chicken pox    Irritable bowel syndrome    Low blood pressure    Lumbago    Myalgia and myositis, unspecified    Osteoporosis    Other and unspecified angina pectoris    Other malaise and fatigue    Peripheral vascular disease    per patient "diagnosed with it about 13-14 years ago and does not currently see someone for it, saw Dr. 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   no coronary spasm per cardiology notes 2024   Shortness of breath on exertion    per patient, "sometimes depends on distance"   Sinus arrhythmia    per patient   Somatization disorder    Spasm of muscle    Stroke 09/11/2022   Systemic sclerosis    TIA (transient ischemic attack)    Unspecified hereditary and idiopathic peripheral neuropathy     Past Surgical History:  Procedure Laterality Date   ABDOMINAL HYSTERECTOMY  1997   arnold chiari malformation  2013   BACK SURGERY  2001, 2011,2013   Lumbar x 2   BALLOON DILATION N/A 12/22/2019   Procedure: BALLOON DILATION;  Surgeon:  Charlott Rakes, MD;  Location: [facility];  Service: Endoscopy;  Laterality: N/A;   barium swallow test  10/2019   BLADDER SURGERY  2011   CARDIAC CATHETERIZATION     CARPAL TUNNEL RELEASE Left    CATARACT EXTRACTION     CERVICAL SPINE SURGERY  2013   CHOLECYSTECTOMY     COLONOSCOPY  2009   "Normal" Dr Jarold Motto   COLONOSCOPY  2002   "Normal" Dr Arnaldo Natal   COLONOSCOPY  ~2013   Dr Noe Gens.  "Chest pain with the prep" = aborted   COLONOSCOPY WITH PROPOFOL N/A 03/29/2020   Procedure: COLONOSCOPY WITH PROPOFOL;  Surgeon: Charlott Rakes, MD;  Location: [facility];  Service: Endoscopy;  Laterality: N/A;   CRANIECTOMY SUBOCCIPITAL W/ CERVICAL LAMINECTOMY / CHIARI  2012   Dr Jordan Likes, Chiari decompression with suboccipital craniectomy and   CYSTOCELE REPAIR  2002   Dr Henderson Cloud   ESOPHAGOGASTRODUODENOSCOPY (EGD) WITH PROPOFOL N/A 12/22/2019   Procedure: ESOPHAGOGASTRODUODENOSCOPY (EGD) WITH PROPOFOL;  Surgeon: Charlott Rakes, MD;  Location: [facility];  Service: Endoscopy;  Laterality: N/A;   INSERTION OF MESH Bilateral 10/04/2020   Procedure: INSERTION OF MESH;  Surgeon: Axel Filler, MD;  Location: [facility] OR;  Service: General;  Laterality: Bilateral;   OOPHORECTOMY  1999, 2000   PITUITARY EXCISION  2010   pituitary cyst removed   RECTOCELE REPAIR  2002   Dr. Henderson Cloud   RENAL BIOPSY  08/04/2019   tear duct tubes  2005   TUBAL LIGATION     VIDEO BRONCHOSCOPY N/A 05/14/2022   Procedure: VIDEO BRONCHOSCOPY WITHOUT FLUORO;  Surgeon: Chilton Greathouse, MD;  Location: [facility];  Service: Cardiopulmonary;  Laterality: N/A;   XI ROBOTIC ASSISTED INGUINAL HERNIA REPAIR WITH MESH Bilateral 10/04/2020   Procedure: XI ROBOTIC ASSISTED BILATERAL INGUINAL HERNIA REPAIR WITH MESH;  Surgeon: Axel Filler, MD;  Location: [facility] OR;  Service: General;  Laterality: Bilateral;    MEDICATIONS:  albuterol (VENTOLIN HFA) 108 (90 Base) MCG/ACT inhaler   amLODipine (NORVASC) 5 MG tablet    Artificial Tear Ointment (DRY EYES OP)   aspirin EC 81 MG tablet   budesonide-formoterol (SYMBICORT) 80-4.5 MCG/ACT inhaler   isosorbide mononitrate (IMDUR) 30 MG 24 hr tablet   isosorbide mononitrate (IMDUR) 60 MG 24 hr tablet   metoprolol succinate (TOPROL-XL) 25 MG 24 hr tablet   nitroGLYCERIN (NITROSTAT) 0.4 MG SL tablet   vitamin B-12 (CYANOCOBALAMIN) 1000 MCG tablet   No current facility-administered medications for this encounter.    If no changes, I anticipate pt can proceed with surgery as scheduled.   Rica Mast, PhD, FNP-BC [facility]/Anesthesiology Phone: [phone] 02/15/2023 1:20 PM

## 2023-02-15 NOTE — Progress Notes (Addendum)
PCP - Dr. Merri Brunette Cardiologist - Dr. Rudolpho Sevin, [facility] Pulmonology: Dr. Wilmon Arms  PPM/ICD - Denies  Chest x-ray - 01/24/2023 EKG - 11/16/2022; requested from care everywhere Stress Test - 08/02/2010 ECHO - 12/13/2021 Cardiac Cath - 04/23/2011  Sleep Study - Denies CPAP - Denies  Non-diabetic Last dose of GLP1 agonist-  n/a GLP1 instructions: n/a  Blood Thinner Instructions: Denies Aspirin Instructions: Last dose 02/09/2023  ERAS Protcol - No, NPO PRE-SURGERY Ensure or G2- No  COVID TEST- n/a   Anesthesia review: Yes, CAD, stroke, Hx heart cath, angina, TIA, hempotysis, lung biopsy, asthma  Patient denies shortness of breath, fever, cough and chest pain at PAT appointment   All instructions explained to the patient, with a verbal understanding of the material. Patient agrees to go over the instructions while at home for a better understanding. Patient also instructed to self quarantine after being tested for COVID-19. The opportunity to ask questions was provided.

## 2023-02-18 ENCOUNTER — Observation Stay (HOSPITAL_COMMUNITY)
Admission: RE | Admit: 2023-02-18 | Discharge: 2023-02-19 | Disposition: A | Discharge status: Home / Self Care | Payer: Medicaid Other | Source: Ambulatory Visit | Attending: Neurosurgery | Admitting: Neurosurgery

## 2023-02-18 ENCOUNTER — Other Ambulatory Visit: Payer: Self-pay

## 2023-02-18 ENCOUNTER — Ambulatory Visit (HOSPITAL_BASED_OUTPATIENT_CLINIC_OR_DEPARTMENT_OTHER): Payer: Medicaid Other | Admitting: Anesthesiology

## 2023-02-18 ENCOUNTER — Encounter (HOSPITAL_COMMUNITY)
Admission: RE | Disposition: A | Discharge status: Home / Self Care | Payer: Self-pay | Source: Ambulatory Visit | Attending: Neurosurgery

## 2023-02-18 ENCOUNTER — Encounter (HOSPITAL_COMMUNITY): Payer: Self-pay | Admitting: Neurosurgery

## 2023-02-18 ENCOUNTER — Ambulatory Visit (HOSPITAL_COMMUNITY): Payer: Medicaid Other | Admitting: Emergency Medicine

## 2023-02-18 ENCOUNTER — Ambulatory Visit (HOSPITAL_COMMUNITY): Payer: Medicaid Other

## 2023-02-18 DIAGNOSIS — Z79899 Other long term (current) drug therapy: Secondary | ICD-10-CM | POA: Insufficient documentation

## 2023-02-18 DIAGNOSIS — M4802 Spinal stenosis, cervical region: Secondary | ICD-10-CM | POA: Insufficient documentation

## 2023-02-18 DIAGNOSIS — M4722 Other spondylosis with radiculopathy, cervical region: Principal | ICD-10-CM | POA: Insufficient documentation

## 2023-02-18 DIAGNOSIS — Z8673 Personal history of transient ischemic attack (TIA), and cerebral infarction without residual deficits: Secondary | ICD-10-CM | POA: Diagnosis not present

## 2023-02-18 DIAGNOSIS — M4712 Other spondylosis with myelopathy, cervical region: Secondary | ICD-10-CM

## 2023-02-18 DIAGNOSIS — Z7982 Long term (current) use of aspirin: Secondary | ICD-10-CM | POA: Insufficient documentation

## 2023-02-18 HISTORY — PX: ANTERIOR CERVICAL DECOMP/DISCECTOMY FUSION: SHX1161

## 2023-02-18 SURGERY — ANTERIOR CERVICAL DECOMPRESSION/DISCECTOMY FUSION 1 LEVEL
Anesthesia: General | Site: Spine Cervical

## 2023-02-18 MED ORDER — ALBUTEROL SULFATE HFA 108 (90 BASE) MCG/ACT IN AERS: 1.0000 | INHALATION_SPRAY | Freq: Four times a day (QID) | RESPIRATORY_TRACT | Status: DC | PRN

## 2023-02-18 MED ORDER — VANCOMYCIN HCL IN DEXTROSE 1-5 GM/200ML-% IV SOLN
1000.0000 mg | Freq: Once | INTRAVENOUS | Status: AC
  Administered 2023-02-18: 1000 mg via INTRAVENOUS
  Filled 2023-02-18: qty 200

## 2023-02-18 MED ORDER — ONDANSETRON HCL 4 MG/2ML IJ SOLN
INTRAMUSCULAR | Status: AC
  Filled 2023-02-18: qty 2

## 2023-02-18 MED ORDER — FENTANYL CITRATE (PF) 100 MCG/2ML IJ SOLN: 25.0000 ug | INTRAMUSCULAR | Status: DC | PRN

## 2023-02-18 MED ORDER — METOPROLOL SUCCINATE ER 25 MG PO TB24: 12.5000 mg | ORAL_TABLET | Freq: Every day | ORAL | Status: DC

## 2023-02-18 MED ORDER — DEXAMETHASONE SODIUM PHOSPHATE 10 MG/ML IJ SOLN
INTRAMUSCULAR | Status: AC
  Filled 2023-02-18: qty 1

## 2023-02-18 MED ORDER — ONDANSETRON HCL 4 MG/2ML IJ SOLN
4.0000 mg | Freq: Four times a day (QID) | INTRAMUSCULAR | Status: DC | PRN
  Administered 2023-02-18: 4 mg via INTRAVENOUS
  Filled 2023-02-18: qty 2

## 2023-02-18 MED ORDER — SODIUM CHLORIDE 0.9% FLUSH: 3.0000 mL | INTRAVENOUS | Status: DC | PRN

## 2023-02-18 MED ORDER — EPHEDRINE SULFATE-NACL 50-0.9 MG/10ML-% IV SOSY
PREFILLED_SYRINGE | INTRAVENOUS | Status: DC | PRN
  Administered 2023-02-18 (×2): 5 mg via INTRAVENOUS

## 2023-02-18 MED ORDER — ACETAMINOPHEN 10 MG/ML IV SOLN: 1000.0000 mg | Freq: Four times a day (QID) | INTRAVENOUS | Status: DC

## 2023-02-18 MED ORDER — FENTANYL CITRATE (PF) 250 MCG/5ML IJ SOLN
INTRAMUSCULAR | Status: AC
  Filled 2023-02-18: qty 5

## 2023-02-18 MED ORDER — 0.9 % SODIUM CHLORIDE (POUR BTL) OPTIME
TOPICAL | Status: DC | PRN
  Administered 2023-02-18: 1000 mL

## 2023-02-18 MED ORDER — CHLORHEXIDINE GLUCONATE 0.12 % MT SOLN: 15.0000 mL | Freq: Once | OROMUCOSAL | Status: AC

## 2023-02-18 MED ORDER — ACETAMINOPHEN 325 MG PO TABS
650.0000 mg | ORAL_TABLET | ORAL | Status: DC | PRN
  Administered 2023-02-18: 650 mg via ORAL
  Administered 2023-02-19: 325 mg via ORAL
  Filled 2023-02-18 (×2): qty 2

## 2023-02-18 MED ORDER — FENTANYL CITRATE (PF) 250 MCG/5ML IJ SOLN
INTRAMUSCULAR | Status: DC | PRN
  Administered 2023-02-18 (×3): 50 ug via INTRAVENOUS

## 2023-02-18 MED ORDER — MIDAZOLAM HCL 2 MG/2ML IJ SOLN
INTRAMUSCULAR | Status: AC
  Filled 2023-02-18: qty 2

## 2023-02-18 MED ORDER — CHLORHEXIDINE GLUCONATE 0.12 % MT SOLN
OROMUCOSAL | Status: AC
  Administered 2023-02-18: 15 mL via OROMUCOSAL
  Filled 2023-02-18: qty 15

## 2023-02-18 MED ORDER — DIPHENHYDRAMINE HCL 50 MG/ML IJ SOLN
INTRAMUSCULAR | Status: AC
  Administered 2023-02-18: 6.5 mg via INTRAVENOUS
  Filled 2023-02-18: qty 1

## 2023-02-18 MED ORDER — PHENOL 1.4 % MT LIQD: 1.0000 | OROMUCOSAL | Status: DC | PRN

## 2023-02-18 MED ORDER — SODIUM CHLORIDE 0.9 % IV SOLN
250.0000 mL | INTRAVENOUS | Status: DC
  Administered 2023-02-18: 250 mL via INTRAVENOUS

## 2023-02-18 MED ORDER — MIDAZOLAM HCL 2 MG/2ML IJ SOLN
INTRAMUSCULAR | Status: DC | PRN
  Administered 2023-02-18: .5 mg via INTRAVENOUS

## 2023-02-18 MED ORDER — VITAMIN B-12 1000 MCG PO TABS
1000.0000 ug | ORAL_TABLET | ORAL | Status: DC
  Administered 2023-02-18: 1000 ug via ORAL
  Filled 2023-02-18: qty 1

## 2023-02-18 MED ORDER — GLYCOPYRROLATE PF 0.2 MG/ML IJ SOSY
PREFILLED_SYRINGE | INTRAMUSCULAR | Status: DC | PRN
  Administered 2023-02-18: .2 mg via INTRAVENOUS

## 2023-02-18 MED ORDER — MENTHOL 3 MG MT LOZG: 1.0000 | LOZENGE | OROMUCOSAL | Status: DC | PRN

## 2023-02-18 MED ORDER — VANCOMYCIN HCL IN DEXTROSE 1-5 GM/200ML-% IV SOLN
INTRAVENOUS | Status: AC
  Administered 2023-02-18: 1000 mg via INTRAVENOUS
  Filled 2023-02-18: qty 200

## 2023-02-18 MED ORDER — ONDANSETRON HCL 4 MG/2ML IJ SOLN
INTRAMUSCULAR | Status: DC | PRN
  Administered 2023-02-18: 4 mg via INTRAVENOUS

## 2023-02-18 MED ORDER — ISOSORBIDE MONONITRATE ER 60 MG PO TB24: 60.0000 mg | ORAL_TABLET | Freq: Every morning | ORAL | Status: DC

## 2023-02-18 MED ORDER — HYDROCODONE-ACETAMINOPHEN 5-325 MG PO TABS: 1.0000 | ORAL_TABLET | ORAL | Status: DC | PRN

## 2023-02-18 MED ORDER — CHLORHEXIDINE GLUCONATE CLOTH 2 % EX PADS: 6.0000 | MEDICATED_PAD | Freq: Once | CUTANEOUS | Status: DC

## 2023-02-18 MED ORDER — PROPOFOL 10 MG/ML IV BOLUS
INTRAVENOUS | Status: AC
  Filled 2023-02-18: qty 20

## 2023-02-18 MED ORDER — THROMBIN 20000 UNITS EX SOLR
CUTANEOUS | Status: AC
  Filled 2023-02-18: qty 20000

## 2023-02-18 MED ORDER — VANCOMYCIN HCL IN DEXTROSE 1-5 GM/200ML-% IV SOLN: 1000.0000 mg | INTRAVENOUS | Status: AC

## 2023-02-18 MED ORDER — ACETAMINOPHEN 650 MG RE SUPP: 650.0000 mg | RECTAL | Status: DC | PRN

## 2023-02-18 MED ORDER — DEXAMETHASONE SODIUM PHOSPHATE 10 MG/ML IJ SOLN
INTRAMUSCULAR | Status: DC | PRN
  Administered 2023-02-18: 6 mg via INTRAVENOUS

## 2023-02-18 MED ORDER — AMISULPRIDE (ANTIEMETIC) 5 MG/2ML IV SOLN: 10.0000 mg | Freq: Once | INTRAVENOUS | Status: DC | PRN

## 2023-02-18 MED ORDER — PHENYLEPHRINE HCL-NACL 20-0.9 MG/250ML-% IV SOLN
INTRAVENOUS | Status: DC | PRN
  Administered 2023-02-18: 25 ug/min via INTRAVENOUS

## 2023-02-18 MED ORDER — HYDROMORPHONE HCL 1 MG/ML IJ SOLN: 1.0000 mg | INTRAMUSCULAR | Status: DC | PRN

## 2023-02-18 MED ORDER — ALBUTEROL SULFATE (2.5 MG/3ML) 0.083% IN NEBU: 2.5000 mg | INHALATION_SOLUTION | Freq: Four times a day (QID) | RESPIRATORY_TRACT | Status: DC | PRN

## 2023-02-18 MED ORDER — ISOSORBIDE MONONITRATE ER 60 MG PO TB24
30.0000 mg | ORAL_TABLET | Freq: Every day | ORAL | Status: DC
  Filled 2023-02-18: qty 1

## 2023-02-18 MED ORDER — DIPHENHYDRAMINE HCL 50 MG/ML IJ SOLN: 6.2500 mg | Freq: Once | INTRAMUSCULAR | Status: AC

## 2023-02-18 MED ORDER — DIPHENHYDRAMINE HCL 50 MG/ML IJ SOLN
6.2500 mg | Freq: Four times a day (QID) | INTRAMUSCULAR | Status: DC | PRN
  Administered 2023-02-18: 6.5 mg via INTRAVENOUS
  Filled 2023-02-18: qty 1

## 2023-02-18 MED ORDER — THROMBIN 20000 UNITS EX SOLR
CUTANEOUS | Status: DC | PRN
  Administered 2023-02-18: 20 mL via TOPICAL

## 2023-02-18 MED ORDER — AMLODIPINE BESYLATE 5 MG PO TABS: 5.0000 mg | ORAL_TABLET | Freq: Every day | ORAL | Status: DC

## 2023-02-18 MED ORDER — LACTATED RINGERS IV SOLN: INTRAVENOUS | Status: DC

## 2023-02-18 MED ORDER — PHENYLEPHRINE 80 MCG/ML (10ML) SYRINGE FOR IV PUSH (FOR BLOOD PRESSURE SUPPORT)
PREFILLED_SYRINGE | INTRAVENOUS | Status: AC
  Filled 2023-02-18: qty 10

## 2023-02-18 MED ORDER — MOMETASONE FURO-FORMOTEROL FUM 100-5 MCG/ACT IN AERO: 2.0000 | INHALATION_SPRAY | Freq: Two times a day (BID) | RESPIRATORY_TRACT | Status: DC

## 2023-02-18 MED ORDER — ROCURONIUM BROMIDE 10 MG/ML (PF) SYRINGE
PREFILLED_SYRINGE | INTRAVENOUS | Status: DC | PRN
  Administered 2023-02-18 (×2): 40 mg via INTRAVENOUS

## 2023-02-18 MED ORDER — ACETAMINOPHEN 10 MG/ML IV SOLN
INTRAVENOUS | Status: AC
  Filled 2023-02-18: qty 100

## 2023-02-18 MED ORDER — KETOROLAC TROMETHAMINE 30 MG/ML IJ SOLN
INTRAMUSCULAR | Status: AC
  Filled 2023-02-18: qty 1

## 2023-02-18 MED ORDER — SUGAMMADEX SODIUM 200 MG/2ML IV SOLN
INTRAVENOUS | Status: DC | PRN
  Administered 2023-02-18: 250 mg via INTRAVENOUS

## 2023-02-18 MED ORDER — PHENYLEPHRINE 80 MCG/ML (10ML) SYRINGE FOR IV PUSH (FOR BLOOD PRESSURE SUPPORT)
PREFILLED_SYRINGE | INTRAVENOUS | Status: DC | PRN
  Administered 2023-02-18: 160 ug via INTRAVENOUS
  Administered 2023-02-18: 80 ug via INTRAVENOUS

## 2023-02-18 MED ORDER — CYCLOBENZAPRINE HCL 10 MG PO TABS
10.0000 mg | ORAL_TABLET | Freq: Three times a day (TID) | ORAL | Status: DC | PRN
  Filled 2023-02-18: qty 1

## 2023-02-18 MED ORDER — LIDOCAINE 2% (20 MG/ML) 5 ML SYRINGE
INTRAMUSCULAR | Status: AC
  Filled 2023-02-18: qty 5

## 2023-02-18 MED ORDER — HYDROCODONE-ACETAMINOPHEN 10-325 MG PO TABS: 1.0000 | ORAL_TABLET | ORAL | Status: DC | PRN

## 2023-02-18 MED ORDER — NITROGLYCERIN 0.4 MG SL SUBL: 0.4000 mg | SUBLINGUAL_TABLET | SUBLINGUAL | Status: DC | PRN

## 2023-02-18 MED ORDER — SODIUM CHLORIDE 0.9% FLUSH
3.0000 mL | Freq: Two times a day (BID) | INTRAVENOUS | Status: DC
  Administered 2023-02-18: 3 mL via INTRAVENOUS

## 2023-02-18 MED ORDER — ORAL CARE MOUTH RINSE: 15.0000 mL | Freq: Once | OROMUCOSAL | Status: AC

## 2023-02-18 MED ORDER — ALBUMIN HUMAN 5 % IV SOLN: INTRAVENOUS | Status: DC | PRN

## 2023-02-18 MED ORDER — ONDANSETRON HCL 4 MG PO TABS
4.0000 mg | ORAL_TABLET | Freq: Four times a day (QID) | ORAL | Status: DC | PRN
  Administered 2023-02-18: 1 mg via ORAL
  Administered 2023-02-19: 4 mg via ORAL
  Filled 2023-02-18 (×2): qty 1

## 2023-02-18 MED ORDER — MUPIROCIN 2 % EX OINT
TOPICAL_OINTMENT | CUTANEOUS | Status: AC
  Filled 2023-02-18: qty 22

## 2023-02-18 MED ORDER — PROPOFOL 10 MG/ML IV BOLUS
INTRAVENOUS | Status: DC | PRN
  Administered 2023-02-18: 110 mg via INTRAVENOUS

## 2023-02-18 MED ORDER — ROCURONIUM BROMIDE 10 MG/ML (PF) SYRINGE
PREFILLED_SYRINGE | INTRAVENOUS | Status: AC
  Filled 2023-02-18: qty 20

## 2023-02-18 MED ORDER — LIDOCAINE 2% (20 MG/ML) 5 ML SYRINGE
INTRAMUSCULAR | Status: DC | PRN
  Administered 2023-02-18: 40 mg via INTRAVENOUS

## 2023-02-18 SURGICAL SUPPLY — 55 items
APL SKNCLS STERI-STRIP NONHPOA (GAUZE/BANDAGES/DRESSINGS) ×1
BAG COUNTER SPONGE SURGICOUNT (BAG) ×1 IMPLANT
BAG DECANTER FOR FLEXI CONT (MISCELLANEOUS) ×1 IMPLANT
BAG SPNG CNTER NS LX DISP (BAG) ×1
BAND INSRT 18 STRL LF DISP RB (MISCELLANEOUS) ×2
BAND RUBBER #18 3X1/16 STRL (MISCELLANEOUS) ×2 IMPLANT
BENZOIN TINCTURE PRP APPL 2/3 (GAUZE/BANDAGES/DRESSINGS) ×1 IMPLANT
BIT DRILL 13 (BIT) IMPLANT
BUR MATCHSTICK NEURO 3.0 LAGG (BURR) ×1 IMPLANT
CAGE PEEK 6X14X11 (Cage) ×1 IMPLANT
CANISTER SUCT 3000ML PPV (MISCELLANEOUS) ×1 IMPLANT
DRAPE C-ARM 42X72 X-RAY (DRAPES) ×2 IMPLANT
DRAPE LAPAROTOMY 100X72 PEDS (DRAPES) ×1 IMPLANT
DRAPE MICROSCOPE SLANT 54X150 (MISCELLANEOUS) ×1 IMPLANT
DURAPREP 6ML APPLICATOR 50/CS (WOUND CARE) ×1 IMPLANT
ELECT COATED BLADE 2.86 ST (ELECTRODE) ×1 IMPLANT
ELECT REM PT RETURN 9FT ADLT (ELECTROSURGICAL) ×1
ELECTRODE REM PT RTRN 9FT ADLT (ELECTROSURGICAL) ×1 IMPLANT
GAUZE 4X4 16PLY ~~LOC~~+RFID DBL (SPONGE) IMPLANT
GAUZE SPONGE 4X4 12PLY STRL (GAUZE/BANDAGES/DRESSINGS) ×1 IMPLANT
GLOVE ECLIPSE 9.0 STRL (GLOVE) ×1 IMPLANT
GLOVE EXAM NITRILE XL STR (GLOVE) IMPLANT
GOWN STRL REUS W/ TWL LRG LVL3 (GOWN DISPOSABLE) IMPLANT
GOWN STRL REUS W/ TWL XL LVL3 (GOWN DISPOSABLE) IMPLANT
GOWN STRL REUS W/TWL 2XL LVL3 (GOWN DISPOSABLE) IMPLANT
GOWN STRL REUS W/TWL LRG LVL3 (GOWN DISPOSABLE)
GOWN STRL REUS W/TWL XL LVL3 (GOWN DISPOSABLE)
HALTER HD/CHIN CERV TRACTION D (MISCELLANEOUS) ×1 IMPLANT
HEMOSTAT POWDER KIT SURGIFOAM (HEMOSTASIS) IMPLANT
KIT BASIN OR (CUSTOM PROCEDURE TRAY) ×1 IMPLANT
KIT TURNOVER KIT B (KITS) ×1 IMPLANT
NDL SPNL 20GX3.5 QUINCKE YW (NEEDLE) ×1 IMPLANT
NEEDLE SPNL 20GX3.5 QUINCKE YW (NEEDLE) ×1 IMPLANT
NS IRRIG 1000ML POUR BTL (IV SOLUTION) ×1 IMPLANT
PACK LAMINECTOMY NEURO (CUSTOM PROCEDURE TRAY) ×1 IMPLANT
PAD ARMBOARD 7.5X6 YLW CONV (MISCELLANEOUS) ×3 IMPLANT
PLATE ELITE VISION 25MM (Plate) IMPLANT
SCREW ST 13X4XST VA NS SPNE (Screw) IMPLANT
SCREW ST VAR 4 ATL (Screw) ×4 IMPLANT
SOL ELECTROSURG ANTI STICK (MISCELLANEOUS)
SOLUTION ELECTROSURG ANTI STCK (MISCELLANEOUS) ×1 IMPLANT
SPACER SPNL 11X14X6XPEEK CVD (Cage) IMPLANT
SPCR SPNL 11X14X6XPEEK CVD (Cage) ×1 IMPLANT
SPONGE INTESTINAL PEANUT (DISPOSABLE) ×1 IMPLANT
SPONGE SURGIFOAM ABS GEL 100 (HEMOSTASIS) IMPLANT
SPONGE SURGIFOAM ABS GEL SZ50 (HEMOSTASIS) ×1 IMPLANT
STRIP CLOSURE SKIN 1/2X4 (GAUZE/BANDAGES/DRESSINGS) ×1 IMPLANT
SUT VIC AB 3-0 SH 8-18 (SUTURE) ×1 IMPLANT
SUT VIC AB 4-0 RB1 18 (SUTURE) ×1 IMPLANT
TAPE CLOTH 4X10 WHT NS (GAUZE/BANDAGES/DRESSINGS) ×1 IMPLANT
TAPE CLOTH SURG 4X10 WHT LF (GAUZE/BANDAGES/DRESSINGS) IMPLANT
TOWEL GREEN STERILE (TOWEL DISPOSABLE) ×1 IMPLANT
TOWEL GREEN STERILE FF (TOWEL DISPOSABLE) ×1 IMPLANT
TRAP SPECIMEN MUCUS 40CC (MISCELLANEOUS) ×1 IMPLANT
WATER STERILE IRR 1000ML POUR (IV SOLUTION) ×1 IMPLANT

## 2023-02-18 NOTE — Op Note (Signed)
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  Postoperative diagnosis: Same  Procedure Name: C4-5 anterior cervical discectomy with interbody cage, local harvested autograft, and anterior plate instrumentation.  Removal of C5-C7 anterior instrumentation  Surgeon:Pegah Segel A.Jackee Glasner, M.D.  Asst. Surgeon: Doran Durand, NP  Assistant utilized during exposure, decompression, fusion and instrumentation and closure  Anesthesia: General  Indication: 57 year old female remotely status post C5-C7 anterior cervical discectomy and fusion presents now with worsening neck and bilateral shoulder pain failing conservative management.  Workup demonstrates evidence of progressive disc degeneration with associated spondylosis and stenosis at C4-5.  Patient has failed conservative management presents now for C4-5 anterior cervical discectomy and fusion.  Her previously placed C5-C7 instrumentation will need to be removed in order to perform instrumentation and fusion at the C4-5 level.  Operative note: After induction of anesthesia, patient positioned supine with neck slightly extended [employer] BicEL traction.  Patient's anterior cervical region prepped and draped sterilely.  Incision made overlying C5.  Dissection performed on the right.  Retractor placed.  Fluoroscopy used.  Levels confirmed.  Patient previously placed anterior cervical plate from M3-T5 was dissected free, disassembled and removed.  Fusion was inspected at C5-6 and C6-7 and found to be solid.  Disc base at C4-5 was incised.  Discectomy performed using various instruments down to level the posterior annulus.  Microscope was then brought into the field used throughout the remainder of the discectomy.  Remaining aspects of annulus and osteophytes were removed using high-speed drill down to level the posterior longitudinal ligament.  Posterior logical  was then elevated and resected in piecemeal fashion.  Underlying thecal sac was then identified.  Wide central decompression then performed undercutting the bodies of C4 and C5.  Decompression then proceeded to each neural foramina.  Wide anterior foraminotomies performed along the course exiting C5 nerve roots bilaterally.  At this point a very thorough decompression had been achieved.  There was no evidence of injury to the thecal sac nerve roots.  Wound was irrigated.  Gelfoam was placed topically for hemostasis then removed.  6 mm [employer] anatomic peek cage was packed with locally harvested autograft.  This was then impacted into place and recessed slightly from the anterior cortical margin of C4 and C5.  Atlantis anterior cervical plate was then placed over the C4 and C5 level.  This then attached under fluoroscopic guidance using 13 mm variable angle screws to each at both levels.  All 4 screws given final tightening found to be solidly within the bone.  Locking screws engaged both levels.  Final images revealed good position of the cage and the hardware at the proper level with normal alignment of the spine.  Hemostasis was achieved with bipolar cautery.  Wounds and closed in layers with Vicryl sutures.  Steri-Strips and sterile dressing were applied.  No apparent complications.  Patient tolerated the procedure well.  She returns to the recovery room postop.

## 2023-02-18 NOTE — Progress Notes (Signed)
Orthopedic Tech Progress Note Patient Details:  Virginia Shaw [birth year] [id number]  Applied while patient was in PACU   Ortho Devices Type of Ortho Device: Soft collar Ortho Device/Splint Location: NECK Ortho Device/Splint Interventions: Ordered, Application   Post Interventions Patient Tolerated: Well Instructions Provided: Care of device  Donald Pore 02/18/2023, 5:11 PM

## 2023-02-18 NOTE — H&P (Signed)
Virginia Shaw is an 57 y.o. female.   Chief Complaint: Neck pain HPI: 57 year old female with neck pain with radiation of bilateral shoulders.  She is remotely status post C5-C7 anterior cervical discectomy and fusion.  Workup demonstrates evidence of progressive disc degeneration with broad-based disc bulging and foraminal stenosis at C4-5.  She has failed conservative management presents now for C4-5 anterior cervical discectomy and fusion in hopes of improving her symptoms.  Past Medical History:  Diagnosis Date   Abnormal involuntary movements(781.0)    Allergic rhinitis, cause unspecified    Anxiety state, unspecified    Cervical spondylosis without myelopathy    Chronic headaches    Disturbance of skin sensation    Fatty liver    per patient, "not sure but does remember being told"   GERD (gastroesophageal reflux disease)    History of chicken pox    Irritable bowel syndrome    Low blood pressure    Lumbago    Myalgia and myositis, unspecified    Osteoporosis    Other and unspecified angina pectoris    Other malaise and fatigue    Peripheral vascular disease    per patient "diagnosed with it about 13-14 years ago and does not currently see someone for it, saw Dr. 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   no coronary spasm per cardiology notes 2024   Shortness of breath on exertion    per patient, "sometimes depends on distance"   Sinus arrhythmia    per patient   Somatization disorder    Spasm of muscle    Stroke 09/11/2022   Systemic sclerosis    TIA (transient ischemic attack)    Unspecified hereditary and idiopathic peripheral neuropathy     Past Surgical History:  Procedure Laterality Date   ABDOMINAL HYSTERECTOMY  1997   arnold chiari malformation  2013   BACK SURGERY  2001, 2011,2013   Lumbar x 2   BALLOON DILATION N/A 12/22/2019   Procedure: BALLOON DILATION;  Surgeon: Charlott Rakes,  MD;  Location: [facility];  Service: Endoscopy;  Laterality: N/A;   barium swallow test  10/2019   BLADDER SURGERY  2011   CARDIAC CATHETERIZATION     CARPAL TUNNEL RELEASE Left    CATARACT EXTRACTION     CERVICAL SPINE SURGERY  2013   CHOLECYSTECTOMY     COLONOSCOPY  2009   "Normal" Dr Jarold Motto   COLONOSCOPY  2002   "Normal" Dr Arnaldo Natal   COLONOSCOPY  ~2013   Dr Noe Gens.  "Chest pain with the prep" = aborted   COLONOSCOPY WITH PROPOFOL N/A 03/29/2020   Procedure: COLONOSCOPY WITH PROPOFOL;  Surgeon: Charlott Rakes, MD;  Location: [facility];  Service: Endoscopy;  Laterality: N/A;   CRANIECTOMY SUBOCCIPITAL W/ CERVICAL LAMINECTOMY / CHIARI  2012   Dr Jordan Likes, Chiari decompression with suboccipital craniectomy and   CYSTOCELE REPAIR  2002   Dr Henderson Cloud   ESOPHAGOGASTRODUODENOSCOPY (EGD) WITH PROPOFOL N/A 12/22/2019   Procedure: ESOPHAGOGASTRODUODENOSCOPY (EGD) WITH PROPOFOL;  Surgeon: Charlott Rakes, MD;  Location: [facility];  Service: Endoscopy;  Laterality: N/A;   INSERTION OF MESH Bilateral 10/04/2020   Procedure: INSERTION OF MESH;  Surgeon: Axel Filler, MD;  Location: [facility] OR;  Service: General;  Laterality: Bilateral;   OOPHORECTOMY  1999, 2000   PITUITARY EXCISION  2010   pituitary cyst removed   RECTOCELE REPAIR  2002   Dr. Henderson Cloud   RENAL BIOPSY  08/04/2019  tear duct tubes  2005   TUBAL LIGATION     VIDEO BRONCHOSCOPY N/A 05/14/2022   Procedure: VIDEO BRONCHOSCOPY WITHOUT FLUORO;  Surgeon: Chilton Greathouse, MD;  Location: [facility];  Service: Cardiopulmonary;  Laterality: N/A;   XI ROBOTIC ASSISTED INGUINAL HERNIA REPAIR WITH MESH Bilateral 10/04/2020   Procedure: XI ROBOTIC ASSISTED BILATERAL INGUINAL HERNIA REPAIR WITH MESH;  Surgeon: Axel Filler, MD;  Location: [facility] OR;  Service: General;  Laterality: Bilateral;    Family History  Problem Relation Age of Onset   Hypertension Father    Hyperlipidemia Father    Diabetes Father    Stroke Father     Cancer Maternal Uncle        lung   Cancer Paternal Aunt        lung   Migraines Mother    Heart attack Paternal Grandmother    Heart attack Paternal Grandfather    Hypertension Paternal Grandfather    Migraines Sister    Social History:  reports that she has never smoked. She has been exposed to tobacco smoke. She has never used smokeless tobacco. She reports that she does not drink alcohol and does not use drugs.  Allergies:  Allergies  Allergen Reactions   Iodinated Contrast Media Hives and Itching    Throat scratchiness mild *Pt needs to receive a 13 hour prednisone prep prior to future CT scans containing Iodinated contrast media* Patient has prednisone allergy as well!! Throat scratchiness   MRI causes severe headache, swelling,N & V,  bad sore throat and chest pain  Also caused kidney swelling and pain   Penicillins Anaphylaxis and Rash    Did it involve swelling of the face/tongue/throat, SOB, or low BP? Yes Did it involve sudden or severe rash/hives, skin peeling, or any reaction on the inside of your mouth or nose? Yes Did you need to seek medical attention at a hospital or doctor's office? Yes When did it last happen?    don't remember  If all above answers are "NO", may proceed with cephalosporin use.    Prednisone Other (See Comments)    Chest pain and headache. Bladder spasms, chest spasms, increase blood pressure    Propoxyphene Other (See Comments)    Chest pain.     Acetaminophen Other (See Comments)    Cannot take adult strength tylenol, it cause chest pain. Can take 1/4 tsp of Children's Tylenol only without any chest pain.   Benzonatate Other (See Comments)    Chest pain.   Buprenorphine Hcl Other (See Comments)    Bladder spasms   Citric Acid Hives and Swelling    Fever blisters in mouth   Clindamycin Other (See Comments)    Chest tightness   Codeine Other (See Comments)    Chest pain.   Diazepam Other (See Comments)    Chest pain.    Diclofenac Palpitations    Rapid heartbeat   Esomeprazole Other (See Comments)    Chest pain and rectal bleeding.   Morphine And Related Other (See Comments)    Bladder spasms    Nsaids Other (See Comments)    Bladder spasms, chest pain    Pantoprazole Sodium Other (See Comments)    Chest spasms    Albuterol And Levalbuterol Other (See Comments)    Chest tightness and angina per patient (1 puff as needed)   Ceftin [Cefuroxime Axetil] Other (See Comments)    Chills & shakes Cannot urinate   Cephalexin Other (See Comments)    Unknown  Cortisone Other (See Comments)    unknown reaction   Doxycycline Other (See Comments)    Chest pain  Bladder spasm   Hydrocodone-Acetaminophen     Chest pain.   Ibuprofen Other (See Comments)    Chest pain  Cannot urinary   Loracarbef Other (See Comments)    All over   Macrobid [Nitrofurantoin Monohyd Macro] Other (See Comments)    Numbness in mouth   Maxalt [Rizatriptan]    Methylprednisolone Sodium Succ Other (See Comments)    unknown reaction   Naproxen     Chest pain   Omeprazole Other (See Comments)    Chest Tightness Bladder spasm   Oxycodone-Acetaminophen     Chest pain. (incontinence)   Phenazopyridine Hcl Other (See Comments)    Unknown reaction   Tomato Hives   Budesonide Other (See Comments)    fatigued   Diltiazem Palpitations    Rapid Heart beat   Lidocaine Rash     Cream-Burning & rash Chest pain  Bladder spasm    Medications Prior to Admission  Medication Sig Dispense Refill   albuterol (VENTOLIN HFA) 108 (90 Base) MCG/ACT inhaler Inhale 1-2 puffs into the lungs every 6 (six) hours as needed for wheezing or shortness of breath.     amLODipine (NORVASC) 5 MG tablet Take 5 mg by mouth daily.      Artificial Tear Ointment (DRY EYES OP) Place 1 drop into both eyes daily as needed (Dry eyes). Saline     aspirin EC 81 MG tablet Take 81 mg by mouth 2 (two) times a week. Swallow whole     isosorbide mononitrate  (IMDUR) 30 MG 24 hr tablet Take 30 mg by mouth at bedtime.     isosorbide mononitrate (IMDUR) 60 MG 24 hr tablet Take 60 mg by mouth in the morning.     metoprolol succinate (TOPROL-XL) 25 MG 24 hr tablet Take 12.5 mg by mouth daily.     nitroGLYCERIN (NITROSTAT) 0.4 MG SL tablet Place 0.4 mg under the tongue every 5 (five) minutes x 3 doses as needed for chest pain.      vitamin B-12 (CYANOCOBALAMIN) 1000 MCG tablet Take 1,000 mcg by mouth every 7 (seven) days.     budesonide-formoterol (SYMBICORT) 80-4.5 MCG/ACT inhaler Inhale 1 puff into the lungs in the morning and at bedtime. (Patient not taking: Reported on 02/13/2023) 1 each 5    No results found for this or any previous visit (from the past 48 hour(s)). No results found.  Pertinent items noted in HPI and remainder of comprehensive ROS otherwise negative.  Blood pressure 116/79, pulse 85, temperature 98.1 F (36.7 C), temperature source Oral, resp. rate 20, height 5' (1.524 m), weight 65.8 kg, SpO2 95 %.  Patient is awake and alert.  She is oriented and appropriate.  Speech is fluent.  Judgment insight are intact.  Cranial nerve function normal bilateral.  Motor examination with some mild weakness of her shoulder abduction bilaterally otherwise motor strength intact.  Sensory examination with patchy distal sensory loss in both upper extremities.  Reflexes brisk but symmetric.  No evidence of long track signs.  Gait and posture normal.  Examination head ears eyes nose and throat is unremarked.  Chest and abdomen are benign.  Extremities are free from injury deformity. Assessment/Plan C4-5 spondylosis with stenosis and radiculopathy.  Plan C4-5 anterior cervical discectomy and fusion utilizing interbody cage, local harvested autograft, and anterior plate instrumentation.  This will require removal of her prior C5-C7 plate.  Risks and benefits been explained.  Patient wishes proceed.  Sherilyn Cooter A Mattalyn Anderegg 02/18/2023, 11:30 AM

## 2023-02-18 NOTE — Transfer of Care (Signed)
Immediate Anesthesia Transfer of Care Note  Patient: Virginia Shaw  Procedure(s) Performed: Anterior Cervical Decompression Fusion - Cervical four-Cervical five (Spine Cervical)  Patient Location: PACU  Anesthesia Type:General  Level of Consciousness: drowsy and patient cooperative  Airway & Oxygen Therapy: Patient Spontanous Breathing and Patient connected to face mask oxygen  Post-op Assessment: Report given to RN, Post -op Vital signs reviewed and stable, and Patient moving all extremities X 4  Post vital signs: Reviewed and stable  Last Vitals:  Vitals Value Taken Time  BP 122/71 02/18/23 1417  Temp 36.5 C 02/18/23 1415  Pulse 80 02/18/23 1421  Resp 12 02/18/23 1421  SpO2 93 % 02/18/23 1421  Vitals shown include unvalidated device data.  Last Pain:  Vitals:   02/18/23 1415  TempSrc:   PainSc: Asleep         Complications: No notable events documented.

## 2023-02-18 NOTE — Anesthesia Procedure Notes (Signed)
Procedure Name: Intubation Date/Time: 02/18/2023 12:40 PM  Performed by: Alvera Novel, CRNAPre-anesthesia Checklist: Patient identified, Emergency Drugs available, Suction available and Patient being monitored Patient Re-evaluated:Patient Re-evaluated prior to induction Oxygen Delivery Method: Circle System Utilized Preoxygenation: Pre-oxygenation with 100% oxygen Induction Type: IV induction Ventilation: Mask ventilation without difficulty Laryngoscope Size: Glidescope and 3 Grade View: Grade I Tube type: Oral Tube size: 6.5 mm Number of attempts: 1 Airway Equipment and Method: Stylet Placement Confirmation: ETT inserted through vocal cords under direct vision, positive ETCO2 and breath sounds checked- equal and bilateral Secured at: 22 cm Tube secured with: Tape Dental Injury: Teeth and Oropharynx as per pre-operative assessment

## 2023-02-18 NOTE — Brief Op Note (Signed)
02/18/2023  1:57 PM  PATIENT:  Georgann Housekeeper  57 y.o. female  PRE-OPERATIVE DIAGNOSIS:  Stenosis  POST-OPERATIVE DIAGNOSIS:  Stenosis  PROCEDURE:  Procedure(s) with comments: Anterior Cervical Decompression Fusion - Cervical four-Cervical five (N/A) - 3C  SURGEON:  Surgeon(s) and Role:    Julio Sicks, MD - Primary  PHYSICIAN ASSISTANT:   ASSISTANTSMarland Mcalpine   ANESTHESIA:   general  EBL:  Minimal   BLOOD ADMINISTERED:none  DRAINS: none   LOCAL MEDICATIONS USED:  NONE  SPECIMEN:  No Specimen  DISPOSITION OF SPECIMEN:  N/A  COUNTS:  YES  TOURNIQUET:  * No tourniquets in log *  DICTATION: .Dragon Dictation and Note written in paper chart  PLAN OF CARE: Admit for overnight observation  PATIENT DISPOSITION:  PACU - hemodynamically stable.   Delay start of Pharmacological VTE agent (>24hrs) due to surgical blood loss or risk of bleeding: yes

## 2023-02-19 ENCOUNTER — Encounter (HOSPITAL_COMMUNITY): Payer: Self-pay | Admitting: Neurosurgery

## 2023-02-19 DIAGNOSIS — M4722 Other spondylosis with radiculopathy, cervical region: Secondary | ICD-10-CM | POA: Diagnosis not present

## 2023-02-19 MED ORDER — CYCLOBENZAPRINE HCL 5 MG PO TABS: 5.0000 mg | ORAL_TABLET | Freq: Three times a day (TID) | ORAL | 0 refills | Status: DC | PRN

## 2023-02-19 NOTE — Discharge Instructions (Signed)
Wound Care Keep incision covered and dry until post op day 3. You may remove the gauze dressing on post op day 3. Leave steri-strips on neck.  They will fall off by themselves. Do not put any creams, lotions, or ointments on incision. You are fine to shower. Let water run over incision and pat dry.  Activity Walk each and every day, increasing distance each day. No lifting greater than 5 lbs.  Avoid excessive neck motion. No driving for 2 weeks; may ride as a passenger locally.  Diet Resume your normal diet.   Return to Work Will be discussed at your follow up appointment.  Call Your Doctor If Any of These Occur Redness, drainage, or swelling at the wound.  Temperature greater than 101 degrees. Severe pain not relieved by pain medication. Incision starts to come apart.  Follow Up Appt Call [phone] today for appointment in 2 weeks if you don't already have one or for any problems.

## 2023-02-19 NOTE — Anesthesia Postprocedure Evaluation (Signed)
Anesthesia Post Note  Patient: Virginia Shaw  Procedure(s) Performed: Anterior Cervical Decompression Fusion - Cervical four-Cervical five (Spine Cervical)     Patient location during evaluation: PACU Anesthesia Type: General Level of consciousness: awake and alert Pain management: pain level controlled Vital Signs Assessment: post-procedure vital signs reviewed and stable Respiratory status: spontaneous breathing, nonlabored ventilation, respiratory function stable and patient connected to nasal cannula oxygen Cardiovascular status: blood pressure returned to baseline and stable Postop Assessment: no apparent nausea or vomiting Anesthetic complications: no   No notable events documented.  Last Vitals:  Vitals:   02/19/23 0257 02/19/23 0726  BP: 109/71 112/71  Pulse: 62 80  Resp: 18 16  Temp: 36.8 C (!) 36.4 C  SpO2: 97% 99%    Last Pain:  Vitals:   02/19/23 0257  TempSrc: Oral  PainSc:                  Virginia Shaw

## 2023-02-19 NOTE — Progress Notes (Signed)
Pt states she is only able to tolerate 25% of the dose of each PRN due to sensitivity. Pt has 35 allergies. Pt states she is unable to tolerate IV Vanc a few minutes after dose initiated, despite IV Benadryl. Pt requested IV site discontinuation due to localized pain. IV positive for blood return, but discontinued per request. 25% dose of PO Zofran given for slight nausea. Will continue to monitor.

## 2023-02-19 NOTE — Discharge Summary (Cosign Needed Addendum)
Physician Discharge Summary     Providing Compassionate, Quality Care - Together   Patient ID: Virginia Shaw MRN: [id number] DOB/AGE: Sep 15, 1966 57 y.o.  Admit date: 02/18/2023 Discharge date: 02/19/2023  Admission Diagnoses: Cervical spondylosis with myelopathy and radiculopathy  Discharge Diagnoses:  Principal Problem:   Cervical spondylosis with myelopathy and radiculopathy   Discharged Condition: good  Hospital Course: Patient underwent a C4-5 ACDF by Dr. Jordan Likes on 02/18/2023. She was admitted to 3C07 following recovery from anesthesia in the PACU. Her postoperative course has been uncomplicated. 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 She has worked with occupational therapy who feels the patient is ready for discharge home. She is ambulating independently and without difficulty. She is tolerating a normal diet. She is not having any bowel or bladder dysfunction. Her pain is well-controlled with oral pain medication. She is ready for discharge home.   Consults: None  Significant Diagnostic Studies: radiology: DG Cervical Spine 1 View  Result Date: 02/18/2023 CLINICAL DATA:  [id number] Elective surgery [id number] EXAM: DG CERVICAL SPINE - 1 VIEW COMPARISON:  None Available. FINDINGS: Intraoperative images during C4-C5 ACDF.  Hardware is intact. IMPRESSION: Intraoperative images during C4-C5 ACDF.  Hardware is intact. Electronically Signed   By: Caprice Renshaw M.D.   On: 02/18/2023 14:55   DG C-Arm 1-60 Min-No Report  Result Date: 02/18/2023 Fluoroscopy was utilized by the requesting physician.  No radiographic interpretation.   DG C-Arm 1-60 Min-No Report  Result Date: 02/18/2023 Fluoroscopy was utilized by the requesting physician.  No radiographic interpretation.     Treatments: surgery: C4-5 anterior cervical discectomy with interbody cage, local harvested autograft, and anterior plate instrumentation.   Removal of C5-C7 anterior instrumentation  Discharge  Exam: Blood pressure 112/71, pulse 80, temperature (!) 97.5 F (36.4 C), resp. rate 16, height 5' (1.524 m), weight 65.8 kg, SpO2 99 %.  Alert and oriented x 4 PERRLA CN II-XII grossly intact MAE, Sensation intact and strength is intact aside from right deltoid weakness 3/5 Incision is covered with gauze dressing and Steri Strips; Dressing is dry and intact, with a small amount of dried sanguinous drainage   Disposition: Discharge disposition: 01-Home or Self Care       Discharge Instructions     Call MD for:  difficulty breathing, headache or visual disturbances   Complete by: As directed    Call MD for:  hives   Complete by: As directed    Call MD for:  persistant nausea and vomiting   Complete by: As directed    Call MD for:  redness, tenderness, or signs of infection (pain, swelling, redness, odor or green/yellow discharge around incision site)   Complete by: As directed    Call MD for:  severe uncontrolled pain   Complete by: As directed    Call MD for:  temperature >100.4   Complete by: As directed    Diet - low sodium heart healthy   Complete by: As directed    If the dressing is still on your incision site when you go home, remove it on the third day after your surgery date. Remove dressing if it begins to fall off, or if it is dirty or damaged before the third day.   Complete by: As directed    Increase activity slowly   Complete by: As directed       Allergies as of 02/19/2023       Reactions   Iodinated Contrast Media Hives, Itching  Throat scratchiness mild *Pt needs to receive a 13 hour prednisone prep prior to future CT scans containing Iodinated contrast media* Patient has prednisone allergy as well!! Throat scratchiness  MRI causes severe headache, swelling,N & V,  bad sore throat and chest pain Also caused kidney swelling and pain   Penicillins Anaphylaxis, Rash   Did it involve swelling of the face/tongue/throat, SOB, or low BP? Yes Did it involve  sudden or severe rash/hives, skin peeling, or any reaction on the inside of your mouth or nose? Yes Did you need to seek medical attention at a hospital or doctor's office? Yes When did it last happen?    don't remember  If all above answers are "NO", may proceed with cephalosporin use.   Prednisone Other (See Comments)   Chest pain and headache. Bladder spasms, chest spasms, increase blood pressure   Propoxyphene Other (See Comments)   Chest pain.   Acetaminophen Other (See Comments)   Cannot take adult strength tylenol, it cause chest pain. Can take 1/4 tsp of Children's Tylenol only without any chest pain.   Benzonatate Other (See Comments)   Chest pain.   Buprenorphine Hcl Other (See Comments)   Bladder spasms   Citric Acid Hives, Swelling   Fever blisters in mouth   Clindamycin Other (See Comments)   Chest tightness   Codeine Other (See Comments)   Chest pain.   Diazepam Other (See Comments)   Chest pain.   Diclofenac Palpitations   Rapid heartbeat   Esomeprazole Other (See Comments)   Chest pain and rectal bleeding.   Morphine And Related Other (See Comments)   Bladder spasms   Nsaids Other (See Comments)   Bladder spasms, chest pain   Pantoprazole Sodium Other (See Comments)   Chest spasms   Albuterol And Levalbuterol Other (See Comments)   Chest tightness and angina per patient (1 puff as needed)   Ceftin [cefuroxime Axetil] Other (See Comments)   Chills & shakes Cannot urinate   Cephalexin Other (See Comments)   Unknown   Cortisone Other (See Comments)   unknown reaction   Doxycycline Other (See Comments)   Chest pain  Bladder spasm   Hydrocodone-acetaminophen    Chest pain.   Ibuprofen Other (See Comments)   Chest pain  Cannot urinary   Loracarbef Other (See Comments)   All over   Macrobid [nitrofurantoin Monohyd Macro] Other (See Comments)   Numbness in mouth   Maxalt [rizatriptan]    Methylprednisolone Sodium Succ Other (See Comments)   unknown  reaction   Naproxen    Chest pain   Omeprazole Other (See Comments)   Chest Tightness Bladder spasm   Oxycodone-acetaminophen    Chest pain. (incontinence)   Phenazopyridine Hcl Other (See Comments)   Unknown reaction   Tomato Hives   Budesonide Other (See Comments)   fatigued   Diltiazem Palpitations   Rapid Heart beat   Lidocaine Rash    Cream-Burning & rash Chest pain  Bladder spasm        Medication List     TAKE these medications    albuterol 108 (90 Base) MCG/ACT inhaler Commonly known as: VENTOLIN HFA Inhale 1-2 puffs into the lungs every 6 (six) hours as needed for wheezing or shortness of breath.   amLODipine 5 MG tablet Commonly known as: NORVASC Take 5 mg by mouth daily.   aspirin EC 81 MG tablet Take 81 mg by mouth 2 (two) times a week. Swallow whole   budesonide-formoterol 80-4.5 MCG/ACT inhaler  Commonly known as: Symbicort Inhale 1 puff into the lungs in the morning and at bedtime.   cyanocobalamin 1000 MCG tablet Commonly known as: VITAMIN B12 Take 1,000 mcg by mouth every 7 (seven) days.   cyclobenzaprine 5 MG tablet Commonly known as: FLEXERIL Take 1 tablet (5 mg total) by mouth 3 (three) times daily as needed for muscle spasms. Must be Flexeril   DRY EYES OP Place 1 drop into both eyes daily as needed (Dry eyes). Saline   isosorbide mononitrate 60 MG 24 hr tablet Commonly known as: IMDUR Take 60 mg by mouth in the morning.   isosorbide mononitrate 30 MG 24 hr tablet Commonly known as: IMDUR Take 30 mg by mouth at bedtime.   metoprolol succinate 25 MG 24 hr tablet Commonly known as: TOPROL-XL Take 12.5 mg by mouth daily.   nitroGLYCERIN 0.4 MG SL tablet Commonly known as: NITROSTAT Place 0.4 mg under the tongue every 5 (five) minutes x 3 doses as needed for chest pain.               Discharge Care Instructions  (From admission, onward)           Start     Ordered   02/19/23 0000  If the dressing is still on your  incision site when you go home, remove it on the third day after your surgery date. Remove dressing if it begins to fall off, or if it is dirty or damaged before the third day.        02/19/23 1029            Follow-up Information     Julio Sicks, MD Follow up on 02/28/2023.   Specialty: Neurosurgery Why: First post op appointment is on 02/28/2023 at 3:30 PM. Contact information: [address] [place] [address] [phone]                 Signed: Val Eagle, DNP, AGNP-C Nurse Practitioner  [facility] [address], [address], [place], [place] [address] P: [phone]    F: [phone]  02/19/2023, 10:31 AM

## 2023-02-19 NOTE — Plan of Care (Signed)
Pt doing well. Pt given D/C instructions with verbal understanding. Rx was sent to the pharmacy by MD. Pt's incision is clean and dry with no sign of infection. Pt's IV was removed prior to D/C. Pt D/C'd home via wheelchair per MD order. Pt is stable @ D/C and has no other needs at this time. Berton Butrick, RN  

## 2023-02-19 NOTE — Evaluation (Signed)
Occupational Therapy Evaluation Patient Details Name: Virginia Shaw MRN: [id number] DOB: 1966/04/10 Today's Date: 02/19/2023   History of Present Illness Virginia Shaw  57 y.o. female who is s/p Anterior Cervical Decompression Fusion - Cervical four-Cervical five 4/15. PMHx: abnormal involuntary movements, anxiety, chronic headaches, fatty liver, GERN, IBS, low BP, osteoporosis, PVD, stroke, TIA   Clinical Impression   Virginia Shaw was evaluated s/p the above spine surgery. She is indep at baseline. Upon evaluation she was limited by new RUE weakness with limited AROM, decreased activity tolerance, pain and spinal precautions. Overall she was mod I for transfers, steps and mobility and generalized superivsion A for ADLs. Provided cues and education on spinal precautions and compensatory techniques throughout, handout provided and pt demonstrated good recall. Pt does not require further acute OT services. Recommend d/c home with support of family.        Recommendations for follow up therapy are one component of a multi-disciplinary discharge planning process, led by the attending physician.  Recommendations may be updated based on patient status, additional functional criteria and insurance authorization.   Assistance Recommended at Discharge Intermittent Supervision/Assistance  Patient can return home with the following A little help with walking and/or transfers;A little help with bathing/dressing/bathroom;Assistance with cooking/housework;Assist for transportation;Help with stairs or ramp for entrance    Functional Status Assessment  Patient has had a recent decline in their functional status and demonstrates the ability to make significant improvements in function in a reasonable and predictable amount of time.  Equipment Recommendations  None recommended by OT       Precautions / Restrictions Precautions Precautions: Fall;Cervical Precaution Booklet Issued: Yes  (comment) Required Braces or Orthoses: Cervical Brace Cervical Brace: Soft collar Restrictions Weight Bearing Restrictions: No      Mobility Bed Mobility Overal bed mobility: Needs Assistance Bed Mobility: Rolling, Sidelying to Sit Rolling: Modified independent (Device/Increase time) Sidelying to sit: Modified independent (Device/Increase time)            Transfers Overall transfer level: Needs assistance Equipment used: None Transfers: Sit to/from Stand Sit to Stand: Modified independent (Device/Increase time)                  Balance Overall balance assessment: No apparent balance deficits (not formally assessed)                                         ADL either performed or assessed with clinical judgement   ADL Overall ADL's : Needs assistance/impaired Eating/Feeding: Set up;Sitting   Grooming: Supervision/safety;Standing   Upper Body Bathing: Supervision/ safety;Sitting   Lower Body Bathing: Minimal assistance;Sit to/from stand   Upper Body Dressing : Set up;Sitting   Lower Body Dressing: Supervision/safety;Sit to/from stand   Toilet Transfer: Supervision/safety;Ambulation   Toileting- Clothing Manipulation and Hygiene: Supervision/safety;Sitting/lateral lean       Functional mobility during ADLs: Supervision/safety General ADL Comments: limited by decreased AROM of RUE - no AD     Vision Baseline Vision/History: 0 No visual deficits Vision Assessment?: No apparent visual deficits     Perception Perception Perception Tested?: No   Praxis Praxis Praxis tested?: Within functional limits    Pertinent Vitals/Pain Pain Assessment Pain Assessment: Faces Faces Pain Scale: Hurts little more Pain Location: neck Pain Descriptors / Indicators: Discomfort, Guarding Pain Intervention(s): Limited activity within patient's tolerance, Monitored during session     Hand Dominance Right  Extremity/Trunk Assessment Upper  Extremity Assessment Upper Extremity Assessment: RUE deficits/detail RUE Deficits / Details: elbow, wrist and had are [facility]. limited AROM at shoulder. Pt denies paraesthesias but endorses that her arm feels weak and "heavy" RUE Sensation: WNL RUE Coordination: decreased gross motor   Lower Extremity Assessment Lower Extremity Assessment: Overall WFL for tasks assessed   Cervical / Trunk Assessment Cervical / Trunk Assessment: Neck Surgery   Communication Communication Communication: No difficulties   Cognition Arousal/Alertness: Awake/alert Behavior During Therapy: WFL for tasks assessed/performed, Anxious Overall Cognitive Status: Within Functional Limits for tasks assessed                                 General Comments: good recall and adherence to cervical precautions     General Comments  VSS on RA - pt reports RUE symptoms are new            Home Living Family/patient expects to be discharged to:: Private residence Living Arrangements: Alone (dtr staying with pt) Available Help at Discharge: Family;Available 24 hours/day Type of Home: House Home Access: Stairs to enter [employer] of Steps: 3 Entrance Stairs-Rails: None Home Layout: One level     Bathroom Shower/Tub: Chief Strategy Officer: Standard     Home Equipment: None          Prior Functioning/Environment Prior Level of Function : Independent/Modified Independent;Driving             Mobility Comments: no AD ADLs Comments: indep ADL/IADL does not work        OT Problem List: Decreased range of motion;Decreased activity tolerance;Decreased safety awareness;Decreased knowledge of precautions      OT Treatment/Interventions:      OT Goals(Current goals can be found in the care plan section) Acute Rehab OT Goals Patient Stated Goal: home OT Goal Formulation: With patient Time For Goal Achievement: 02/19/23 Potential to Achieve Goals: Good   AM-PAC OT  "6 Clicks" Daily Activity     Outcome Measure Help from another person eating meals?: None Help from another person taking care of personal grooming?: A Little Help from another person toileting, which includes using toliet, bedpan, or urinal?: A Little Help from another person bathing (including washing, rinsing, drying)?: A Little Help from another person to put on and taking off regular upper body clothing?: None Help from another person to put on and taking off regular lower body clothing?: A Little 6 Click Score: 20   End of Session Nurse Communication: Mobility status  Activity Tolerance: Patient tolerated treatment well Patient left: in bed;with call bell/phone within reach  OT Visit Diagnosis: Unsteadiness on feet (R26.81);Other abnormalities of gait and mobility (R26.89);Muscle weakness (generalized) (M62.81)                Time: 4098-1191 OT Time Calculation (min): 19 min Charges:  OT General Charges $OT Visit: 1 Visit OT Evaluation $OT Eval Moderate Complexity: 1 Mod  Derenda Mis, OTR/L Acute Rehabilitation Services Office [phone] Secure Chat Communication Preferred   Donia Pounds 02/19/2023, 10:13 AM

## 2023-03-07 ENCOUNTER — Ambulatory Visit: Payer: Medicaid Other | Admitting: Physician Assistant

## 2023-03-21 ENCOUNTER — Telehealth: Payer: Self-pay | Admitting: Student

## 2023-03-21 ENCOUNTER — Encounter: Payer: Self-pay | Admitting: Nurse Practitioner

## 2023-03-21 ENCOUNTER — Ambulatory Visit: Payer: Medicaid Other | Admitting: Nurse Practitioner

## 2023-03-21 VITALS — BP 144/78 | HR 84 | Temp 98.2°F | Ht 60.0 in | Wt 139.0 lb

## 2023-03-21 DIAGNOSIS — J4530 Mild persistent asthma, uncomplicated: Secondary | ICD-10-CM | POA: Diagnosis not present

## 2023-03-21 MED ORDER — BENZONATATE 100 MG PO CAPS
100.0000 mg | ORAL_CAPSULE | Freq: Three times a day (TID) | ORAL | 1 refills | Status: DC | PRN
Start: 2023-03-21 — End: 2023-09-16

## 2023-03-21 MED ORDER — FLUTICASONE-SALMETEROL 45-21 MCG/ACT IN AERO
1.0000 | INHALATION_SPRAY | Freq: Two times a day (BID) | RESPIRATORY_TRACT | 5 refills | Status: DC
Start: 2023-03-21 — End: 2023-06-18

## 2023-03-21 MED ORDER — LEVALBUTEROL TARTRATE 45 MCG/ACT IN AERO
1.0000 | INHALATION_SPRAY | Freq: Four times a day (QID) | RESPIRATORY_TRACT | 2 refills | Status: DC | PRN
Start: 2023-03-21 — End: 2023-12-20

## 2023-03-21 NOTE — Patient Instructions (Addendum)
-  Levalbuterol 1-2 puffs every 6 hours as needed for shortness of breath, cough, or wheezing. This will replace your albuterol as a rescue inhaler  -Try low dose Advair 1 puff Twice daily. Brush tongue and rinse mouth afterwards -Benzonatate 1 capsule Three times a day as needed for cough   Follow up in 6 weeks with Dr. Isaiah Serge or Katie Mattew Chriswell,NP. If symptoms do not improve or worsen, please contact office for sooner follow up or seek emergency care.

## 2023-03-21 NOTE — Assessment & Plan Note (Signed)
Mild reactive airway disease. Does not appear to be in acute exacerbation. Lung exam clear today. Slowly improving post op; likely a component of deconditioning. Wells score low probability for PE. She has numerous drug reactions. She did try Symbicort 160 mcg previously and felt like it made her heart race. She does receive benefit from SABA use but this also makes her heart race. We will re-trial her on ICS/LABA therapy with low dose Advair 1 puff Twice daily and change her back to xopenex to see if she tolerates this better. Action plan in place. If no improvement, we will obtain further workup with imaging/labs.   Patient Instructions  -Levalbuterol 1-2 puffs every 6 hours as needed for shortness of breath, cough, or wheezing. This will replace your albuterol as a rescue inhaler  -Try low dose Advair 1 puff Twice daily. Brush tongue and rinse mouth afterwards -Benzonatate 1 capsule Three times a day as needed for cough   Follow up in 6 weeks with Dr. Isaiah Serge or Katie Meeyah Ovitt,NP. If symptoms do not improve or worsen, please contact office for sooner follow up or seek emergency care.

## 2023-03-21 NOTE — Telephone Encounter (Signed)
Pt states the inhalers are avail at [facility] in [place] [phone]

## 2023-03-21 NOTE — Telephone Encounter (Signed)
PT calling because of the three RX's we sent over. One they did not get the RX from [place] (Zenopenex)  and one was out ( Advair)  at the Pharm, one was not covered by insurance.(benzonatate (TESSALON) 100 MG capsule [[id number]] ).  I asked if PT called around to see who did have it, she said no.  Please try new Pharm: [facility] [place] on [address]  Also, she has some Simbacort 160-4.5 she wonders if this is stronger or weaker than the one Ms. Cobb advised she take today.  Pls call PT @ [phone]    She will try to call new Pharm ([facility] [place]) to see if they have meds she needs today. If not she will call back to give [place] one that does

## 2023-03-21 NOTE — Progress Notes (Signed)
@Patient  ID: Virginia Shaw, female    DOB: 25-Oct-1966, 57 y.o.   MRN: [id number]  Chief Complaint  Patient presents with   Follow-up    Sob, after surgery.     Referring provider: Merri Brunette, MD  HPI: 57 year old female, never smoker followed for recurrent hemoptysis.  She is a patient Dr. Shirlee More and last seen in office 01/23/2023 by [facility] NP.  She is followed by rheumatology for chronic inflammatory arthritis. Past medical history significant for HTN, venous insufficiency, dysphagia, IBS, DDD, benign pituitary adenoma, recurrent UTIs, Chiari.   TEST/EVENTS:  05/17/2020 CT chest without contrast: There is a tiny calcified granuloma in the left lower lobe that is stable.  There are no suspicious pulmonary nodules or mass.  Lungs are overall unremarkable.  There are tiny lesions in the left kidney which are stable since 2015, consistent with benign etiology.  There is also no evidence of acute process or chronic process in pelvis to explain hematuria. 04/12/2022 HRCT chest: Mild bland scarring at the bases.  No ILD.  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    08/02/2022: OV with Dr. Isaiah Serge. Previous worked up for hemoptysis. She had bronchoscopy in July 2023. No evidence of abnormality. She has not had any more episodes of hemoptysis.  Complains of intermittent dyspnea and wheezing.  May have a component of reactive airway disease given intermittent wheezing.  Will start her on Symbicort inhaler.  She was prescribed Singulair at last visit but she wanted to avoid it due to  side effects.  Advised her to take over-the-counter Zyrtec or Claritin for allergic tinnitus.  01/23/2023: OV with Evalyn Shultis NP for surgical clearance.  She is anticipated to have ACDF with Dr. Jordan Likes.  Date TBD.  She has been doing okay since she was here last.  Feeling relatively stable.  She still gets dyspneic with more strenuous activity.  She also notices some occasional wheezing.  She did try the Symbicort after her last visit but felt like it gave her palpitations.  She was not sure if this was due to the steroid or not.  She has been using her Ventolin for rescue purposes, which tends to help.  Using it a few times a week.  This does make her heart race a little bit but it goes away relatively quickly.  She denies any recurrent episodes of hemoptysis, chest congestion, cough.  03/21/2023: Today - follow up Patient presents today for follow-up.  She had her ACDF 02/18/2023 with Dr. Dutch Quint after her last visit.  Feels like she is recovering relatively well from this.  She does still have some numbness and tingling in her right upper extremity, which this that is likely related to nerve impingement.  The pain in her neck is better.  She has felt a little more short winded when she exerts herself ever since her surgery.  She does feel like it is slowly getting better but she was using her rescue inhaler a little bit more.  She did have a day couple weeks ago where she had to use it multiple times that day.  Since then has seemed to calm down a bit.  Cough is dry.  Does notice  some occasional wheezing.  No episodes of hemoptysis.  Denies any fevers, chills, lower extremity swelling, calf pain, orthopnea. No difficulties swallowing or hoarse voice. She is not using the lower dosed Symbicort prescribed last time.   Allergies  Allergen Reactions   Iodinated Contrast Media Hives and Itching    Throat scratchiness mild *Pt needs to receive a 13 hour prednisone prep prior to future CT scans containing Iodinated  contrast media* Patient has prednisone allergy as well!! Throat scratchiness   MRI causes severe headache, swelling,N & V,  bad sore throat and chest pain  Also caused kidney swelling and pain   Penicillins Anaphylaxis and Rash    Did it involve swelling of the face/tongue/throat, SOB, or low BP? Yes Did it involve sudden or severe rash/hives, skin peeling, or any reaction on the inside of your mouth or nose? Yes Did you need to seek medical attention at a hospital or doctor's office? Yes When did it last happen?    don't remember  If all above answers are "NO", may proceed with cephalosporin use.    Prednisone Other (See Comments)    Chest pain and headache. Bladder spasms, chest spasms, increase blood pressure    Propoxyphene Other (See Comments)    Chest pain.     Acetaminophen Other (See Comments)    Cannot take adult strength tylenol, it cause chest pain. Can take 1/4 tsp of Children's Tylenol only without any chest pain.   Benzonatate Other (See Comments)    Chest pain.   Buprenorphine Hcl Other (See Comments)    Bladder spasms   Citric Acid Hives and Swelling    Fever blisters in mouth   Clindamycin Other (See Comments)    Chest tightness   Codeine Other (See Comments)    Chest pain.   Diazepam Other (See Comments)    Chest pain.   Diclofenac Palpitations    Rapid heartbeat   Esomeprazole Other (See Comments)    Chest pain and rectal bleeding.   Morphine And Codeine Other (See Comments)    Bladder spasms    Nsaids Other (See Comments)    Bladder spasms, chest pain    Pantoprazole Sodium Other (See Comments)    Chest spasms    Albuterol And Levalbuterol Other (See Comments)    Chest tightness and angina per patient (1 puff as needed)   Ceftin [Cefuroxime Axetil] Other (See Comments)    Chills & shakes Cannot urinate   Cephalexin Other (See Comments)    Unknown   Cortisone Other (See Comments)    unknown reaction   Doxycycline Other (See Comments)     Chest pain  Bladder spasm   Hydrocodone-Acetaminophen     Chest pain.   Ibuprofen Other (See Comments)    Chest pain  Cannot urinary   Loracarbef Other (See Comments)    All over   Macrobid [Nitrofurantoin Monohyd Macro] Other (See Comments)    Numbness in mouth   Maxalt [Rizatriptan]    Methylprednisolone Sodium Succ Other (See Comments)    unknown reaction   Naproxen     Chest pain   Omeprazole Other (See Comments)    Chest Tightness Bladder spasm   Oxycodone-Acetaminophen     Chest pain. (incontinence)   Phenazopyridine Hcl Other (See Comments)    Unknown reaction   Tomato Hives   Budesonide Other (See Comments)    fatigued   Diltiazem Palpitations    Rapid Heart beat   Lidocaine Rash     Cream-Burning &  rash Chest pain  Bladder spasm    Immunization History  Administered Date(s) Administered   Tdap 10/08/2018    Past Medical History:  Diagnosis Date   Abnormal involuntary movements(781.0)    Allergic rhinitis, cause unspecified    Anxiety state, unspecified    Cervical spondylosis without myelopathy    Chronic headaches    Disturbance of skin sensation    Fatty liver    per patient, "not sure but does remember being told"   GERD (gastroesophageal reflux disease)    History of chicken pox    Irritable bowel syndrome    Low blood pressure    Lumbago    Myalgia and myositis, unspecified    Osteoporosis    Other and unspecified angina pectoris    Other malaise and fatigue    Peripheral vascular disease (HCC)    per patient "diagnosed with it about 13-14 years ago and does not currently see someone for it, saw Dr. 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   no coronary spasm per cardiology notes 2024   Shortness of breath on exertion    per patient, "sometimes depends on distance"   Sinus arrhythmia    per patient   Somatization disorder    Spasm of muscle    Stroke (HCC) 09/11/2022    Systemic sclerosis (HCC)    TIA (transient ischemic attack)    Unspecified hereditary and idiopathic peripheral neuropathy     Tobacco History: Social History   Tobacco Use  Smoking Status Never   Passive exposure: Past  Smokeless Tobacco Never   Counseling given: Not Answered   Outpatient Medications Prior to Visit  Medication Sig Dispense Refill   amLODipine (NORVASC) 5 MG tablet Take 5 mg by mouth daily.      Artificial Tear Ointment (DRY EYES OP) Place 1 drop into both eyes daily as needed (Dry eyes). Saline     aspirin EC 81 MG tablet Take 81 mg by mouth 2 (two) times a week. Swallow whole     cyclobenzaprine (FLEXERIL) 5 MG tablet Take 1 tablet (5 mg total) by mouth 3 (three) times daily as needed for muscle spasms. Must be Flexeril 30 tablet 0   isosorbide mononitrate (IMDUR) 30 MG 24 hr tablet Take 30 mg by mouth at bedtime.     isosorbide mononitrate (IMDUR) 60 MG 24 hr tablet Take 60 mg by mouth in the morning.     metoprolol succinate (TOPROL-XL) 25 MG 24 hr tablet Take 12.5 mg by mouth daily.     nitroGLYCERIN (NITROSTAT) 0.4 MG SL tablet Place 0.4 mg under the tongue every 5 (five) minutes x 3 doses as needed for chest pain.      vitamin B-12 (CYANOCOBALAMIN) 1000 MCG tablet Take 1,000 mcg by mouth every 7 (seven) days.     albuterol (VENTOLIN HFA) 108 (90 Base) MCG/ACT inhaler Inhale 1-2 puffs into the lungs every 6 (six) hours as needed for wheezing or shortness of breath.     budesonide-formoterol (SYMBICORT) 80-4.5 MCG/ACT inhaler Inhale 1 puff into the lungs in the morning and at bedtime. (Patient not taking: Reported on 02/13/2023) 1 each 5   No facility-administered medications prior to visit.     Review of Systems:   Constitutional: No weight loss or gain, night sweats, fevers, chills, fatigue, or lassitude. HEENT: No headaches, difficulty swallowing, tooth/dental problems, or sore throat. No sneezing, itching, ear ache. +occasional nasal  congestion/drainage  CV:  No chest pain, orthopnea, PND, swelling in lower extremities, anasarca, dizziness, palpitations, syncope Resp: +shortness of breath with exertion; occasional chest tightness; occasional wheeze; dry cough. No excess mucus or change in color of mucus. No hemoptysis. No chest wall deformity GI:  No heartburn, indigestion, abdominal pain, nausea, vomiting, diarrhea, change in bowel habits, loss of appetite, bloody stools.  Skin: No rash, lesions, ulcerations MSK:  No joint pain or swelling. Neuro: No gait abnormality. +numbness/tingling RUE Psych: No depression or anxiety. Mood stable.     Physical Exam:  BP (!) 144/78   Pulse 84   Temp 98.2 F (36.8 C) (Oral)   Ht 5' (1.524 m)   Wt 139 lb (63 kg)   SpO2 98%   BMI 27.15 kg/m   GEN: Pleasant, interactive, well-appearing; in no acute distress. HEENT:  Normocephalic and atraumatic. PERRLA. Sclera white. Nasal turbinates erythematous, moist and patent bilaterally. No rhinorrhea present. Oropharynx pink and moist, without exudate or edema. No lesions, ulcerations, or postnasal drip.  NECK:  Supple w/ fair ROM. No JVD present. Normal carotid impulses w/o bruits. Thyroid symmetrical with no goiter or nodules palpated. No lymphadenopathy.   CV: RRR, no m/r/g, no peripheral edema. Pulses intact, +2 bilaterally. No cyanosis, pallor or clubbing. PULMONARY:  Unlabored, regular breathing. No stridor. Clear bilaterally A&P w/o wheezes/rales/rhonchi. No accessory muscle use. No dullness to percussion. GI: BS present and normoactive. Soft, non-tender to palpation.  MSK: No erythema, warmth or tenderness. Cap refil <2 sec all extrem. No deformities or joint swelling noted.  Neuro: A/Ox3. No focal deficits noted.   Skin: Warm, no lesions or rashe Psych: Normal affect and behavior. Judgement and thought content appropriate.     Lab Results:  CBC    Component Value Date/Time   WBC 8.2 02/15/2023 0855   RBC 4.90 02/15/2023  0855   HGB 15.0 02/15/2023 0855   HCT 45.1 02/15/2023 0855   PLT 385 02/15/2023 0855   MCV 92.0 02/15/2023 0855   MCH 30.6 02/15/2023 0855   MCHC 33.3 02/15/2023 0855   RDW 12.4 02/15/2023 0855   LYMPHSABS 2.2 01/01/2022 1627   MONOABS 0.6 01/01/2022 1627   EOSABS 0.5 01/01/2022 1627   BASOSABS 0.1 01/01/2022 1627    BMET    Component Value Date/Time   NA 140 02/15/2023 0855   NA 137 08/25/2014 1140   K 4.4 02/15/2023 0855   CL 105 02/15/2023 0855   CO2 27 02/15/2023 0855   GLUCOSE 106 (H) 02/15/2023 0855   BUN 11 02/15/2023 0855   BUN 13 08/25/2014 1140   CREATININE 0.76 02/15/2023 0855   CALCIUM 9.4 02/15/2023 0855   GFRNONAA >60 02/15/2023 0855   GFRAA >60 04/21/2020 1404    BNP    Component Value Date/Time   BNP 22.5 01/01/2022 1629     Imaging:  No results found.       Latest Ref Rng & Units 02/16/2022    8:41 AM  PFT Results  FVC-Pre L 3.02   FVC-Predicted Pre % 99   FVC-Post L 3.12   FVC-Predicted Post % 102   Pre FEV1/FVC % % 76   Post FEV1/FCV % % 80   FEV1-Pre L 2.30   FEV1-Predicted Pre % 96   FEV1-Post L 2.49   DLCO uncorrected ml/min/mmHg 18.63   DLCO UNC% % 101   DLCO corrected ml/min/mmHg 18.46   DLCO COR %Predicted % 100   DLVA Predicted % 89   TLC L 5.42  TLC % Predicted % 119   RV % Predicted % 139     No results found for: "NITRICOXIDE"      Assessment & Plan:   Reactive airway disease Mild reactive airway disease. Does not appear to be in acute exacerbation. Lung exam clear today. Slowly improving post op; likely a component of deconditioning. Wells score low probability for PE. She has numerous drug reactions. She did try Symbicort 160 mcg previously and felt like it made her heart race. She does receive benefit from SABA use but this also makes her heart race. We will re-trial her on ICS/LABA therapy with low dose Advair 1 puff Twice daily and change her back to xopenex to see if she tolerates this better. Action plan  in place. If no improvement, we will obtain further workup with imaging/labs.   Patient Instructions  -Levalbuterol 1-2 puffs every 6 hours as needed for shortness of breath, cough, or wheezing. This will replace your albuterol as a rescue inhaler  -Try low dose Advair 1 puff Twice daily. Brush tongue and rinse mouth afterwards -Benzonatate 1 capsule Three times a day as needed for cough   Follow up in 6 weeks with Dr. Isaiah Serge or Katie Almas Rake,NP. If symptoms do not improve or worsen, please contact office for sooner follow up or seek emergency care.    I spent 32 minutes of dedicated to the care of this patient on the date of this encounter to include pre-visit review of records, face-to-face time with the patient discussing conditions above, post visit ordering of testing, clinical documentation with the electronic health record, making appropriate referrals as documented, and communicating necessary findings to members of the patients care team.  Noemi Chapel, NP 03/21/2023  Pt aware and understands NP's role.

## 2023-03-27 MED ORDER — PREDNISONE 20 MG PO TABS
20.0000 mg | ORAL_TABLET | Freq: Every day | ORAL | 0 refills | Status: AC
Start: 2023-03-27 — End: 2023-04-01

## 2023-03-27 NOTE — Telephone Encounter (Signed)
I've sent 20 mg daily for 5 days. Take in AM with food. Thanks

## 2023-03-27 NOTE — Telephone Encounter (Signed)
Called and spoke with pt who states that the inhalers that were sent to the pharmacy for her are too expensive for her and she was wanting to know if there was anything OTC that she could take instead.  Stated to pt that there is really nothing OTC that is similar to the inhalers that were prescribed. I stated to pt that we could check with our pharmacy team to see if there was anything that was covered by her insurance that was similar to the Advair [facility] inhaler that was prescribed.  After stating that to pt, she then stated that since Advair HFA had steroids in it, instead of currently having another inhaler prescribed, she wanted to know if there was any way she could have an Rx for very low dose of prednisone sent in for her.  Pt said she usually cannot tolerate steroids but she does want to try a very low dose of this for now.  Katie, please advise.  **Please route back to triage pool and not directly back to me as I'm currently up front on the phones**

## 2023-03-27 NOTE — Telephone Encounter (Signed)
Spoke with patient. Advised prednisone script has been sent to pharmacy. NFN

## 2023-04-02 ENCOUNTER — Telehealth: Payer: Self-pay | Admitting: Nurse Practitioner

## 2023-04-02 NOTE — Telephone Encounter (Signed)
Pt states the Prednisone prescribed has been affecting her kidneys and heart rate but improved her breathing & SOB, wants to know should she continue taking prescribed medicine ?

## 2023-04-02 NOTE — Telephone Encounter (Signed)
Called and spoke with patient. She stated that she was prescribed prednisone 20mg  x5 days. She ended up halfing the prescription and has been taking 10mg  once daily. Even with the half dose, she has noticed that her urination has been increasely more painful. She has developed mouth sores. She has noticed that her SOB improved once she first started the prednisone but it is starting to come back.   She wanted to know if it would be ok for her to continue the prednisone at 10mg  once daily if it would be better for her to  break the half into 5mg .   Virginia Shaw, can you please advise? Thanks!

## 2023-04-03 NOTE — Telephone Encounter (Signed)
She can try breaking it into 5 mg tablets for 7 days then stop. Thanks.

## 2023-04-03 NOTE — Telephone Encounter (Signed)
Called and spoke with patient. She verbalized understanding.   Nothing further needed at time of call.  

## 2023-05-08 ENCOUNTER — Ambulatory Visit: Payer: Medicaid Other | Admitting: Nurse Practitioner

## 2023-05-15 ENCOUNTER — Ambulatory Visit: Payer: Medicaid Other | Admitting: Pulmonary Disease

## 2023-06-18 ENCOUNTER — Encounter: Payer: Self-pay | Admitting: Nurse Practitioner

## 2023-06-18 ENCOUNTER — Ambulatory Visit: Payer: Medicaid Other | Admitting: Nurse Practitioner

## 2023-06-18 VITALS — BP 110/70 | HR 78 | Ht 60.0 in | Wt 139.6 lb

## 2023-06-18 DIAGNOSIS — J4530 Mild persistent asthma, uncomplicated: Secondary | ICD-10-CM | POA: Diagnosis not present

## 2023-06-18 DIAGNOSIS — J019 Acute sinusitis, unspecified: Secondary | ICD-10-CM | POA: Diagnosis not present

## 2023-06-18 MED ORDER — FLUTICASONE PROPIONATE 50 MCG/ACT NA SUSP: 1.0000 | Freq: Every day | NASAL | 2 refills | Status: AC

## 2023-06-18 MED ORDER — FLUTICASONE-SALMETEROL 45-21 MCG/ACT IN AERO
1.0000 | INHALATION_SPRAY | Freq: Two times a day (BID) | RESPIRATORY_TRACT | 5 refills | Status: DC
Start: 2023-06-18 — End: 2023-09-16

## 2023-06-18 NOTE — Patient Instructions (Addendum)
-  Continue levalbuterol 1-2 puffs every 6 hours as needed for shortness of breath, cough, or wheezing.  -Start Advair 1 puff Twice daily. Brush tongue and rinse mouth afterwards. I will send this back to your pharmacy   Guaifenesin over the counter every 4-6 hours for congestion Saline nasal rinses 1-2 times a day with bottled, distilled water. Follow 20-30 minutes later with flonase nasal spray 1 spray each nostril once a day   Follow up in 6 weeks with Dr. Isaiah Serge or Katie ,NP. If symptoms do not improve or worsen, please contact office for sooner follow up or seek emergency care.

## 2023-06-18 NOTE — Progress Notes (Signed)
@Patient  ID: Virginia Shaw, female    DOB: [birth year], 57 y.o.   MRN: [id number]  Chief Complaint  Patient presents with   Follow-up    Pt states her cough has gotten better after prednisone. Pt still is experiencing sob     Referring provider: Merri Brunette, MD  HPI: 57 year old female, never smoker followed for recurrent hemoptysis and reactive airway disease.  She is a patient Dr. Shirlee More and last seen in office 03/21/2023 by [facility] NP.  She is followed by rheumatology for chronic inflammatory arthritis. Past medical history significant for HTN, venous insufficiency, dysphagia, IBS, DDD, benign pituitary adenoma, recurrent UTIs, Chiari.   TEST/EVENTS:  05/17/2020 CT chest without contrast: There is a tiny calcified granuloma in the left lower lobe that is stable.  There are no suspicious pulmonary nodules or mass.  Lungs are overall unremarkable.  There are tiny lesions in the left kidney which are stable since 2015, consistent with benign etiology.  There is also no evidence of acute process or chronic process in pelvis to explain hematuria. 02/16/2022 PFT: FVC 99, FEV1 96, ratio 80, TLC 119, DLCOcor 100  04/12/2022 HRCT chest: Mild bland scarring at the bases.  No ILD. 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    08/02/2022: OV with Dr. Isaiah Serge. Previous worked up for hemoptysis. She had bronchoscopy in July 2023. No evidence of abnormality. She has not had any more episodes of hemoptysis.  Complains of intermittent dyspnea and wheezing.  May  have a component of reactive airway disease given intermittent wheezing.  Will start her on Symbicort inhaler.  She was prescribed Singulair at last visit but she wanted to avoid it due to side effects.  Advised her to take over-the-counter Zyrtec or Claritin for allergic tinnitus.  01/23/2023: OV with  NP for surgical clearance.  She is anticipated to have ACDF with Dr. Jordan Likes.  Date TBD.  She has been doing okay since she was here last.  Feeling relatively stable.  She still gets dyspneic with more strenuous activity.  She also notices some occasional wheezing.  She did try the Symbicort after her last visit but felt like it gave her palpitations.  She was not sure if this was due to the steroid or not.  She has been using her Ventolin for rescue purposes, which tends to help.  Using it a few times a week.  This does make her heart race a little bit but it goes away relatively quickly.  She denies any recurrent episodes of hemoptysis, chest congestion, cough.  03/21/2023: OV with  NP for follow-up.  She had her ACDF 02/18/2023 with Dr. Dutch Quint after her last visit.  Feels like she is recovering relatively well from this.  She does still have some numbness and tingling in her right upper extremity, which this that is likely related to nerve impingement.  The pain in her neck is better.  She has felt a little more short winded when she exerts herself ever since her surgery.  She does feel like it is slowly getting better but she was using her rescue inhaler a little bit more.  She did have a day  couple weeks ago where she had to use it multiple times that day.  Since then has seemed to calm down a bit.  Cough is dry.  Does notice some occasional wheezing.  No episodes of hemoptysis.  Denies any fevers, chills, lower extremity swelling, calf pain, orthopnea. No difficulties swallowing or hoarse voice. She is not using the lower dosed Symbicort prescribed last time.   06/18/2023: Today - follow up Patient  presents today for follow up. Her cough is gone since our last visit. She still has some shortness of breath with exertion. Her breathing felt better on the prednisone but it caused her to have palpitations even with 5 mg so she didn't finish the taper. Rare wheeze. She never received the Advair; had issues with her pharmacy. Using the ventolin a few times a week, which helps. No further episodes of hemoptysis since April 2024, which was one minimal occurrence. She is having some increased sinus congestion with clear drainage and occasional headaches that started Saturday. Mild facial tenderness under her eyes. No fevers, chills, chest tightness, leg swelling, orthopnea, sore throat, ear ache. She did not complete any viral testing. Eating and drinking normally.   Allergies  Allergen Reactions   Iodinated Contrast Media Hives and Itching    Throat scratchiness mild *Pt needs to receive a 13 hour prednisone prep prior to future CT scans containing Iodinated contrast media* Patient has prednisone allergy as well!! Throat scratchiness   MRI causes severe headache, swelling,N & V,  bad sore throat and chest pain  Also caused kidney swelling and pain   Penicillins Anaphylaxis and Rash    Did it involve swelling of the face/tongue/throat, SOB, or low BP? Yes Did it involve sudden or severe rash/hives, skin peeling, or any reaction on the inside of your mouth or nose? Yes Did you need to seek medical attention at a hospital or doctor's office? Yes When did it last happen?    don't remember  If all above answers are "NO", may proceed with cephalosporin use.    Prednisone Other (See Comments)    Chest pain and headache. Bladder spasms, chest spasms, increase blood pressure    Propoxyphene Other (See Comments)    Chest pain.     Acetaminophen Other (See Comments)    Cannot take adult strength tylenol, it cause chest pain. Can take 1/4 tsp of Children's Tylenol only without any chest pain.    Benzonatate Other (See Comments)    Chest pain.   Buprenorphine Hcl Other (See Comments)    Bladder spasms   Citric Acid Hives and Swelling    Fever blisters in mouth   Clindamycin Other (See Comments)    Chest tightness   Codeine Other (See Comments)    Chest pain.   Diazepam Other (See Comments)    Chest pain.   Diclofenac Palpitations    Rapid heartbeat   Esomeprazole Other (See Comments)    Chest pain and rectal bleeding.   Morphine And Codeine Other (See Comments)    Bladder spasms    Nsaids Other (See Comments)    Bladder spasms, chest pain    Pantoprazole Sodium Other (See Comments)    Chest spasms    Albuterol And Levalbuterol Other (See Comments)    Chest tightness and angina per patient (1 puff as needed)   Ceftin [Cefuroxime Axetil] Other (See Comments)    Chills & shakes Cannot urinate   Cephalexin Other (See Comments)    Unknown   Cortisone Other (See  Comments)    unknown reaction   Doxycycline Other (See Comments)    Chest pain  Bladder spasm   Hydrocodone-Acetaminophen     Chest pain.   Ibuprofen Other (See Comments)    Chest pain  Cannot urinary   Loracarbef Other (See Comments)    All over   Macrobid [Nitrofurantoin Monohyd Macro] Other (See Comments)    Numbness in mouth   Maxalt [Rizatriptan]    Methylprednisolone Sodium Succ Other (See Comments)    unknown reaction   Naproxen     Chest pain   Omeprazole Other (See Comments)    Chest Tightness Bladder spasm   Oxycodone-Acetaminophen     Chest pain. (incontinence)   Phenazopyridine Hcl Other (See Comments)    Unknown reaction   Tomato Hives   Budesonide Other (See Comments)    fatigued   Diltiazem Palpitations    Rapid Heart beat   Lidocaine Rash     Cream-Burning & rash Chest pain  Bladder spasm    Immunization History  Administered Date(s) Administered   Tdap 10/08/2018    Past Medical History:  Diagnosis Date   Abnormal involuntary movements(781.0)    Allergic rhinitis,  cause unspecified    Anxiety state, unspecified    Cervical spondylosis without myelopathy    Chronic headaches    Disturbance of skin sensation    Fatty liver    per patient, "not sure but does remember being told"   GERD (gastroesophageal reflux disease)    History of chicken pox    Irritable bowel syndrome    Low blood pressure    Lumbago    Myalgia and myositis, unspecified    Osteoporosis    Other and unspecified angina pectoris    Other malaise and fatigue    Peripheral vascular disease (HCC)    per patient "diagnosed with it about 13-14 years ago and does not currently see someone for it, saw Dr. 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   no coronary spasm per cardiology notes 2024   Shortness of breath on exertion    per patient, "sometimes depends on distance"   Sinus arrhythmia    per patient   Somatization disorder    Spasm of muscle    Stroke (HCC) 09/11/2022   Systemic sclerosis (HCC)    TIA (transient ischemic attack)    Unspecified hereditary and idiopathic peripheral neuropathy     Tobacco History: Social History   Tobacco Use  Smoking Status Never   Passive exposure: Past  Smokeless Tobacco Never   Counseling given: Not Answered   Outpatient Medications Prior to Visit  Medication Sig Dispense Refill   amLODipine (NORVASC) 5 MG tablet Take 5 mg by mouth daily.      Artificial Tear Ointment (DRY EYES OP) Place 1 drop into both eyes daily as needed (Dry eyes). Saline     aspirin EC 81 MG tablet Take 81 mg by mouth 2 (two) times a week. Swallow whole     isosorbide mononitrate (IMDUR) 30 MG 24 hr tablet Take 30 mg by mouth at bedtime.     isosorbide mononitrate (IMDUR) 60 MG 24 hr tablet Take 60 mg by mouth in the morning.     levalbuterol (XOPENEX HFA) 45 MCG/ACT inhaler Inhale 1-2 puffs into the lungs every 6 (six) hours as needed for wheezing or shortness of breath. 1 each 2    metoprolol succinate (TOPROL-XL) 25 MG 24  hr tablet Take 12.5 mg by mouth daily.     nitroGLYCERIN (NITROSTAT) 0.4 MG SL tablet Place 0.4 mg under the tongue every 5 (five) minutes x 3 doses as needed for chest pain.      vitamin B-12 (CYANOCOBALAMIN) 1000 MCG tablet Take 1,000 mcg by mouth every 7 (seven) days.     fluticasone-salmeterol (ADVAIR HFA) 45-21 MCG/ACT inhaler Inhale 1 puff into the lungs 2 (two) times daily. 1 each 5   benzonatate (TESSALON) 100 MG capsule Take 1 capsule (100 mg total) by mouth 3 (three) times daily as needed for cough. (Patient not taking: Reported on 06/18/2023) 30 capsule 1   cyclobenzaprine (FLEXERIL) 5 MG tablet Take 1 tablet (5 mg total) by mouth 3 (three) times daily as needed for muscle spasms. Must be Flexeril (Patient not taking: Reported on 06/18/2023) 30 tablet 0   No facility-administered medications prior to visit.     Review of Systems:   Constitutional: No weight loss or gain, night sweats, fevers, chills, fatigue, or lassitude. HEENT: No difficulty swallowing, tooth/dental problems, or sore throat. No sneezing, itching, ear ache. +nasal congestion/drainage, headaches CV:  No chest pain, orthopnea, PND, swelling in lower extremities, anasarca, dizziness, palpitations, syncope Resp: +shortness of breath with exertion; rare wheeze. No excess mucus or change in color of mucus. No hemoptysis. No chest wall deformity GI:  No heartburn, indigestion, abdominal pain, nausea, vomiting, diarrhea, change in bowel habits, loss of appetite, bloody stools.  Skin: No rash, lesions, ulcerations MSK:  No joint pain or swelling. Neuro: No gait abnormality.  Psych: No depression or anxiety. Mood stable.     Physical Exam:  BP 110/70   Pulse 78   Ht 5' (1.524 m)   Wt 139 lb 9.6 oz (63.3 kg)   SpO2 98%   BMI 27.26 kg/m   GEN: Pleasant, interactive, well-appearing; in no acute distress. HEENT:  Normocephalic and atraumatic. PERRLA. Sclera white. Nasal  turbinates erythematous, moist and patent bilaterally. No rhinorrhea present. Oropharynx pink and moist, without exudate or edema. No lesions, ulcerations, or postnasal drip.  NECK:  Supple w/ fair ROM. No JVD present. Normal carotid impulses w/o bruits. Thyroid symmetrical with no goiter or nodules palpated. No lymphadenopathy.   CV: RRR, no m/r/g, no peripheral edema. Pulses intact, +2 bilaterally. No cyanosis, pallor or clubbing. PULMONARY:  Unlabored, regular breathing. No stridor. Clear bilaterally A&P w/o wheezes/rales/rhonchi. No accessory muscle use. No dullness to percussion. GI: BS present and normoactive. Soft, non-tender to palpation.  MSK: No erythema, warmth or tenderness. Cap refil <2 sec all extrem. No deformities or joint swelling noted.  Neuro: A/Ox3. No focal deficits noted.   Skin: Warm, no lesions or rashe Psych: Normal affect and behavior. Judgement and thought content appropriate.     Lab Results:  CBC    Component Value Date/Time   WBC 8.2 02/15/2023 0855   RBC 4.90 02/15/2023 0855   HGB 15.0 02/15/2023 0855   HCT 45.1 02/15/2023 0855   PLT 385 02/15/2023 0855   MCV 92.0 02/15/2023 0855   MCH 30.6 02/15/2023 0855   MCHC 33.3 02/15/2023 0855   RDW 12.4 02/15/2023 0855   LYMPHSABS 2.2 01/01/2022 1627   MONOABS 0.6 01/01/2022 1627   EOSABS 0.5 01/01/2022 1627   BASOSABS 0.1 01/01/2022 1627    BMET    Component Value Date/Time   NA 140 02/15/2023 0855   NA 137 08/25/2014 1140   K 4.4 02/15/2023 0855   CL 105 02/15/2023 0855  CO2 27 02/15/2023 0855   GLUCOSE 106 (H) 02/15/2023 0855   BUN 11 02/15/2023 0855   BUN 13 08/25/2014 1140   CREATININE 0.76 02/15/2023 0855   CALCIUM 9.4 02/15/2023 0855   GFRNONAA >60 02/15/2023 0855   GFRAA >60 04/21/2020 1404    BNP    Component Value Date/Time   BNP 22.5 01/01/2022 1629     Imaging:  No results found.  Administration History     None          Latest Ref Rng & Units 02/16/2022    8:41 AM   PFT Results  FVC-Pre L 3.02   FVC-Predicted Pre % 99   FVC-Post L 3.12   FVC-Predicted Post % 102   Pre FEV1/FVC % % 76   Post FEV1/FCV % % 80   FEV1-Pre L 2.30   FEV1-Predicted Pre % 96   FEV1-Post L 2.49   DLCO uncorrected ml/min/mmHg 18.63   DLCO UNC% % 101   DLCO corrected ml/min/mmHg 18.46   DLCO COR %Predicted % 100   DLVA Predicted % 89   TLC L 5.42   TLC % Predicted % 119   RV % Predicted % 139     No results found for: "NITRICOXIDE"      Assessment & Plan:   Reactive airway disease She does not appear to be in acute exacerbation. Cough resolved. Improvement with steroids and SABA use but difficulties tolerating systemic steroids due to side effects. Unsure why she was unable to get Advair after our last visit; sounds like a pharmacy mishap. Will resend orders to her new pharmacy today for low dose ICS/LABA with decreased dosing due to side effects. Understands proper use/medication profile. Action plan in place.   Patient Instructions  -Continue levalbuterol 1-2 puffs every 6 hours as needed for shortness of breath, cough, or wheezing.  -Start Advair 1 puff Twice daily. Brush tongue and rinse mouth afterwards. I will send this back to your pharmacy   Guaifenesin over the counter every 4-6 hours for congestion Saline nasal rinses 1-2 times a day with bottled, distilled water. Follow 20-30 minutes later with flonase nasal spray 1 spray each nostril once a day   Follow up in 6 weeks with Dr. Isaiah Serge or Katie ,NP. If symptoms do not improve or worsen, please contact office for sooner follow up or seek emergency care.   Acute sinusitis Likely viral in nature. Declined COVID testing. Supportive care measures advised. She will notify if symptoms worsen or do not improve over the next 7-10 days.     I spent 32 minutes of dedicated to the care of this patient on the date of this encounter to include pre-visit review of records, face-to-face time with the patient  discussing conditions above, post visit ordering of testing, clinical documentation with the electronic health record, making appropriate referrals as documented, and communicating necessary findings to members of the patients care team.  Noemi Chapel, NP 06/18/2023  Pt aware and understands NP's role.

## 2023-06-18 NOTE — Assessment & Plan Note (Signed)
She does not appear to be in acute exacerbation. Cough resolved. Improvement with steroids and SABA use but difficulties tolerating systemic steroids due to side effects. Unsure why she was unable to get Advair after our last visit; sounds like a pharmacy mishap. Will resend orders to her new pharmacy today for low dose ICS/LABA with decreased dosing due to side effects. Understands proper use/medication profile. Action plan in place.   Patient Instructions  -Continue levalbuterol 1-2 puffs every 6 hours as needed for shortness of breath, cough, or wheezing.  -Start Advair 1 puff Twice daily. Brush tongue and rinse mouth afterwards. I will send this back to your pharmacy   Guaifenesin over the counter every 4-6 hours for congestion Saline nasal rinses 1-2 times a day with bottled, distilled water. Follow 20-30 minutes later with flonase nasal spray 1 spray each nostril once a day   Follow up in 6 weeks with Dr. Isaiah Serge or Katie ,NP. If symptoms do not improve or worsen, please contact office for sooner follow up or seek emergency care.

## 2023-06-18 NOTE — Assessment & Plan Note (Signed)
Likely viral in nature. Declined COVID testing. Supportive care measures advised. She will notify if symptoms worsen or do not improve over the next 7-10 days.

## 2023-07-09 HISTORY — PX: OTHER SURGICAL HISTORY: SHX169

## 2023-07-15 ENCOUNTER — Ambulatory Visit: Payer: Medicaid Other | Admitting: Pulmonary Disease

## 2023-07-24 ENCOUNTER — Other Ambulatory Visit: Payer: Self-pay | Admitting: Family Medicine

## 2023-07-24 DIAGNOSIS — R1032 Left lower quadrant pain: Secondary | ICD-10-CM

## 2023-08-07 ENCOUNTER — Ambulatory Visit
Admission: RE | Admit: 2023-08-07 | Discharge: 2023-08-07 | Disposition: A | Discharge status: Home / Self Care | Payer: Medicaid Other | Source: Ambulatory Visit | Attending: Family Medicine | Admitting: Family Medicine

## 2023-08-07 DIAGNOSIS — R1032 Left lower quadrant pain: Secondary | ICD-10-CM

## 2023-08-20 NOTE — Progress Notes (Unsigned)
Office Visit Note  Patient: Virginia Shaw             Date of Birth: 1965/12/22           MRN: [id number]             PCP: Merri Brunette, MD Referring: Merri Brunette, MD Visit Date: 09/03/2023 Occupation: @GUAROCC @  Subjective:  Pain in both hands   History of Present Illness: Virginia Shaw is a 57 y.o. female with history of fibromyalgia, osteoarthritis, and DDD.  Patient presents today with increased pain in both hands.  Patient states that with the weather change she has not exacerbation of symptoms.  She has noticed intermittent swelling as well as stiffness in both hands.  She is also experiencing paresthesias intermittently.  Patient had left carpal tunnel release surgery in the past but has had recurrence of symptoms so she does not plan on having right carpal tunnel release in the future.  She has been wearing arthritis compression gloves but has not been using carpal tunnel splints recently.  Patient continues to experience intermittent myofascial pain due to fibromyalgia.  She has intermittent bouts of fatigue.  She continues to have interrupted sleep at night.  She has been having ongoing discomfort in her neck, lower back, and left hip.  She is apprehensive to proceed with surgical intervention.  She has tried physical therapy in the past and has continued home exercises.  Activities of Daily Living:  Patient reports morning stiffness for 5-10 minutes.   Patient Reports nocturnal pain.  Difficulty dressing/grooming: Denies Difficulty climbing stairs: Reports Difficulty getting out of chair: Denies Difficulty using hands for taps, buttons, cutlery, and/or writing: Reports  Review of Systems  Constitutional:  Negative for fatigue.  HENT:  Negative for mouth sores and mouth dryness.   Eyes:  Positive for dryness.  Respiratory:  Negative for wheezing.   Cardiovascular:  Positive for palpitations.  Gastrointestinal:  Negative for blood in stool, constipation and  diarrhea.  Endocrine: Positive for increased urination.  Genitourinary:  Positive for involuntary urination.  Musculoskeletal:  Positive for joint pain, gait problem, joint pain, joint swelling, myalgias, muscle weakness, morning stiffness, muscle tenderness and myalgias.  Skin:  Positive for rash and hair loss. Negative for color change and sensitivity to sunlight.  Allergic/Immunologic: Negative for susceptible to infections.  Neurological:  Positive for dizziness and headaches.  Hematological:  Negative for swollen glands.  Psychiatric/Behavioral:  Positive for sleep disturbance. Negative for depressed mood. The patient is not nervous/anxious.     PMFS History:  Patient Active Problem List   Diagnosis Date Noted   Acute sinusitis 06/18/2023   Cervical spondylosis with myelopathy and radiculopathy 02/18/2023   Reactive airway disease 01/23/2023   Preoperative respiratory examination 01/23/2023   Hemoptysis 01/05/2022   Special screening for malignant neoplasms, colon 03/29/2020   Dysphagia 12/22/2019   DDD (degenerative disc disease), cervical 08/11/2018   DDD (degenerative disc disease), lumbar 08/11/2018   Somatic complaints, multiple 03/14/2017   Chronic pain syndrome 08/25/2014   Irritable bowel syndrome (IBS) 01/12/2014   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HCC) 01/11/2014   Chronic abdominal pain 01/11/2014   Chronic venous insufficiency 10/30/2013   Varicose veins 10/30/2013   Chest tightness 03/25/2012   PITUITARY ADENOMA, 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   RECTAL BLEEDING 08/26/2008   UTI'S, RECURRENT 08/26/2008    Past Medical History:  Diagnosis Date   Abnormal involuntary movements(781.0)    Allergic rhinitis, cause unspecified    Anxiety state, unspecified  Cervical spondylosis without myelopathy    Chronic headaches    Disturbance of skin sensation    Fatty liver    per patient, "not sure but does remember being told"   GERD (gastroesophageal reflux disease)    History of chicken  pox    Irritable bowel syndrome    Low blood pressure    Lumbago    Myalgia and myositis, unspecified    Osteoporosis    Other and unspecified angina pectoris    Other malaise and fatigue    Peripheral vascular disease (HCC)    per patient "diagnosed with it about 13-14 years ago and does not currently see someone for it, saw Dr. 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   no coronary spasm per cardiology notes 2024   Shortness of breath on exertion    per patient, "sometimes depends on distance"   Sinus arrhythmia    per patient   Somatization disorder    Spasm of muscle    Stroke (HCC) 09/11/2022   Systemic sclerosis (HCC)    TIA (transient ischemic attack)    Unspecified hereditary and idiopathic peripheral neuropathy     Family History  Problem Relation Age of Onset   Hypertension Father    Hyperlipidemia Father    Diabetes Father    Stroke Father    Cancer Maternal Uncle        lung   Cancer Paternal Aunt        lung   Migraines Mother    Heart attack Paternal Grandmother    Heart attack Paternal Grandfather    Hypertension Paternal Grandfather    Migraines Sister    Past Surgical History:  Procedure Laterality Date   ABDOMINAL HYSTERECTOMY  1997   ANTERIOR CERVICAL DECOMP/DISCECTOMY FUSION N/A 02/18/2023   Procedure: Anterior Cervical Decompression Fusion - Cervical four-Cervical five;  Surgeon: Julio Sicks, MD;  Location: [facility] OR;  Service: Neurosurgery;  Laterality: N/A;  3C   arnold chiari malformation  2013   BACK SURGERY  2001, 2011,2013   Lumbar x 2   BALLOON DILATION N/A 12/22/2019   Procedure: BALLOON DILATION;  Surgeon: Charlott Rakes, MD;  Location: [facility];  Service: Endoscopy;  Laterality: N/A;   barium swallow test  10/2019   bladder prolapse surgery  07/09/2023   at [facility] in [place] per patient   BLADDER SURGERY  2011   CARDIAC CATHETERIZATION      CARPAL TUNNEL RELEASE Left    CATARACT EXTRACTION     CERVICAL SPINE SURGERY  2013   CHOLECYSTECTOMY     COLONOSCOPY  2009   "Normal" Dr Jarold Motto   COLONOSCOPY  2002   "Normal" Dr Arnaldo Natal   COLONOSCOPY  ~2013   Dr Noe Gens.  "Chest pain with the prep" = aborted   COLONOSCOPY WITH PROPOFOL N/A 03/29/2020   Procedure: COLONOSCOPY WITH PROPOFOL;  Surgeon: Charlott Rakes, MD;  Location: [facility];  Service: Endoscopy;  Laterality: N/A;   CRANIECTOMY SUBOCCIPITAL W/ CERVICAL LAMINECTOMY / CHIARI  2012   Dr Jordan Likes, Chiari decompression with suboccipital craniectomy and   CYSTOCELE REPAIR  2002   Dr Henderson Cloud   ESOPHAGOGASTRODUODENOSCOPY (EGD) WITH PROPOFOL N/A 12/22/2019   Procedure: ESOPHAGOGASTRODUODENOSCOPY (EGD) WITH PROPOFOL;  Surgeon: Charlott Rakes, MD;  Location: [facility];  Service: Endoscopy;  Laterality: N/A;   INSERTION OF MESH Bilateral 10/04/2020   Procedure: INSERTION OF MESH;  Surgeon: Axel Filler, MD;  Location: [facility] OR;  Service: General;  Laterality: Bilateral;   OOPHORECTOMY  1999, 2000   PITUITARY EXCISION  2010   pituitary cyst removed   RECTOCELE REPAIR  2002   Dr. Henderson Cloud   RENAL BIOPSY  08/04/2019   tear duct tubes  2005   TUBAL LIGATION     VIDEO BRONCHOSCOPY N/A 05/14/2022   Procedure: VIDEO BRONCHOSCOPY WITHOUT FLUORO;  Surgeon: Chilton Greathouse, MD;  Location: [facility];  Service: Cardiopulmonary;  Laterality: N/A;   XI ROBOTIC ASSISTED INGUINAL HERNIA REPAIR WITH MESH Bilateral 10/04/2020   Procedure: XI ROBOTIC ASSISTED BILATERAL INGUINAL HERNIA REPAIR WITH MESH;  Surgeon: Axel Filler, MD;  Location[facility];  Service: General;  Laterality: Bilateral;   Social History   Social History Narrative   Lives at home with children. ( 2 daughters)   Regular exercise-yes   Caffeine Use-no   GED   Not Working outside home   Immunization History  Administered Date(s) Administered   Tdap 10/08/2018     Objective: Vital Signs: BP 107/73  (BP Location: Left Arm, Patient Position: Sitting, Cuff Size: Normal)   Pulse 77   Resp 15   Ht 5' (1.524 m)   Wt 138 lb (62.6 kg)   BMI 26.95 kg/m    Physical Exam Vitals and nursing note reviewed.  Constitutional:      Appearance: She is well-developed.  HENT:     Head: Normocephalic and atraumatic.  Eyes:     Conjunctiva/sclera: Conjunctivae normal.  Cardiovascular:     Rate and Rhythm: Normal rate and regular rhythm.     Heart sounds: Normal heart sounds.  Pulmonary:     Effort: Pulmonary effort is normal.     Breath sounds: Normal breath sounds.  Abdominal:     General: Bowel sounds are normal.     Palpations: Abdomen is soft.  Musculoskeletal:     Cervical back: Normal range of motion.  Lymphadenopathy:     Cervical: No cervical adenopathy.  Skin:    General: Skin is warm and dry.     Capillary Refill: Capillary refill takes less than 2 seconds.  Neurological:     Mental Status: She is alert and oriented to person, place, and time.  Psychiatric:        Behavior: Behavior normal.      Musculoskeletal Exam: Generalized hyperalgesia and positive tender points on exam.  C-spine has limited range of motion with lateral rotation.  Left trapezius muscle tension tenderness bilaterally.  Shoulder joints, elbow joints, wrist joints, MCPs, PIPs, DIPs have good range of motion with no synovitis.  PIP and DIP thickening consistent with osteoarthritis of both hands.  Complete fist formation bilaterally.  Painful and limited Rom of the left hip.  Right hip has good ROM with no groin pain.  Knee joints have good range of motion no warmth or effusion.  Ankle joints have good range of motion with no tenderness or joint swelling.  CDAI Exam: CDAI Score: -- Patient Global: --; Provider Global: -- Swollen: --; Tender: -- Joint Exam 09/03/2023   No joint exam has been documented for this visit   There is currently no information documented on the homunculus. Go to the Rheumatology  activity and complete the homunculus joint exam.  Investigation: No additional findings.  Imaging: CT ABDOMEN PELVIS WO CONTRAST  Result Date: 08/11/2023 CLINICAL DATA:  Left lower quadrant pain. Recent bladder prolapse repair. EXAM: CT ABDOMEN AND PELVIS WITHOUT CONTRAST TECHNIQUE: Multidetector CT imaging of the abdomen and  pelvis was performed following the standard protocol without IV contrast. RADIATION DOSE REDUCTION: This exam was performed according to the departmental dose-optimization program which includes automated exposure control, adjustment of the mA and/or kV according to patient size and/or use of iterative reconstruction technique. COMPARISON:  07/13/2023 FINDINGS: Lower chest: No acute findings. Hepatobiliary: No mass visualized on this unenhanced exam. Prior cholecystectomy. No evidence of biliary obstruction. Pancreas: No mass or inflammatory process visualized on this unenhanced exam. Spleen:  Within normal limits in size. Adrenals/Urinary tract: No evidence of urolithiasis or hydronephrosis. Unremarkable unopacified urinary bladder. Stomach/Bowel: No evidence of obstruction, inflammatory process, or abnormal fluid collections. Normal appendix visualized. Mild sigmoid diverticulosis noted. No evidence of diverticulitis. Vascular/Lymphatic: No pathologically enlarged lymph nodes identified. No evidence of abdominal aortic aneurysm. Reproductive: Prior hysterectomy noted. Adnexal regions are unremarkable in appearance. Other:  None. Musculoskeletal:  No suspicious bone lesions identified. IMPRESSION: No acute findings. Mild sigmoid diverticulosis. No radiographic evidence of diverticulitis. Electronically Signed   By: Danae Orleans M.D.   On: 08/11/2023 15:31    Recent Labs: Lab Results  Component Value Date   WBC 8.2 02/15/2023   HGB 15.0 02/15/2023   PLT 385 02/15/2023   NA 140 02/15/2023   K 4.4 02/15/2023   CL 105 02/15/2023   CO2 27 02/15/2023   GLUCOSE 106 (H) 02/15/2023    BUN 11 02/15/2023   CREATININE 0.76 02/15/2023   BILITOT 0.4 01/01/2022   ALKPHOS 61 01/01/2022   AST 22 01/01/2022   ALT 23 01/01/2022   PROT 7.5 01/01/2022   ALBUMIN 4.1 01/01/2022   CALCIUM 9.4 02/15/2023   GFRAA >60 04/21/2020    Speciality Comments: No specialty comments available.  Procedures:  No procedures performed Allergies: Iodinated contrast media, Penicillins, Prednisone, Propoxyphene, Acetaminophen, Benzonatate, Buprenorphine hcl, Citric acid, Clindamycin, Codeine, Diazepam, Diclofenac, Esomeprazole, Morphine and codeine, Nsaids, Pantoprazole sodium, Albuterol and levalbuterol, Ceftin [cefuroxime axetil], Cephalexin, Cortisone, Doxycycline, Hydrocodone-acetaminophen, Ibuprofen, Loracarbef, Macrobid [nitrofurantoin monohyd macro], Maxalt [rizatriptan], Methylprednisolone sodium succ, Naproxen, Omeprazole, Oxycodone-acetaminophen, Phenazopyridine hcl, Tomato, Budesonide, Diltiazem, and Lidocaine   Assessment / Plan:     Visit Diagnoses: Fibromyalgia: Patient has generalized hyperalgesia and positive tender points on exam.  Patient continues to experience myofascial pain, fatigue, nocturnal pain.  Discussed the importance of regular exercise and good sleep hygiene. She will follow up in the office in 1 year or sooner if needed.   Primary osteoarthritis of both hands - RF negative, anti-CCP negative, 14 3 3  eta positive: Patient presents today with increased pain and stiffness in both hands.  She has noticed intermittent joint swelling over the past few weeks.  On examination she has tenderness upon palpation but no obvious synovitis noted.  She was able to make a complete fist bilaterally.  She has tenderness over both wrist joints.  Offered to update lab work including sed rate, CRP, rheumatoid factor, anti-CCP.  Also offered to schedule an ultrasound of both hands to assess for synovitis.  She would like to hold off on further intervention at this time and will notify us if she  develops any new or worsening symptoms.  Patient plans on using arthritis compression gloves.   Trigger finger, left middle finger - Declined cortisone inj in the past.  Bilateral carpal tunnel syndrome - Nerve conduction study performed at [facility].  Underwent carpal tunnel release left wrist 2022-Dr. Xu--recurrence of symptoms.  Patient decided against proceeding with right carpal tunnel release due to not having success in the left hand.   Trochanteric  bursitis, left hip: Chronic pain.  She has been experiencing increased discomfort in her groin.  Patient has been evaluated by orthopedics but was apprehensive to proceed with surgical intervention.  She has tried physical therapy in the past.  She has declined cortisone injections due to intolerance.  DDD (degenerative disc disease), cervical: C-spine has limited range of motion.  Left trapezius muscle tension tenderness bilaterally.  Discussed the use of a heating pad, topical agents, and massage.  Pain in thoracic spine: She is not experiencing any increased thoracic pain at this time.  Radiculopathy, lumbar region - Followed by Dr. Christell Constant.  X-rays of the lumbar spine were obtained in January 2024.  Patient has tried physical therapy in the past.  She does not require surgical intervention at this time.  Chronic pain syndrome  Other medical conditions are listed as follows:  Coronary artery spasm (HCC)  History of gastroesophageal reflux (GERD)  History of IBS  Pituitary adenoma (HCC)  Chronic venous insufficiency  History of ovarian cyst  Orders: No orders of the defined types were placed in this encounter.  No orders of the defined types were placed in this encounter.   Follow-Up Instructions: Return in about 1 year (around 09/02/2024) for Fibromyalgia, Osteoarthritis, DDD.   Gearldine Bienenstock, PA-C  Note - This record has been created using Dragon software.  Chart creation errors have been sought, but may not always  have  been located. Such creation errors do not reflect on  the standard of medical care.

## 2023-08-26 ENCOUNTER — Ambulatory Visit: Payer: Medicaid Other | Admitting: Physician Assistant

## 2023-09-03 ENCOUNTER — Encounter: Payer: Self-pay | Admitting: Physician Assistant

## 2023-09-03 ENCOUNTER — Ambulatory Visit: Payer: Medicaid Other | Attending: Physician Assistant | Admitting: Physician Assistant

## 2023-09-03 VITALS — BP 107/73 | HR 77 | Resp 15 | Ht 60.0 in | Wt 138.0 lb

## 2023-09-03 DIAGNOSIS — G5603 Carpal tunnel syndrome, bilateral upper limbs: Secondary | ICD-10-CM | POA: Diagnosis not present

## 2023-09-03 DIAGNOSIS — I201 Angina pectoris with documented spasm: Secondary | ICD-10-CM

## 2023-09-03 DIAGNOSIS — G894 Chronic pain syndrome: Secondary | ICD-10-CM

## 2023-09-03 DIAGNOSIS — M7062 Trochanteric bursitis, left hip: Secondary | ICD-10-CM

## 2023-09-03 DIAGNOSIS — M5030 Other cervical disc degeneration, unspecified cervical region: Secondary | ICD-10-CM

## 2023-09-03 DIAGNOSIS — M65332 Trigger finger, left middle finger: Secondary | ICD-10-CM | POA: Diagnosis not present

## 2023-09-03 DIAGNOSIS — M546 Pain in thoracic spine: Secondary | ICD-10-CM

## 2023-09-03 DIAGNOSIS — M19042 Primary osteoarthritis, left hand: Secondary | ICD-10-CM

## 2023-09-03 DIAGNOSIS — M19041 Primary osteoarthritis, right hand: Secondary | ICD-10-CM | POA: Diagnosis not present

## 2023-09-03 DIAGNOSIS — Z8719 Personal history of other diseases of the digestive system: Secondary | ICD-10-CM

## 2023-09-03 DIAGNOSIS — D352 Benign neoplasm of pituitary gland: Secondary | ICD-10-CM

## 2023-09-03 DIAGNOSIS — M797 Fibromyalgia: Secondary | ICD-10-CM | POA: Diagnosis not present

## 2023-09-03 DIAGNOSIS — I872 Venous insufficiency (chronic) (peripheral): Secondary | ICD-10-CM

## 2023-09-03 DIAGNOSIS — Z8742 Personal history of other diseases of the female genital tract: Secondary | ICD-10-CM

## 2023-09-03 DIAGNOSIS — M5416 Radiculopathy, lumbar region: Secondary | ICD-10-CM

## 2023-09-16 ENCOUNTER — Encounter: Payer: Self-pay | Admitting: Pulmonary Disease

## 2023-09-16 ENCOUNTER — Ambulatory Visit: Payer: Medicaid Other | Admitting: Pulmonary Disease

## 2023-09-16 VITALS — BP 102/66 | HR 74 | Ht 60.0 in | Wt 136.0 lb

## 2023-09-16 DIAGNOSIS — J4541 Moderate persistent asthma with (acute) exacerbation: Secondary | ICD-10-CM | POA: Diagnosis not present

## 2023-09-16 MED ORDER — AIRSUPRA 90-80 MCG/ACT IN AERO: 2.0000 | INHALATION_SPRAY | RESPIRATORY_TRACT | 11 refills | Status: DC | PRN

## 2023-09-16 NOTE — Progress Notes (Signed)
Virginia Shaw    [id number]    24-Mar-1966  Primary Care Physician:Smith, Sonny Masters, MD  Referring Physician: Merri Brunette, MD [phone] [place],  [place] [address]  Chief complaint: Follow up for hemoptysis, asthma  HPI: 57 year old with history of angina, osteoarthritis, endometriosis, IBS, chronic neck and back pain sent here for evaluation of hemoptysis.  She has had intermittent streaks of blood in the sputum for the past year.  She had an episode of blood with clots in the sputum around May of this year.  Also noted to have proteinuria and hematuria.  Follows with urology at [facility].  She has been evaluated by Dr. Vallery Sa, nephrology.  Work-up as noted below with elevated anti-proteinase 3 antibody.    She has not had any more episodes of blood in sputum since May.  Blood is not associated with cough.  Denies any dyspnea, sputum production, fevers, chills.  History noted for sinusitis.  She has had nasal surgery in the past.  Also has intermittent episodes of GERD.  She has chronic joint pain which is thought to be secondary to osteoarthritis and degenerative disc disease.  Follows with Dr. Corliss Skains.  She does not take NSAIDs due to side effects.  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  She is not on any particular treatment from her nephrologist and is being monitored.  Bronchoscopy is recommended in 2021 but she did not want to go ahead with the procedure She had a recurrence of hemoptysis in early 2023 and saw Dr. Delton Coombes. Bronchoscopy was discussed again but did not go through that  She finally underwent bronchoscopy on 05/14/2022 for airway inspection with no evidence of any abnormality Has not had any more episodes of hemoptysis  Pets: Has a dog.  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  No current ongoing exposures.  Denies any mold, hot tub, Jacuzzi, humidifier Smoking history: Never smoker Travel history: No significant travel history Relevant family history: There is significant family history of lung disease.  Her aunt had a lung transplant.  She is not clear why the transplant was needed.. Several family members have lung cancer, mesothelioma, COPD.  Interim history: Discussed the use of AI scribe software for clinical note transcription with the patient, who gave verbal consent to proceed.  The patient, with a history of asthma and angina, presents with ongoing breathing issues. She reports that prednisone, prescribed by a nurse practitioner, improved her breathing but caused her angina to flare up and gave her headaches. She was able to tolerate a smaller dose of prednisone prescribed by her primary care physician, but still had to cut back after three days due to angina. She expresses a desire to breathe better and reduce inflammation, but is concerned about exacerbating her angina.  She has an emergency inhaler, which she uses when she can't breathe, but reports that it causes a rapid heart rate. She has tried Symbicort and Advair in the past, but both also caused a rapid heart rate. She has also tried Singulair, but stopped due to the same side effect.  Reviewed imaging and nurse practitioner, primary care notes in detail.  The patient also reports a sensation of something getting stuck in her airway,  causing difficulty breathing and a feeling of tightness in her neck. She denies any swelling in her lips or tongue.   Outpatient Encounter Medications as of 09/16/2023  Medication Sig   albuterol (VENTOLIN HFA) 108 (90 Base) MCG/ACT inhaler Inhale into the lungs as needed.   amLODipine (NORVASC) 5 MG tablet Take 5 mg by mouth daily.    Artificial Tear Ointment (DRY EYES OP) Place 1 drop into both eyes  daily as needed (Dry eyes). Saline   aspirin EC 81 MG tablet Take 81 mg by mouth 2 (two) times a week. Swallow whole   isosorbide mononitrate (IMDUR) 60 MG 24 hr tablet Take 60 mg by mouth in the morning.   nitroGLYCERIN (NITROSTAT) 0.4 MG SL tablet Place 0.4 mg under the tongue every 5 (five) minutes x 3 doses as needed for chest pain.    [DISCONTINUED] cyclobenzaprine (FLEXERIL) 5 MG tablet Take 1 tablet (5 mg total) by mouth 3 (three) times daily as needed for muscle spasms. Must be Flexeril   [DISCONTINUED] fluticasone-salmeterol (ADVAIR HFA) 45-21 MCG/ACT inhaler Inhale 1 puff into the lungs 2 (two) times daily.   [DISCONTINUED] isosorbide mononitrate (IMDUR) 30 MG 24 hr tablet Take 30 mg by mouth at bedtime.   [DISCONTINUED] metoprolol succinate (TOPROL-XL) 25 MG 24 hr tablet Take 12.5 mg by mouth daily.   [DISCONTINUED] vitamin B-12 (CYANOCOBALAMIN) 1000 MCG tablet Take 1,000 mcg by mouth every 7 (seven) days.   fluticasone (FLONASE) 50 MCG/ACT nasal spray Place 1 spray into both nostrils daily. (Patient not taking: Reported on 09/16/2023)   levalbuterol (XOPENEX HFA) 45 MCG/ACT inhaler Inhale 1-2 puffs into the lungs every 6 (six) hours as needed for wheezing or shortness of breath. (Patient not taking: Reported on 09/16/2023)   [DISCONTINUED] benzonatate (TESSALON) 100 MG capsule Take 1 capsule (100 mg total) by mouth 3 (three) times daily as needed for cough. (Patient not taking: Reported on 09/16/2023)   No facility-administered encounter medications on file as of 09/16/2023.   Physical Exam: Blood pressure 102/66, pulse 74, height 5' (1.524 m), weight 136 lb (61.7 kg), SpO2 97%. Gen:      No acute distress HEENT:  EOMI, sclera anicteric Neck:     No masses; no thyromegaly Lungs:    Clear to auscultation bilaterally; normal respiratory effort CV:         Regular rate and rhythm; no murmurs Abd:      + bowel sounds; soft, non-tender; no palpable masses, no distension Ext:    No  edema; adequate peripheral perfusion Skin:      Warm and dry; no rash Neuro: alert and oriented x 3 Psych: normal mood and affect   Data Reviewed: Imaging: Chest x-ray 07/24/2018- no active cardiopulmonary disease. CT abdomen pelvis 03/01/2019- no acute abnormality in lower chest. High-resolution CT 07/08/2019- no interstitial lung disease.  Minimal air trapping.  Aortic atherosclerosis.   CT chest 05/17/2020-no acute lung findings.  Tiny lesion in left kidney. High-resolution CT 04/12/2022-mild bland scarring at the bases.  No interstitial lung disease. Chest x-ray 01/24/2023-no acute cardiopulmonary abnormality CT abdomen pelvis 08/11/2023-visualized lungs are clear.  I have reviewed the images personally.  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  Assessment:  Hemoptysis-resolved Unclear if this is actually hemoptysis.  She has been worked up for hematuria with anti-PR-3 antibody. Kidney biopsy with no evidence of vasculitis.  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 There is also question of GERD with esophageal stricture as she is seeing a gastroenterologist for dilatation.  CT reviewed with no obvious source of hemoptysis and there are no airway abnormalities on bronchoscopy.  She has not had any recurrence of her hemoptysis  Asthma Exacerbation relieved by prednisone, but patient reports angina and headaches with prednisone use. Patient has previously tried Symbicort, Advair, and Singulair but reports palpitations  with these medications. Patient currently uses Ventolin as a rescue inhaler.  Intolerant of multiple asthma medications.  We discussed in detail use of biologic medication for asthma as she may be a candidate for it with peripheral eosinophilia as in the past but she is reluctant to try any new medication.  -Replace Ventolin with Airsupra as a rescue inhaler. -Consider intermittent use of Symbicort if tolerated. -Consider obtaining samples of Airsupra for patient to trial before filling prescription. -She is also using intermittent low-dose prednisone from primary care to help with breathing  Plan/Recommendations: Try Airsupra, intermittent low-dose prednisone Symbicort/Advair as tolerated  Chilton Greathouse MD [facility] 09/16/2023, 10:27 AM  CC: Merri Brunette, MD

## 2023-09-16 NOTE — Patient Instructions (Signed)
VISIT SUMMARY:  During today's visit, we discussed your ongoing breathing issues and the complications you are experiencing with your current asthma and angina treatments. You mentioned that while prednisone has helped with your breathing, it has also caused your angina to flare up and given you headaches. We reviewed your history with various asthma medications and their side effects, and we have made some adjustments to your treatment plan to better manage your symptoms.  YOUR PLAN:  -ASTHMA: Asthma is a condition where your airways narrow and swell, making it difficult to breathe. We will replace your current rescue inhaler, Ventolin, with Arsupra to see if it causes fewer side effects. We will also consider intermittent use of Symbicort or Adviar if you can tolerate it. Additionally, we may obtain samples of Airsupra for you to try before filling the prescription.  -ANGINA: Angina is chest pain caused by reduced blood flow to the heart. Since prednisone has been causing your angina to flare up, we will continue your current use of nitroglycerin as needed and monitor your condition closely, especially with the changes in your asthma management.  INSTRUCTIONS:  Please follow up with [place] if you experience any new or worsening symptoms. We will monitor your response to the new inhaler and any changes in your angina. If you tolerate the Arsupra well, we will proceed with a full prescription. Continue using nitroglycerin as needed for angina and keep [place] informed of any significant changes in your condition.

## 2023-09-18 ENCOUNTER — Other Ambulatory Visit (HOSPITAL_COMMUNITY): Payer: Self-pay

## 2023-09-18 ENCOUNTER — Telehealth: Payer: Self-pay

## 2023-09-18 NOTE — Telephone Encounter (Signed)
Pharmacy Patient Advocate Encounter   Received notification from CoverMyMeds that prior authorization for Airsupra 90-80MCG/ACT aerosol is required/requested.   Insurance verification completed.   The patient is insured through [facility] .   Per test claim: PA required; PA submitted to above mentioned insurance via CoverMyMeds Key/confirmation #/EOC [id number] Status is pending

## 2023-09-18 NOTE — Telephone Encounter (Signed)
Patient does not want the Airsupra. Would like refill for Albuterol inhaler. Patient out of medication. Pharmacy is [facility] [place]. Patient phone number is [phone].

## 2023-09-19 NOTE — Telephone Encounter (Signed)
Pharmacy Patient Advocate Encounter  Received notification from [facility] that Prior Authorization for Airsupra 90-80MCG/ACT aerosol has been APPROVED from 09-18-2023 to 09-17-2024   PA #/Case ID/Reference #: [id number]

## 2023-10-07 ENCOUNTER — Other Ambulatory Visit: Payer: Self-pay | Admitting: Endocrinology

## 2023-10-07 DIAGNOSIS — D443 Neoplasm of uncertain behavior of pituitary gland: Secondary | ICD-10-CM

## 2023-10-24 ENCOUNTER — Other Ambulatory Visit: Payer: Self-pay | Admitting: Endocrinology

## 2023-10-24 DIAGNOSIS — D443 Neoplasm of uncertain behavior of pituitary gland: Secondary | ICD-10-CM

## 2023-11-09 ENCOUNTER — Other Ambulatory Visit: Payer: Medicaid Other

## 2023-11-13 ENCOUNTER — Emergency Department (HOSPITAL_BASED_OUTPATIENT_CLINIC_OR_DEPARTMENT_OTHER)
Admission: EM | Admit: 2023-11-13 | Discharge: 2023-11-13 | Discharge status: Against Medical Advice | Payer: Medicaid Other | Attending: Emergency Medicine | Admitting: Emergency Medicine

## 2023-11-13 ENCOUNTER — Other Ambulatory Visit: Payer: Self-pay

## 2023-11-13 ENCOUNTER — Encounter (HOSPITAL_BASED_OUTPATIENT_CLINIC_OR_DEPARTMENT_OTHER): Payer: Self-pay | Admitting: Emergency Medicine

## 2023-11-13 ENCOUNTER — Emergency Department (HOSPITAL_BASED_OUTPATIENT_CLINIC_OR_DEPARTMENT_OTHER): Payer: Medicaid Other | Admitting: Radiology

## 2023-11-13 DIAGNOSIS — Z5321 Procedure and treatment not carried out due to patient leaving prior to being seen by health care provider: Secondary | ICD-10-CM | POA: Insufficient documentation

## 2023-11-13 DIAGNOSIS — R079 Chest pain, unspecified: Secondary | ICD-10-CM | POA: Insufficient documentation

## 2023-11-13 DIAGNOSIS — R059 Cough, unspecified: Secondary | ICD-10-CM | POA: Diagnosis not present

## 2023-11-13 DIAGNOSIS — R42 Dizziness and giddiness: Secondary | ICD-10-CM | POA: Diagnosis not present

## 2023-11-13 LAB — BASIC METABOLIC PANEL
Anion gap: 5 (ref 5–15)
BUN: 13 mg/dL (ref 6–20)
CO2: 28 mmol/L (ref 22–32)
Calcium: 9 mg/dL (ref 8.9–10.3)
Chloride: 100 mmol/L (ref 98–111)
Creatinine, Ser: 0.63 mg/dL (ref 0.44–1.00)
GFR, Estimated: >60 mL/min (ref >60)
Glucose, Bld: 91 mg/dL (ref 70–99)
Potassium: 3.9 mmol/L (ref 3.5–5.1)
Sodium: 133 mmol/L — ABNORMAL LOW (ref 135–145)

## 2023-11-13 LAB — CBC
HCT: 41.9 % (ref 36.0–46.0)
Hemoglobin: 14.2 g/dL (ref 12.0–15.0)
MCH: 30.2 pg (ref 26.0–34.0)
MCHC: 33.9 g/dL (ref 30.0–36.0)
MCV: 89.1 fL (ref 80.0–100.0)
Platelets: 322 10*3/uL (ref 150–400)
RBC: 4.7 MIL/uL (ref 3.87–5.11)
RDW: 12.1 % (ref 11.5–15.5)
WBC: 4.8 10*3/uL (ref 4.0–10.5)
nRBC: 0 % (ref 0.0–0.2)

## 2023-11-13 LAB — TROPONIN I (HIGH SENSITIVITY): Troponin I (High Sensitivity): <2 ng/L (ref <18)

## 2023-11-13 NOTE — ED Triage Notes (Signed)
 Pt caox4, ambulatory c/o CP that started last week along with feeling lightheaded intermittently. CP worsens "when walking and coughing." Pt reports productive cough with same onset as CP. Denies fever, chills, N/V.

## 2023-11-13 NOTE — ED Notes (Signed)
 Pt advised registation that she was no longer going to wait and was leaving and left. Registration staff advised nursing staff that pt left.

## 2023-11-19 NOTE — Progress Notes (Signed)
 [facility] cardiology electrophysiology office Visit  Note  PCP: Virginia ONEIDA Sharps, MD   Portion of this note were dictated using [employer]AG[employer] voices recognition software. Please disregard any errors in transcription .    Reason for Visit:      [name] is here for follow-up management recently about a week ago she gone to [facility], ER.  With shortness of breath and epigastric pain.  They work her up for ischemia they could not find anything.  Advised follow-up with her cardiologist.  EKG is normal.  She has history of shortness of breath in past even without exertion.     Assessment/Plan   1.  History of atypical chest pain.  That in past her cardiac catheter has been negative.  Also referred her to [facility].  I agree that this symptom is not related to cardiac ischemia but possible she has history of microvascular disease.  She is on isosorbide  mononitrate, metoprolol  and Norvasc .  On occasion also she complains shortness of breath and epigastric pain.  They work her up and has been discharged.  She is here today EKG shows sinus rhythm.  She is not taking any medication because blood pressure is low advised take half dose of her isosorbide .  And then if she does well she does not get dizzy she can take her Norvasc  too   2.  History of Arnold-Chiari syndrome: Advise follow-up with her neurologist   3.  Status post cardiac cath that was negative and no nonobstructive coronary artery disease.  05/29/2018, she also has had stress echo was negative.        PLAN: Reassure her and also advise follow-up with her primary physician for her epigastric pain make sure she does not have ulcer.  And continue with other cardiac management as before.  And she is going to [facility] in 6 months.     Orders   Orders Placed This Encounter  Procedures  . ECG 12 lead  . Return for APP 6 months.  Portions of this note may have been created  utilizing [employer] dictation software and may contain transcription errors that were missed during proofreading.  HPI   Virginia Shaw   returns for follow up with past medical history of recurrent chest pain and shortness of breath that they are not cardiac related.  She has undergone cardiac cath and also be secondary to [facility] in past they were agree with same finding.  But however we have been giving her Norvasc  and Imdur  and was on metoprolol .  She had done well with that.  And recently she went to ER with same complaint epigastric area pain that was not consistent with cardiac.  The initial EKG has been normal sinus rhythm with low voltage QRS.  And now she is doing well but she says still has some epigastric area advised that follow-up with her primary physician to work her up for possible ulcer or GI issue.  And advised to start on her medication that she was taking for her heart.  Low-dose of Norvasc  and also Imdur .  She was not taking them because his blood pressure was low sided.    Physical Examination:  VITAL SIGNS:  Vitals:   11/19/23 1029  BP: 100/64  Pulse: 84  SpO2: 98%   @WEIGHTVITALS @  General:  Resting comfortably in NAD Neuro:  Alert & oriented X 3.   Resp:  Resp even and nonlabored.  Lungs sounds clear throughout CV:  Regular rate and rhythm,no murmur , No gallops or rubs GI:  Soft, nontender, non-distended, NABS Ext:  No edema Neck:  No JVD Pulses:  2+ and symmetrical upper and lower extremities  Virginia Rinks, MD, MD, [facility] 11/19/2023, 6:46 PM   Past Medical History,  Past Surgical History, Family History, Social History, Medications, Allergies, Social History: As reviewed in EPIC  Review of Systems: All positive and pertinent negatives are noted in the HPI; otherwise all other systems are negative  Objective Data Reviewed During this Patient Encounter:   EKG: Sinus rhythm with rate of 76 with 12-minute, low voltage QRS and borderline EKG.  Lab  Results  Component Value Date   CHOL 184 05/23/2023   TRIG 99 05/23/2023   HDL 50 (L) 05/23/2023   Lab Results  Component Value Date   WBC 8.55 07/13/2023   HGB 14.0 07/13/2023   HCT 40.8 07/13/2023   PLT 426 07/13/2023   Lab Results  Component Value Date   NA 140 07/13/2023   K 4.2 07/13/2023   CL 105 07/13/2023   CO2 29 07/13/2023   BUN 10 07/13/2023   CREATININE 0.76 07/13/2023   AST 20 07/13/2023   ALT 16 07/13/2023      Current Outpatient Medications:  .  isosorbide  mononitrate (IMDUR ) 60 mg 24 hr tablet, Take 1 tablet (60 mg total) by mouth every morning., Disp: 90 tablet, Rfl: 3 .  lidocaine  (XYLOCAINE ) 2 % jelp, Apply 10 mL topically as needed (pain)., Disp: 1 applicator, Rfl: 0 .  nitroglycerin  (NITROSTAT ) 0.4 mg SL tablet, Place 1 tablet (0.4 mg total) under the tongue every 5 (five) minutes as needed for chest pain., Disp: 25 tablet, Rfl: 2 .  Norvasc  5 mg tablet, Take 1 tablet (5 mg total) by mouth daily., Disp: 90 tablet, Rfl: 3 .  peg 400-propylene glycol (Systane, propylene glycoL,) 0.4-0.3 % drop ophthalmic solution, Administer 1 drop into each eyes as needed., Disp: , Rfl:  .  aspirin 81 mg chewable tablet, Chew 81 mg as needed (pain). 2  tablets twice a week (Patient not taking: Reported on 11/19/2023), Disp: , Rfl:  .  conjugated estrogens (PREMARIN) 0.625 mg/gram vaginal cream, Insert 0.5 g into the vagina See Admin Instructions. Insert blueberry size amount of cream on finger (or 0.5 grams with applicator) in vagina daily x2 week then 2x per week. (Patient not taking: Reported on 11/19/2023), Disp: 42.5 g, Rfl: 3 .  isosorbide  mononitrate (IMDUR ) 30 mg 24 hr tablet, Take 1 tablet (30 mg total) by mouth nightly. (Patient not taking: Reported on 11/19/2023), Disp: 90 tablet, Rfl: 3 .  Ventolin  HFA 90 mcg/actuation inhaler, Inhale 2 puffs as needed. (Patient not taking: Reported on 11/19/2023), Disp: , Rfl:

## 2023-12-14 ENCOUNTER — Ambulatory Visit
Admission: RE | Admit: 2023-12-14 | Discharge: 2023-12-14 | Disposition: A | Discharge status: Home / Self Care | Payer: Medicaid Other | Source: Ambulatory Visit | Attending: Endocrinology | Admitting: Endocrinology

## 2023-12-14 DIAGNOSIS — D443 Neoplasm of uncertain behavior of pituitary gland: Secondary | ICD-10-CM

## 2023-12-20 ENCOUNTER — Encounter: Payer: Self-pay | Admitting: Pulmonary Disease

## 2023-12-20 ENCOUNTER — Ambulatory Visit: Payer: Medicaid Other | Admitting: Pulmonary Disease

## 2023-12-20 VITALS — BP 108/70 | HR 86 | Ht 60.0 in | Wt 134.0 lb

## 2023-12-20 DIAGNOSIS — R0789 Other chest pain: Secondary | ICD-10-CM

## 2023-12-20 DIAGNOSIS — J4541 Moderate persistent asthma with (acute) exacerbation: Secondary | ICD-10-CM

## 2023-12-20 DIAGNOSIS — J4520 Mild intermittent asthma, uncomplicated: Secondary | ICD-10-CM | POA: Diagnosis not present

## 2023-12-20 MED ORDER — ALBUTEROL SULFATE HFA 108 (90 BASE) MCG/ACT IN AERS: 2.0000 | INHALATION_SPRAY | Freq: Four times a day (QID) | RESPIRATORY_TRACT | 2 refills | Status: AC | PRN

## 2023-12-20 MED ORDER — FLUTICASONE FUROATE-VILANTEROL 100-25 MCG/ACT IN AEPB: 1.0000 | INHALATION_SPRAY | Freq: Every day | RESPIRATORY_TRACT | 11 refills | Status: AC

## 2023-12-20 NOTE — Patient Instructions (Signed)
After our discussion today we will start you on a medication called Breo inhaler Continue the albuterol as needed.  Will renew these medications Follow-up in 6 months

## 2023-12-20 NOTE — Progress Notes (Deleted)
Virginia Shaw    [id number]    11-30-1965  Primary Care Physician:Smith, Sonny Masters, MD  Referring Physician: Merri Brunette, MD [address] [place],  [place] [address]  Chief complaint: Follow up for hemoptysis, asthma  HPI: 58 y.o.  with history of angina, osteoarthritis, endometriosis, IBS, chronic neck and back pain sent here for evaluation of hemoptysis.  She has had intermittent streaks of blood in the sputum for the past year.  She had an episode of blood with clots in the sputum around May of this year.  Also noted to have proteinuria and hematuria.  Follows with urology at [facility].  She has been evaluated by Dr. Vallery Sa, nephrology.  Work-up as noted below with elevated anti-proteinase 3 antibody.    She has not had any more episodes of blood in sputum since May.  Blood is not associated with cough.  Denies any dyspnea, sputum production, fevers, chills.  History noted for sinusitis.  She has had nasal surgery in the past.  Also has intermittent episodes of GERD.  She has chronic joint pain which is thought to be secondary to osteoarthritis and degenerative disc disease.  Follows with Dr. Corliss Skains.  She does not take NSAIDs due to side effects.  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  She is not on any particular treatment from her nephrologist and is being monitored.  Bronchoscopy is recommended in 2021 but she did not want to go ahead with the procedure She had a recurrence of hemoptysis in early 2023 and saw Dr. Delton Coombes. Bronchoscopy was discussed again but did not go through that  She finally underwent bronchoscopy on 05/14/2022 for airway inspection with no evidence of any abnormality Has not had any more episodes of hemoptysis  Pets: Has a dog.  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  No current ongoing exposures.  Denies any mold, hot tub, Jacuzzi, humidifier Smoking history: Never smoker Travel history: No significant travel history Relevant family history: There is significant family history of lung disease.  Her aunt had a lung transplant.  She is not clear why the transplant was needed.. Several family members have lung cancer, mesothelioma, COPD.  Interim history: Discussed the use of AI scribe software for clinical note transcription with the patient, who gave verbal consent to proceed.  The patient, with a history of asthma and angina, presents with ongoing breathing issues. She reports that prednisone, prescribed by a nurse practitioner, improved her breathing but caused her angina to flare up and gave her headaches. She was able to tolerate a smaller dose of prednisone prescribed by her primary care physician, but still had to cut back after three days due to angina. She expresses a desire to breathe better and reduce inflammation, but is concerned about exacerbating her angina.  She has an emergency inhaler, which she uses when she can't breathe, but reports that it causes a rapid heart rate. She has tried Symbicort and Advair in the past, but both also caused a rapid heart rate. She has also tried Singulair, but stopped due to the same side effect.  Reviewed imaging and nurse practitioner, primary care notes in detail.  The patient also reports a sensation of something getting stuck in  her airway, causing difficulty breathing and a feeling of tightness in her neck. She denies any swelling in her lips or tongue.   Outpatient Encounter Medications as of 12/20/2023  Medication Sig   amLODipine (NORVASC) 5 MG tablet Take 5 mg by mouth daily.    Artificial Tear Ointment (DRY EYES OP) Place 1 drop into both eyes daily as needed (Dry eyes). Saline   aspirin EC 81 MG tablet Take 81 mg by mouth 2 (two)  times a week. Swallow whole   fluticasone (FLONASE) 50 MCG/ACT nasal spray Place 1 spray into both nostrils daily.   isosorbide mononitrate (IMDUR) 60 MG 24 hr tablet Take 60 mg by mouth in the morning.   nitroGLYCERIN (NITROSTAT) 0.4 MG SL tablet Place 0.4 mg under the tongue every 5 (five) minutes x 3 doses as needed for chest pain.    [DISCONTINUED] albuterol (VENTOLIN HFA) 108 (90 Base) MCG/ACT inhaler Inhale into the lungs as needed.   [DISCONTINUED] Albuterol-Budesonide (AIRSUPRA) 90-80 MCG/ACT AERO Inhale 2 puffs into the lungs every 4 (four) hours as needed.   [DISCONTINUED] levalbuterol (XOPENEX HFA) 45 MCG/ACT inhaler Inhale 1-2 puffs into the lungs every 6 (six) hours as needed for wheezing or shortness of breath.   No facility-administered encounter medications on file as of 12/20/2023.   Physical Exam: Blood pressure 102/66, pulse 74, height 5' (1.524 m), weight 136 lb (61.7 kg), SpO2 97%. Gen:      No acute distress HEENT:  EOMI, sclera anicteric Neck:     No masses; no thyromegaly Lungs:    Clear to auscultation bilaterally; normal respiratory effort CV:         Regular rate and rhythm; no murmurs Abd:      + bowel sounds; soft, non-tender; no palpable masses, no distension Ext:    No edema; adequate peripheral perfusion Skin:      Warm and dry; no rash Neuro: alert and oriented x 3 Psych: normal mood and affect   Data Reviewed: Imaging: Chest x-ray 07/24/2018- no active cardiopulmonary disease. CT abdomen pelvis 03/01/2019- no acute abnormality in lower chest. High-resolution CT 07/08/2019- no interstitial lung disease.  Minimal air trapping.  Aortic atherosclerosis.   CT chest 05/17/2020-no acute lung findings.  Tiny lesion in left kidney. High-resolution CT 04/12/2022-mild bland scarring at the bases.  No interstitial lung disease. Chest x-ray 01/24/2023-no acute cardiopulmonary abnormality CT abdomen pelvis 08/11/2023-visualized lungs are clear.  I have reviewed the images  personally.  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  Assessment:  Hemoptysis-resolved Unclear if this is actually hemoptysis.  She has been worked up for hematuria with anti-PR-3 antibody. Kidney biopsy with no evidence of vasculitis.  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 There is also question of GERD with esophageal stricture as she is seeing a gastroenterologist for dilatation.  CT reviewed with no obvious source of hemoptysis and there are no airway abnormalities on bronchoscopy.  She has not had any recurrence of her hemoptysis  Asthma Exacerbation relieved by prednisone, but patient reports angina and headaches with prednisone use. Patient has previously tried Symbicort, Advair, and Singulair but reports palpitations with these medications. Patient currently  uses Ventolin as a rescue inhaler.  Intolerant of multiple asthma medications.  We discussed in detail use of biologic medication for asthma as she may be a candidate for it with peripheral eosinophilia as in the past but she is reluctant to try any new medication.  -Replace Ventolin with Airsupra as a rescue inhaler. -Consider intermittent use of Symbicort if tolerated. -Consider obtaining samples of Airsupra for patient to trial before filling prescription. -She is also using intermittent low-dose prednisone from primary care to help with breathing  Plan/Recommendations: Try Airsupra, intermittent low-dose prednisone Symbicort/Advair as  tolerated  Chilton Greathouse MD [facility] 12/20/2023, 8:47 AM  CC: Merri Brunette, MD

## 2023-12-20 NOTE — Progress Notes (Signed)
 Virginia Shaw    [id number]    September 29, 1966  Primary Care Physician:Smith, Sonny Masters, MD  Referring Physician: Merri Brunette, MD [phone] [place],  [place] [address]  Chief complaint: Follow up for hemoptysis, asthma  HPI: 58 year old with history of angina, osteoarthritis, endometriosis, IBS, chronic neck and back pain sent here for evaluation of hemoptysis.  She has had intermittent streaks of blood in the sputum for the past year.  She had an episode of blood with clots in the sputum around May of this year.  Also noted to have proteinuria and hematuria.  Follows with urology at [facility].  She has been evaluated by Dr. Vallery Sa, nephrology.  Work-up as noted below with elevated anti-proteinase 3 antibody.    She has not had any more episodes of blood in sputum since May.  Blood is not associated with cough.  Denies any dyspnea, sputum production, fevers, chills.  History noted for sinusitis.  She has had nasal surgery in the past.  Also has intermittent episodes of GERD.  She has chronic joint pain which is thought to be secondary to osteoarthritis and degenerative disc disease.  Follows with Dr. Corliss Skains.  She does not take NSAIDs due to side effects.  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  She is not on any particular treatment from her nephrologist and is being monitored.  Bronchoscopy is recommended in 2021 but she did not want to go ahead with the procedure She had a recurrence of hemoptysis in early 2023 and saw Dr. Delton Coombes. Bronchoscopy was discussed again but did not go through that  She finally underwent bronchoscopy on 05/14/2022 for airway inspection with no evidence of any abnormality Has not had any more episodes of hemoptysis  Pets: Has a dog.  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  No current ongoing exposures.  Denies any mold, hot tub, Jacuzzi, humidifier Smoking history: Never smoker Travel history: No significant travel history Relevant family history: There is significant family history of lung disease.  Her aunt had a lung transplant.  She is not clear why the transplant was needed.. Several family members have lung cancer, mesothelioma, COPD.  Interim history: Discussed the use of AI scribe software for clinical note transcription with the patient, who gave verbal consent to proceed.  Virginia Shaw is a 58 year old female with asthma who presents with shortness of breath and chest pain.  She experiences intermittent chest pain, often sharp in nature, which occurs during activities such as walking. The pain is sometimes accompanied by shortness of breath and congestion. She finds relief with nitroglycerin and occasionally uses baby aspirin for associated headaches. Her cardiologist has indicated that the chest pain is atypical and not related to cardiac ischemia.  She has shortness of breath, particularly when exposed to cold weather, which she associates with her asthma. This symptom is consistent with cold weather exposure and is managed with Ventolin for symptomatic relief.  Her asthma management has been challenging due to adverse reactions to certain inhalers. She tolerates Ventolin better than other inhalers but has experienced rapid heartbeats with Symbicort and is hesitant to use inhalers containing steroids. She has not been using any inhalers regularly but has a Symbicort  inhaler at home.  We had prescribed Airsupra at last visit but she is reluctant to use it.  She is currently taking isosorbide and Norvasc, with adjustments made based on her cardiologist's advice. She reports more energy off Norvasc but increased chest tightness, leading to resuming the medication.  Reviewed notes  from cardiologist in detail on MyChart and the patient.  Outpatient Encounter Medications as of 12/20/2023  Medication Sig   amLODipine (NORVASC) 5 MG tablet Take 5 mg by mouth daily.    Artificial Tear Ointment (DRY EYES OP) Place 1 drop into both eyes daily as needed (Dry eyes). Saline   aspirin EC 81 MG tablet Take 81 mg by mouth 2 (two) times a week. Swallow whole   fluticasone (FLONASE) 50 MCG/ACT nasal spray Place 1 spray into both nostrils daily.   isosorbide mononitrate (IMDUR) 60 MG 24 hr tablet Take 60 mg by mouth in the morning.   nitroGLYCERIN (NITROSTAT) 0.4 MG SL tablet Place 0.4 mg under the tongue every 5 (five) minutes x 3 doses as needed for chest pain.    [DISCONTINUED] albuterol (VENTOLIN HFA) 108 (90 Base) MCG/ACT inhaler Inhale into the lungs as needed.   [DISCONTINUED] Albuterol-Budesonide (AIRSUPRA) 90-80 MCG/ACT AERO Inhale 2 puffs into the lungs every 4 (four) hours as needed.   [DISCONTINUED] levalbuterol (XOPENEX HFA) 45 MCG/ACT inhaler Inhale 1-2 puffs into the lungs every 6 (six) hours as needed for wheezing or shortness of breath.   No facility-administered encounter medications on file as of 12/20/2023.   Physical Exam: Blood pressure 108/70, pulse 86, height 5' (1.524 m), weight 134 lb (60.8 kg), SpO2 98%. Gen:      No acute distress HEENT:  EOMI, sclera anicteric Neck:     No masses; no thyromegaly Lungs:    Clear to auscultation bilaterally; normal respiratory effort CV:         Regular rate and rhythm; no murmurs Abd:      + bowel sounds; soft, non-tender; no palpable masses, no distension Ext:    No edema; adequate peripheral perfusion Skin:      Warm and dry; no rash Neuro: alert and oriented x 3 Psych: normal mood and affect   Data Reviewed: Imaging: Chest x-ray 07/24/2018- no active cardiopulmonary disease. CT abdomen pelvis 03/01/2019- no acute abnormality in lower chest. High-resolution CT 07/08/2019- no interstitial lung disease.  Minimal air  trapping.  Aortic atherosclerosis.   CT chest 05/17/2020-no acute lung findings.  Tiny lesion in left kidney. High-resolution CT 04/12/2022-mild bland scarring at the bases.  No interstitial lung disease. Chest x-ray 01/24/2023-no acute cardiopulmonary abnormality CT abdomen pelvis 08/11/2023-visualized lungs are clear.  I have reviewed the images personally.  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  Assessment:  Hemoptysis-resolved Unclear if this is actually hemoptysis.  She has been worked up for hematuria with anti-PR-3 antibody. Kidney biopsy with no evidence of vasculitis.  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 There is also question of GERD with esophageal stricture as she is seeing a gastroenterologist for dilatation.  CT reviewed with no obvious source of hemoptysis and there are no airway abnormalities on bronchoscopy.  She has  not had any recurrence of her hemoptysis  Asthma Intermittent asthma exacerbated by cold weather and congestion. Poor tolerance to inhaled steroids, specifically Symbicort, due to tachycardia. Ventolin tolerated but only for symptomatic relief. Discussed alternative treatments including Singulair and Breo. Informed consent provided regarding Singulair's potential side effects (mood changes, stomach upset) and the rare occurrence of these side effects. Discussed cost and efficacy of Breo as an alternative to Symbicort.  Also discussed Airsupra as an alternative  - Prescribe Breo 100 mcg,  one puff once a day - Continue Albuterol as needed for emergency use.  Reconsider use of Airsupra - Hold off on Singulair for now as she does not want to start too many medications  Atypical Chest Pain Recurrent sharp chest pain not related to cardiac ischemia per cardiologist's evaluation. Advised to continue isosorbide and Norvasc. Chest pain may be related to intermittent asthma or other non-cardiac causes. Cardiologist indicated no blockage but possible blood flow issues affecting the heart, not indicative of an imminent myocardial infarction. - Continue isosorbide - Continue Norvasc  General Health Maintenance Addressed concerns about potential insurance fraud and incorrect address information. - Verify and update address information with the front desk - Contact insurance to ensure no fraudulent billing is occurring  Follow-up - Follow up in six months with the pulmonologist or [facility].   Plan/Recommendations: Virgel Bouquet Albuterol PRN  Chilton Greathouse MD [facility] 12/20/2023, 8:42 AM  CC: Merri Brunette, MD

## 2023-12-24 ENCOUNTER — Other Ambulatory Visit (HOSPITAL_COMMUNITY): Payer: Self-pay

## 2023-12-24 ENCOUNTER — Telehealth: Payer: Self-pay

## 2023-12-24 NOTE — Telephone Encounter (Signed)
*  Pulm  Pharmacy Patient Advocate Encounter   Received notification from Fax that prior authorization for Fluticasone Furoate-Vilanterol 100-25MCG/ACT aerosol powder  is required/requested.   Insurance verification completed.   The patient is insured through [facility] .   Per test claim: PA required; PA submitted to above mentioned insurance via CoverMyMeds Key/confirmation #/EOC [id number] Status is pending

## 2023-12-27 ENCOUNTER — Other Ambulatory Visit: Payer: Self-pay | Admitting: Gastroenterology

## 2023-12-30 ENCOUNTER — Other Ambulatory Visit (HOSPITAL_COMMUNITY): Payer: Self-pay

## 2023-12-30 NOTE — Telephone Encounter (Signed)
 Pharmacy Patient Advocate Encounter  Received notification from [facility] that Prior Authorization for Fluticasone Furoate-Vilanterol 100-25MCG/ACT aerosol powder  has been APPROVED from 12/24/2023 to 12/23/2024. Unable to obtain price due to refill too soon rejection, last fill date 12/27/2023 next available fill date03/18/2025

## 2024-02-14 ENCOUNTER — Emergency Department (HOSPITAL_COMMUNITY)

## 2024-02-14 ENCOUNTER — Emergency Department (HOSPITAL_COMMUNITY)
Admission: EM | Admit: 2024-02-14 | Discharge: 2024-02-14 | Disposition: A | Discharge status: Home / Self Care | Attending: Emergency Medicine | Admitting: Emergency Medicine

## 2024-02-14 ENCOUNTER — Other Ambulatory Visit: Payer: Self-pay

## 2024-02-14 DIAGNOSIS — H539 Unspecified visual disturbance: Secondary | ICD-10-CM | POA: Insufficient documentation

## 2024-02-14 DIAGNOSIS — R200 Anesthesia of skin: Secondary | ICD-10-CM | POA: Diagnosis not present

## 2024-02-14 DIAGNOSIS — H538 Other visual disturbances: Secondary | ICD-10-CM

## 2024-02-14 DIAGNOSIS — Z7982 Long term (current) use of aspirin: Secondary | ICD-10-CM | POA: Insufficient documentation

## 2024-02-14 DIAGNOSIS — R531 Weakness: Secondary | ICD-10-CM | POA: Diagnosis present

## 2024-02-14 LAB — DIFFERENTIAL
Abs Immature Granulocytes: 0.02 10*3/uL (ref 0.00–0.07)
Basophils Absolute: 0.1 10*3/uL (ref 0.0–0.1)
Basophils Relative: 1 %
Eosinophils Absolute: 0.8 10*3/uL — ABNORMAL HIGH (ref 0.0–0.5)
Eosinophils Relative: 11 %
Immature Granulocytes: 0 %
Lymphocytes Relative: 39 %
Lymphs Abs: 2.8 10*3/uL (ref 0.7–4.0)
Monocytes Absolute: 0.5 10*3/uL (ref 0.1–1.0)
Monocytes Relative: 7 %
Neutro Abs: 3 10*3/uL (ref 1.7–7.7)
Neutrophils Relative %: 42 %

## 2024-02-14 LAB — COMPREHENSIVE METABOLIC PANEL WITH GFR
ALT: 21 U/L (ref 0–44)
AST: 23 U/L (ref 15–41)
Albumin: 4 g/dL (ref 3.5–5.0)
Alkaline Phosphatase: 59 U/L (ref 38–126)
Anion gap: 9 (ref 5–15)
BUN: 14 mg/dL (ref 6–20)
CO2: 24 mmol/L (ref 22–32)
Calcium: 9.1 mg/dL (ref 8.9–10.3)
Chloride: 107 mmol/L (ref 98–111)
Creatinine, Ser: 0.71 mg/dL (ref 0.44–1.00)
GFR, Estimated: >60 mL/min (ref >60)
Glucose, Bld: 101 mg/dL — ABNORMAL HIGH (ref 70–99)
Potassium: 4.1 mmol/L (ref 3.5–5.1)
Sodium: 140 mmol/L (ref 135–145)
Total Bilirubin: 0.8 mg/dL (ref 0.0–1.2)
Total Protein: 7.3 g/dL (ref 6.5–8.1)

## 2024-02-14 LAB — I-STAT CHEM 8, ED
BUN: 16 mg/dL (ref 6–20)
Calcium, Ion: 1.12 mmol/L — ABNORMAL LOW (ref 1.15–1.40)
Chloride: 104 mmol/L (ref 98–111)
Creatinine, Ser: 0.7 mg/dL (ref 0.44–1.00)
Glucose, Bld: 97 mg/dL (ref 70–99)
HCT: 43 % (ref 36.0–46.0)
Hemoglobin: 14.6 g/dL (ref 12.0–15.0)
Potassium: 4.1 mmol/L (ref 3.5–5.1)
Sodium: 139 mmol/L (ref 135–145)
TCO2: 23 mmol/L (ref 22–32)

## 2024-02-14 LAB — CBC
HCT: 42.2 % (ref 36.0–46.0)
Hemoglobin: 14.1 g/dL (ref 12.0–15.0)
MCH: 30.6 pg (ref 26.0–34.0)
MCHC: 33.4 g/dL (ref 30.0–36.0)
MCV: 91.5 fL (ref 80.0–100.0)
Platelets: 386 10*3/uL (ref 150–400)
RBC: 4.61 MIL/uL (ref 3.87–5.11)
RDW: 12.5 % (ref 11.5–15.5)
WBC: 7.2 10*3/uL (ref 4.0–10.5)
nRBC: 0 % (ref 0.0–0.2)

## 2024-02-14 LAB — PROTIME-INR
INR: 1 (ref 0.8–1.2)
Prothrombin Time: 13.5 s (ref 11.4–15.2)

## 2024-02-14 LAB — ETHANOL: Alcohol, Ethyl (B): <10 mg/dL (ref <10)

## 2024-02-14 LAB — APTT: aPTT: 26 s (ref 24–36)

## 2024-02-14 LAB — CBG MONITORING, ED: Glucose-Capillary: 88 mg/dL (ref 70–99)

## 2024-02-14 MED ORDER — SODIUM CHLORIDE 0.9% FLUSH
3.0000 mL | Freq: Once | INTRAVENOUS | Status: AC
  Administered 2024-02-14: 3 mL via INTRAVENOUS

## 2024-02-14 NOTE — ED Notes (Signed)
 Pt understood d/c instructions and when to return to the ED. MD/PA had conversation w/ pt and no further questions were asked. Pt left via private vehicle to go home

## 2024-02-14 NOTE — ED Triage Notes (Signed)
 Pt send by optometry for further eval of blurred vision and L leg weakness that began at 1235 this afternoon; pt also c/o numbness to L face; L pupil dilated, states blurred vision started prior to eye appointment, but has been more consistent since

## 2024-02-14 NOTE — Discharge Instructions (Addendum)
 As discussed, your labs and imaging are reassuring.  There is no signs of the stroke.  Follow-up with your ophthalmologist for further evaluation of vision changes in your left eye.  Return to the ED if: You have new visual disturbances. You suddenly see flashing lights or floaters. You suddenly have a dark area in your field of vision, especially in the lower part. This can lead to a loss of central vision. You suddenly lose vision in one or both eyes. You have any symptoms of a stroke. "BE FAST" is an easy way to remember the main warning signs of a stroke: B - Balance. Signs are dizziness, sudden trouble walking, or loss of balance. E - Eyes. Signs are trouble seeing or a sudden change in vision. F - Face. Signs are sudden weakness or numbness of the face, or the face or eyelid drooping on one side. A - Arms. Signs are weakness or numbness in an arm. This happens suddenly and usually on one side of the body. S - Speech. Signs are sudden trouble speaking, slurred speech, or trouble understanding what people say. T - Time. Time to call emergency services. Write down what time symptoms started. Have other signs of a stroke, such as: A sudden, severe headache with no known cause. Nausea or vomiting. A seizure.

## 2024-02-14 NOTE — Code Documentation (Addendum)
 Stroke Response Nurse Documentation Code Documentation  Tangi Shroff is a 58 y.o. female arriving to [facility] [place]  via [employer] on 02/14/2024 with past medical hx of abnormal involuntary movements, anxiety, disturbance in skin sensation, somatization disorder, spasm of muscle, unspecified hereditary and idiopathic peripheral neuropathy, myalgia and myositis, TIA, stroke 09/2022 and chronic headaches. On aspirin 81 mg daily.   Patient reports intermittent blurred vision for over 24 hours. Saw ophthalmologist earlier today. Noted the blurred vision has been continuous since around noon. Unable to determine clear last known well. Upon arrival to the emergency department, it was also noted she had diminished sensation on the left side of her face. Code stroke was activated by ED.   Stroke team at the bedside on patient arrival. Labs drawn and patient cleared for CT by EDP. Patient to CT with team. NIHSS 1, see documentation for details and code stroke times. Left pupil noted to be larger than the right. Patient with left decreased sensation on exam. Also reports leg pain in upper left leg that started while she was walking to the hospital. The following imaging was completed:  CT Head and MRI. Patient reports allergy to contrast. Code stroke canceled post MRI results.   Bedside handoff with ED RN.    Ferman Hamming Stroke Response RN

## 2024-02-14 NOTE — ED Provider Notes (Signed)
 [place] EMERGENCY DEPARTMENT AT [facility] Provider Note   CSN: [id number] Arrival date & time: 02/14/24  1338  An emergency department physician performed an initial assessment on this suspected stroke patient at 1353.  History  Chief Complaint  Patient presents with   Code Stroke    Virginia Shaw is a 58 y.o. female with a history of TIA, chronic pain, IBS who presents the ED today for stroke-like symptoms.  Patient reports that around 1230 this afternoon she developed weakness in the left lower extremity and body.  She went to her ophthalmologist who advised her to come to the ED to rule out stroke.  No confusion or slurred speech.  Has history of TIA in the past.  She is not on any blood thinners.    Home Medications Prior to Admission medications   Medication Sig Start Date End Date Taking? Authorizing Provider  albuterol (VENTOLIN HFA) 108 (90 Base) MCG/ACT inhaler Inhale 2 puffs into the lungs every 6 (six) hours as needed for wheezing or shortness of breath. 12/20/23   Mannam, Praveen, MD  amLODipine (NORVASC) 5 MG tablet Take 5 mg by mouth daily.     [provider]  Artificial Tear Ointment (DRY EYES OP) Place 1 drop into both eyes daily as needed (Dry eyes). Saline    [provider]  aspirin EC 81 MG tablet Take 81 mg by mouth 2 (two) times a week. Swallow whole    [provider]  fluticasone (FLONASE) 50 MCG/ACT nasal spray Place 1 spray into both nostrils daily. 06/18/23   Cobb, Mariah Shines, NP  fluticasone furoate-vilanterol (BREO ELLIPTA) 100-25 MCG/ACT AEPB Inhale 1 puff into the lungs daily. 12/20/23   Mannam, Praveen, MD  isosorbide mononitrate (IMDUR) 60 MG 24 hr tablet Take 60 mg by mouth in the morning.    [provider]  nitroGLYCERIN (NITROSTAT) 0.4 MG SL tablet Place 0.4 mg under the tongue every 5 (five) minutes x 3 doses as needed for chest pain.  12/24/18   [provider]      Allergies     Iodinated contrast media, Penicillins, Prednisone, Propoxyphene, Acetaminophen, Benzonatate, Buprenorphine hcl, Citric acid, Clindamycin, Codeine, Diazepam, Diclofenac, Esomeprazole, Morphine and codeine, Nsaids, Pantoprazole sodium, Albuterol and levalbuterol, Ceftin [cefuroxime axetil], Cephalexin, Cortisone, Doxycycline, Hydrocodone-acetaminophen, Ibuprofen, Loracarbef, Macrobid [nitrofurantoin monohyd macro], Maxalt [rizatriptan], Methylprednisolone sodium succ, Naproxen, Omeprazole, Oxycodone-acetaminophen, Phenazopyridine hcl, Tomato, Budesonide, Diltiazem, and Lidocaine    Review of Systems   Review of Systems  Eyes:  Positive for visual disturbance.  Neurological:  Positive for numbness.  All other systems reviewed and are negative.   Physical Exam Updated Vital Signs BP 125/70 (BP Location: Right Arm)   Pulse 71   Temp 97.8 F (36.6 C) (Oral)   Resp 17   Wt 59.8 kg   SpO2 100%   BMI 25.75 kg/m  Physical Exam Vitals and nursing note reviewed.  Constitutional:      General: She is not in acute distress.    Appearance: Normal appearance.  HENT:     Head: Normocephalic and atraumatic.     Mouth/Throat:     Mouth: Mucous membranes are moist.  Eyes:     Conjunctiva/sclera: Conjunctivae normal.     Pupils: Pupils are equal, round, and reactive to light.  Cardiovascular:     Rate and Rhythm: Normal rate and regular rhythm.     Pulses: Normal pulses.     Heart sounds: Normal heart sounds.  Pulmonary:  Effort: Pulmonary effort is normal.     Breath sounds: Normal breath sounds.  Abdominal:     Palpations: Abdomen is soft.     Tenderness: There is no abdominal tenderness.  Skin:    General: Skin is warm and dry.     Findings: No rash.  Neurological:     General: No focal deficit present.     Mental Status: She is alert.     Cranial Nerves: Cranial nerve deficit present.     Sensory: Sensory deficit present.     Motor: Weakness present.     Comments: Decreased  sensation to touch of left side of face. CN 7 intact.   Weakness to left lower extremity. Right lower extremity strength intact. Sensation of lower extremities intact bilaterally. Strength and sensation of upper extremities intact bilaterally.  Normal finger-to-nose test. No pronator drift.  Psychiatric:        Mood and Affect: Mood normal.        Behavior: Behavior normal.     ED Results / Procedures / Treatments   Labs (all labs ordered are listed, but only abnormal results are displayed) Labs Reviewed  DIFFERENTIAL - Abnormal; Notable for the following components:      Result Value   Eosinophils Absolute 0.8 (*)    All other components within normal limits  COMPREHENSIVE METABOLIC PANEL WITH GFR - Abnormal; Notable for the following components:   Glucose, Bld 101 (*)    All other components within normal limits  I-STAT CHEM 8, ED - Abnormal; Notable for the following components:   Calcium, Ion 1.12 (*)    All other components within normal limits  PROTIME-INR  APTT  CBC  ETHANOL  CBG MONITORING, ED  CBG MONITORING, ED    EKG EKG Interpretation Date/Time:  Friday February 14 2024 13:48:57 EDT Ventricular Rate:  79 PR Interval:  140 QRS Duration:  64 QT Interval:  370 QTC Calculation: 424 R Axis:   81  Text Interpretation: Normal sinus rhythm Normal ECG When compared with ECG of 13-Nov-2023 13:18, PREVIOUS ECG IS PRESENT when compared to prior, similar appearace No STEMI Confirmed by Wynell Heath ([id number]) on 02/14/2024 2:32:09 PM  Radiology MR BRAIN WO CONTRAST Result Date: 02/14/2024 CLINICAL DATA:  Neuro deficit, acute, stroke suspected. Transient left eye visual loss. Multiple episodes since yesterday. EXAM: MRI HEAD WITHOUT CONTRAST TECHNIQUE: Multiplanar, multiecho pulse sequences of the brain and surrounding structures were obtained without intravenous contrast. COMPARISON:  Head CT 02/14/2024. FINDINGS: Brain: No acute infarct or hemorrhage. No mass or midline  shift. No hydrocephalus or extra-axial collection. No abnormal susceptibility. Vascular: Normal flow voids. Skull and upper cervical spine: Susceptibility artifact in the upper cervical spine from prior ACDF. Otherwise normal marrow signal. Sinuses/Orbits: No acute findings. Other: None. IMPRESSION: No acute intracranial process. Electronically Signed   By: Audra Blend M.D.   On: 02/14/2024 15:11   CT HEAD CODE STROKE WO CONTRAST Result Date: 02/14/2024 CLINICAL DATA:  Code stroke.  Blurred vision. EXAM: CT HEAD WITHOUT CONTRAST TECHNIQUE: Contiguous axial images were obtained from the base of the skull through the vertex without intravenous contrast. RADIATION DOSE REDUCTION: This exam was performed according to the departmental dose-optimization program which includes automated exposure control, adjustment of the mA and/or kV according to patient size and/or use of iterative reconstruction technique. COMPARISON:  CT head without contrast 07/21/2021. FINDINGS: Brain: No acute infarct, hemorrhage, or mass lesion is present. No significant white matter lesions are present. Deep brain nuclei are  within normal limits. The ventricles are of normal size. No significant extraaxial fluid collection is present. The brainstem and cerebellum are within normal limits. Suboccipital craniotomy noted. Midline structures are otherwise within normal limits. Vascular: No hyperdense vessel or unexpected calcification. Skull: No significant extracranial soft tissue lesion is present. Calvarium is otherwise within normal limits. Sinuses/Orbits: The paranasal sinuses and mastoid air cells are clear. Bilateral lens replacements are noted. ASPECTS [facility] Stroke Program Early CT Score) - Ganglionic level infarction (caudate, lentiform nuclei, internal capsule, insula, M1-M3 cortex): 7/7 - Supraganglionic infarction (M4-M6 cortex): 3/3 Total score (0-10 with 10 being normal): 10/10 IMPRESSION: 1. No acute intracranial abnormality  or significant interval change. 2. Aspects is 10/10. 3. Suboccipital craniotomy. The above was relayed via text pager to Dr. Bonnita Buttner on 02/14/2024 at 14:09 . Electronically Signed   By: Audree Leas M.D.   On: 02/14/2024 14:13    Procedures Procedures    Medications Ordered in ED Medications  sodium chloride flush (NS) 0.9 % injection 3 mL (3 mLs Intravenous Given 02/14/24 1436)    ED Course/ Medical Decision Making/ A&P                                 Medical Decision Making Amount and/or Complexity of Data Reviewed Labs: ordered. Radiology: ordered.   This patient presents to the ED for concern of vision changes and weakness, this involves an extensive number of treatment options, and is a complaint that carries with it a high risk of complications and morbidity.   Differential diagnosis includes: CVA, TIA, hypoglycemia, acute alcohol intoxication, electrolyte derangement, etc.   Comorbidities  See HPI above   Additional History  Additional history obtained from prior records   Cardiac Monitoring / EKG  The patient was maintained on a cardiac monitor.  I personally viewed and interpreted the cardiac monitored which showed: NSR with a heart rate of 78 bpm.   Lab Tests  I ordered and personally interpreted labs.  The pertinent results include:   CBG of 88 CMP and CBC are reassuring Ethanol is <10   Imaging Studies  I ordered imaging studies including CT head and MRI head.  I independently visualized and interpreted imaging which showed:  No acute intracranial abnormality or significant interval change.  Suboccipital craniotomy. No acute intracranial process on MRI. I agree with the radiologist interpretation   Consultations  Dr. Manus Sellers spoke with Dr. Bonnita Buttner with neurology - negative head imaging. No stroke. Patient can follow up outpatient with ophthalmology for vision changes.   Problem List / ED Course / Critical Interventions / Medication  Management  Patient reports blurred vision in the left eye this at around 12:30 this afternoon with associated left lower extremity weakness.  Patient was seen by her ophthalmologist who advised her to come into the ED for stroke rule out.  Evaluation she has numbness to the left side of her face. CN 7 remained intact. Weakness noted on the left lower extremity compared to the right.  Upper extremity strength remains intact bilaterally.  Code stroke was called due to symptoms starting within 4 hours.  Patient staffed with my attending Dr. Manus Sellers. I have reviewed the patients home medicines and have made adjustments as needed   Social Determinants of Health  Access to healthcare   Test / Admission - Considered  Discussed findings with patient. All questions were answered. She is stable and safe for discharge home. Return  precautions given        Final Clinical Impression(s) / ED Diagnoses Final diagnoses:  Vision changes    Rx / DC Orders ED Discharge Orders     None         Sonnie Dusky, PA-C 02/14/24 1709    Tegeler, Marine Sia, MD 02/15/24 1549

## 2024-02-14 NOTE — ED Notes (Signed)
Stroke paged out

## 2024-02-14 NOTE — Consult Note (Signed)
 NEUROLOGY CONSULT NOTE   Date of service: February 14, 2024 Patient Name: Virginia Shaw MRN:  [id number] DOB:  [birth year] Chief Complaint: "transient unilateral visual loss - multiple episodes over the last day" Requesting Provider: No att. providers found  History of Present Illness  Virginia Shaw is a 58 y.o. female with hx of as documented below, presenting for evaluation of multiple episodes of blurred vision in the right eye.  Reports that she has been having multiple episodes of blurred vision along with some chest pain that she had yesterday that has resolved but the episodes of blurred vision have been going on and off since yesterday.  She was not able to tell me a clear timeline on when a focal symptom happened that lasted for a while for me to go off of a consistent last known well. This morning she woke up with left eye vision that was blurred for longer than it has been before.  She went to her ophthalmologist, who on evaluation found that her visual fields are generally constricted and while getting that examination, she complained of further blurring/difficulty with vision from the left eye for which the PA from the ophthalmology office sent him to the ER for further evaluation.  In the ER, on triage nurse examination, she had some numbness on the left face along with the blurred vision.. A code stroke was activated due to sudden onset of vision loss and left facial numbness but on further history taking, she is not within time window for any intervention at this time due to unclear last known well due to ongoing vision symptoms.  Patient refused CT angiography head and neck because of prelisted allergy to iodinated contrast.  She also did not want to get MRI contrast because she has been told in the past that she is allergic to it as well.   On review of systems, she denies any headache or jaw claudication.  Denies any recent illnesses or sicknesses.  LKW: Unclear Modified  rankin score: 0-Completely asymptomatic and back to baseline post- stroke IV Thrombolysis: Unclear last known well EVT: Unclear last known well NIH stroke scale 1    ROS  Comprehensive ROS performed and pertinent positives documented in the HPI  Past History   Past Medical History:  Diagnosis Date   Abnormal involuntary movements(781.0)    Allergic rhinitis, cause unspecified    Anxiety state, unspecified    Cervical spondylosis without myelopathy    Chronic headaches    Disturbance of skin sensation    Fatty liver    per patient, "not sure but does remember being told"   GERD (gastroesophageal reflux disease)    History of chicken pox    Irritable bowel syndrome    Low blood pressure    Lumbago    Myalgia and myositis, unspecified    Osteoporosis    Other and unspecified angina pectoris    Other malaise and fatigue    Peripheral vascular disease (HCC)    per patient "diagnosed with it about 13-14 years ago and does not currently see someone for it, saw Dr. 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   no coronary spasm per cardiology notes 2024   Shortness of breath on exertion    per patient, "sometimes depends on distance"   Sinus arrhythmia    per patient   Somatization disorder    Spasm of muscle    Stroke (HCC)  09/11/2022   Systemic sclerosis (HCC)    TIA (transient ischemic attack)    Unspecified hereditary and idiopathic peripheral neuropathy     Past Surgical History:  Procedure Laterality Date   ABDOMINAL HYSTERECTOMY  1997   ANTERIOR CERVICAL DECOMP/DISCECTOMY FUSION N/A 02/18/2023   Procedure: Anterior Cervical Decompression Fusion - Cervical four-Cervical five;  Surgeon: Julio Sicks, MD;  Location: [facility] OR;  Service: Neurosurgery;  Laterality: N/A;  3C   arnold chiari malformation  2013   BACK SURGERY  2001, 2011,2013   Lumbar x 2   BALLOON DILATION N/A 12/22/2019   Procedure: BALLOON  DILATION;  Surgeon: Charlott Rakes, MD;  Location: [facility];  Service: Endoscopy;  Laterality: N/A;   barium swallow test  10/2019   bladder prolapse surgery  07/09/2023   at [facility] in [place] per patient   BLADDER SURGERY  2011   CARDIAC CATHETERIZATION     CARPAL TUNNEL RELEASE Left    CATARACT EXTRACTION     CERVICAL SPINE SURGERY  2013   CHOLECYSTECTOMY     COLONOSCOPY  2009   "Normal" Dr Jarold Motto   COLONOSCOPY  2002   "Normal" Dr Arnaldo Natal   COLONOSCOPY  ~2013   Dr Noe Gens.  "Chest pain with the prep" = aborted   COLONOSCOPY WITH PROPOFOL N/A 03/29/2020   Procedure: COLONOSCOPY WITH PROPOFOL;  Surgeon: Charlott Rakes, MD;  Location: [facility];  Service: Endoscopy;  Laterality: N/A;   CRANIECTOMY SUBOCCIPITAL W/ CERVICAL LAMINECTOMY / CHIARI  2012   Dr Jordan Likes, Chiari decompression with suboccipital craniectomy and   CYSTOCELE REPAIR  2002   Dr Henderson Cloud   ESOPHAGOGASTRODUODENOSCOPY (EGD) WITH PROPOFOL N/A 12/22/2019   Procedure: ESOPHAGOGASTRODUODENOSCOPY (EGD) WITH PROPOFOL;  Surgeon: Charlott Rakes, MD;  Location: [facility];  Service: Endoscopy;  Laterality: N/A;   INSERTION OF MESH Bilateral 10/04/2020   Procedure: INSERTION OF MESH;  Surgeon: Axel Filler, MD;  Location: [facility] OR;  Service: General;  Laterality: Bilateral;   OOPHORECTOMY  1999, 2000   PITUITARY EXCISION  2010   pituitary cyst removed   RECTOCELE REPAIR  2002   Dr. Henderson Cloud   RENAL BIOPSY  08/04/2019   tear duct tubes  2005   TUBAL LIGATION     VIDEO BRONCHOSCOPY N/A 05/14/2022   Procedure: VIDEO BRONCHOSCOPY WITHOUT FLUORO;  Surgeon: Chilton Greathouse, MD;  Location: [facility];  Service: Cardiopulmonary;  Laterality: N/A;   XI ROBOTIC ASSISTED INGUINAL HERNIA REPAIR WITH MESH Bilateral 10/04/2020   Procedure: XI ROBOTIC ASSISTED BILATERAL INGUINAL HERNIA REPAIR WITH MESH;  Surgeon: Axel Filler, MD;  Location: [facility] OR;  Service: General;  Laterality: Bilateral;     Family History: Family History  Problem Relation Age of Onset   Hypertension Father    Hyperlipidemia Father    Diabetes Father    Stroke Father    Cancer Maternal Uncle        lung   Cancer Paternal Aunt        lung   Migraines Mother    Heart attack Paternal Grandmother    Heart attack Paternal Grandfather    Hypertension Paternal Grandfather    Migraines Sister     Social History  reports that she has never smoked. She has been exposed to tobacco smoke. She has never used smokeless tobacco. She reports that she does not drink alcohol and does not use drugs.  Allergies  Allergen Reactions   Iodinated Contrast Media Hives and Itching    Throat scratchiness mild *Pt needs  to receive a 13 hour prednisone prep prior to future CT scans containing Iodinated contrast media* Patient has prednisone allergy as well!! Throat scratchiness   MRI causes severe headache, swelling,N & V,  bad sore throat and chest pain  Also caused kidney swelling and pain   Penicillins Anaphylaxis and Rash    Did it involve swelling of the face/tongue/throat, SOB, or low BP? Yes Did it involve sudden or severe rash/hives, skin peeling, or any reaction on the inside of your mouth or nose? Yes Did you need to seek medical attention at a hospital or doctor's office? Yes When did it last happen?    don't remember  If all above answers are "NO", may proceed with cephalosporin use.    Prednisone Other (See Comments)    Chest pain and headache. Bladder spasms, chest spasms, increase blood pressure    Propoxyphene Other (See Comments)    Chest pain.     Acetaminophen Other (See Comments)    Cannot take adult strength tylenol, it cause chest pain. Can take 1/4 tsp of Children's Tylenol only without any chest pain.   Benzonatate Other (See Comments)    Chest pain.   Buprenorphine Hcl Other (See Comments)    Bladder spasms   Citric Acid Hives and Swelling    Fever blisters in mouth   Clindamycin  Other (See Comments)    Chest tightness   Codeine Other (See Comments)    Chest pain.   Diazepam Other (See Comments)    Chest pain.   Diclofenac Palpitations    Rapid heartbeat   Esomeprazole Other (See Comments)    Chest pain and rectal bleeding.   Morphine And Codeine Other (See Comments)    Bladder spasms    Nsaids Other (See Comments)    Bladder spasms, chest pain    Pantoprazole Sodium Other (See Comments)    Chest spasms    Albuterol And Levalbuterol Other (See Comments)    Chest tightness and angina per patient (1 puff as needed)   Ceftin [Cefuroxime Axetil] Other (See Comments)    Chills & shakes Cannot urinate   Cephalexin Other (See Comments)    Unknown   Cortisone Other (See Comments)    unknown reaction   Doxycycline Other (See Comments)    Chest pain  Bladder spasm   Hydrocodone-Acetaminophen     Chest pain.   Ibuprofen Other (See Comments)    Chest pain  Cannot urinary   Loracarbef Other (See Comments)    All over   Macrobid [Nitrofurantoin Monohyd Macro] Other (See Comments)    Numbness in mouth   Maxalt [Rizatriptan]    Methylprednisolone Sodium Succ Other (See Comments)    unknown reaction   Naproxen     Chest pain   Omeprazole Other (See Comments)    Chest Tightness Bladder spasm   Oxycodone-Acetaminophen     Chest pain. (incontinence)   Phenazopyridine Hcl Other (See Comments)    Unknown reaction   Tomato Hives   Budesonide Other (See Comments)    fatigued   Diltiazem Palpitations    Rapid Heart beat   Lidocaine Rash     Cream-Burning & rash Chest pain  Bladder spasm    Medications   Current Facility-Administered Medications:    sodium chloride flush (NS) 0.9 % injection 3 mL, 3 mL, Intravenous, Once, Tegeler, Canary Brim, MD  Current Outpatient Medications:    albuterol (VENTOLIN HFA) 108 (90 Base) MCG/ACT inhaler, Inhale 2 puffs into the lungs every 6 (  six) hours as needed for wheezing or shortness of breath., Disp: 8 g,  Rfl: 2   amLODipine (NORVASC) 5 MG tablet, Take 5 mg by mouth daily. , Disp: , Rfl:    Artificial Tear Ointment (DRY EYES OP), Place 1 drop into both eyes daily as needed (Dry eyes). Saline, Disp: , Rfl:    aspirin EC 81 MG tablet, Take 81 mg by mouth 2 (two) times a week. Swallow whole, Disp: , Rfl:    fluticasone (FLONASE) 50 MCG/ACT nasal spray, Place 1 spray into both nostrils daily., Disp: 18.2 mL, Rfl: 2   fluticasone furoate-vilanterol (BREO ELLIPTA) 100-25 MCG/ACT AEPB, Inhale 1 puff into the lungs daily., Disp: 30 each, Rfl: 11   isosorbide mononitrate (IMDUR) 60 MG 24 hr tablet, Take 60 mg by mouth in the morning., Disp: , Rfl:    nitroGLYCERIN (NITROSTAT) 0.4 MG SL tablet, Place 0.4 mg under the tongue every 5 (five) minutes x 3 doses as needed for chest pain. , Disp: , Rfl:   Vitals   Vitals:   03-12-2024 1343  BP: 115/73  Pulse: 76  Resp: 20  Temp: (!) 97.5 F (36.4 C)  SpO2: 100%  Weight: 59.8 kg    Body mass index is 25.75 kg/m.  Physical Exam   General: Awake alert in no distress HEENT: Normocephalic atraumatic Lungs: Clear Cardiovascular: Regular rhythm Neurological exam She is awake alert oriented x 3.  There is no dysarthria or aphasia. Cranial examination: Her left eye is dilated does not respond to light-this was done at the ophthalmologist office recently.  Right eye with some reaction.  Extraocular movements intact, visual fields to my confrontation testing appear intact although she reports blurred vision from the left eye-at this point it is difficult to ascertain whether that is due to dilated pupil or the initial set of blurriness that she was having.  Facial sensation intact, face symmetric, tongue and palate midline. Motor examination with no drift on any of the extremities. Sensation: Diminished on the left face No incoordination  Labs/Imaging/Neurodiagnostic studies   CBC:  Recent Labs  Lab 03-12-24 1357 2024-03-12 1400  WBC 7.2  --   NEUTROABS  3.0  --   HGB 14.1 14.6  HCT 42.2 43.0  MCV 91.5  --   PLT 386  --    Basic Metabolic Panel:  Lab Results  Component Value Date   NA 133 (L) 11/13/2023   K 3.9 11/13/2023   CO2 28 11/13/2023   GLUCOSE 91 11/13/2023   BUN 13 11/13/2023   CREATININE 0.63 11/13/2023   CALCIUM 9.0 11/13/2023   GFRNONAA >60 11/13/2023   GFRAA >60 04/21/2020   Lipid Panel:  Lab Results  Component Value Date   LDLCALC 94 09/17/2012    INR  Lab Results  Component Value Date   INR 1.0 08/04/2019   CT Head without contrast(Personally reviewed): Aspects 10.  No bleed  MRI Brain(Personally reviewed): No acute stroke on my preliminary review.  Formal read pending  MRI read from February 2025-no acute intracranial abnormality, redemonstrated postsurgical changes of transsphenoidal pituitary surgery-unchanged cystic focus within the anterior pituitary-possible postsurgical  ASSESSMENT   Azlee Monforte is a 58 y.o. female past medical history as as documented above that includes multiple nonspecific neurological complaints as well as history of somatization disorder presenting for evaluation of transient left-sided blurred vision that has been ongoing since yesterday.  She has a documented history of TIAs of localization to various different areas in the brain  but at this point, on my examination, I cannot localize this to 1 particular brain hemisphere. She had sudden onset of left facial numbness along with blurred vision which definitely made me suspicious for a small vessel etiology infarct, for which I rushed her to the MRI but the MRI does not reveal any acute infarct. I am not very sure as to how to tie this down to a vascular territory or hemisphere and hence it is possible that some of the symptoms may be related to stress or anxiety. In any case, she does not have an acute stroke and does not need further stroke intervention at this time  Impression: Left eye blurred vision, left facial  numbness-negative brain MRI.  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 Given no jaw claudication or headache, I do not suspect giant cell arteritis here If she continues to have recurrent transient symptoms, may need to consider doing MR angiography head without contrast for head vessels and carotid Dopplers for neck vessels but at this point, I do not think she is describing amaurosis fugax type picture that needs further workup.  Plan was discussed with Dr. Rush Landmark ______________________________________________________________________    Signed, Milon Dikes, MD [facility]

## 2024-07-08 ENCOUNTER — Ambulatory Visit (HOSPITAL_COMMUNITY)
Admission: RE | Admit: 2024-07-08 | Discharge: 2024-07-08 | Disposition: A | Discharge status: Home / Self Care | Payer: Medicaid Other | Source: Ambulatory Visit | Attending: Gastroenterology | Admitting: Gastroenterology

## 2024-07-08 ENCOUNTER — Encounter (HOSPITAL_COMMUNITY)
Admission: RE | Disposition: A | Discharge status: Home / Self Care | Payer: Self-pay | Source: Ambulatory Visit | Attending: Gastroenterology

## 2024-07-08 DIAGNOSIS — Z5329 Procedure and treatment not carried out because of patient's decision for other reasons: Secondary | ICD-10-CM | POA: Insufficient documentation

## 2024-07-08 DIAGNOSIS — R1310 Dysphagia, unspecified: Secondary | ICD-10-CM | POA: Diagnosis present

## 2024-07-08 SURGERY — INVASIVE LAB ABORTED CASE

## 2024-07-08 MED ORDER — LIDOCAINE VISCOUS HCL 2 % MT SOLN
OROMUCOSAL | Status: AC
  Filled 2024-07-08: qty 15

## 2024-07-08 SURGICAL SUPPLY — 2 items
FACESHIELD LNG OPTICON STERILE (SAFETY) IMPLANT
GLOVE BIO SURGEON STRL SZ8 (GLOVE) ×4 IMPLANT

## 2024-07-08 NOTE — Progress Notes (Signed)
 Esophageal Manometry attempted. Patient complained of hurting in nose. Able to get just past the nasal area on left side and patient stated she wanted me to stop. Unable to use lidocaine  in nares due to an allergy and patient agreed to attempt without lidocaine  usage.  Patient stated she was unable to attempt again.

## 2024-08-19 NOTE — Progress Notes (Signed)
 Office Visit Note  Patient: Virginia Shaw             Date of Birth: Mar 24, 1966           MRN: [id number]             PCP: Claudene Pellet, MD Referring: Claudene Pellet, MD Visit Date: 09/02/2024 Occupation: Data Unavailable  Subjective:  Pain in multiple joints and muscles  History of Present Illness: Virginia Shaw is a 58 y.o. female history of fibromyalgia, osteoarthritis and degenerative disc disease.  Patient states send January 2025 she was having chest pain at the time she was evaluated by the cardiologist and was told that she had atypical chest pain.  In April 2025 she was having blurred vision and there was question about possible TIA.  She was seen in the emergency room and was evaluated by neurology.  I reviewed the records according to that she mentioned that it was possible somatization MRI was negative.  He recommended following up with the ophthalmologist.  She was also referred to [facility] ophthalmology with history of recurrent TIAs and left-sided symptoms.  According to the [facility] ophthalmology note the MRI and the CT scan in April 2025 were unrevealing.  In the assessment complex migraine is the consideration.  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  She was advised to follow-up in 2 years. She states has been having discomfort in her right hand and the left leg.  She has some nocturnal pain and insomnia because of pain.  She had left carpal tunnel release in 2022.  The right carpal tunnel release was not successful.  Continues to have pain in her neck and lower back.    Activities of Daily Living:  Patient reports morning stiffness for 5-10 minutes.   Patient Reports nocturnal pain.  Difficulty dressing/grooming: Denies Difficulty climbing stairs: Reports Difficulty getting out of chair: Reports Difficulty using hands for taps, buttons, cutlery, and/or writing: Reports  Review of Systems  Constitutional:  Positive for fatigue.  HENT:   Negative for mouth sores and mouth dryness.   Eyes:  Positive for pain and dryness.  Respiratory:  Positive for shortness of breath.   Cardiovascular:  Positive for chest pain and palpitations.  Gastrointestinal:  Negative for blood in stool, constipation and diarrhea.  Endocrine: Negative for increased urination.  Genitourinary:  Negative for involuntary urination.  Musculoskeletal:  Positive for joint pain, joint pain, joint swelling, myalgias, muscle weakness, morning stiffness, muscle tenderness and myalgias. Negative for gait problem.  Skin:  Positive for rash and redness. Negative for color change, hair loss and sensitivity to sunlight.  Allergic/Immunologic: Negative for susceptible to infections.  Neurological:  Negative for dizziness and headaches.  Hematological:  Negative for swollen glands.  Psychiatric/Behavioral:  Positive for sleep disturbance. Negative for depressed mood. The patient is not nervous/anxious.     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   Chronic abdominal pain 01/11/2014   Chronic venous  insufficiency 10/30/2013   Varicose veins 10/30/2013   Chest tightness 03/25/2012   PITUITARY ADENOMA, 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   RECTAL BLEEDING 08/26/2008   UTI'S, RECURRENT 08/26/2008    Past Medical History:  Diagnosis Date   Abnormal involuntary movements(781.0)    Allergic rhinitis, cause  unspecified    Anxiety state, unspecified    Cervical spondylosis without myelopathy    Chronic headaches    Disturbance of skin sensation    Fatty liver    per patient, not sure but does remember being told   GERD (gastroesophageal reflux disease)    History of chicken pox    Irritable bowel syndrome    Low blood pressure    Lumbago    Myalgia and myositis, unspecified    Osteoporosis    Other and unspecified angina pectoris    Other malaise and fatigue    Peripheral vascular disease    per patient diagnosed with it about 13-14 years ago and does not currently see someone for it, saw Dr. 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   no coronary spasm per cardiology notes 2024   Shortness of breath on exertion    per patient, sometimes depends on distance   Sinus arrhythmia    per patient   Somatization disorder    Spasm of muscle    Stroke (HCC) 09/11/2022   Systemic sclerosis (HCC)    TIA (transient ischemic attack)    Unspecified hereditary and idiopathic peripheral neuropathy     Family History  Problem Relation Age of Onset   Hypertension Father    Hyperlipidemia Father    Diabetes Father    Stroke Father    Cancer Maternal Uncle        lung   Cancer Paternal Aunt        lung   Migraines Mother    Heart attack Paternal Grandmother    Heart attack Paternal Grandfather    Hypertension Paternal Grandfather    Migraines Sister    Past Surgical History:  Procedure Laterality Date   ABDOMINAL HYSTERECTOMY  1997   ANTERIOR CERVICAL DECOMP/DISCECTOMY FUSION N/A 02/18/2023   Procedure: Anterior Cervical Decompression Fusion - Cervical four-Cervical five;  Surgeon: Louis Shove, MD;  Location: [facility] OR;  Service: Neurosurgery;  Laterality: N/A;  3C   arnold chiari malformation  2013   BACK SURGERY  2001, 2011,2013   Lumbar x 2   BALLOON DILATION N/A 12/22/2019   Procedure: BALLOON DILATION;  Surgeon:  Dianna Specking, MD;  Location: [facility] END[facility]COPY;  Service: Endoscopy;  Laterality: N/A;   barium swallow test  10/2019   bladder prolapse surgery  07/09/2023   at [facility] in [place] per patient   BLADDER SURGERY  2011   CARDIAC CATHETERIZATION     CARPAL TUNNEL RELEASE Left    CATARACT EXTRACTION     CERVICAL SPINE SURGERY  2013   CHOLECYSTECTOMY     COLONOSCOPY  2009   Normal Dr Jakie   COLONOSCOPY  2002   Normal Dr Marene   COLONOSCOPY  ~2013   Dr Zulema.  Chest pain with the prep = aborted   COLONOSCOPY WITH PROPOFOL  N/A 03/29/2020   Procedure: COLONOSCOPY WITH PROPOFOL ;  Surgeon: Dianna Specking, MD;  Location: [facility];  Service: Endoscopy;  Laterality: N/A;   CRANIECTOMY SUBOCCIPITAL W/ CERVICAL LAMINECTOMY / CHIARI  2012   Dr Louis, Chiari decompression with suboccipital craniectomy and   CYSTOCELE  REPAIR  2002   Dr Curlene   ESOPHAGOGASTRODUODENOSCOPY (EGD) WITH PROPOFOL  N/A 12/22/2019   Procedure: ESOPHAGOGASTRODUODENOSCOPY (EGD) WITH PROPOFOL ;  Surgeon: Dianna Specking, MD;  Location: [facility];  Service: Endoscopy;  Laterality: N/A;   INSERTION OF MESH Bilateral 10/04/2020   Procedure: INSERTION OF MESH;  Surgeon: Rubin Calamity, MD;  Location: [facility] OR;  Service: General;  Laterality: Bilateral;   OOPHORECTOMY  1999, 2000   PITUITARY EXCISION  2010   pituitary cyst removed   RECTOCELE REPAIR  2002   Dr. Curlene   RENAL BIOPSY  08/04/2019   tear duct tubes  2005   TUBAL LIGATION     VIDEO BRONCHOSCOPY N/A 05/14/2022   Procedure: VIDEO BRONCHOSCOPY WITHOUT FLUORO;  Surgeon: Theophilus Roosevelt, MD;  Location: [facility];  Service: Cardiopulmonary;  Laterality: N/A;   XI ROBOTIC ASSISTED INGUINAL HERNIA REPAIR WITH MESH Bilateral 10/04/2020   Procedure: XI ROBOTIC ASSISTED BILATERAL INGUINAL HERNIA REPAIR WITH MESH;  Surgeon: Rubin Calamity, MD;  Location: [facility] OR;  Service: General;  Laterality: Bilateral;   Social History    Tobacco Use   Smoking status: Never    Passive exposure: Past   Smokeless tobacco: Never  Vaping Use   Vaping status: Never Used  Substance Use Topics   Alcohol use: No   Drug use: No   Social History   Social History Narrative   Lives at home with children. ( 2 daughters)   Regular exercise-yes   Caffeine Use-no   GED   Not Working outside home     Immunization History  Administered Date(s) Administered   Tdap 10/08/2018     Objective: Vital Signs: BP 118/75   Pulse 74   Temp 98 F (36.7 C)   Resp 15   Ht 5' (1.524 m)   Wt 131 lb (59.4 kg)   BMI 25.58 kg/m    Physical Exam Vitals and nursing note reviewed.  Constitutional:      Appearance: She is well-developed.  HENT:     Head: Normocephalic and atraumatic.  Eyes:     Conjunctiva/sclera: Conjunctivae normal.  Cardiovascular:     Rate and Rhythm: Normal rate and regular rhythm.     Heart sounds: Normal heart sounds.  Pulmonary:     Effort: Pulmonary effort is normal.     Breath sounds: Normal breath sounds.  Abdominal:     General: Bowel sounds are normal.     Palpations: Abdomen is soft.  Musculoskeletal:     Cervical back: Normal range of motion.  Lymphadenopathy:     Cervical: No cervical adenopathy.  Skin:    General: Skin is warm and dry.     Capillary Refill: Capillary refill takes less than 2 seconds.     Comments: Few angiomas were noted on the abdominal region.  Neurological:     Mental Status: She is alert and oriented to person, place, and time.  Psychiatric:        Behavior: Behavior normal.      Musculoskeletal Exam: She had limited painful range of motion of the cervical spine.  Thoracic kyphosis was noted.  She had tenderness in the upper thoracic region and also in the lower lumbar region.  She had limited mobility in her spine.  She had bilateral trapezius spasm.  Shoulders, elbows, wrist joints were in good range of motion with no synovitis.  Bilateral PIP and DIP thickening  was noted.  Hip joints and knee joints in good range of motion.  No warmth swelling  or effusion was noted.  There was no tenderness over ankles or MTPs.  CDAI Exam: CDAI Score: -- Patient Global: --; Provider Global: -- Swollen: --; Tender: -- Joint Exam 09/02/2024   No joint exam has been documented for this visit   There is currently no information documented on the homunculus. Go to the Rheumatology activity and complete the homunculus joint exam.  Investigation: No additional findings.  Imaging: No results found.  Recent Labs: Lab Results  Component Value Date   WBC 7.2 02/14/2024   HGB 14.6 02/14/2024   PLT 386 02/14/2024   NA 139 02/14/2024   K 4.1 02/14/2024   CL 104 02/14/2024   CO2 24 02/14/2024   GLUCOSE 97 02/14/2024   BUN 16 02/14/2024   CREATININE 0.70 02/14/2024   BILITOT 0.8 02/14/2024   ALKPHOS 59 02/14/2024   AST 23 02/14/2024   ALT 21 02/14/2024   PROT 7.3 02/14/2024   ALBUMIN  4.0 02/14/2024   CALCIUM 9.1 02/14/2024   GFRAA >60 04/21/2020    Speciality Comments: No specialty comments available.  Procedures:  No procedures performed Allergies: Iodinated contrast media, Penicillins, Prednisone , Propoxyphene, Acetaminophen , Benzonatate , Buprenorphine hcl, Citric acid, Clindamycin, Codeine, Diazepam, Diclofenac , Esomeprazole, Morphine and codeine, Nsaids, Pantoprazole sodium, Albuterol  and levalbuterol , Ceftin [cefuroxime axetil], Cephalexin, Cortisone, Doxycycline, Hydrocodone -acetaminophen , Ibuprofen, Loracarbef, Macrobid  [nitrofurantoin  monohyd macro], Maxalt [rizatriptan], Methylprednisolone sodium succ, Naproxen, Omeprazole, Oxycodone-acetaminophen , Phenazopyridine hcl, Tomato, Budesonide , Diltiazem, and Lidocaine    Assessment / Plan:     Visit Diagnoses: Fibromyalgia-patient gives history of generalized pain, hyperalgesia and nocturnal pain.  She has not been exercising on a regular basis.  I encouraged stretching, water aerobics and swimming.  Need  for regular exercise was discussed.  Primary osteoarthritis of both hands -she complains of discomfort in her hands and cramps.  No synovitis was noted.  All autoimmune workup was negative in the past.  She had bilateral PIP and DIP thickening suggestive of osteoarthritis.  Joint protection muscle strengthening was discussed.  Trigger finger, left middle finger -currently asymptomatic.  Bilateral carpal tunnel syndrome - Nerve conduction study performed at [facility].  Underwent carpal tunnel release left wrist 2022-Dr. Xu--recurrence of symptoms.  She complains of tingling of hands at times.  Phalen's and Tinel's were negative.  Trochanteric bursitis, left hip-she has intermittent discomfort.  IT band stretches were demonstrated.  DDD (degenerative disc disease), cervical-she had limited range of motion with stiffness and discomfort.  I will refer her to physical therapy.  Pain in thoracic spine-chronic discomfort.  Radiculopathy, lumbar region -she continues to have chronic pain.  She has not seen Dr. Georgina in a while.  X-rays of the lumbar spine were obtained in January 2024.  A handout on back exercises was given.  I will refer him to physical therapy.  Other medical problems are listed as follows:  Coronary artery spasm-she gets intermittent atypical chest pain.  She has been evaluated by cardiology.  History of IBS  Chronic pain syndrome  History of gastroesophageal reflux (GERD)  Chronic venous insufficiency  Pituitary adenoma (HCC)  History of ovarian cyst  Orders: No orders of the defined types were placed in this encounter.  No orders of the defined types were placed in this encounter.    Follow-Up Instructions: Return in about 1 year (around 09/02/2025) for FMS,OA.   Maya Nash, MD  Note - This record has been created using Animal nutritionist.  Chart creation errors have been sought, but may not always  have been located. Such creation errors do not  reflect on   the standard of medical care.

## 2024-08-24 NOTE — Progress Notes (Signed)
 Neurosurgery-Evaluation  Identifying Statement: Virginia Shaw is a 58 y.o. female from [facility] [place] [address] with pituitary cyst  The patient is seen in consultation at the request of Kocasarac, Can, MD  for the problem of above  and correspondence will be forwarded to their office.  History of Present Illness:   Per Dr. Warrick   Past Medical  & Surgical History:  Past Medical History:  Diagnosis Date  . Anesthesia complication    woke up with chest pain after cervical spine surgery  . Arthritis   . Chest pain 10/24/2014   Dr Augustus Cardiologist @ [facility]; last saw approx 2 months ago  . Chronic abdominal pain, unspecified   . Chronic back pain   . GERD (gastroesophageal reflux disease)   . History of ECG 08/18/2015   results in [facility] Everywhere [facility]; followed by cardiologist in [place]  . Kidney disease    Cyst in left kidney  . Numbness    arm  . Osteoporosis   . PONV (postoperative nausea and vomiting)   . Thyroid  disease   . TIA (transient ischemic attack)     Social History: Living arrangements (living alone, with partner): family   Family History: Family History  Problem Relation Name Age of Onset  . Peripheral Vascular Disease (PVD or blocked arteries in arms and legs) Mother         Venous Insufficiency  . Stroke Father    . High blood pressure (Hypertension) Father    . Diabetes Father      Review of Systems: Review of Systems  Constitutional:  Positive for unexpected weight change.  HENT:  Positive for dental problem, ear pain, sinus pressure, trouble swallowing and voice change.   Eyes:  Positive for photophobia, pain, itching and visual disturbance.  Respiratory:  Positive for chest tightness and shortness of breath.   Cardiovascular:  Positive for chest pain, palpitations and leg swelling.  Gastrointestinal:  Positive for abdominal distention, abdominal pain and nausea.  Endocrine: Positive for cold intolerance and heat intolerance.   Genitourinary:  Positive for dysuria, flank pain, hematuria and pelvic pain.  Musculoskeletal:  Positive for arthralgias, back pain, myalgias, neck pain and neck stiffness.  Allergic/Immunologic: Positive for environmental allergies and food allergies.  Neurological:  Positive for dizziness, weakness, light-headedness and numbness.  All other systems reviewed and are negative  Physical Exam: Vitals:   08/24/24 1300  BP: 108/67  Pulse: 71  Resp: 16  Temp: 37.1 C (98.8 F)  TempSrc: Oral  SpO2: 98%  Weight: 59.8 kg (131 lb 13.4 oz)  Height: 152 cm (4' 11.84)  PainSc:   4  PainLoc: Head - Entire   Body mass index is 25.88 kg/m. Body surface area is 1.59 meters squared. General appearance: alert, cooperative, pleasant, in no acute distress Head: Normocephalic, atraumatic Eyes: conjunctiva/corneas normal Oropharynx: moist without lesions, teeth in good repair Neck: supple and no JVD Heart:  Lungs: good air exchange Abdomen: soft, nondistended (Exposed) Skin: Without bruises or discoloration Respirations: Even and unlabored  Neurologic exam:  Mental status: alertness: alert, orientation: person, place, time, affect: normal Speech: fluent Cranial nerves:  Per Dr. Warrick    Imaging personally reviewed: Per Dr. Warrick     Impression/Plan:   Pituitary cyst Call for any change or concern. Brain MRI with and without contrast in 2 years.  Patient instructed to call at that time to schedule as we cannot schedule this far out.     Issues concerning treatment and diagnosis were  discussed with the patient. There are no barriers understanding the plan of treatment. Explanation was well received by patient and/or family who then verbalized understanding, and updated reconciled list of the patient's medications was reviewed with and provided to the patient.  Thank you for the opportunity to care for this patient.   This encounter was conducted with a moderate  degree of  medical decision making.

## 2024-09-02 ENCOUNTER — Encounter: Payer: Self-pay | Admitting: Rheumatology

## 2024-09-02 ENCOUNTER — Ambulatory Visit: Payer: Medicaid Other | Attending: Rheumatology | Admitting: Rheumatology

## 2024-09-02 VITALS — BP 118/75 | HR 74 | Temp 98.0°F | Resp 15 | Ht 60.0 in | Wt 131.0 lb

## 2024-09-02 DIAGNOSIS — M546 Pain in thoracic spine: Secondary | ICD-10-CM | POA: Insufficient documentation

## 2024-09-02 DIAGNOSIS — I872 Venous insufficiency (chronic) (peripheral): Secondary | ICD-10-CM | POA: Insufficient documentation

## 2024-09-02 DIAGNOSIS — M7062 Trochanteric bursitis, left hip: Secondary | ICD-10-CM | POA: Diagnosis present

## 2024-09-02 DIAGNOSIS — M19042 Primary osteoarthritis, left hand: Secondary | ICD-10-CM | POA: Insufficient documentation

## 2024-09-02 DIAGNOSIS — D352 Benign neoplasm of pituitary gland: Secondary | ICD-10-CM | POA: Insufficient documentation

## 2024-09-02 DIAGNOSIS — Z8742 Personal history of other diseases of the female genital tract: Secondary | ICD-10-CM | POA: Diagnosis present

## 2024-09-02 DIAGNOSIS — M19041 Primary osteoarthritis, right hand: Secondary | ICD-10-CM | POA: Insufficient documentation

## 2024-09-02 DIAGNOSIS — G894 Chronic pain syndrome: Secondary | ICD-10-CM | POA: Diagnosis present

## 2024-09-02 DIAGNOSIS — M797 Fibromyalgia: Secondary | ICD-10-CM | POA: Insufficient documentation

## 2024-09-02 DIAGNOSIS — Z8719 Personal history of other diseases of the digestive system: Secondary | ICD-10-CM | POA: Insufficient documentation

## 2024-09-02 DIAGNOSIS — M5416 Radiculopathy, lumbar region: Secondary | ICD-10-CM | POA: Diagnosis present

## 2024-09-02 DIAGNOSIS — G5603 Carpal tunnel syndrome, bilateral upper limbs: Secondary | ICD-10-CM | POA: Insufficient documentation

## 2024-09-02 DIAGNOSIS — I201 Angina pectoris with documented spasm: Secondary | ICD-10-CM | POA: Insufficient documentation

## 2024-09-02 DIAGNOSIS — M5030 Other cervical disc degeneration, unspecified cervical region: Secondary | ICD-10-CM | POA: Diagnosis present

## 2024-09-02 DIAGNOSIS — M65332 Trigger finger, left middle finger: Secondary | ICD-10-CM | POA: Diagnosis present

## 2024-09-02 NOTE — Patient Instructions (Signed)
 Hand Exercises Hand exercises can be helpful for almost anyone. They can strengthen your hands and improve flexibility and movement. The exercises can also increase blood flow to the hands. These results can make your work and daily tasks easier for you. Hand exercises can be especially helpful for people who have joint pain from arthritis or nerve damage from using their hands over and over. These exercises can also help people who injure a hand. Exercises Most of these hand exercises are gentle stretching and motion exercises. It is usually safe to do them often throughout the day. Warming up your hands before exercise may help reduce stiffness. You can do this with gentle massage or by placing your hands in warm water for 10-15 minutes. It is normal to feel some stretching, pulling, tightness, or mild discomfort when you begin new exercises. In time, this will improve. Remember to always be careful and stop right away if you feel sudden, very bad pain or your pain gets worse. You want to get better and be safe. Ask your health care provider which exercises are safe for you. Do exercises exactly as told by your provider and adjust them as told. Do not begin these exercises until told by your provider. Knuckle bend or claw fist  Stand or sit with your arm, hand, and all five fingers pointed straight up. Make sure to keep your wrist straight. Gently bend your fingers down toward your palm until the tips of your fingers are touching your palm. Keep your big knuckle straight and only bend the small knuckles in your fingers. Hold this position for 10 seconds. Straighten your fingers back to your starting position. Repeat this exercise 5-10 times with each hand. Full finger fist  Stand or sit with your arm, hand, and all five fingers pointed straight up. Make sure to keep your wrist straight. Gently bend your fingers into your palm until the tips of your fingers are touching the middle of your  palm. Hold this position for 10 seconds. Extend your fingers back to your starting position, stretching every joint fully. Repeat this exercise 5-10 times with each hand. Straight fist  Stand or sit with your arm, hand, and all five fingers pointed straight up. Make sure to keep your wrist straight. Gently bend your fingers at the big knuckle, where your fingers meet your hand, and at the middle knuckle. Keep the knuckle at the tips of your fingers straight and try to touch the bottom of your palm. Hold this position for 10 seconds. Extend your fingers back to your starting position, stretching every joint fully. Repeat this exercise 5-10 times with each hand. Tabletop  Stand or sit with your arm, hand, and all five fingers pointed straight up. Make sure to keep your wrist straight. Gently bend your fingers at the big knuckle, where your fingers meet your hand, as far down as you can. Keep the small knuckles in your fingers straight. Think of forming a tabletop with your fingers. Hold this position for 10 seconds. Extend your fingers back to your starting position, stretching every joint fully. Repeat this exercise 5-10 times with each hand. Finger spread  Place your hand flat on a table with your palm facing down. Make sure your wrist stays straight. Spread your fingers and thumb apart from each other as far as you can until you feel a gentle stretch. Hold this position for 10 seconds. Bring your fingers and thumb tight together again. Hold this position for 10 seconds. Repeat  this exercise 5-10 times with each hand. Making circles  Stand or sit with your arm, hand, and all five fingers pointed straight up. Make sure to keep your wrist straight. Make a circle by touching the tip of your thumb to the tip of your index finger. Hold for 10 seconds. Then open your hand wide. Repeat this motion with your thumb and each of your fingers. Repeat this exercise 5-10 times with each hand. Thumb  motion  Sit with your forearm resting on a table and your wrist straight. Your thumb should be facing up toward the ceiling. Keep your fingers relaxed as you move your thumb. Lift your thumb up as high as you can toward the ceiling. Hold for 10 seconds. Bend your thumb across your palm as far as you can, reaching the tip of your thumb for the small finger (pinkie) side of your palm. Hold for 10 seconds. Repeat this exercise 5-10 times with each hand. Grip strengthening  Hold a stress ball or other soft ball in the middle of your hand. Slowly increase the pressure, squeezing the ball as much as you can without causing pain. Think of bringing the tips of your fingers into the middle of your palm. All of your finger joints should bend when doing this exercise. Hold your squeeze for 10 seconds, then relax. Repeat this exercise 5-10 times with each hand. Contact a health care provider if: Your hand pain or discomfort gets much worse when you do an exercise. Your hand pain or discomfort does not improve within 2 hours after you exercise. If you have either of these problems, stop doing these exercises right away. Do not do them again unless your provider says that you can. Get help right away if: You develop sudden, severe hand pain or swelling. If this happens, stop doing these exercises right away. Do not do them again unless your provider says that you can. This information is not intended to replace advice given to you by your health care provider. Make sure you discuss any questions you have with your health care provider. Document Revised: 11/06/2022 Document Reviewed: 11/06/2022 [employer] Patient Education  2024 [employer]Low Back Sprain or Strain Rehab Ask your health care provider which exercises are safe for you. Do exercises exactly as told by your health care provider and adjust them as directed. It is normal to feel mild stretching, pulling, tightness, or discomfort as you do these  exercises. Stop right away if you feel sudden pain or your pain gets worse. Do not begin these exercises until told by your health care provider. Stretching and range-of-motion exercises These exercises warm up your muscles and joints and improve the movement and flexibility of your back. These exercises also help to relieve pain, numbness, and tingling. Lumbar rotation  Lie on your back on a firm bed or the floor with your knees bent. Straighten your arms out to your sides so each arm forms a 90-degree angle (right angle) with a side of your body. Slowly move (rotate) both of your knees to one side of your body until you feel a stretch in your lower back (lumbar). Try not to let your shoulders lift off the floor. Hold this position for __________ seconds. Tense your abdominal muscles and slowly move your knees back to the starting position. Repeat this exercise on the other side of your body. Repeat __________ times. Complete this exercise __________ times a day. Single knee to chest  Lie on your back on a firm  bed or the floor with both legs straight. Bend one of your knees. Use your hands to move your knee up toward your chest until you feel a gentle stretch in your lower back and buttock. Hold your leg in this position by holding on to the front of your knee. Keep your other leg as straight as possible. Hold this position for __________ seconds. Slowly return to the starting position. Repeat with your other leg. Repeat __________ times. Complete this exercise __________ times a day. Prone extension on elbows  Lie on your abdomen on a firm bed or the floor (prone position). Prop yourself up on your elbows. Use your arms to help lift your chest up until you feel a gentle stretch in your abdomen and your lower back. This will place some of your body weight on your elbows. If this is uncomfortable, try stacking pillows under your chest. Your hips should stay down, against the surface that  you are lying on. Keep your hip and back muscles relaxed. Hold this position for __________ seconds. Slowly relax your upper body and return to the starting position. Repeat __________ times. Complete this exercise __________ times a day. Strengthening exercises These exercises build strength and endurance in your back. Endurance is the ability to use your muscles for a long time, even after they get tired. Pelvic tilt This exercise strengthens the muscles that lie deep in the abdomen. Lie on your back on a firm bed or the floor with your legs extended. Bend your knees so they are pointing toward the ceiling and your feet are flat on the floor. Tighten your lower abdominal muscles to press your lower back against the floor. This motion will tilt your pelvis so your tailbone points up toward the ceiling instead of pointing to your feet or the floor. To help with this exercise, you may place a small towel under your lower back and try to push your back into the towel. Hold this position for __________ seconds. Let your muscles relax completely before you repeat this exercise. Repeat __________ times. Complete this exercise __________ times a day. Alternating arm and leg raises  Get on your hands and knees on a firm surface. If you are on a hard floor, you may want to use padding, such as an exercise mat, to cushion your knees. Line up your arms and legs. Your hands should be directly below your shoulders, and your knees should be directly below your hips. Lift your left leg behind you. At the same time, raise your right arm and straighten it in front of you. Do not lift your leg higher than your hip. Do not lift your arm higher than your shoulder. Keep your abdominal and back muscles tight. Keep your hips facing the ground. Do not arch your back. Keep your balance carefully, and do not hold your breath. Hold this position for __________ seconds. Slowly return to the starting  position. Repeat with your right leg and your left arm. Repeat __________ times. Complete this exercise __________ times a day. Abdominal set with straight leg raise  Lie on your back on a firm bed or the floor. Bend one of your knees and keep your other leg straight. Tense your abdominal muscles and lift your straight leg up, 4-6 inches (10-15 cm) off the ground. Keep your abdominal muscles tight and hold this position for __________ seconds. Do not hold your breath. Do not arch your back. Keep it flat against the ground. Keep your abdominal muscles tense  as you slowly lower your leg back to the starting position. Repeat with your other leg. Repeat __________ times. Complete this exercise __________ times a day. Single leg lower with bent knees Lie on your back on a firm bed or the floor. Tense your abdominal muscles and lift your feet off the floor, one foot at a time, so your knees and hips are bent in 90-degree angles (right angles). Your knees should be over your hips and your lower legs should be parallel to the floor. Keeping your abdominal muscles tense and your knee bent, slowly lower one of your legs so your toe touches the ground. Lift your leg back up to return to the starting position. Do not hold your breath. Do not let your back arch. Keep your back flat against the ground. Repeat with your other leg. Repeat __________ times. Complete this exercise __________ times a day. Posture and body mechanics Good posture and healthy body mechanics can help to relieve stress in your body's tissues and joints. Body mechanics refers to the movements and positions of your body while you do your daily activities. Posture is part of body mechanics. Good posture means: Your spine is in its natural S-curve position (neutral). Your shoulders are pulled back slightly. Your head is not tipped forward (neutral). Follow these guidelines to improve your posture and body mechanics in your everyday  activities. Standing  When standing, keep your spine neutral and your feet about hip-width apart. Keep a slight bend in your knees. Your ears, shoulders, and hips should line up. When you do a task in which you stand in one place for a long time, place one foot up on a stable object that is 2-4 inches (5-10 cm) high, such as a footstool. This helps keep your spine neutral. Sitting  When sitting, keep your spine neutral and keep your feet flat on the floor. Use a footrest, if necessary, and keep your thighs parallel to the floor. Avoid rounding your shoulders, and avoid tilting your head forward. When working at a desk or a computer, keep your desk at a height where your hands are slightly lower than your elbows. Slide your chair under your desk so you are close enough to maintain good posture. When working at a computer, place your monitor at a height where you are looking straight ahead and you do not have to tilt your head forward or downward to look at the screen. Resting When lying down and resting, avoid positions that are most painful for you. If you have pain with activities such as sitting, bending, stooping, or squatting, lie in a position in which your body does not bend very much. For example, avoid curling up on your side with your arms and knees near your chest (fetal position). If you have pain with activities such as standing for a long time or reaching with your arms, lie with your spine in a neutral position and bend your knees slightly. Try the following positions: Lying on your side with a pillow between your knees. Lying on your back with a pillow under your knees. Lifting  When lifting objects, keep your feet at least shoulder-width apart and tighten your abdominal muscles. Bend your knees and hips and keep your spine neutral. It is important to lift using the strength of your legs, not your back. Do not lock your knees straight out. Always ask for help to lift heavy or  awkward objects. This information is not intended to replace advice given to  you by your health care provider. Make sure you discuss any questions you have with your health care provider. Document Revised: 02/25/2023 Document Reviewed: 01/09/2021 [employer] Patient Education  2024 [employer]Thoracic Strain Rehab Ask your health care provider which exercises are safe for you. Do exercises exactly as told by your provider and adjust them as directed. It is normal to feel mild stretching, pulling, tightness, or discomfort as you do these exercises. Stop right away if you feel sudden pain or your pain gets worse. Do not begin these exercises until told by your provider. Stretching and range-of-motion exercise This exercise warms up your muscles and joints and improves the movement and flexibility of your back and shoulders. This exercise also helps to relieve pain. Chest and spine stretch  Lie down on your back on a firm surface. Roll a towel or a small blanket so it is about 4 inches (10 cm) in diameter. Put the towel under the middle of your back so it is under your spine, but not under your shoulder blades. Put your hands behind your head and let your elbows fall to your sides. This will increase your stretch. Take a deep breath (inhale). Hold for __________ seconds. Relax after you breathe out (exhale). Repeat __________ times. Complete this exercise __________ times a day. Strengthening exercises These exercises build strength and endurance in your back and your shoulder blade muscles. Endurance is the ability to use your muscles for a long time, even after they get tired. Alternating arm and leg raises  Get on your hands and knees on a firm surface. If you are on a hard floor, you may want to use padding, such as an exercise mat, to cushion your knees. Line up your arms and legs. Your hands should be directly below your shoulders, and your knees should be directly below your hips. Lift  your left leg behind you. At the same time, raise your right arm and straighten it in front of you. Do not lift your leg higher than your hip. Do not lift your arm higher than your shoulder. Keep your abdominal and back muscles tight. Keep your hips facing the ground. Do not arch your back. Carefully stay balanced. Do not hold your breath. Hold for __________ seconds. Slowly return to the starting position and repeat with your right leg and your left arm. Repeat __________ times. Complete this exercise __________ times a day. Straight arm rows This exercise is also called the shoulder extension exercise. Stand with your feet shoulder width apart. Secure an exercise band to a stable object in front of you so the band is at or above shoulder height. Hold one end of the exercise band in each hand. Straighten your elbows and lift your hands up to shoulder height. Step back, away from the secured end of the exercise band, until the band stretches. Squeeze your shoulder blades together and pull your hands down to the sides of your thighs. Stop when your hands are straight down by your sides. This is shoulder extension. Do not let your hands go behind your body. Hold for __________ seconds. Slowly return to the starting position. Repeat __________ times. Complete this exercise __________ times a day. Rowing scapular retraction This is an exercise in which the shoulder blades (scapulae) are pulled toward each other (retraction). Sit in a stable chair without armrests, or stand up. Secure an exercise band to a stable object in front of you so the band is at shoulder height. Hold one end of  the exercise band in each hand. Your palms should face toward each other. Bring your arms out straight in front of you. Step back, away from the secured end of the exercise band, until the band stretches. Pull the band backward. As you do this, bend your elbows and squeeze your shoulder blades together, but  avoid letting the rest of your body move. Do not shrug your shoulders upward while you do this. Stop when your elbows are at your sides or slightly behind your body. Hold for __________ seconds. Slowly straighten your arms to return to the starting position. Repeat __________ times. Complete this exercise __________ times a day. Posture and body mechanics Good posture and healthy body mechanics can help to relieve stress in your body's tissues and joints. Body mechanics refers to the movements and positions of your body while you do your daily activities. Posture is part of body mechanics. Good posture means: Your spine is in its natural S-curve position (neutral). Your shoulders are pulled back slightly. Your head is not tipped forward. Follow these guidelines to improve your posture and body mechanics in your everyday activities. Standing  When standing, keep your spine neutral and your feet about hip width apart. Keep a slight bend in your knees. Your ears, shoulders, and hips should line up with each other. When you do a task in which you lean forward while standing in one place for a long time, place one foot up on a stable object that is 2-4 inches (5-10 cm) high, such as a footstool. This helps keep your spine neutral. Sitting  When sitting, keep your spine neutral and keep your feet flat on the floor. Use a footrest if needed. Keep your thighs parallel to the floor. Avoid rounding your shoulders, and avoid tilting your head forward. When working at a desk or a computer, keep your desk at a height where your hands are slightly lower than your elbows. Slide your chair under your desk so you are close enough to maintain good posture. When working at a computer, place your monitor at a height where you are looking straight ahead and you do not have to tilt your head forward or downward to look at the screen. Resting When lying down and resting, avoid positions that are most painful for  you. If you have pain with activities such as sitting, bending, stooping, or squatting (flexion-basedactivities), lie in a position in which your body does not bend very much. For example, avoid curling up on your side with your arms and knees near your chest (fetal position). If you have pain with activities such as standing for a long time or reaching with your arms (extension-basedactivities), lie with your spine in a neutral position and bend your knees slightly. Try the following positions: Lie on your side with a pillow between your knees. Lie on your back with a pillow under your knees.  Lifting  When lifting objects, keep your feet at least shoulder width apart and tighten your abdominal muscles. Bend your knees and hips and keep your spine neutral. It is important to lift using the strength of your legs, not your back. Do not lock your knees straight out. Always ask for help to lift heavy or awkward objects. This information is not intended to replace advice given to you by your health care provider. Make sure you discuss any questions you have with your health care provider. Document Revised: 04/05/2023 Document Reviewed: 06/11/2022 [employer] Patient Education  2024 [employer].

## 2024-09-02 NOTE — Addendum Note (Signed)
 Addended by: YVONE DAVED BROCKS on: 09/02/2024 08:44 AM   Modules accepted: Orders

## 2024-09-17 NOTE — Therapy (Signed)
 OUTPATIENT PHYSICAL THERAPY CERVICAL & THORACOLUMBAR EVALUATION   Patient Name: Virginia Shaw MRN: [id number] DOB:07-17-1966, 58 y.o., female Today's Date: 09/21/2024  END OF SESSION:  PT End of Session - 09/21/24 1103     Visit Number 1    Date for Recertification  12/14/24    Authorization Type [employer]    Authorization Time Period auth pending    Authorization - Visit Number 1    PT Start Time 1103    PT Stop Time 1204    PT Time Calculation (min) 61 min    Activity Tolerance Patient limited by pain    Behavior During Therapy WFL for tasks assessed/performed          Past Medical History:  Diagnosis Date   Abnormal involuntary movements(781.0)    Allergic rhinitis, cause unspecified    Anxiety state, unspecified    Cervical spondylosis without myelopathy    Chronic headaches    Disturbance of skin sensation    Fatty liver    per patient, not sure but does remember being told   GERD (gastroesophageal reflux disease)    History of chicken pox    Irritable bowel syndrome    Low blood pressure    Lumbago    Myalgia and myositis, unspecified    Osteoporosis    Other and unspecified angina pectoris    Other malaise and fatigue    Peripheral vascular disease    per patient diagnosed with it about 13-14 years ago and does not currently see someone for it, saw Dr. 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   no coronary spasm per cardiology notes 2024   Shortness of breath on exertion    per patient, sometimes depends on distance   Sinus arrhythmia    per patient   Somatization disorder    Spasm of muscle    Stroke (HCC) 09/11/2022   Systemic sclerosis (HCC)    TIA (transient ischemic attack)    Unspecified hereditary and idiopathic peripheral neuropathy    Past Surgical History:  Procedure Laterality Date   ABDOMINAL HYSTERECTOMY  1997   ANTERIOR CERVICAL DECOMP/DISCECTOMY FUSION N/A  02/18/2023   Procedure: Anterior Cervical Decompression Fusion - Cervical four-Cervical five;  Surgeon: Louis Shove, MD;  Location: [facility] OR;  Service: Neurosurgery;  Laterality: N/A;  3C   arnold chiari malformation  2013   BACK SURGERY  2001, 2011,2013   Lumbar x 2   BALLOON DILATION N/A 12/22/2019   Procedure: BALLOON DILATION;  Surgeon: Dianna Specking, MD;  Location: [facility];  Service: Endoscopy;  Laterality: N/A;   barium swallow test  10/2019   bladder prolapse surgery  07/09/2023   at [facility] in [place] per patient   BLADDER SURGERY  2011   CARDIAC CATHETERIZATION     CARPAL TUNNEL RELEASE Left    CATARACT EXTRACTION     CERVICAL SPINE SURGERY  2013   CHOLECYSTECTOMY     COLONOSCOPY  2009   Normal Dr Jakie   COLONOSCOPY  2002   Normal Dr Marene   COLONOSCOPY  ~2013   Dr Zulema.  Chest pain with the prep = aborted   COLONOSCOPY WITH PROPOFOL  N/A 03/29/2020   Procedure: COLONOSCOPY WITH PROPOFOL ;  Surgeon: Dianna Specking, MD;  Location: [facility];  Service: Endoscopy;  Laterality: N/A;   CRANIECTOMY SUBOCCIPITAL W/ CERVICAL LAMINECTOMY / CHIARI  2012   Dr Louis, Chiari decompression with suboccipital  craniectomy and   CYSTOCELE REPAIR  2002   Dr Curlene   ESOPHAGOGASTRODUODENOSCOPY (EGD) WITH PROPOFOL  N/A 12/22/2019   Procedure: ESOPHAGOGASTRODUODENOSCOPY (EGD) WITH PROPOFOL ;  Surgeon: Dianna Specking, MD;  Location: [facility];  Service: Endoscopy;  Laterality: N/A;   INSERTION OF MESH Bilateral 10/04/2020   Procedure: INSERTION OF MESH;  Surgeon: Rubin Calamity, MD;  Location: [facility] OR;  Service: General;  Laterality: Bilateral;   OOPHORECTOMY  1999, 2000   PITUITARY EXCISION  2010   pituitary cyst removed   RECTOCELE REPAIR  2002   Dr. Curlene   RENAL BIOPSY  08/04/2019   tear duct tubes  2005   TUBAL LIGATION     VIDEO BRONCHOSCOPY N/A 05/14/2022   Procedure: VIDEO BRONCHOSCOPY WITHOUT FLUORO;  Surgeon: Theophilus Roosevelt,  MD;  Location: [facility];  Service: Cardiopulmonary;  Laterality: N/A;   XI ROBOTIC ASSISTED INGUINAL HERNIA REPAIR WITH MESH Bilateral 10/04/2020   Procedure: XI ROBOTIC ASSISTED BILATERAL INGUINAL HERNIA REPAIR WITH MESH;  Surgeon: Rubin Calamity, MD;  Location: [facility] OR;  Service: General;  Laterality: Bilateral;   Patient Active Problem List   Diagnosis Date Noted   Acute sinusitis 06/18/2023   Cervical spondylosis with myelopathy and radiculopathy 02/18/2023   Reactive airway disease 01/23/2023   Preoperative respiratory examination 01/23/2023   Hemoptysis 01/05/2022   Special screening for malignant neoplasms, colon 03/29/2020   Dysphagia 12/22/2019   DDD (degenerative disc disease), cervical 08/11/2018   DDD (degenerative disc disease), lumbar 08/11/2018   Somatic complaints, multiple 03/14/2017   Chronic pain syndrome 08/25/2014   Irritable bowel syndrome (IBS) 01/12/2014   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   Chronic abdominal pain 01/11/2014   Chronic venous insufficiency 10/30/2013   Varicose veins 10/30/2013   Chest tightness 03/25/2012   PITUITARY ADENOMA, 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   RECTAL BLEEDING 08/26/2008   UTI'S, RECURRENT 08/26/2008    PCP: Claudene Pellet, MD   REFERRING PROVIDER: Dolphus Reiter, MD   REFERRING DIAG:  M79.7 (ICD-10-CM) - Fibromyalgia  M50.30 (ICD-10-CM) - DDD (degenerative disc disease), cervical  M54.6 (ICD-10-CM) - Pain in thoracic spine  M54.16 (ICD-10-CM) - Radiculopathy, lumbar region   THERAPY DIAG:  Cervicalgia  Radiculopathy, cervicothoracic region  Pain in thoracic spine  Chronic left-sided low back pain with left-sided sciatica  Abnormal posture  Muscle weakness (generalized)  RATIONALE FOR EVALUATION AND TREATMENT: Rehabilitation  ONSET DATE: Chronic, exacerbation over the last ~8 months  NEXT MD VISIT: 09/02/25; 61-month check-up with neurosurgeon in Dec 2025   SUBJECTIVE:  SUBJECTIVE STATEMENT: Pt reports her neck and hands are the biggest issue right now - disturbing her sleep. Also has chronic upper and lower back pain.  She reports intermittent but daily numbness and tingling in L side of face, B hands and L LE.  When she tries to hold an object for a while, her fingers lock up, worse in winter than summer.  Her issues are chronic with current exacerbation occurring over the last 8 months.  Last neck surgery was in 02/2023.   PAIN: Are you having pain? Yes: NPRS scale: 7/10 currently, up to 10/10 at worst  Pain location: L neck- upper under occiput down to shoulder  Pain description: constant dull ache, occasional pressure or intermittent sharp, stabbing pain above L ear Aggravating factors: turning head/neck, vacuuming/mopping, driving  Relieving factors: rubbing, ice, lying down in comfortable position   Are you having pain? Yes: NPRS scale: 5/10 currently, up to 9/10 at worst  Pain location: midline thoracic spine between scapula radiating laterally along L lower scapula and up to L neck Pain description: tightness, squeezing; occasional brief sharp, stabbing pain  Aggravating factors: varies, reaching down to put shoes on, turning  Relieving factors: stretch and rest, let it run its course   Are you having pain? Yes: NPRS scale: 5/10 currently, up to 10/10 at worst  Pain location: L lower back, extending into L hip, groin and LE to L calf/ankle Pain description: sharp at times; dull, pressure  Aggravating factors: prolonged sitting  Relieving factors: moving around   PERTINENT HISTORY:  Lumbar surgery x 3 including S1 laminotomy with R microdiskectomy performed in 07/2000 by Dr. Alix and left L4-5 discectomy in 10/2012 by Dr. Louis, C5-6 and C6-7 ACDF by Dr. Louis in 01/2012, cervical and lumbar DDD,  cervical spondylosis with myelopathy and radiculopathy, Chiari malformation decompression surgery 11/2008, fibromyalgia, osteoporosis, osteoarthritis, h/o TIA, CAD, angina, GERD, IBS, CKD, idiopathic peripheral neuropathy, chronic pain, chronic headaches anxiety, systemic sclerosis, somatization disorder  PRECAUTIONS: None  RED FLAGS: Bowel or bladder incontinence: Yes: urinary incontinence with h/o prolapse surgery in 07/2023  HAND DOMINANCE: Right  WEIGHT BEARING RESTRICTIONS: No  FALLS:  Has patient fallen in last 6 months? No  LIVING ENVIRONMENT: Lives with: lives with their daughter Lives in: House Stairs: Yes: External: 3 steps; fence on L Has following equipment at home: Single point cane and Walker - 2 wheeled (not currently used)  OCCUPATION: On disability  PLOF: Independent with basic ADLs, Independent with household mobility without device, Independent with community mobility without device, Needs assistance with homemaking, and Leisure: time with daughters and grandbabies   PATIENT GOALS: Better mobility, less pain, be able to do more with my kids and grandson. I would love to be able to work.   OBJECTIVE: (objective measures completed at initial evaluation unless otherwise dated)  DIAGNOSTIC FINDINGS:  11/29/22 - XR lumbar spine complete: Independently reviewed and interpreted by Dr. Ozell Ada, showing  disc height loss at L4/5 and L5/S1.  No fracture or dislocation.  No evidence of instability on flexion/extension views.   05/01/22 - MRI LUMBAR SPINE WITHOUT CONTRAST CLINICAL DATA:  Low back pain radiating to left lower extremity for 2 years. History of surgery most recently in 2012   FINDINGS: Segmentation: Standard; the lowest formed disc space is designated L5-S1.   Alignment: There is trace retrolisthesis of L4 on L5, unchanged. Alignment is otherwise normal.   Vertebrae: Vertebral body heights are preserved. Marrow signal is normal. There is no  suspicious  marrow signal abnormality or marrow edema. Postsurgical changes reflecting left laminectomy at L4-L5 are noted.   Conus medullaris and cauda equina: Conus extends to the T12-L1 level. Conus and cauda equina appear normal.   Paraspinal and other soft tissues: A subcentimeter T2 hyperintense lesion in the left kidney lower pole likely reflects a small cyst, for which no specific imaging follow-up is required.   Disc levels:   T12-L1: No significant spinal canal or neural foraminal stenosis.   L1-L2: No significant spinal canal or neural foraminal stenosis   L2-L3: No significant spinal canal or neural foraminal stenosis   L3-L4: No significant spinal canal or neural foraminal stenosis   L4-L5: Status post left laminectomy. There is disc desiccation and narrowing with a mild bulge and mild bilateral facet arthropathy resulting in mild left and no significant right neural foraminal stenosis and no significant spinal canal stenosis. There is no evidence of nerve root impingement in the subarticular zones.   L5-S1: There is disc desiccation and narrowing with a mild bulge, degenerative endplate spurring, and mild bilateral facet arthropathy resulting in mild bilateral neural foraminal stenosis without significant spinal canal stenosis   The above findings are overall not significantly changed since 2020.   IMPRESSION: 1. Unchanged disc degeneration and mild facet arthropathy at L4-L5 and L5-S1 resulting in mild neural foraminal stenosis in the left at L4-L5 and bilaterally at L5-S1. No evidence of high-grade spinal canal or neural foraminal stenosis or nerve root impingement. 2. Overall, no significant interval change since 08/10/2019.  *MRI of the lumbar spine from 05/01/22 was independently reviewed and interpreted by Dr. Ozell Ada on 11/29/22, showing DDD at L4/5 and L5/S1. No significant central or lateral recess stenosis. Mild L4/5 and L5/S1 foraminal stenosis on  the left. Hemilaminotomy defects at L4/5 and L5/S1.   02/14/24 - MRI HEAD WITHOUT CONTRAST CLINICAL DATA:  Neuro deficit, acute, stroke suspected. Transient left eye visual loss. Multiple episodes since yesterday.   FINDINGS: Brain: No acute infarct or hemorrhage. No mass or midline shift. No hydrocephalus or extra-axial collection. No abnormal susceptibility.   Vascular: Normal flow voids.   Skull and upper cervical spine: Susceptibility artifact in the upper cervical spine from prior ACDF. Otherwise normal marrow signal.   Sinuses/Orbits: No acute findings.   Other: None.   IMPRESSION: No acute intracranial process.  PATIENT SURVEYS:  Modified Oswestry:  MODIFIED OSWESTRY DISABILITY SCALE Date:  09/21/2024   Pain intensity 3 =  Pain medication provides me with moderate relief from pain.  2. Personal care (washing, dressing, etc.) 3 =  I need help, but I am able to manage most of my personal care.  3. Lifting 3 = Pain prevents me from lifting heavy weights, but I can manage light to medium weights if they are conveniently positioned  4. Walking 2 =  Pain prevents me from walking more than  mile.  5. Sitting 4 =  Pain prevents me from sitting more than 10 minutes.  6. Standing 3 =  Pain prevents me from standing more than 1/2 hour.  7. Sleeping 4 =  Even when I take pain medication, I sleep less than 2 hour  8. Social Life 0 = My social life is normal and does not increase my pain.  9. Traveling 3 = My pain restricts my travel over 1 hour  10. Employment/ Homemaking 3 = Pain prevents me from doing anything but light duties.  Total Score: 28/50  % Disability  56.0 % - severe  Interpretation of scores: Score Category Description  0-20% Minimal Disability The patient can cope with most living activities. Usually no treatment is indicated apart from advice on lifting, sitting and exercise  21-40% Moderate Disability The patient experiences more pain and difficulty with sitting,  lifting and standing. Travel and social life are more difficult and they may be disabled from work. Personal care, sexual activity and sleeping are not grossly affected, and the patient can usually be managed by conservative means  41-60% Severe Disability Pain remains the main problem in this group, but activities of daily living are affected. These patients require a detailed investigation  61-80% Crippled Back pain impinges on all aspects of the patient's life. Positive intervention is required  81-100% Bed-bound These patients are either bed-bound or exaggerating their symptoms  Bluford FORBES Zoe DELENA Karon DELENA, et al. Surgery versus conservative management of stable thoracolumbar fracture: the PRESTO feasibility RCT. [place] ([place]): [employer]; 2021 Nov. [facility] Technology Assessment, No. 25.62.) Appendix 3, Oswestry Disability Index category descriptors. Available from: Findjewelers.cz  Minimally Clinically Important Difference (MCID) = 12.8%  NDI:  NECK DISABILITY INDEX Date:  09/21/2024   Pain intensity 2 = The pain is moderate at the moment  2. Personal care (washing, dressing, etc.) 2 = It is painful to look after myself and I am slow and careful  3. Lifting 3 = Pain prevents me from lifting heavy weights but I can manage light to medium   weights if they are conveniently positioned  4. Reading 3 = I can't read as much as I want because of moderate pain in my neck  5. Headaches 1 =  I have slight headaches, which come infrequently  6. Concentration 0 =  I can concentrate fully when I want to with no difficulty  7. Work 3 =  I cannot do my usual work  8. Driving 3 = I can't drive my car as long as I want because of moderate pain in my neck  9. Sleeping 4 = My sleep is greatly disturbed (3-5 hrs sleepless)   10. Recreation 4 =  I can hardly do any recreation activities because of pain in my neck  Total 25/50  % Disability  50.0 % - severe    Minimum Detectable Change (90% confidence): 5 points or 10% points  SCREENING FOR RED FLAGS: Bowel or bladder incontinence: Yes: urinary incontinence with h/o prolapse surgery in 07/2023 Spinal tumors: No Cauda equina syndrome: No Compression fracture: No Abdominal aneurysm: No  COGNITION:  Overall cognitive status: Within functional limits for tasks assessed    SENSATION: Intermittent numbness and tingling in L side of face, B hands and L LE  POSTURE:  rounded shoulders, forward head, decreased lumbar lordosis, increased thoracic kyphosis, and flexed trunk   PALPATION: Increased muscle tension and TTP in B cervical, thoracic and lumbar paraspinals, B UT and LS, B pecs  CERVICAL ROM:   Active ROM eval  Flexion 21  Extension 18  Right lateral flexion 17  Left lateral flexion 6*  Right rotation 23  Left rotation 11*   (Blank rows = not tested, *tingling into L side of face)  UPPER EXTREMITY ROM:  Active ROM Right eval Left eval  Shoulder flexion 106 101  Shoulder extension 21 19  Shoulder abduction 102 95  Shoulder adduction    Shoulder internal rotation L3 S1  Shoulder external rotation C7 C7  Elbow flexion    Elbow extension    Wrist flexion  Wrist extension    Wrist ulnar deviation    Wrist radial deviation    Wrist pronation    Wrist supination     (Blank rows = not tested)  UPPER EXTREMITY MMT: TBA next visit  MMT Right eval Left eval  Shoulder flexion    Shoulder extension    Shoulder abduction    Shoulder adduction    Shoulder internal rotation    Shoulder external rotation    Middle trapezius    Lower trapezius    Elbow flexion    Elbow extension    Wrist flexion    Wrist extension    Wrist ulnar deviation    Wrist radial deviation    Wrist pronation    Wrist supination    Grip strength     (Blank rows = not tested)  LUMBAR ROM:   Active  Eval  Flexion Hands to lower thighs - motion mostly thoracic flexion  Extension 75% limited  - motion mostly lumbar extension  Right lateral flexion Hand to 3 above knee - severe sharp pain in R UT  Left lateral flexion Hand to 3 above knee  Right rotation 75% limited  Left rotation 50% limited  (Blank rows = not tested)  MUSCLE LENGTH:  TBA next visit Hamstrings:  ITB:  Piriformis:  Hip flexors:  Quads:  Heelcord:   LOWER EXTREMITY ROM:  TBA next visit  Active  Right eval Left eval  Hip flexion    Hip extension    Hip abduction    Hip adduction    Hip internal rotation    Hip external rotation    Knee flexion    Knee extension    Ankle dorsiflexion    Ankle plantarflexion    Ankle inversion    Ankle eversion    (Blank rows = not tested)  LOWER EXTREMITY MMT:  TBA next visit  MMT Right eval Left eval  Hip flexion    Hip extension    Hip abduction    Hip adduction    Hip internal rotation    Hip external rotation    Knee flexion    Knee extension    Ankle dorsiflexion    Ankle plantarflexion    Ankle inversion    Ankle eversion     (Blank rows = not tested)  CERVICAL SPECIAL TESTS:  TBA as indicated  LUMBAR SPECIAL TESTS:  TBA as indicated  FUNCTIONAL TESTS:  TBA next visit 5 times sit to stand:      TODAY'S TREATMENT:   09/21/2024 - Eval SELF CARE:  Reviewed eval findings and role of PT in addressing identified deficits as well as postural awareness and instruction in initial HEP (see below).   MANUAL THERAPY: To promote normalized muscle tension, improved flexibility, pain modulation, and reduced pain utilizing connective tissue massage, therapeutic massage, manual TP therapy, and myofascial release.  STM/DTM and manual TPR to address acute TP in R UT aggravated during lumbar ROM assessment  NEUROMUSCULAR RE-EDUCATION: To improve coordination, kinesthesia, and posture. VC and gentle TC/facilitation of cervical and scapular retraction to promote neutral posture for normalization of muscle tension   PATIENT EDUCATION:  Education  details: PT eval findings, anticipated POC, need for further assessment of UE/LE MMT, lumbopelvic/LE flexiblity, and initial HEP  Person educated: Patient Education method: Explanation, Demonstration, Verbal cues, Tactile cues, and Handouts Education comprehension: verbalized understanding, returned demonstration, verbal cues required, tactile cues required, and needs further education  HOME EXERCISE PROGRAM: Access Code: [email or web address] URL: [email or web address]  Date: 09/21/2024 Prepared by: Elijah Hidden  Exercises - Seated Cervical Retraction  - 2 x daily - 7 x weekly - 2 sets - 10 reps - 3-5 sec hold - Seated Scapular Retraction  - 2 x daily - 7 x weekly - 2 sets - 10 reps - 3-5 sec hold - Standing Backward Shoulder Rolls  - 2 x daily - 7 x weekly - 2 sets - 10 reps  *2023 HEP MedBridge access Code: [email or web address]   ASSESSMENT:  CLINICAL IMPRESSION: Virginia Shaw is a 58 y.o. female who was referred to physical therapy for evaluation and treatment for fibromyalgia and chronic neck and thoracolumbar back pain.  She was a previously evaluated at this clinic in 2023 but requested to be discharged from PT after only 2 visits stating PT was not beneficial to her.  Patient reports onset of current exacerbation of her pain beginning ~8 months ago with area of greatest concern being her neck pain and hand pain/numbness/tingling with feeling that her fingers lock up at times.  Neck and thoracolumbar pain are worse with turning head/neck, reaching down, prolonged sitting, vacuuming/mopping, and driving.  Patient has deficits in cervical, thoracolumbar and B shoulder ROM; neck, shoulder and proximal LE (anticipated but not yet assessed) flexibility; B UE and LE strength (anticipated based on functional mobility, but not yet assessed), abnormal posture, and TTP with abnormal muscle tension in several muscles which are interfering with ADLs and are impacting quality of life.  On NDI patient scored  25/50 demonstrating 50% or severe disability.  On Modified Oswestry patient scored 28/50 demonstrating 56% or severe disability.  Pain and increased muscle spasms during assessment today limited the degree of assessment, with further assessment to be completed next visit.  Virginia Shaw will benefit from skilled PT to address above deficits to improve mobility and activity tolerance with decreased pain interference.  Given extent of body system involvement recommended 2x/wk frequency, however patient stating she can only manage 1x/wk.  She is aware that at 1x/wk, there will be a heavy emphasis on the HEP.  OBJECTIVE IMPAIRMENTS: decreased activity tolerance, decreased coordination, decreased knowledge of condition, decreased mobility, difficulty walking, decreased ROM, decreased strength, increased fascial restrictions, impaired perceived functional ability, increased muscle spasms, impaired flexibility, impaired UE functional use, improper body mechanics, postural dysfunction, and pain.   ACTIVITY LIMITATIONS: carrying, lifting, bending, sitting, standing, squatting, sleeping, transfers, bed mobility, bathing, dressing, reach over head, hygiene/grooming, locomotion level, and caring for others  PARTICIPATION LIMITATIONS: meal prep, cleaning, laundry, driving, shopping, community activity, occupation, and yard work  PERSONAL FACTORS: Fitness, Past/current experiences, Time since onset of injury/illness/exacerbation, and 3+ comorbidities: Lumbar surgery x 3 including S1 laminotomy with R microdiskectomy performed in 07/2000 by Dr. Alix and left L4-5 discectomy in 10/2012 by Dr. Louis, C5-6 and C6-7 ACDF by Dr. Louis in 01/2012, cervical and lumbar DDD, cervical spondylosis with myelopathy and radiculopathy, Chiari malformation decompression surgery 11/2008, fibromyalgia, osteoporosis, osteoarthritis, h/o TIA, CAD, angina, GERD, IBS, CKD, idiopathic peripheral neuropathy, chronic pain, chronic headaches anxiety,  systemic sclerosis, somatization disorder are also affecting patient's functional outcome.   REHAB POTENTIAL: Good  CLINICAL DECISION MAKING: Unstable/unpredictable  EVALUATION COMPLEXITY: High   GOALS: Goals reviewed with patient? Yes  SHORT TERM GOALS: Target date: 11/02/2024  Patient will be independent with initial HEP to improve outcomes and carryover.  Baseline:  Goal status: INITIAL  2.  Patient will report 25% improvement in neck and thoracolumbar back pain to improve QOL. Baseline: Neck - 7/10, up  to 10/10, Thoracolumbar - 5/10, up to 9-10/10 Goal status: INITIAL  LONG TERM GOALS: Target date: 12/14/2024  Patient will be independent with ongoing/advanced HEP for self-management at home.  Baseline:  Goal status: INITIAL  2.  Patient will report 50-75% improvement in neck and thoracolumbar back pain to improve QOL.  Baseline: Neck - 7/10, up to 10/10, Thoracolumbar - 5/10, up to 9-10/10 Goal status: INITIAL  3.  Patient will demonstrate improved posture to decrease muscle imbalance. Baseline: severe forward head, rounded shoulders, increased thoracic kyphosis and decreased lumbar lordosis Goal status: INITIAL  4.  Patient to demonstrate ability to achieve and maintain good spinal alignment and body mechanics needed for daily activities. Baseline: increased pain with vacuuming and mopping, unable to lift Goal status: INITIAL  5.  Patient will demonstrate full pain free cervical ROM for safety with driving.  Baseline: Refer to above cervical ROM table Goal status: INITIAL  6.  Patient will demonstrate full pain free lumbar ROM to perform ADLs.   Baseline: Refer to above lumbar ROM table Goal status: INITIAL  7.  Patient will demonstrate improved functional strength as demonstrated by improved B UE/LE strength to >/= 4/5. Baseline: Refer to above UE/LE MMT tables Goal status: INITIAL  8.  Patient will report </= 40% on NDI (MCID = 10%) to demonstrate improved  functional ability.  Baseline: 25 / 50 = 50.0 % Goal status: INITIAL   9.  Patient will report </= 44% on Modified Oswestry (MCID = 12%) to demonstrate improved functional ability.  Baseline: 28 / 50 = 56.0 % Goal status: INITIAL  10.  Patient to report ability to perform ADLs, household, and work-related tasks without limitation due to neck or thoracolumbar pain >5/10, LOM or weakness. Baseline:  Goal status: INITIAL   11.  Patient report at least 50% reduction in sleep disturbance due to neck and/or thoracolumbar back pain. Baseline: pain limiting sleep to 1/4 of normal amount/disturbed up to 3-5 hrs Goal status: INITIAL   PLAN:  PT FREQUENCY: 1-2x/week  PT DURATION: 12 weeks  PLANNED INTERVENTIONS: 97164- PT Re-evaluation, 97750- Physical Performance Testing, 97110-Therapeutic exercises, 97530- Therapeutic activity, 97112- Neuromuscular re-education, 97535- Self Care, 02859- Manual therapy, [phone]- Gait training, [phone]- Aquatic Therapy, [phone]- Electrical stimulation (unattended), [id number]- Ultrasound, [id number]- Ionotophoresis 4mg /ml Dexamethasone , 79439 (1-2 muscles), 20561 (3+ muscles)- Dry Needling, Patient/Family education, Balance training, Taping, Joint mobilization, Spinal mobilization, Cryotherapy, and Moist heat  PLAN FOR NEXT SESSION: Complete LE ROM/flexibility assessment, UE/LE MMT and special test as indicated; review and expand on initial HEP   Krishna Dancel M Jerrik Housholder, PT 09/21/2024, 1:17 PM

## 2024-09-18 ENCOUNTER — Telehealth: Payer: Self-pay

## 2024-09-18 ENCOUNTER — Other Ambulatory Visit: Payer: Self-pay | Admitting: Obstetrics and Gynecology

## 2024-09-18 ENCOUNTER — Encounter: Payer: Self-pay | Admitting: Obstetrics and Gynecology

## 2024-09-18 DIAGNOSIS — R1032 Left lower quadrant pain: Secondary | ICD-10-CM

## 2024-09-18 NOTE — Progress Notes (Signed)
 Spoke with pt at great length regarding her Iodine contrast allergy and her allergy / intolerance to Prednisone . During conversation, pt stated she also has a intolerance to Benedryl and she is not willing to take other steroids. She stated she is only willing to take very small doses or Prednisone  over the course of days, but even that has limits. Pt aware that we will have to delay CT scan for now until the scheduler and/or herslf can speak with the ordering Dr regarding doing the scan without contrast.

## 2024-09-21 ENCOUNTER — Other Ambulatory Visit: Payer: Self-pay

## 2024-09-21 ENCOUNTER — Encounter: Payer: Self-pay | Admitting: Physical Therapy

## 2024-09-21 ENCOUNTER — Ambulatory Visit: Attending: Rheumatology | Admitting: Physical Therapy

## 2024-09-21 ENCOUNTER — Other Ambulatory Visit: Payer: Self-pay | Admitting: Obstetrics and Gynecology

## 2024-09-21 DIAGNOSIS — M797 Fibromyalgia: Secondary | ICD-10-CM | POA: Diagnosis not present

## 2024-09-21 DIAGNOSIS — R293 Abnormal posture: Secondary | ICD-10-CM | POA: Insufficient documentation

## 2024-09-21 DIAGNOSIS — M5416 Radiculopathy, lumbar region: Secondary | ICD-10-CM | POA: Insufficient documentation

## 2024-09-21 DIAGNOSIS — M546 Pain in thoracic spine: Secondary | ICD-10-CM | POA: Insufficient documentation

## 2024-09-21 DIAGNOSIS — M6281 Muscle weakness (generalized): Secondary | ICD-10-CM | POA: Insufficient documentation

## 2024-09-21 DIAGNOSIS — G8929 Other chronic pain: Secondary | ICD-10-CM | POA: Insufficient documentation

## 2024-09-21 DIAGNOSIS — M5030 Other cervical disc degeneration, unspecified cervical region: Secondary | ICD-10-CM | POA: Insufficient documentation

## 2024-09-21 DIAGNOSIS — M542 Cervicalgia: Secondary | ICD-10-CM | POA: Insufficient documentation

## 2024-09-21 DIAGNOSIS — M5413 Radiculopathy, cervicothoracic region: Secondary | ICD-10-CM | POA: Diagnosis present

## 2024-09-21 DIAGNOSIS — M5442 Lumbago with sciatica, left side: Secondary | ICD-10-CM | POA: Insufficient documentation

## 2024-09-21 DIAGNOSIS — R1032 Left lower quadrant pain: Secondary | ICD-10-CM

## 2024-09-22 ENCOUNTER — Inpatient Hospital Stay: Admission: RE | Admit: 2024-09-22 | Source: Ambulatory Visit

## 2024-09-24 ENCOUNTER — Ambulatory Visit
Admission: RE | Admit: 2024-09-24 | Discharge: 2024-09-24 | Disposition: A | Discharge status: Home / Self Care | Source: Ambulatory Visit | Attending: Obstetrics and Gynecology | Admitting: Obstetrics and Gynecology

## 2024-09-24 DIAGNOSIS — R1032 Left lower quadrant pain: Secondary | ICD-10-CM

## 2024-10-05 ENCOUNTER — Ambulatory Visit: Attending: Rheumatology | Admitting: Physical Therapy

## 2024-10-05 ENCOUNTER — Encounter: Payer: Self-pay | Admitting: Physical Therapy

## 2024-10-05 DIAGNOSIS — M5442 Lumbago with sciatica, left side: Secondary | ICD-10-CM | POA: Diagnosis present

## 2024-10-05 DIAGNOSIS — G8929 Other chronic pain: Secondary | ICD-10-CM | POA: Diagnosis present

## 2024-10-05 DIAGNOSIS — R293 Abnormal posture: Secondary | ICD-10-CM | POA: Insufficient documentation

## 2024-10-05 DIAGNOSIS — M6281 Muscle weakness (generalized): Secondary | ICD-10-CM | POA: Insufficient documentation

## 2024-10-05 DIAGNOSIS — M546 Pain in thoracic spine: Secondary | ICD-10-CM | POA: Diagnosis present

## 2024-10-05 DIAGNOSIS — M542 Cervicalgia: Secondary | ICD-10-CM | POA: Insufficient documentation

## 2024-10-05 DIAGNOSIS — M5413 Radiculopathy, cervicothoracic region: Secondary | ICD-10-CM | POA: Insufficient documentation

## 2024-10-05 NOTE — Therapy (Signed)
 OUTPATIENT PHYSICAL THERAPY TREATMENT   Patient Name: Virginia Shaw MRN: [id number] DOB:12-Sep-1966, 58 y.o., female Today's Date: 10/05/2024  END OF SESSION:  PT End of Session - 10/05/24 1400     Visit Number 2    Date for Recertification  12/14/24    Authorization Type [facility] Medicaid    Authorization Time Period 09/21/24 - 12/15/23    Authorization - Visit Number 2    Authorization - Number of Visits 16   52 units = ~16 visits   PT Start Time 1400    PT Stop Time 1448    PT Time Calculation (min) 48 min    Activity Tolerance Patient limited by pain    Behavior During Therapy WFL for tasks assessed/performed           Past Medical History:  Diagnosis Date   Abnormal involuntary movements(781.0)    Allergic rhinitis, cause unspecified    Anxiety state, unspecified    Cervical spondylosis without myelopathy    Chronic headaches    Disturbance of skin sensation    Fatty liver    per patient, not sure but does remember being told   GERD (gastroesophageal reflux disease)    History of chicken pox    Irritable bowel syndrome    Low blood pressure    Lumbago    Myalgia and myositis, unspecified    Osteoporosis    Other and unspecified angina pectoris    Other malaise and fatigue    Peripheral vascular disease    per patient diagnosed with it about 13-14 years ago and does not currently see someone for it, saw Dr. 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   no coronary spasm per cardiology notes 2024   Shortness of breath on exertion    per patient, sometimes depends on distance   Sinus arrhythmia    per patient   Somatization disorder    Spasm of muscle    Stroke (HCC) 09/11/2022   Systemic sclerosis (HCC)    TIA (transient ischemic attack)    Unspecified hereditary and idiopathic peripheral neuropathy    Past Surgical History:  Procedure Laterality Date   ABDOMINAL HYSTERECTOMY  1997    ANTERIOR CERVICAL DECOMP/DISCECTOMY FUSION N/A 02/18/2023   Procedure: Anterior Cervical Decompression Fusion - Cervical four-Cervical five;  Surgeon: Louis Shove, MD;  Location: [facility] OR;  Service: Neurosurgery;  Laterality: N/A;  3C   arnold chiari malformation  2013   BACK SURGERY  2001, 2011,2013   Lumbar x 2   BALLOON DILATION N/A 12/22/2019   Procedure: BALLOON DILATION;  Surgeon: Dianna Specking, MD;  Location: [facility];  Service: Endoscopy;  Laterality: N/A;   barium swallow test  10/2019   bladder prolapse surgery  07/09/2023   at [facility] in [place] per patient   BLADDER SURGERY  2011   CARDIAC CATHETERIZATION     CARPAL TUNNEL RELEASE Left    CATARACT EXTRACTION     CERVICAL SPINE SURGERY  2013   CHOLECYSTECTOMY     COLONOSCOPY  2009   Normal Dr Jakie   COLONOSCOPY  2002   Normal Dr Marene   COLONOSCOPY  ~2013   Dr Zulema.  Chest pain with the prep = aborted   COLONOSCOPY WITH PROPOFOL  N/A 03/29/2020   Procedure: COLONOSCOPY WITH PROPOFOL ;  Surgeon: Dianna Specking, MD;  Location: [facility];  Service: Endoscopy;  Laterality: N/A;   CRANIECTOMY SUBOCCIPITAL W/  CERVICAL LAMINECTOMY / CHIARI  2012   Dr Louis, Chiari decompression with suboccipital craniectomy and   CYSTOCELE REPAIR  2002   Dr Curlene   ESOPHAGOGASTRODUODENOSCOPY (EGD) WITH PROPOFOL  N/A 12/22/2019   Procedure: ESOPHAGOGASTRODUODENOSCOPY (EGD) WITH PROPOFOL ;  Surgeon: Dianna Specking, MD;  Location: [facility];  Service: Endoscopy;  Laterality: N/A;   INSERTION OF MESH Bilateral 10/04/2020   Procedure: INSERTION OF MESH;  Surgeon: Rubin Calamity, MD;  Location: [facility] OR;  Service: General;  Laterality: Bilateral;   OOPHORECTOMY  1999, 2000   PITUITARY EXCISION  2010   pituitary cyst removed   RECTOCELE REPAIR  2002   Dr. Curlene   RENAL BIOPSY  08/04/2019   tear duct tubes  2005   TUBAL LIGATION     VIDEO BRONCHOSCOPY N/A 05/14/2022   Procedure: VIDEO  BRONCHOSCOPY WITHOUT FLUORO;  Surgeon: Theophilus Roosevelt, MD;  Location: [facility];  Service: Cardiopulmonary;  Laterality: N/A;   XI ROBOTIC ASSISTED INGUINAL HERNIA REPAIR WITH MESH Bilateral 10/04/2020   Procedure: XI ROBOTIC ASSISTED BILATERAL INGUINAL HERNIA REPAIR WITH MESH;  Surgeon: Rubin Calamity, MD;  Location: [facility] OR;  Service: General;  Laterality: Bilateral;   Patient Active Problem List   Diagnosis Date Noted   Acute sinusitis 06/18/2023   Cervical spondylosis with myelopathy and radiculopathy 02/18/2023   Reactive airway disease 01/23/2023   Preoperative respiratory examination 01/23/2023   Hemoptysis 01/05/2022   Special screening for malignant neoplasms, colon 03/29/2020   Dysphagia 12/22/2019   DDD (degenerative disc disease), cervical 08/11/2018   DDD (degenerative disc disease), lumbar 08/11/2018   Somatic complaints, multiple 03/14/2017   Chronic pain syndrome 08/25/2014   Irritable bowel syndrome (IBS) 01/12/2014   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   Chronic abdominal pain 01/11/2014   Chronic venous insufficiency 10/30/2013   Varicose veins 10/30/2013   Chest tightness 03/25/2012   PITUITARY ADENOMA, 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   RECTAL BLEEDING 08/26/2008   UTI'S, RECURRENT 08/26/2008    PCP: Claudene Pellet, MD   REFERRING PROVIDER: Dolphus Reiter, MD   REFERRING DIAG:  M79.7 (ICD-10-CM) - Fibromyalgia  M50.30 (ICD-10-CM) - DDD (degenerative disc disease), cervical  M54.6 (ICD-10-CM) - Pain in thoracic spine  M54.16 (ICD-10-CM) - Radiculopathy, lumbar region   THERAPY DIAG:  Cervicalgia  Radiculopathy, cervicothoracic region  Pain in thoracic spine  Chronic left-sided low back pain with left-sided sciatica  Abnormal posture  Muscle weakness (generalized)  RATIONALE FOR EVALUATION AND TREATMENT: Rehabilitation  ONSET DATE: Chronic, exacerbation over the last ~8 months  NEXT MD VISIT: 09/02/25; 91-month check-up with neurosurgeon in Dec  2025   SUBJECTIVE:  SUBJECTIVE STATEMENT: Pt reports she feels like she is able to move her neck more and does not feel as tense, but feels like lack of tension makes it harder to control her posture.  EVAL: Pt reports her neck and hands are the biggest issue right now - disturbing her sleep. Also has chronic upper and lower back pain.  She reports intermittent but daily numbness and tingling in L side of face, B hands and L LE.  When she tries to hold an object for a while, her fingers lock up, worse in winter than summer.  Her issues are chronic with current exacerbation occurring over the last 8 months.  Last neck surgery was in 02/2023.   PAIN: Are you having pain? Yes: NPRS scale: 5/10   Pain location: L neck- upper under occiput down to shoulder  Pain description: constant dull ache, occasional pressure or intermittent sharp, stabbing pain above L ear Aggravating factors: turning head/neck, vacuuming/mopping, driving  Relieving factors: rubbing, ice, lying down in comfortable position   Are you having pain? Yes: NPRS scale: 6/10   Pain location: midline B thoracic spine between scapula radiating laterally along L lower scapula and up to L neck Pain description: tightness, squeezing; occasional brief sharp, stabbing pain  Aggravating factors: varies, reaching down to put shoes on, turning  Relieving factors: stretch and rest, let it run its course   Are you having pain? Yes: NPRS scale: 6/10   Pain location: L lower back, extending into L hip, groin and LE to L calf/ankle Pain description: sharp at times; dull, pressure  Aggravating factors: prolonged sitting  Relieving factors: moving around   PERTINENT HISTORY:  Lumbar surgery x 3 including S1 laminotomy with R microdiskectomy  performed in 07/2000 by Dr. Alix and left L4-5 discectomy in 10/2012 by Dr. Louis, C5-6 and C6-7 ACDF by Dr. Louis in 01/2012, cervical and lumbar DDD, cervical spondylosis with myelopathy and radiculopathy, Chiari malformation decompression surgery 11/2008, fibromyalgia, osteoporosis, osteoarthritis, h/o TIA, CAD, angina, GERD, IBS, CKD, idiopathic peripheral neuropathy, chronic pain, chronic headaches anxiety, systemic sclerosis, somatization disorder  PRECAUTIONS: None  RED FLAGS: Bowel or bladder incontinence: Yes: urinary incontinence with h/o prolapse surgery in 07/2023  HAND DOMINANCE: Right  WEIGHT BEARING RESTRICTIONS: No  FALLS:  Has patient fallen in last 6 months? No  LIVING ENVIRONMENT: Lives with: lives with their daughter Lives in: House Stairs: Yes: External: 3 steps; fence on L Has following equipment at home: Single point cane and Walker - 2 wheeled (not currently used)  OCCUPATION: On disability  PLOF: Independent with basic ADLs, Independent with household mobility without device, Independent with community mobility without device, Needs assistance with homemaking, and Leisure: time with daughters and grandbabies   PATIENT GOALS: Better mobility, less pain, be able to do more with my kids and grandson. I would love to be able to work.   OBJECTIVE: (objective measures completed at initial evaluation unless otherwise dated)  DIAGNOSTIC FINDINGS:  11/29/22 - XR lumbar spine complete: Independently reviewed and interpreted by Dr. Ozell Ada, showing  disc height loss at L4/5 and L5/S1.  No fracture or dislocation.  No evidence of instability on flexion/extension views.   05/01/22 - MRI LUMBAR SPINE WITHOUT CONTRAST CLINICAL DATA:  Low back pain radiating to left lower extremity for 2 years. History of surgery most recently in 2012   FINDINGS: Segmentation: Standard; the lowest formed disc space is designated L5-S1.   Alignment: There is trace retrolisthesis of  L4 on L5, unchanged.  Alignment is otherwise normal.   Vertebrae: Vertebral body heights are preserved. Marrow signal is normal. There is no suspicious marrow signal abnormality or marrow edema. Postsurgical changes reflecting left laminectomy at L4-L5 are noted.   Conus medullaris and cauda equina: Conus extends to the T12-L1 level. Conus and cauda equina appear normal.   Paraspinal and other soft tissues: A subcentimeter T2 hyperintense lesion in the left kidney lower pole likely reflects a small cyst, for which no specific imaging follow-up is required.   Disc levels:   T12-L1: No significant spinal canal or neural foraminal stenosis.   L1-L2: No significant spinal canal or neural foraminal stenosis   L2-L3: No significant spinal canal or neural foraminal stenosis   L3-L4: No significant spinal canal or neural foraminal stenosis   L4-L5: Status post left laminectomy. There is disc desiccation and narrowing with a mild bulge and mild bilateral facet arthropathy resulting in mild left and no significant right neural foraminal stenosis and no significant spinal canal stenosis. There is no evidence of nerve root impingement in the subarticular zones.   L5-S1: There is disc desiccation and narrowing with a mild bulge, degenerative endplate spurring, and mild bilateral facet arthropathy resulting in mild bilateral neural foraminal stenosis without significant spinal canal stenosis   The above findings are overall not significantly changed since 2020.   IMPRESSION: 1. Unchanged disc degeneration and mild facet arthropathy at L4-L5 and L5-S1 resulting in mild neural foraminal stenosis in the left at L4-L5 and bilaterally at L5-S1. No evidence of high-grade spinal canal or neural foraminal stenosis or nerve root impingement. 2. Overall, no significant interval change since 08/10/2019.  *MRI of the lumbar spine from 05/01/22 was independently reviewed and interpreted by Dr.  Ozell Ada on 11/29/22, showing DDD at L4/5 and L5/S1. No significant central or lateral recess stenosis. Mild L4/5 and L5/S1 foraminal stenosis on the left. Hemilaminotomy defects at L4/5 and L5/S1.   02/14/24 - MRI HEAD WITHOUT CONTRAST CLINICAL DATA:  Neuro deficit, acute, stroke suspected. Transient left eye visual loss. Multiple episodes since yesterday.   FINDINGS: Brain: No acute infarct or hemorrhage. No mass or midline shift. No hydrocephalus or extra-axial collection. No abnormal susceptibility.   Vascular: Normal flow voids.   Skull and upper cervical spine: Susceptibility artifact in the upper cervical spine from prior ACDF. Otherwise normal marrow signal.   Sinuses/Orbits: No acute findings.   Other: None.   IMPRESSION: No acute intracranial process.  PATIENT SURVEYS:  Modified Oswestry:  MODIFIED OSWESTRY DISABILITY SCALE Date:  09/21/2024   Pain intensity 3 =  Pain medication provides me with moderate relief from pain.  2. Personal care (washing, dressing, etc.) 3 =  I need help, but I am able to manage most of my personal care.  3. Lifting 3 = Pain prevents me from lifting heavy weights, but I can manage light to medium weights if they are conveniently positioned  4. Walking 2 =  Pain prevents me from walking more than  mile.  5. Sitting 4 =  Pain prevents me from sitting more than 10 minutes.  6. Standing 3 =  Pain prevents me from standing more than 1/2 hour.  7. Sleeping 4 =  Even when I take pain medication, I sleep less than 2 hour  8. Social Life 0 = My social life is normal and does not increase my pain.  9. Traveling 3 = My pain restricts my travel over 1 hour  10. Employment/ Homemaking 3 = Pain prevents  me from doing anything but light duties.  Total Score: 28/50  % Disability  56.0 % - severe   Interpretation of scores: Score Category Description  0-20% Minimal Disability The patient can cope with most living activities. Usually no treatment is  indicated apart from advice on lifting, sitting and exercise  21-40% Moderate Disability The patient experiences more pain and difficulty with sitting, lifting and standing. Travel and social life are more difficult and they may be disabled from work. Personal care, sexual activity and sleeping are not grossly affected, and the patient can usually be managed by conservative means  41-60% Severe Disability Pain remains the main problem in this group, but activities of daily living are affected. These patients require a detailed investigation  61-80% Crippled Back pain impinges on all aspects of the patient's life. Positive intervention is required  81-100% Bed-bound These patients are either bed-bound or exaggerating their symptoms  Bluford FORBES Zoe DELENA Karon DELENA, et al. Surgery versus conservative management of stable thoracolumbar fracture: the PRESTO feasibility RCT. [place] ([place]): [employer]; 2021 Nov. [facility] Technology Assessment, No. 25.62.) Appendix 3, Oswestry Disability Index category descriptors. Available from: Findjewelers.cz  Minimally Clinically Important Difference (MCID) = 12.8%  NDI:  NECK DISABILITY INDEX Date:  09/21/2024   Pain intensity 2 = The pain is moderate at the moment  2. Personal care (washing, dressing, etc.) 2 = It is painful to look after myself and I am slow and careful  3. Lifting 3 = Pain prevents me from lifting heavy weights but I can manage light to medium   weights if they are conveniently positioned  4. Reading 3 = I can't read as much as I want because of moderate pain in my neck  5. Headaches 1 =  I have slight headaches, which come infrequently  6. Concentration 0 =  I can concentrate fully when I want to with no difficulty  7. Work 3 =  I cannot do my usual work  8. Driving 3 = I can't drive my car as long as I want because of moderate pain in my neck  9. Sleeping 4 = My sleep is greatly disturbed (3-5 hrs  sleepless)   10. Recreation 4 =  I can hardly do any recreation activities because of pain in my neck  Total 25/50  % Disability  50.0 % - severe   Minimum Detectable Change (90% confidence): 5 points or 10% points  SCREENING FOR RED FLAGS: Bowel or bladder incontinence: Yes: urinary incontinence with h/o prolapse surgery in 07/2023 Spinal tumors: No Cauda equina syndrome: No Compression fracture: No Abdominal aneurysm: No  COGNITION:  Overall cognitive status: Within functional limits for tasks assessed    SENSATION: Intermittent numbness and tingling in L side of face, B hands and L LE  POSTURE:  rounded shoulders, forward head, decreased lumbar lordosis, increased thoracic kyphosis, and flexed trunk   PALPATION: Increased muscle tension and TTP in B cervical, thoracic and lumbar paraspinals, B UT and LS, B pecs  CERVICAL ROM:   Active ROM eval  Flexion 21  Extension 18  Right lateral flexion 17  Left lateral flexion 6*  Right rotation 23  Left rotation 11*   (Blank rows = not tested, *tingling into L side of face)  UPPER EXTREMITY ROM:  Active ROM Right eval Left eval  Shoulder flexion 106 101  Shoulder extension 21 19  Shoulder abduction 102 95  Shoulder adduction    Shoulder internal rotation L3 S1  Shoulder external rotation C7 C7  Elbow flexion    Elbow extension    Wrist flexion    Wrist extension    Wrist ulnar deviation    Wrist radial deviation    Wrist pronation    Wrist supination     (Blank rows = not tested)  UPPER EXTREMITY MMT:   MMT R 10/05/24 L 10/05/24  Shoulder flexion 4- p! (1) 4  Shoulder extension 4 p! (2) 4  Shoulder abduction 4- p! (1) 4  Shoulder adduction    Shoulder internal rotation 4 4  Shoulder external rotation 3+ p! (1) 4-  Middle trapezius    Lower trapezius    Elbow flexion    Elbow extension    Wrist flexion    Wrist extension    Wrist ulnar deviation    Wrist radial deviation    Wrist pronation    Wrist  supination    Grip strength     (Blank rows = not tested, p! = pain (1 = shoulder), (2 = midback))  LUMBAR ROM:   Active  Eval  Flexion Hands to lower thighs - motion mostly thoracic flexion  Extension 75% limited - motion mostly lumbar extension  Right lateral flexion Hand to 3 above knee - severe sharp pain in R UT  Left lateral flexion Hand to 3 above knee  Right rotation 75% limited  Left rotation 50% limited  (Blank rows = not tested)  MUSCLE LENGTH:  10/05/24 Hamstrings: mod/severe tight L, mod tight R ITB: mod tight B Piriformis: mod/severe tight L, mod tight R Hip flexors: mod/severe tight B Quads: mod tight B Heelcord:   LOWER EXTREMITY ROM:  10/05/24  Limited due to tightness as above and increased LBP  LOWER EXTREMITY MMT:    MMT R 10/05/24 L 10/05/24  Hip flexion 4- 4-  Hip extension 3- 3-  Hip abduction 3+ 3-  Hip adduction 3+ 3-  Hip internal rotation 4 4  Hip external rotation 4- 4-  Knee flexion 4 4  Knee extension 4 4  Ankle dorsiflexion 4- 4  Ankle plantarflexion    Ankle inversion    Ankle eversion     (Blank rows = not tested)  CERVICAL SPECIAL TESTS:  TBA as indicated  LUMBAR SPECIAL TESTS: 10/05/24 Straight leg raise test: Positive and Slump test: Positive  FUNCTIONAL TESTS:  10/05/24 5 times sit to stand: 41.31 sec - tendency noted for genu valgum, worsening with fatigue    TODAY'S TREATMENT:    10/05/24  THERAPEUTIC ACTIVITIES: To improve functional performance.  Demonstration, verbal and tactile cues throughout for technique. UE/LE MMT LE muscle length/flexibility assessment  THERAPEUTIC EXERCISE: To improve strength, ROM, and flexibility.  Demonstration, verbal and tactile cues throughout for technique.  Hooklying SKTC stretch x 30 B - increased pain reported Hooklying DKTC stretch x 30 - patient noting better tolerance as this is a stretch she does at home Hooklying L KTOS piriformis stretch x 30 Hooklying L figure-4  piriformis stretch with slight overpressure x 30 Hooklying L sciatic nerve glide x 5 LTR 2 x 10 - discontinued due to abandoning supine/hooklying activities due to increased  neck pain Seated L hip hinge HS stretch 2 x 30 Seated TrA isometric with hands on lower abdomen - patient having difficulty activating abdominal muscles without shrugging shoulders Seated TrA isometric with hands on folded pillow in lap 5 x 5 - informed patient this could also be done with small ball in her lap at home  09/21/2024 - Eval SELF CARE:  Reviewed eval findings and role of PT in addressing identified deficits as well as postural awareness and instruction in initial HEP (see below).   MANUAL THERAPY: To promote normalized muscle tension, improved flexibility, pain modulation, and reduced pain utilizing connective tissue massage, therapeutic massage, manual TP therapy, and myofascial release.  STM/DTM and manual TPR to address acute TP in R UT aggravated during lumbar ROM assessment  NEUROMUSCULAR RE-EDUCATION: To improve coordination, kinesthesia, and posture. VC and gentle TC/facilitation of cervical and scapular retraction to promote neutral posture for normalization of muscle tension   PATIENT EDUCATION:  Education details: PT eval findings, anticipated POC, need for further assessment of UE/LE MMT, lumbopelvic/LE flexiblity, and initial HEP  Person educated: Patient Education method: Explanation, Demonstration, Verbal cues, Tactile cues, and Handouts Education comprehension: verbalized understanding, returned demonstration, verbal cues required, tactile cues required, and needs further education  HOME EXERCISE PROGRAM: Access Code: [email or web address] URL: [email or web address] Date: 10/05/2024 Prepared by: Elijah Hidden  Exercises - Seated Cervical Retraction  - 2 x daily - 7 x weekly - 2 sets - 10 reps - 3-5 sec hold - Seated Scapular Retraction  - 2 x daily - 7 x weekly - 2 sets - 10 reps  - 3-5 sec hold - Standing Backward Shoulder Rolls  - 2 x daily - 7 x weekly - 2 sets - 10 reps - Seated Hamstring Stretch  - 2 x daily - 7 x weekly - 3 reps - 30 sec hold - Seated Abdominal Press into [employer]  - 1 x daily - 7 x weekly - 2 sets - 10 reps - 3 sec hold - Seated Transversus Abdominis Bracing  - 1 x daily - 7 x weekly - 2 sets - 10 reps - 3 sec hold  *2023 HEP MedBridge access Code: [email or web address]   ASSESSMENT:  CLINICAL IMPRESSION: Jaydeen returns to PT 2 weeks after initial evaluation reporting improving cervical ROM/flexibility, however noting decreased postural control due to decreasing muscle tension.  Completed UE and LE MMT along with LE flexibility assessment today with patient continuing to be limited by pain with most movements and activities.  Attempted to initiate supine/hooklying basic lumbopelvic stretches and core activation exercises but all activities causing increased neck pain even after addition of second pillow therefore transitioned to seated exercises,  however patient continuing to have difficulty with achieving desired muscle activity without significant compensatory movement.  Lynnea requesting to defer returning to PT until January however informed her that in order to remain active with her POC she needs to be seen within 30-day window, therefore she scheduled her next visit for 11/02/2024.  Mio will benefit from continued skilled PT to address ongoing flexibility, ROM, core/postural stability and strength deficits to improve mobility and activity tolerance with decreased pain interference.    EVAL: Brigitta Pricer is a 58 y.o. female who was referred to physical therapy for evaluation and treatment for fibromyalgia and chronic neck and thoracolumbar back pain.  She was a previously evaluated at this clinic in 2023 but requested to be discharged from PT after only 2 visits stating PT was not beneficial to her.  Patient reports onset of current exacerbation  of her pain beginning ~8 months ago with area of greatest concern being her neck pain and hand pain/numbness/tingling with feeling that her fingers lock up at times.  Neck and thoracolumbar pain are worse with turning head/neck, reaching down, prolonged sitting, vacuuming/mopping, and driving.  Patient has deficits in  cervical, thoracolumbar and B shoulder ROM; neck, shoulder and proximal LE (anticipated but not yet assessed) flexibility; B UE and LE strength (anticipated based on functional mobility, but not yet assessed), abnormal posture, and TTP with abnormal muscle tension in several muscles which are interfering with ADLs and are impacting quality of life.  On NDI patient scored 25/50 demonstrating 50% or severe disability.  On Modified Oswestry patient scored 28/50 demonstrating 56% or severe disability.  Pain and increased muscle spasms during assessment today limited the degree of assessment, with further assessment to be completed next visit.  Dillon will benefit from skilled PT to address above deficits to improve mobility and activity tolerance with decreased pain interference.  Given extent of body system involvement recommended 2x/wk frequency, however patient stating she can only manage 1x/wk.  She is aware that at 1x/wk, there will be a heavy emphasis on the HEP.  OBJECTIVE IMPAIRMENTS: decreased activity tolerance, decreased coordination, decreased knowledge of condition, decreased mobility, difficulty walking, decreased ROM, decreased strength, increased fascial restrictions, impaired perceived functional ability, increased muscle spasms, impaired flexibility, impaired UE functional use, improper body mechanics, postural dysfunction, and pain.   ACTIVITY LIMITATIONS: carrying, lifting, bending, sitting, standing, squatting, sleeping, transfers, bed mobility, bathing, dressing, reach over head, hygiene/grooming, locomotion level, and caring for others  PARTICIPATION LIMITATIONS: meal prep,  cleaning, laundry, driving, shopping, community activity, occupation, and yard work  PERSONAL FACTORS: Fitness, Past/current experiences, Time since onset of injury/illness/exacerbation, and 3+ comorbidities: Lumbar surgery x 3 including S1 laminotomy with R microdiskectomy performed in 07/2000 by Dr. Alix and left L4-5 discectomy in 10/2012 by Dr. Louis, C5-6 and C6-7 ACDF by Dr. 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   REHAB POTENTIAL: Good  CLINICAL DECISION MAKING: Unstable/unpredictable  EVALUATION COMPLEXITY: High   GOALS: Goals reviewed with patient? Yes  SHORT TERM GOALS: Target date: 11/02/2024  Patient will be independent with initial HEP to improve outcomes and carryover.  Baseline:  Goal status: IN PROGRESS - 10/05/24 - HEP updated to include LE flexibility and core muscle activation exercises as tolerated  2.  Patient will report 25% improvement in neck and thoracolumbar back pain to improve QOL. Baseline: Neck - 7/10, up to 10/10, Thoracolumbar - 5/10, up to 9-10/10 Goal status: IN PROGRESS - 10/05/24 - neck pain better today, however mid and lower back pain increased  LONG TERM GOALS: Target date: 12/14/2024  Patient will be independent with ongoing/advanced HEP for self-management at home.  Baseline:  Goal status: INITIAL  2.  Patient will report 50-75% improvement in neck and thoracolumbar back pain to improve QOL.  Baseline: Neck - 7/10, up to 10/10, Thoracolumbar - 5/10, up to 9-10/10 Goal status: INITIAL  3.  Patient will demonstrate improved posture to decrease muscle imbalance. Baseline: severe forward head,  rounded shoulders, increased thoracic kyphosis and decreased lumbar lordosis Goal status: INITIAL  4.  Patient to demonstrate ability to achieve and maintain good spinal alignment and body mechanics needed for daily activities. Baseline: increased pain with vacuuming and mopping, unable to lift Goal status: INITIAL  5.  Patient will demonstrate full pain free cervical ROM for safety with driving.  Baseline: Refer to above cervical ROM table Goal status: INITIAL  6.  Patient will demonstrate full pain free lumbar  ROM to perform ADLs.   Baseline: Refer to above lumbar ROM table Goal status: INITIAL  7.  Patient will demonstrate improved functional strength as demonstrated by improved B UE/LE strength to >/= 4/5. Baseline: Refer to above UE/LE MMT tables Goal status: INITIAL  8.  Patient will report </= 40% on NDI (MCID = 10%) to demonstrate improved functional ability.  Baseline: 25 / 50 = 50.0 % Goal status: INITIAL   9.  Patient will report </= 44% on Modified Oswestry (MCID = 12%) to demonstrate improved functional ability.  Baseline: 28 / 50 = 56.0 % Goal status: INITIAL  10.  Patient to report ability to perform ADLs, household, and work-related tasks without limitation due to neck or thoracolumbar pain >5/10, LOM or weakness. Baseline:  Goal status: INITIAL   11.  Patient report at least 50% reduction in sleep disturbance due to neck and/or thoracolumbar back pain. Baseline: pain limiting sleep to 1/4 of normal amount/disturbed up to 3-5 hrs Goal status: INITIAL   PLAN:  PT FREQUENCY: 1-2x/week  PT DURATION: 12 weeks  PLANNED INTERVENTIONS: 97164- PT Re-evaluation, 97750- Physical Performance Testing, 97110-Therapeutic exercises, 97530- Therapeutic activity, 97112- Neuromuscular re-education, 97535- Self Care, [id number]- Manual therapy, [phone]- Gait training, [phone]- Aquatic Therapy, [phone]- Electrical stimulation (unattended), [id number]- Ultrasound, [id number]- Ionotophoresis 4mg /ml  Dexamethasone , 79439 (1-2 muscles), 20561 (3+ muscles)- Dry Needling, Patient/Family education, Balance training, Taping, Joint mobilization, Spinal mobilization, Cryotherapy, and Moist heat  PLAN FOR NEXT SESSION: review and expand on initial HEP   Sloane Palmer M Dai Mcadams, PT 10/05/2024, 6:13 PM

## 2024-11-02 ENCOUNTER — Encounter: Payer: Self-pay | Admitting: Physical Therapy

## 2024-11-02 ENCOUNTER — Ambulatory Visit: Admitting: Physical Therapy

## 2024-11-02 DIAGNOSIS — R293 Abnormal posture: Secondary | ICD-10-CM

## 2024-11-02 DIAGNOSIS — M6281 Muscle weakness (generalized): Secondary | ICD-10-CM

## 2024-11-02 DIAGNOSIS — M542 Cervicalgia: Secondary | ICD-10-CM | POA: Diagnosis not present

## 2024-11-02 DIAGNOSIS — G8929 Other chronic pain: Secondary | ICD-10-CM

## 2024-11-02 DIAGNOSIS — M5413 Radiculopathy, cervicothoracic region: Secondary | ICD-10-CM

## 2024-11-02 DIAGNOSIS — M546 Pain in thoracic spine: Secondary | ICD-10-CM

## 2024-11-02 NOTE — Therapy (Signed)
 " OUTPATIENT PHYSICAL THERAPY TREATMENT   Patient Name: Virginia Shaw MRN: [id number] DOB:October 18, 1966, 58 y.o., female Today's Date: 11/02/2024  END OF SESSION:  PT End of Session - 11/02/24 1400     Visit Number 3    Date for Recertification  12/14/24    Authorization Type [employer]    Authorization Time Period 09/21/24 - 12/15/23    Authorization - Visit Number 3    Authorization - Number of Visits 16   52 units = ~16 visits   PT Start Time 1400    PT Stop Time 1448    PT Time Calculation (min) 48 min    Activity Tolerance Patient tolerated treatment well    Behavior During Therapy WFL for tasks assessed/performed            Past Medical History:  Diagnosis Date   Abnormal involuntary movements(781.0)    Allergic rhinitis, cause unspecified    Anxiety state, unspecified    Cervical spondylosis without myelopathy    Chronic headaches    Disturbance of skin sensation    Fatty liver    per patient, not sure but does remember being told   GERD (gastroesophageal reflux disease)    History of chicken pox    Irritable bowel syndrome    Low blood pressure    Lumbago    Myalgia and myositis, unspecified    Osteoporosis    Other and unspecified angina pectoris    Other malaise and fatigue    Peripheral vascular disease    per patient diagnosed with it about 13-14 years ago and does not currently see someone for it, saw Dr. 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   no coronary spasm per cardiology notes 2024   Shortness of breath on exertion    per patient, sometimes depends on distance   Sinus arrhythmia    per patient   Somatization disorder    Spasm of muscle    Stroke (HCC) 09/11/2022   Systemic sclerosis (HCC)    TIA (transient ischemic attack)    Unspecified hereditary and idiopathic peripheral neuropathy    Past Surgical History:  Procedure Laterality Date   ABDOMINAL HYSTERECTOMY   1997   ANTERIOR CERVICAL DECOMP/DISCECTOMY FUSION N/A 02/18/2023   Procedure: Anterior Cervical Decompression Fusion - Cervical four-Cervical five;  Surgeon: Louis Shove, MD;  Location: [facility] OR;  Service: Neurosurgery;  Laterality: N/A;  3C   arnold chiari malformation  2013   BACK SURGERY  2001, 2011,2013   Lumbar x 2   BALLOON DILATION N/A 12/22/2019   Procedure: BALLOON DILATION;  Surgeon: Dianna Specking, MD;  Location: [facility];  Service: Endoscopy;  Laterality: N/A;   barium swallow test  10/2019   bladder prolapse surgery  07/09/2023   at [facility] in [place] per patient   BLADDER SURGERY  2011   CARDIAC CATHETERIZATION     CARPAL TUNNEL RELEASE Left    CATARACT EXTRACTION     CERVICAL SPINE SURGERY  2013   CHOLECYSTECTOMY     COLONOSCOPY  2009   Normal Dr Jakie   COLONOSCOPY  2002   Normal Dr Marene   COLONOSCOPY  ~2013   Dr Zulema.  Chest pain with the prep = aborted   COLONOSCOPY WITH PROPOFOL  N/A 03/29/2020   Procedure: COLONOSCOPY WITH PROPOFOL ;  Surgeon: Dianna Specking, MD;  Location: [facility];  Service: Endoscopy;  Laterality: N/A;   CRANIECTOMY  SUBOCCIPITAL W/ CERVICAL LAMINECTOMY / CHIARI  2012   Dr Louis, Chiari decompression with suboccipital craniectomy and   CYSTOCELE REPAIR  2002   Dr Curlene   ESOPHAGOGASTRODUODENOSCOPY (EGD) WITH PROPOFOL  N/A 12/22/2019   Procedure: ESOPHAGOGASTRODUODENOSCOPY (EGD) WITH PROPOFOL ;  Surgeon: Dianna Specking, MD;  Location: [facility];  Service: Endoscopy;  Laterality: N/A;   INSERTION OF MESH Bilateral 10/04/2020   Procedure: INSERTION OF MESH;  Surgeon: Rubin Calamity, MD;  Location: [facility] OR;  Service: General;  Laterality: Bilateral;   OOPHORECTOMY  1999, 2000   PITUITARY EXCISION  2010   pituitary cyst removed   RECTOCELE REPAIR  2002   Dr. Curlene   RENAL BIOPSY  08/04/2019   tear duct tubes  2005   TUBAL LIGATION     VIDEO BRONCHOSCOPY N/A 05/14/2022   Procedure: VIDEO  BRONCHOSCOPY WITHOUT FLUORO;  Surgeon: Theophilus Roosevelt, MD;  Location: [facility];  Service: Cardiopulmonary;  Laterality: N/A;   XI ROBOTIC ASSISTED INGUINAL HERNIA REPAIR WITH MESH Bilateral 10/04/2020   Procedure: XI ROBOTIC ASSISTED BILATERAL INGUINAL HERNIA REPAIR WITH MESH;  Surgeon: Rubin Calamity, MD;  Location: [facility] OR;  Service: General;  Laterality: Bilateral;   Patient Active Problem List   Diagnosis Date Noted   Acute sinusitis 06/18/2023   Cervical spondylosis with myelopathy and radiculopathy 02/18/2023   Reactive airway disease 01/23/2023   Preoperative respiratory examination 01/23/2023   Hemoptysis 01/05/2022   Special screening for malignant neoplasms, colon 03/29/2020   Dysphagia 12/22/2019   DDD (degenerative disc disease), cervical 08/11/2018   DDD (degenerative disc disease), lumbar 08/11/2018   Somatic complaints, multiple 03/14/2017   Chronic pain syndrome 08/25/2014   Irritable bowel syndrome (IBS) 01/12/2014   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   Chronic abdominal pain 01/11/2014   Chronic venous insufficiency 10/30/2013   Varicose veins 10/30/2013   Chest tightness 03/25/2012   PITUITARY ADENOMA, 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   RECTAL BLEEDING 08/26/2008   UTI'S, RECURRENT 08/26/2008    PCP: Claudene Pellet, MD   REFERRING PROVIDER: Dolphus Reiter, MD   REFERRING DIAG:  M79.7 (ICD-10-CM) - Fibromyalgia  M50.30 (ICD-10-CM) - DDD (degenerative disc disease), cervical  M54.6 (ICD-10-CM) - Pain in thoracic spine  M54.16 (ICD-10-CM) - Radiculopathy, lumbar region   THERAPY DIAG:  Cervicalgia  Radiculopathy, cervicothoracic region  Pain in thoracic spine  Chronic left-sided low back pain with left-sided sciatica  Abnormal posture  Muscle weakness (generalized)  RATIONALE FOR EVALUATION AND TREATMENT: Rehabilitation  ONSET DATE: Chronic, exacerbation over the last ~8 months  NEXT MD VISIT: 09/02/25; 57-month check-up with neurosurgeon in Dec  2025   SUBJECTIVE:  SUBJECTIVE STATEMENT: Pt reports her pain still wakes her up at times - worst in L lower back and hip. Tries to get to her HEP at least at least 1x/day.  She reports she has been having issues with rectal bleeding after performing her abdominal bracing exercises on some occasions - plans to call her gastroenterologist about this tomorrow.  EVAL: Pt reports her neck and hands are the biggest issue right now - disturbing her sleep. Also has chronic upper and lower back pain.  She reports intermittent but daily numbness and tingling in L side of face, B hands and L LE.  When she tries to hold an object for a while, her fingers lock up, worse in winter than summer.  Her issues are chronic with current exacerbation occurring over the last 8 months.  Last neck surgery was in 02/2023.   PAIN: Are you having pain? Yes: NPRS scale: 5/10   Pain location: L neck- upper under occiput down to shoulder  Pain description: constant dull ache, occasional pressure or intermittent sharp, stabbing pain above L ear Aggravating factors: turning head/neck, vacuuming/mopping, driving  Relieving factors: rubbing, ice, lying down in comfortable position   Are you having pain? Yes: NPRS scale: 5/10   Pain location: midline B thoracic spine between scapula radiating laterally along L lower scapula and up to L neck Pain description: tightness, squeezing; occasional brief sharp, stabbing pain  Aggravating factors: varies, reaching down to put shoes on, turning  Relieving factors: stretch and rest, let it run its course   Are you having pain? Yes: NPRS scale: 7-8/10   Pain location: L lower back, extending into L hip, groin and LE to L calf/ankle Pain description: sharp at times; dull, pressure  Aggravating  factors: prolonged sitting  Relieving factors: moving around   PERTINENT HISTORY:  Lumbar surgery x 3 including S1 laminotomy with R microdiskectomy performed in 07/2000 by Dr. Alix and left L4-5 discectomy in 10/2012 by Dr. Louis, C5-6 and C6-7 ACDF by Dr. Louis in 01/2012, cervical and lumbar DDD, cervical spondylosis with myelopathy and radiculopathy, Chiari malformation decompression surgery 11/2008, fibromyalgia, osteoporosis, osteoarthritis, h/o TIA, CAD, angina, GERD, IBS, CKD, idiopathic peripheral neuropathy, chronic pain, chronic headaches anxiety, systemic sclerosis, somatization disorder  PRECAUTIONS: None  RED FLAGS: Bowel or bladder incontinence: Yes: urinary incontinence with h/o prolapse surgery in 07/2023  HAND DOMINANCE: Right  WEIGHT BEARING RESTRICTIONS: No  FALLS:  Has patient fallen in last 6 months? No  LIVING ENVIRONMENT: Lives with: lives with their daughter Lives in: House Stairs: Yes: External: 3 steps; fence on L Has following equipment at home: Single point cane and Walker - 2 wheeled (not currently used)  OCCUPATION: On disability  PLOF: Independent with basic ADLs, Independent with household mobility without device, Independent with community mobility without device, Needs assistance with homemaking, and Leisure: time with daughters and grandbabies   PATIENT GOALS: Better mobility, less pain, be able to do more with my kids and grandson. I would love to be able to work.   OBJECTIVE: (objective measures completed at initial evaluation unless otherwise dated)  DIAGNOSTIC FINDINGS:  11/29/22 - XR lumbar spine complete: Independently reviewed and interpreted by Dr. Ozell Ada, showing  disc height loss at L4/5 and L5/S1.  No fracture or dislocation.  No evidence of instability on flexion/extension views.   05/01/22 - MRI LUMBAR SPINE WITHOUT CONTRAST CLINICAL DATA:  Low back pain radiating to left lower extremity for 2 years. History of surgery most  recently in  2012   FINDINGS: Segmentation: Standard; the lowest formed disc space is designated L5-S1.   Alignment: There is trace retrolisthesis of L4 on L5, unchanged. Alignment is otherwise normal.   Vertebrae: Vertebral body heights are preserved. Marrow signal is normal. There is no suspicious marrow signal abnormality or marrow edema. Postsurgical changes reflecting left laminectomy at L4-L5 are noted.   Conus medullaris and cauda equina: Conus extends to the T12-L1 level. Conus and cauda equina appear normal.   Paraspinal and other soft tissues: A subcentimeter T2 hyperintense lesion in the left kidney lower pole likely reflects a small cyst, for which no specific imaging follow-up is required.   Disc levels:   T12-L1: No significant spinal canal or neural foraminal stenosis.   L1-L2: No significant spinal canal or neural foraminal stenosis   L2-L3: No significant spinal canal or neural foraminal stenosis   L3-L4: No significant spinal canal or neural foraminal stenosis   L4-L5: Status post left laminectomy. There is disc desiccation and narrowing with a mild bulge and mild bilateral facet arthropathy resulting in mild left and no significant right neural foraminal stenosis and no significant spinal canal stenosis. There is no evidence of nerve root impingement in the subarticular zones.   L5-S1: There is disc desiccation and narrowing with a mild bulge, degenerative endplate spurring, and mild bilateral facet arthropathy resulting in mild bilateral neural foraminal stenosis without significant spinal canal stenosis   The above findings are overall not significantly changed since 2020.   IMPRESSION: 1. Unchanged disc degeneration and mild facet arthropathy at L4-L5 and L5-S1 resulting in mild neural foraminal stenosis in the left at L4-L5 and bilaterally at L5-S1. No evidence of high-grade spinal canal or neural foraminal stenosis or nerve root impingement. 2.  Overall, no significant interval change since 08/10/2019.  *MRI of the lumbar spine from 05/01/22 was independently reviewed and interpreted by Dr. Ozell Ada on 11/29/22, showing DDD at L4/5 and L5/S1. No significant central or lateral recess stenosis. Mild L4/5 and L5/S1 foraminal stenosis on the left. Hemilaminotomy defects at L4/5 and L5/S1.   02/14/24 - MRI HEAD WITHOUT CONTRAST CLINICAL DATA:  Neuro deficit, acute, stroke suspected. Transient left eye visual loss. Multiple episodes since yesterday.   FINDINGS: Brain: No acute infarct or hemorrhage. No mass or midline shift. No hydrocephalus or extra-axial collection. No abnormal susceptibility.   Vascular: Normal flow voids.   Skull and upper cervical spine: Susceptibility artifact in the upper cervical spine from prior ACDF. Otherwise normal marrow signal.   Sinuses/Orbits: No acute findings.   Other: None.   IMPRESSION: No acute intracranial process.  PATIENT SURVEYS:  Modified Oswestry:  MODIFIED OSWESTRY DISABILITY SCALE Date:  09/21/2024   Pain intensity 3 =  Pain medication provides me with moderate relief from pain.  2. Personal care (washing, dressing, etc.) 3 =  I need help, but I am able to manage most of my personal care.  3. Lifting 3 = Pain prevents me from lifting heavy weights, but I can manage light to medium weights if they are conveniently positioned  4. Walking 2 =  Pain prevents me from walking more than  mile.  5. Sitting 4 =  Pain prevents me from sitting more than 10 minutes.  6. Standing 3 =  Pain prevents me from standing more than 1/2 hour.  7. Sleeping 4 =  Even when I take pain medication, I sleep less than 2 hour  8. Social Life 0 = My social life is normal and  does not increase my pain.  9. Traveling 3 = My pain restricts my travel over 1 hour  10. Employment/ Homemaking 3 = Pain prevents me from doing anything but light duties.  Total Score: 28/50  % Disability  56.0 % - severe    Interpretation of scores: Score Category Description  0-20% Minimal Disability The patient can cope with most living activities. Usually no treatment is indicated apart from advice on lifting, sitting and exercise  21-40% Moderate Disability The patient experiences more pain and difficulty with sitting, lifting and standing. Travel and social life are more difficult and they may be disabled from work. Personal care, sexual activity and sleeping are not grossly affected, and the patient can usually be managed by conservative means  41-60% Severe Disability Pain remains the main problem in this group, but activities of daily living are affected. These patients require a detailed investigation  61-80% Crippled Back pain impinges on all aspects of the patients life. Positive intervention is required  81-100% Bed-bound These patients are either bed-bound or exaggerating their symptoms  Bluford FORBES Zoe DELENA Karon DELENA, et al. Surgery versus conservative management of stable thoracolumbar fracture: the PRESTO feasibility RCT. [place] ([place]): [employer]; 2021 Nov. [facility] Technology Assessment, No. 25.62.) Appendix 3, Oswestry Disability Index category descriptors. Available from: Findjewelers.cz  Minimally Clinically Important Difference (MCID) = 12.8%  NDI:  NECK DISABILITY INDEX Date:  09/21/2024   Pain intensity 2 = The pain is moderate at the moment  2. Personal care (washing, dressing, etc.) 2 = It is painful to look after myself and I am slow and careful  3. Lifting 3 = Pain prevents me from lifting heavy weights but I can manage light to medium   weights if they are conveniently positioned  4. Reading 3 = I can't read as much as I want because of moderate pain in my neck  5. Headaches 1 =  I have slight headaches, which come infrequently  6. Concentration 0 =  I can concentrate fully when I want to with no difficulty  7. Work 3 =  I cannot do my  usual work  8. Driving 3 = I can't drive my car as long as I want because of moderate pain in my neck  9. Sleeping 4 = My sleep is greatly disturbed (3-5 hrs sleepless)   10. Recreation 4 =  I can hardly do any recreation activities because of pain in my neck  Total 25/50  % Disability  50.0 % - severe   Minimum Detectable Change (90% confidence): 5 points or 10% points  SCREENING FOR RED FLAGS: Bowel or bladder incontinence: Yes: urinary incontinence with h/o prolapse surgery in 07/2023 Spinal tumors: No Cauda equina syndrome: No Compression fracture: No Abdominal aneurysm: No  COGNITION:  Overall cognitive status: Within functional limits for tasks assessed    SENSATION: Intermittent numbness and tingling in L side of face, B hands and L LE  POSTURE:  rounded shoulders, forward head, decreased lumbar lordosis, increased thoracic kyphosis, and flexed trunk   PALPATION: Increased muscle tension and TTP in B cervical, thoracic and lumbar paraspinals, B UT and LS, B pecs  CERVICAL ROM:   Active ROM eval  Flexion 21  Extension 18  Right lateral flexion 17  Left lateral flexion 6*  Right rotation 23  Left rotation 11*   (Blank rows = not tested, *tingling into L side of face)  UPPER EXTREMITY ROM:  Active ROM Right eval Left eval  Shoulder flexion 106 101  Shoulder extension 21 19  Shoulder abduction 102 95  Shoulder adduction    Shoulder internal rotation L3 S1  Shoulder external rotation C7 C7  Elbow flexion    Elbow extension    Wrist flexion    Wrist extension    Wrist ulnar deviation    Wrist radial deviation    Wrist pronation    Wrist supination     (Blank rows = not tested)  UPPER EXTREMITY MMT:   MMT R 10/05/24 L 10/05/24  Shoulder flexion 4- p! (1) 4  Shoulder extension 4 p! (2) 4  Shoulder abduction 4- p! (1) 4  Shoulder adduction    Shoulder internal rotation 4 4  Shoulder external rotation 3+ p! (1) 4-  Middle trapezius    Lower trapezius     Elbow flexion    Elbow extension    Wrist flexion    Wrist extension    Wrist ulnar deviation    Wrist radial deviation    Wrist pronation    Wrist supination    Grip strength     (Blank rows = not tested, p! = pain (1 = shoulder), (2 = midback))  LUMBAR ROM:   Active  Eval  Flexion Hands to lower thighs - motion mostly thoracic flexion  Extension 75% limited - motion mostly lumbar extension  Right lateral flexion Hand to 3 above knee - severe sharp pain in R UT  Left lateral flexion Hand to 3 above knee  Right rotation 75% limited  Left rotation 50% limited  (Blank rows = not tested)  MUSCLE LENGTH:  10/05/24 Hamstrings: mod/severe tight L, mod tight R ITB: mod tight B Piriformis: mod/severe tight L, mod tight R Hip flexors: mod/severe tight B Quads: mod tight B Heelcord:   LOWER EXTREMITY ROM:  10/05/24  Limited due to tightness as above and increased LBP  LOWER EXTREMITY MMT:    MMT R 10/05/24 L 10/05/24  Hip flexion 4- 4-  Hip extension 3- 3-  Hip abduction 3+ 3-  Hip adduction 3+ 3-  Hip internal rotation 4 4  Hip external rotation 4- 4-  Knee flexion 4 4  Knee extension 4 4  Ankle dorsiflexion 4- 4  Ankle plantarflexion    Ankle inversion    Ankle eversion     (Blank rows = not tested)  CERVICAL SPECIAL TESTS:  TBA as indicated  LUMBAR SPECIAL TESTS: 10/05/24 Straight leg raise test: Positive and Slump test: Positive  FUNCTIONAL TESTS:  10/05/24 5 times sit to stand: 41.31 sec - tendency noted for genu valgum, worsening with fatigue    TODAY'S TREATMENT:    11/02/2024  THERAPEUTIC EXERCISE: To improve strength, endurance, ROM, and flexibility.  Demonstration, verbal and tactile cues throughout for technique.  Treadmill - 0.9 mph x 6 min Seated TrA isometric with hands on lower abdomen 10 x 5  Seated TrA isometric with hands on small ball in lap 10 x 5  Seated L hip hinge HS stretch 2 x 30  NEUROMUSCULAR RE-EDUCATION: To improve  coordination, kinesthesia, posture, and proprioception. Seated TrA + B hip ADD isometric ball squeeze 10 x 5 Alt unilateral RTB hip ABD/ER clam x 10 bil - isolated individual R/L due to feeling of asymmetrical muscle activation Alt RTB march x 10 Seated TrA + scap retraction +  YTB B shoulder ER x 10 - cues to keep elbows at sides Standing TrA + scap retraction +  YTB B shoulder row x 10 (  core handles on YTB) - cues to avoid shoulder shrug YTB B shoulder extension x 10 (core handles on YTB) - cues to avoid forward head and focus on cervical retraction   10/05/24  THERAPEUTIC ACTIVITIES: To improve functional performance.  Demonstration, verbal and tactile cues throughout for technique. UE/LE MMT LE muscle length/flexibility assessment  THERAPEUTIC EXERCISE: To improve strength, ROM, and flexibility.  Demonstration, verbal and tactile cues throughout for technique.  Hooklying SKTC stretch x 30 B - increased pain reported Hooklying DKTC stretch x 30 - patient noting better tolerance as this is a stretch she does at home Hooklying L KTOS piriformis stretch x 30 Hooklying L figure-4 piriformis stretch with slight overpressure x 30 Hooklying L sciatic nerve glide x 5 LTR 2 x 10 - discontinued due to abandoning supine/hooklying activities due to increased neck pain Seated L hip hinge HS stretch 2 x 30 Seated TrA isometric with hands on lower abdomen - patient having difficulty activating abdominal muscles without shrugging shoulders Seated TrA isometric with hands on folded pillow in lap 5 x 5 - informed patient this could also be done with small ball in her lap at home   09/21/2024 - Eval SELF CARE:  Reviewed eval findings and role of PT in addressing identified deficits as well as postural awareness and instruction in initial HEP (see below).   MANUAL THERAPY: To promote normalized muscle tension, improved flexibility, pain modulation, and reduced pain utilizing connective tissue  massage, therapeutic massage, manual TP therapy, and myofascial release.  STM/DTM and manual TPR to address acute TP in R UT aggravated during lumbar ROM assessment  NEUROMUSCULAR RE-EDUCATION: To improve coordination, kinesthesia, and posture. VC and gentle TC/facilitation of cervical and scapular retraction to promote neutral posture for normalization of muscle tension   PATIENT EDUCATION:  Education details: HEP review, HEP progression, and postural awareness  Person educated: Patient Education method: Explanation, Demonstration, Verbal cues, Tactile cues, and Handouts Education comprehension: verbalized understanding, returned demonstration, verbal cues required, tactile cues required, and needs further education  HOME EXERCISE PROGRAM: Access Code: [email or web address] URL: [email or web address] Date: 10/05/2024 Prepared by: Elijah Hidden  Exercises - Seated Cervical Retraction  - 2 x daily - 7 x weekly - 2 sets - 10 reps - 3-5 sec hold - Seated Scapular Retraction  - 2 x daily - 7 x weekly - 2 sets - 10 reps - 3-5 sec hold - Standing Backward Shoulder Rolls  - 2 x daily - 7 x weekly - 2 sets - 10 reps - Seated Hamstring Stretch  - 2 x daily - 7 x weekly - 3 reps - 30 sec hold - Seated Abdominal Press into [employer]  - 1 x daily - 7 x weekly - 2 sets - 10 reps - 3 sec hold - Seated Transversus Abdominis Bracing  - 1 x daily - 7 x weekly - 2 sets - 10 reps - 3 sec hold  *2023 HEP MedBridge access Code: [email or web address]   ASSESSMENT:  CLINICAL IMPRESSION: Virginia Shaw reports no concerns with the HEP exercises themselves, but states she has experienced rectal bleeding on a few occasions after performing abdominal bracing exercises - she denies Valsalva or bearing down with exercises, nor does she have a history of hemorrhoids.  She returns after a 4-week absence from PT and does not intend to return again for another 3 weeks, hence session focusing on HEP review and update due to limited in  person visits.  Progressed exercises to include some light resisted postural  and lumbopelvic strengthening exercises in sitting and standing, however only added a few of these to HEP due to need for cueing for proper technique and/or c/o increased discomfort during performance.  Virginia Shaw will benefit from continued skilled PT to address ongoing flexibility, ROM, core/postural stability and strength deficits to improve mobility and activity tolerance with decreased pain interference.    EVAL: Virginia Shaw is a 58 y.o. female who was referred to physical therapy for evaluation and treatment for fibromyalgia and chronic neck and thoracolumbar back pain.  She was a previously evaluated at this clinic in 2023 but requested to be discharged from PT after only 2 visits stating PT was not beneficial to her.  Patient reports onset of current exacerbation of her pain beginning ~8 months ago with area of greatest concern being her neck pain and hand pain/numbness/tingling with feeling that her fingers lock up at times.  Neck and thoracolumbar pain are worse with turning head/neck, reaching down, prolonged sitting, vacuuming/mopping, and driving.  Patient has deficits in cervical, thoracolumbar and B shoulder ROM; neck, shoulder and proximal LE (anticipated but not yet assessed) flexibility; B UE and LE strength (anticipated based on functional mobility, but not yet assessed), abnormal posture, and TTP with abnormal muscle tension in several muscles which are interfering with ADLs and are impacting quality of life.  On NDI patient scored 25/50 demonstrating 50% or severe disability.  On Modified Oswestry patient scored 28/50 demonstrating 56% or severe disability.  Pain and increased muscle spasms during assessment today limited the degree of assessment, with further assessment to be completed next visit.  Virginia Shaw will benefit from skilled PT to address above deficits to improve mobility and activity tolerance with  decreased pain interference.  Given extent of body system involvement recommended 2x/wk frequency, however patient stating she can only manage 1x/wk.  She is aware that at 1x/wk, there will be a heavy emphasis on the HEP.  OBJECTIVE IMPAIRMENTS: decreased activity tolerance, decreased coordination, decreased knowledge of condition, decreased mobility, difficulty walking, decreased ROM, decreased strength, increased fascial restrictions, impaired perceived functional ability, increased muscle spasms, impaired flexibility, impaired UE functional use, improper body mechanics, postural dysfunction, and pain.   ACTIVITY LIMITATIONS: carrying, lifting, bending, sitting, standing, squatting, sleeping, transfers, bed mobility, bathing, dressing, reach over head, hygiene/grooming, locomotion level, and caring for others  PARTICIPATION LIMITATIONS: meal prep, cleaning, laundry, driving, shopping, community activity, occupation, and yard work  PERSONAL FACTORS: Fitness, Past/current experiences, Time since onset of injury/illness/exacerbation, and 3+ comorbidities: Lumbar surgery x 3 including S1 laminotomy with R microdiskectomy performed in 07/2000 by Dr. Alix and left L4-5 discectomy in 10/2012 by Dr. Louis, C5-6 and C6-7 ACDF by Dr. 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   REHAB POTENTIAL: Good  CLINICAL DECISION MAKING: Unstable/unpredictable  EVALUATION COMPLEXITY: High   GOALS: Goals reviewed with patient? Yes  SHORT TERM GOALS: Target date: 11/02/2024  Patient will be independent with initial HEP to improve outcomes and carryover.  Baseline:   10/05/24 - HEP updated to include LE flexibility and core muscle activation exercises as tolerated Goal status: MET - 11/02/24 - no concerns with the exercises themselves, and reports performing HEP most days  2.  Patient will report 25% improvement in neck and thoracolumbar back pain to improve  QOL. Baseline: Neck - 7/10, up to 10/10, Thoracolumbar - 5/10, up to 9-10/10 10/05/24 - neck pain better today, however mid and lower back pain increased Goal status: PARTIALLY MET - 11/02/24 - met for neck and low back, however thoracic spine demonstrating less improvement  LONG TERM GOALS: Target date: 12/14/2024  Patient will be independent with ongoing/advanced HEP for self-management at home.  Baseline:  Goal status: INITIAL  2.  Patient will report 50-75% improvement in neck and thoracolumbar back pain to improve QOL.  Baseline: Neck - 7/10, up to 10/10, Thoracolumbar - 5/10, up to 9-10/10 Goal status: INITIAL  3.  Patient will demonstrate improved posture to decrease muscle imbalance. Baseline: severe forward head, rounded shoulders, increased thoracic kyphosis and decreased lumbar lordosis Goal status: INITIAL  4.  Patient to demonstrate ability to achieve and maintain good spinal alignment and body mechanics needed for daily activities. Baseline: increased pain with vacuuming and mopping, unable to lift Goal status: INITIAL  5.  Patient will demonstrate full pain free cervical ROM for safety with driving.  Baseline: Refer to above cervical ROM table Goal status: INITIAL  6.  Patient will demonstrate full pain free lumbar ROM to perform ADLs.   Baseline: Refer to above lumbar ROM table Goal status: INITIAL  7.  Patient will demonstrate improved functional strength as demonstrated by improved B UE/LE strength to >/= 4/5. Baseline: Refer to above UE/LE MMT tables Goal status: INITIAL  8.  Patient will report </= 40% on NDI (MCID = 10%) to demonstrate improved functional ability.   Baseline: 25 / 50 = 50.0 % Goal status: INITIAL   9.  Patient will report </= 44% on Modified Oswestry (MCID = 12%) to demonstrate improved functional ability.  Baseline: 28 / 50 = 56.0 % Goal status: INITIAL  10.  Patient to report ability to perform ADLs, household, and work-related tasks without limitation due to neck or thoracolumbar pain >5/10, LOM or weakness. Baseline:  Goal status: INITIAL   11.  Patient report at least 50% reduction in sleep disturbance due to neck and/or thoracolumbar back pain. Baseline: pain limiting sleep to 1/4 of normal amount/disturbed up to 3-5 hrs Goal status: INITIAL   PLAN:  PT FREQUENCY: 1-2x/week  PT DURATION: 12 weeks  PLANNED INTERVENTIONS: 97164- PT Re-evaluation, 97750- Physical Performance Testing, 97110-Therapeutic exercises, 97530- Therapeutic activity, 97112- Neuromuscular re-education, 97535- Self Care, 02859- Manual therapy, [phone]- Gait training, [phone]- Aquatic Therapy, [phone]- Electrical stimulation (unattended), [id number]- Ultrasound, [id number]- Ionotophoresis 4mg /ml Dexamethasone , 79439 (1-2 muscles), 20561 (3+ muscles)- Dry Needling, Patient/Family education, Balance training, Taping, Joint mobilization, Spinal mobilization, Cryotherapy, and Moist heat  PLAN FOR NEXT SESSION: review and expand on HEP with further cervical, postural, core and and lumbopelvic flexibility and strengthening   Elijah CHRISTELLA Hidden, PT 11/02/2024, 3:00 PM  "

## 2024-11-23 ENCOUNTER — Ambulatory Visit: Admitting: Physical Therapy

## 2024-11-25 ENCOUNTER — Telehealth: Payer: Self-pay

## 2024-11-25 NOTE — Telephone Encounter (Signed)
 Costochondritis can be followed by her PCP.  If she is not having any other symptoms then there is no need for any further evaluation at this time.

## 2024-11-25 NOTE — Telephone Encounter (Signed)
 Patient advised Costochondritis can be followed by her PCP. If she is not having any other symptoms then there is no need for any further evaluation at this time. Patient verbalized understanding.

## 2024-11-25 NOTE — Telephone Encounter (Signed)
 Patient contacted the office and states she was going to physical therapy but has now stopped. Patient states she is flaring with pain in her chest and neck. Patient states she went to her medical doctor yesterday and and was advised they believed it was costochondritis. Patient states she was given some prednisone  and had started that yesterday. Patient states she has had this flare for at least two weeks. Patient inquires what she should do and if she needs to be seen and make an appointment. Please advise.

## 2024-11-27 ENCOUNTER — Ambulatory Visit: Admitting: Physical Therapy

## 2024-11-27 ENCOUNTER — Other Ambulatory Visit: Payer: Self-pay | Admitting: Neurosurgery

## 2024-11-27 DIAGNOSIS — M5416 Radiculopathy, lumbar region: Secondary | ICD-10-CM

## 2024-12-16 ENCOUNTER — Other Ambulatory Visit

## 2025-09-02 ENCOUNTER — Ambulatory Visit: Admitting: Rheumatology
